# Patient Record
Sex: Female | Born: 1945
Health system: Southern US, Community
[De-identification: ages and names within clinical notes are randomized; demographics above are authoritative.]

## PROBLEM LIST (undated history)

## (undated) DIAGNOSIS — F329 Major depressive disorder, single episode, unspecified: Secondary | ICD-10-CM

## (undated) DIAGNOSIS — E782 Mixed hyperlipidemia: Secondary | ICD-10-CM

## (undated) DIAGNOSIS — H9190 Unspecified hearing loss, unspecified ear: Secondary | ICD-10-CM

## (undated) DIAGNOSIS — R2689 Other abnormalities of gait and mobility: Secondary | ICD-10-CM

## (undated) DIAGNOSIS — H8109 Meniere's disease, unspecified ear: Secondary | ICD-10-CM

## (undated) DIAGNOSIS — L309 Dermatitis, unspecified: Secondary | ICD-10-CM

## (undated) DIAGNOSIS — F419 Anxiety disorder, unspecified: Secondary | ICD-10-CM

## (undated) DIAGNOSIS — R2681 Unsteadiness on feet: Secondary | ICD-10-CM

## (undated) DIAGNOSIS — R0600 Dyspnea, unspecified: Secondary | ICD-10-CM

## (undated) DIAGNOSIS — M810 Age-related osteoporosis without current pathological fracture: Secondary | ICD-10-CM

## (undated) DIAGNOSIS — I10 Essential (primary) hypertension: Secondary | ICD-10-CM

## (undated) DIAGNOSIS — K219 Gastro-esophageal reflux disease without esophagitis: Secondary | ICD-10-CM

## (undated) DIAGNOSIS — Z9621 Cochlear implant status: Secondary | ICD-10-CM

## (undated) DIAGNOSIS — F319 Bipolar disorder, unspecified: Secondary | ICD-10-CM

## (undated) DIAGNOSIS — F32A Depression, unspecified: Secondary | ICD-10-CM

## (undated) DIAGNOSIS — E876 Hypokalemia: Secondary | ICD-10-CM

## (undated) DIAGNOSIS — Z974 Presence of external hearing-aid: Secondary | ICD-10-CM

## (undated) DIAGNOSIS — M1712 Unilateral primary osteoarthritis, left knee: Secondary | ICD-10-CM

## (undated) DIAGNOSIS — D62 Acute posthemorrhagic anemia: Secondary | ICD-10-CM

## (undated) DIAGNOSIS — M1711 Unilateral primary osteoarthritis, right knee: Secondary | ICD-10-CM

## (undated) DIAGNOSIS — M797 Fibromyalgia: Secondary | ICD-10-CM

## (undated) DIAGNOSIS — K5901 Slow transit constipation: Secondary | ICD-10-CM

## (undated) DIAGNOSIS — J189 Pneumonia, unspecified organism: Secondary | ICD-10-CM

## (undated) DIAGNOSIS — R7303 Prediabetes: Secondary | ICD-10-CM

## (undated) DIAGNOSIS — IMO0001 Reserved for inherently not codable concepts without codable children: Secondary | ICD-10-CM

## (undated) DIAGNOSIS — M1990 Unspecified osteoarthritis, unspecified site: Secondary | ICD-10-CM

## (undated) HISTORY — DX: Other abnormalities of gait and mobility: R26.89

## (undated) HISTORY — PX: COCHLEAR IMPLANT: SUR684

## (undated) HISTORY — DX: Anxiety disorder, unspecified: F41.9

## (undated) HISTORY — DX: Major depressive disorder, single episode, unspecified: F32.9

## (undated) HISTORY — DX: Presence of external hearing-aid: Z97.4

## (undated) HISTORY — DX: Depression, unspecified: F32.A

## (undated) HISTORY — DX: Reserved for inherently not codable concepts without codable children: IMO0001

## (undated) HISTORY — DX: Meniere's disease, unspecified ear: H81.09

## (undated) HISTORY — DX: Age-related osteoporosis without current pathological fracture: M81.0

## (undated) HISTORY — DX: Hypokalemia: E87.6

## (undated) HISTORY — DX: Unspecified hearing loss, unspecified ear: H91.90

## (undated) HISTORY — PX: COLONOSCOPY: SHX174

## (undated) HISTORY — DX: Unspecified osteoarthritis, unspecified site: M19.90

## (undated) HISTORY — DX: Unsteadiness on feet: R26.81

## (undated) HISTORY — DX: Acute posthemorrhagic anemia: D62

## (undated) HISTORY — DX: Slow transit constipation: K59.01

## (undated) HISTORY — DX: Gastro-esophageal reflux disease without esophagitis: K21.9

## (undated) HISTORY — DX: Mixed hyperlipidemia: E78.2

## (undated) HISTORY — DX: Essential (primary) hypertension: I10

---

## 1984-10-19 HISTORY — PX: ABDOMINAL HYSTERECTOMY: SHX81

## 1998-02-22 ENCOUNTER — Other Ambulatory Visit: Admission: RE | Admit: 1998-02-22 | Discharge: 1998-02-22 | Payer: Self-pay | Admitting: Family Medicine

## 1998-04-26 ENCOUNTER — Emergency Department (HOSPITAL_COMMUNITY): Admission: EM | Admit: 1998-04-26 | Discharge: 1998-04-26 | Payer: Self-pay | Admitting: Internal Medicine

## 1999-02-24 ENCOUNTER — Other Ambulatory Visit: Admission: RE | Admit: 1999-02-24 | Discharge: 1999-02-24 | Payer: Self-pay | Admitting: Obstetrics and Gynecology

## 1999-05-01 ENCOUNTER — Encounter: Payer: Self-pay | Admitting: Internal Medicine

## 1999-05-01 ENCOUNTER — Inpatient Hospital Stay (HOSPITAL_COMMUNITY): Admission: EM | Admit: 1999-05-01 | Discharge: 1999-05-02 | Payer: Self-pay | Admitting: Internal Medicine

## 2000-07-21 ENCOUNTER — Other Ambulatory Visit: Admission: RE | Admit: 2000-07-21 | Discharge: 2000-07-21 | Payer: Self-pay | Admitting: Obstetrics and Gynecology

## 2002-06-04 ENCOUNTER — Emergency Department (HOSPITAL_COMMUNITY): Admission: EM | Admit: 2002-06-04 | Discharge: 2002-06-04 | Payer: Self-pay | Admitting: Emergency Medicine

## 2002-06-05 ENCOUNTER — Encounter: Payer: Self-pay | Admitting: Family Medicine

## 2002-06-05 ENCOUNTER — Encounter: Payer: Self-pay | Admitting: Emergency Medicine

## 2005-07-15 ENCOUNTER — Encounter: Admission: RE | Admit: 2005-07-15 | Discharge: 2005-07-15 | Payer: Self-pay | Admitting: Rheumatology

## 2005-10-19 HISTORY — PX: OTHER SURGICAL HISTORY: SHX169

## 2006-03-24 ENCOUNTER — Ambulatory Visit: Admission: RE | Admit: 2006-03-24 | Discharge: 2006-03-24 | Payer: Self-pay | Admitting: Urology

## 2006-04-27 ENCOUNTER — Inpatient Hospital Stay (HOSPITAL_COMMUNITY): Admission: RE | Admit: 2006-04-27 | Discharge: 2006-04-29 | Payer: Self-pay | Admitting: Urology

## 2006-05-04 ENCOUNTER — Inpatient Hospital Stay (HOSPITAL_COMMUNITY): Admission: EM | Admit: 2006-05-04 | Discharge: 2006-05-07 | Payer: Self-pay | Admitting: Urology

## 2006-09-22 ENCOUNTER — Ambulatory Visit (HOSPITAL_COMMUNITY): Admission: RE | Admit: 2006-09-22 | Discharge: 2006-09-23 | Payer: Self-pay | Admitting: Otolaryngology

## 2009-01-22 ENCOUNTER — Encounter: Admission: RE | Admit: 2009-01-22 | Discharge: 2009-04-22 | Payer: Self-pay | Admitting: Otolaryngology

## 2009-03-06 ENCOUNTER — Other Ambulatory Visit: Admission: RE | Admit: 2009-03-06 | Discharge: 2009-03-06 | Payer: Self-pay | Admitting: Family Medicine

## 2009-04-02 ENCOUNTER — Encounter: Admission: RE | Admit: 2009-04-02 | Discharge: 2009-04-02 | Payer: Self-pay | Admitting: Neurology

## 2009-04-03 ENCOUNTER — Encounter: Admission: RE | Admit: 2009-04-03 | Discharge: 2009-04-03 | Payer: Self-pay | Admitting: Neurology

## 2009-04-11 ENCOUNTER — Encounter: Admission: RE | Admit: 2009-04-11 | Discharge: 2009-04-11 | Payer: Self-pay | Admitting: Orthopaedic Surgery

## 2009-05-15 ENCOUNTER — Emergency Department (HOSPITAL_COMMUNITY): Admission: EM | Admit: 2009-05-15 | Discharge: 2009-05-15 | Payer: Self-pay | Admitting: Emergency Medicine

## 2009-08-13 ENCOUNTER — Ambulatory Visit (HOSPITAL_BASED_OUTPATIENT_CLINIC_OR_DEPARTMENT_OTHER): Admission: RE | Admit: 2009-08-13 | Discharge: 2009-08-13 | Payer: Self-pay | Admitting: Orthopaedic Surgery

## 2011-01-22 LAB — BASIC METABOLIC PANEL
BUN: 10 mg/dL (ref 6–23)
Calcium: 10.3 mg/dL (ref 8.4–10.5)
Creatinine, Ser: 0.67 mg/dL (ref 0.4–1.2)
GFR calc Af Amer: >60 mL/min (ref >60)
Glucose, Bld: 122 mg/dL — ABNORMAL HIGH (ref 70–99)
Potassium: 4 mEq/L (ref 3.5–5.1)
Sodium: 133 mEq/L — ABNORMAL LOW (ref 135–145)

## 2011-01-25 LAB — DIFFERENTIAL
Eosinophils Relative: 2 % (ref 0–5)
Neutro Abs: 4.3 10*3/uL (ref 1.7–7.7)
Neutrophils Relative %: 55 % (ref 43–77)

## 2011-01-25 LAB — CBC
HCT: 39.3 % (ref 36.0–46.0)
Hemoglobin: 13.4 g/dL (ref 12.0–15.0)
MCHC: 34 g/dL (ref 30.0–36.0)
RDW: 12.1 % (ref 11.5–15.5)

## 2011-01-25 LAB — URINALYSIS, ROUTINE W REFLEX MICROSCOPIC
Bilirubin Urine: NEGATIVE
Glucose, UA: NEGATIVE mg/dL
Hgb urine dipstick: NEGATIVE
Protein, ur: NEGATIVE mg/dL
Specific Gravity, Urine: 1.007 (ref 1.005–1.030)
Urobilinogen, UA: 0.2 mg/dL (ref 0.0–1.0)

## 2011-01-25 LAB — POCT CARDIAC MARKERS: CKMB, poc: 5.9 ng/mL (ref 1.0–8.0)

## 2011-01-25 LAB — POCT I-STAT, CHEM 8
BUN: 5 mg/dL — ABNORMAL LOW (ref 6–23)
Glucose, Bld: 92 mg/dL (ref 70–99)
TCO2: 32 mmol/L (ref 0–100)

## 2011-01-25 LAB — D-DIMER, QUANTITATIVE: D-Dimer, Quant: 0.69 ug/mL-FEU — ABNORMAL HIGH (ref 0.00–0.48)

## 2011-03-06 NOTE — Op Note (Signed)
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   POSTOPERATIVE DIAGNOSIS:  Vaginal vault prolapse with cystocele and  rectocele and stress urinary incontinence.   PROCEDURE:  Transvaginal vault suspension, cystocele repair utilizing dermal  graft, rectocele repair, perineal repair, SPARC sling cystourethropexy,  cystoscopy.   Elizabeth Stone has symptomatic prolapse and mild mixed stress and urge  incontinence.  I want to make certain her sling was loose based upon her  voiding parameters and her high point pressure and mild stress incontinence.   The patient was prepped and draped in the usual fashion.  Extra care was  taken to minimize the risk of compartment syndrome, neuropathy, and DVT.  I  initially examined her and identified the vaginal cuff dimple and placed a 3-  0 Vicryl suture.  I used a T-shaped anterior vaginal wall incision  dissecting just proximal to the dimple up towards the proximal urethra,  leaving enough space for the [facility] sling to be done through a second  incision.  I sharply dissected the pubocervical fascia from the vaginal  epithelium to the white line bilaterally.  I used a total of 20 mL lidocaine  with epinephrine and hemostasis was excellent.  I sharply and then bluntly  dissected back to the ischial spine bilaterally and placed a 0 Ethibond  suture using the Capio device.  I was very happy with the robustness of that  suture placement.  Using a needle, I placed a 2-0 Vicryl in the pelvic  sidewall near to the urethrovesical angle.  I only used four sutures because  she had a fairly short anterior vaginal vault.  On physical examination and  under anesthesia, she had minimal central defect so I did not do a running  anterior repair.  I sutured in place a 10 x 6 graft cut as a trapezoid  utilizing my usual technique.  It was sized very nicely and sewn in place  using the four sutures described above.  I used 3-0 Vicryl to tack the  cephalad medial aspect of the graft to the apex in the midline.  I trimmed  approximately 4-5 mm from the vaginal mucosa bilaterally and closed with  running 2-0 Vicryl suture.  Hemostasis was excellent.   I then performed a SPARC sling cystourethropexy.  I made two 1 cm incisions  approximately 2 cm lateral to the midline and one fingerbreadth above the  pubic symphysis.  I then made a 2 cm incision overlying urethra exposing the  mid urethra.  I used lidocaine and epinephrine.  Hemostasis was excellent.  I made a deep incision  and dissected to the pubic bone bilaterally.  I could  palpate the pubic bone bilaterally.  The flap was nice and thick to prevent  extrusion in the future.  With the bladder empty, I passed the [facility] needles  through the retropubic space along the back of the pubic bone and delivered  them both through the endopelvic fascia.  The [facility] sling was brought up  through the retropubic space and I tied tensioned the sling a little bit  looser than I normally do using a Kelly placed underneath the urethra in the  midline.  I cut below the blue dots and passed irrigation through the  sheath.  I removed the sheath.  The sling was laying nicely and loosely in  the area of the mid urethra.  I closed the anterior vaginal wall with  running 2-0 Vicryl.  One 3-0 Vicryl was used as a reinforcing suture.  The  abdominal incisions were closed with interrupted 4-0 Vicryl and then covered  with Dermabond.   After the cystocele repair and  also after the sling procedure, the patient  was cystoscoped.  There was blue dye from the ureteral orifices bilaterally.  There was no foreign body or needle placed in the bladder.  There was no  bleeding in the bladder or urethra.   I then did the posterior repair.  I placed two Allis clamps on the introitus  posteriorly and removed a small triangle perineal skin.  I used  approximately 10 mL of lidocaine with epinephrine.  I made a midline  incision and all the way out to approximately 1 cm from the vaginal dimple  which was marked with the initial 3-0 Vicryl.  I then sharply dissected the  vaginal mucosa from the rectovaginal fascia bilaterally.  I was very happy  with the mobilization of the rectovaginal fascia.  There\ was little to no  bleeding.   I had double gloved and did a digital rectal examination.  There was no  question that the patient primarily had a site defect of the rectovaginal  fascia at the apex and along the left lateral aspect of the rectum.  With  the help of Dr. __________ and with my finger in the rectum, we placed four  2-0 Vicryl sutures, two deep in the sidewall on the left side and two at the  apex, each lateral to the midline at the vaginal cuff dimples.  We  appropriately brought over the rectovaginal fascia as a flap, almost like a  trap door, and closed it using the former placed sutures.  The rectum was  nicely flattened, not under tension, and she did not need any sort of graft  or imbrication.  Approximately 3 mm of vaginal mucosa was excised  bilaterally.  The vaginal epithelium was closed with running 2-0 Vicryl.  One 0 Vicryl was used for the perineal closure.  The subcuticular 2-0 Vicryl  was used for the perineal skin closure.  The vaginal length was excellent.  There was no ring or narrowing.  A vaginal pack with Estrace cream was  applied.  Estimated blood loss was less than 200 mL.  The catheter was draining urine  at the end of the case.   Leg position was excellent at the end of the case.  Hopefully this procedure will reach our treatment goal.           ______________________________  Martina Sinner, MD  Electronically Signed     SAM/MEDQ  D:  04/27/2006  T:  04/27/2006  Job:  [id number]

## 2011-03-06 NOTE — H&P (Signed)
NAME:  Elizabeth Stone, Elizabeth Stone NO.:  [id number]   MEDICAL RECORD NO.:  [id number]          PATIENT TYPE:  OIB   LOCATION:  5737                         FACILITY:  [facility]   PHYSICIAN:  Elizabeth Stone, M.D.    DATE OF BIRTH:  [birth year]   DATE OF ADMISSION:  09/22/2006  DATE OF DISCHARGE:                              HISTORY & PHYSICAL   CHIEF COMPLAINT:  Profound hearing loss.   HISTORY OF PRESENT ILLNESS:  Elizabeth Stone is a 65 year old white female  here today for a multichannel cochlear implant of her right ear to treat  profound deafness.  Elizabeth Stone developed bilateral Meniere's disease  beginning in 58.  She went on to lose all the hearing in the right ear  and has profound loss in the left ear with less than 40% discrimination.  At last audiometric testing performed on September 20, 2006, showed no  hearing in the right ear, the left ear had an SRT of 90 dd with 36%  discrimination with an MCL of 100 dB.   Elizabeth Stone had been recommended for multichannel cochlear implant in the  past but had had some intervening medical problems including bipolar  disease type 2, predominately depression, mitral valve prolapse, and a  recent urologic procedure for pelvic prolapse.   Elizabeth Stone was struggling with using a high-end behind-the ear-hearing  aide in the left ear and had reached the point where she was a good  candidate for a multichannel cochlear implant.  She had previously  undergone CT scanning of her temporal bones and had a patent cochlea AD.  She had previously received Pneumovax vaccine within the last 5 years.   A preoperative physical examination for preoperative clearance was  performed by Elizabeth Stone on September 04, 2005.  He re-saw her again  recently and cleared her for surgery.   Risks and complications of multichannel cochlear implant were explained  to Elizabeth Stone, questions were invited and answered and inform  consent was signed and  witnessed, she read and signed our 3-page  informed consent form specifically designed for cochlear implant  patients.  She was also given the [place] information packet by the  [employer] and had thoroughly read and understood all  that information.  We had a long preoperative consultation again  involving informed consent and I answered all of Elizabeth Stone's questions.   The procedure is scheduled for September 22, 2006, at 8:30 a.m. at [facility] OR Room #3 under general endotracheal anesthesia.   PAST MEDICAL HISTORY:  Patient recently underwent a pelvic prolapse  procedure.   SERIOUS ILLNESSES INCLUDE:  Bipolar disorder type 2, predominately  depression, mitral valve prolapse, PAT, fibromyalgia, and irritable  bowel syndrome, history of Raynaud's syndrome.   MEDICATIONS INCLUDE:  1. Mirapex 1 mg q.h.s.  2. Cymbalta 120 mg p.o. daily.  3. Lamictal 200 mg q.h.s.  4. Xanax 0.25 mg p.r.n. anxiety.  5. Lunesta 3 mg q.h.s.  6. Clonazepam 0.25 mg q.h.s. p.r.n.  7. Celebrex 200 mg daily.  8. Lodine  400 mg p.o. b.i.d. p.r.n.  9. Ultram ER 100 mg daily.  10.Fosamax 70 mg weekly.  11.Nexium 40 mg daily.  12.Chlorthalidone 50 mg daily.  13.Spironolactone/hydrochlorothiazide 25/25 daily.  14.Potassium 20 mEq b.i.d. to t.i.d.  15.Singulair 10 mg daily.  16.Ambien CR 12.5 mg q.h.s.  17.Aspirin 81 mg daily.  18.Sudafed p.r.n. congestion.   OPERATIONS INCLUDE:  Status post hysterectomy in 1986 and a bladder tact  procedure April 27, 2006, she had had 2 previous ear procedures.   IMMUNIZATIONS:  She had a flu shot October 2007 and a Pneumovax  immunization December 01, 2004.   SOCIAL HISTORY:  She exercises by walking or biking 1 mile a day.  She  quit smoking tobacco in 1994, drinks 2-4 glasses of wine per week.   REVIEW OF SYSTEMS:  She admitted to a single episode of chest pain that  was known to Elizabeth Stone.   PHYSICAL EXAMINATION:  VITAL SIGNS:  Stable.  Weight was  161 pounds.  Pulse is 88 and regular.  HEENT:  Head was normocephalic with bilateral symmetrical facial motion.  No nystagmus.  PERRLA.  External ears and canals were stable.  TMs are  intact, clear and mobile bilaterally.  Nose:  Negative.  Oral cavity,  lips and palate normal.  NECK:  Negative.  CHEST:  Clear.  HEART:  Normal sinus rhythm without murmurs, rubs, or gallops.  ABDOMEN:  Benign.  MUSCULOSKELETAL:  She had some hip pain otherwise, extremities were  within normal limits.  NEUROLOGIC EXAM:  Physiologic.  She has a controlled bipolar disease  with depression.   LABORATORY DATA:  Showed normal EKG.  Chest x-ray showed no active  disease.  CBC and CMET were all within normal limits.  Coag profile was  within normal limits.   IMPRESSION:  1. Profound hearing loss AD secondary to bilateral Meniere's disease      with profound hearing loss in the left ear, struggling to use a BTE      hearing aid AS.  2. Stable bipolar disorder, fibromyalgia, and back pain.  3. Status post recent bladder tact procedure.  4. Mitral valve prolapse, PAT, irritable bowel syndrome, history of      Raynaud's disease and back pain with arthralgias.  I recommended      patient to schedule for a [employer] high-resolution 90K multichannel      cochlear      implant AD on September 22, 2006, in [facility] OR room #3 under      general endotracheal anesthesia.  Again, risks and complications of      the procedure were explained carefully to her, questions were      invited and answered and informed consent was signed and witnessed.           ______________________________  Elizabeth Stone, M.D.     EMK/MEDQ  D:  09/22/2006  T:  09/22/2006  Job:  [id number]   cc:   Elizabeth Stone, M.D.  Elizabeth Stone, M.D.

## 2011-03-06 NOTE — Discharge Summary (Signed)
NAME:  Elizabeth Stone, Elizabeth Stone                  ACCOUNT NO.:  [id number]   MEDICAL RECORD NO.:  [id number]          PATIENT TYPE:  INP   LOCATION:  1508                         FACILITY:  [facility]   PHYSICIAN:  Martina Sinner, MD DATE OF BIRTH:  30-May-1946   DATE OF ADMISSION:  05/04/2006  DATE OF DISCHARGE:  05/07/2006                                 DISCHARGE SUMMARY   ADMISSION DIAGNOSIS:  Nausea and vomiting following pelvic prolapse surgery.   DISCHARGE DIAGNOSIS:  Nausea and vomiting following pelvic prolapse surgery.   Elizabeth Stone was admitted for nausea and vomiting.  She had a prolonged course  in the hospital with a number of complaints.  He nausea and vomiting in my  opinion was probably due to pain medication causing gastritis.  She was  treated with Protonix, clear fluids, and gradually tolerated her diet. She  had a decreased appetite when she was discharged home, but was eating  reasonably well.  She was seen by the nutritionist.  She had a lot of  diffuse pain while in hospital. She had a lot of suprapubic pain.  Some  times she had vaginal pain. Other times she had burning in the urethra. At  other times she had burning in the urethra. At other times she was  completely pain free. Her pain complaints did vary from day to day and at  different times  day. When she was discharged home she was having some pain  with catheterization.  I gave her some Pyridium and asked her to catheterize  twice a day and to check residuals.   She was having trouble with retention while in hospital.  She had had  residuals ranging from 500 to 700 mL.  On a couple of occasions she was  catheterized for greater than one liter. She was difficult to assess and  there was no question she does not have a lot of bladder feeling and she has  a large capacity bladder.  When she was catheterized any vague discomfort  was improved.  I did not treat her with a Foley catheter since she did not  tolerate it in the  past.   On the day of the discharge she was emptying much better. She learned how to  self catheterize herself.  Her residual urine volumes at 10:30 in the  morning on the day of discharge was 150 mL after voiding 300 mL.  About four  to five hours later she voided 650 mL, but did not want to be catheterized.  She was sore.  We bladder scanned for 132 mL.   Vitals were completely normal while in the hospital. Having said that her  blood pressure was a little bit low and has normalized. She was never  tachycardic.  Her hemoglobin, white blood cell count, electrolytes were  always normal. Her serum creatinine was normal.   Elizabeth Stone was discharged home with no heavy bending or lifting for three  months.  She can drive a car and walk as soon as possible.  I discharged her  home on Protonix 59  mg daily for three weeks, Pyridium 100 mg p.o. t.i.d. on  a p.r.n. basis, and Vicodin.   The patient will be called on Monday and I will see her again for a post  void residual on Wednesday.  I have asked her to check residual at least  twice a day or if she feels uncomfortable even if she does have some  urethral burning.           ______________________________  Martina Sinner, MD  Electronically Signed     SAM/MEDQ  D:  05/07/2006  T:  05/08/2006  Job:  [id number]

## 2011-03-06 NOTE — Discharge Summary (Signed)
NAME:  Elizabeth Stone, Elizabeth Stone                  ACCOUNT NO.:  [id number]   MEDICAL RECORD NO.:  [id number]          PATIENT TYPE:  INP   LOCATION:  1403                         FACILITY:  [facility]   PHYSICIAN:  Martina Sinner, MD DATE OF BIRTH:  07-10-1946   DATE OF ADMISSION:  04/27/2006  DATE OF DISCHARGE:  04/29/2006                                 DISCHARGE SUMMARY   PREOPERATIVE DIAGNOSIS:  Vault prolapse with cystocele and rectocele, and  stress incontinence.   DISCHARGE DIAGNOSIS:  Vault prolapse with cystocele and rectocele, and  stress incontinence.   PROCEDURES DONE DURING THIS HOSPITAL STAY:  Vault suspension, with cystocele  repair, rectocele repair, SPARC sling, cystourethropexy, and cystoscopy.   SECONDARY DIAGNOSES:  1.  Fibromyalgia.  2.  Chronic pain.  3.  Chronic narcotic intake.   HISTORY OF PRESENT ILLNESS AND HOSPITAL COURSE:  [name] is a 65 year old  white female who has had vaginal vault prolapse.  She was admitted on April 27, 2006, where she had an uneventful vaginal prolapse repair.  Postoperatively, the patient had significant perineal pain that required IV  narcotic management for two days.  On postop day 1, the patient's Foley  catheter and vaginal packs were removed.  However, the patient failed to  void, with significant bladder scan volumes averaging about 500.  This  required multiple clean intermittent catheterizations, after which a  decision was made to put in the Foley catheter.  On postoperative day 2, the  patient's pain was very well controlled.  She was awake, alert, and  oriented.  She was not hemodynamically unstable.  She did have low blood  pressures.  However, this was considered to be her normal baseline.  Hemoglobin was checked, and this was stable.  The patient was given a bolus.  All during that time, her mental status was intact.  On postop day 2, the  patient was then discharged home.  She was afebrile.  Her vital signs were  stable.   She was awake, alert, and oriented.  She has good breath sounds  bilaterally.  Abdomen was soft, nontender.  Her wounds were clean, dry, and  intact, with no evidence of drainage.  Her Foley catheter was draining clear  urine.  Neurological exam was nonfocal.  The patient was then discharged  home on her home medications.  She was given a prescription of Percocet  5/325 mg 2 tabs p.o. q.6 h. p.r.n. for pain.  She was given a prescription  of Flomax 0.4 mg p.o. at bedtime.  She was also given a prescription of  Colace 100 mg p.o. b.i.d., and ciprofloxacin 500 mg p.o. b.i.d. for 7 days.  The patient will go home  on a leg bag.  She is to follow up with Dr. Sherron Monday on Monday May 03, 2006, at which time her Foley catheter will be taken out, and she will be  given a voiding trial.  Should the patient have any fever, chills, abdominal  pain, or any discomfort or questions or concerns, she is to contact us or  come to  the emergency room.     ______________________________  Kathryne Eriksson, M.D.      Martina Sinner, MD  Electronically Signed    SK/MEDQ  D:  04/29/2006  T:  04/29/2006  Job:  [id number]

## 2011-03-06 NOTE — H&P (Signed)
NAME:  Elizabeth Stone, Elizabeth Stone                  ACCOUNT NO.:  [id number]   MEDICAL RECORD NO.:  [id number]          PATIENT TYPE:  INP   LOCATION:  1403                         FACILITY:  [facility]   PHYSICIAN:  Martina Sinner, MD DATE OF BIRTH:  18-Aug-1946   DATE OF ADMISSION:  04/27/2006  DATE OF DISCHARGE:                                HISTORY & PHYSICAL   ADMISSION DIAGNOSIS:  Vaginal vault prolapse with cystocele and rectocele;  admit for postoperative pain.   HISTORY OF PRESENT ILLNESS:  Elizabeth Stone has vaginal vault prolapse.  She had  an uneventful vaginal prolapse repair on April 27, 2006 and that surgery has  been dictated.  She does have fibromyalgia and is on chronic pain medication  including morphine 30 mg oral twice a day.  The first day following her  surgery, she was complaining of vaginal discomfort.  She and her husband had  expressed that she would like to stay an extra day if possible.  She did not  sleep well through the night due to her pain.  She also has her fibromyalgia  pain which tends to be in the right and left lower inguinal areas and lower  quadrants and radiates into her legs.   Her postoperative vitals were normal.  Her temperature was 98.5.  Her  electrolytes were normal.  Her hemoglobin was 12.2.   PHYSICAL EXAMINATION:  GENERAL:  Alert, but seems to be in discomfort.  ABDOMEN:  No distention.  Scant bowel sounds.  Suprapubic area minimally  tender.  GU:  Pack in place and dry.  Foley draining well.   PLAN:  Elizabeth Stone was admitted for observation to manage her pain.  Her  vaginal pack and Foley catheter will be removed today.  She will have a  voiding trial today.  Hopefully, her pain will settle down and she will be  discharged as early as tomorrow.           ______________________________  Martina Sinner, MD  Electronically Signed     SAM/MEDQ  D:  04/28/2006  T:  04/28/2006  Job:  [id number]

## 2011-03-06 NOTE — Op Note (Signed)
NAME:  Elizabeth Stone, Elizabeth Stone NO.:  [id number]   MEDICAL RECORD NO.:  [id number]          PATIENT TYPE:  OIB   LOCATION:  5737                         FACILITY:  [facility]   PHYSICIAN:  Carolan Shiver, M.D.    DATE OF BIRTH:  [birth year]   DATE OF PROCEDURE:  09/22/2006  DATE OF DISCHARGE:                               OPERATIVE REPORT   JUSTIFICATION FOR PROCEDURE:  Janett Kamath is a 65 year old white  female with bilateral ears Meniere's disease who I have been treating  since 1987.  Ms. Azure progressed to losing all the hearing in her right  ear and has 36% discrimination in her left ear in the best stated  condition.  She had had some other medical problems including bipolar  type 2 illness mostly manifested as depression and a recent pelvic  procedure for a bladder tack.  She has other chronic medical problems  and finally stabilized and was recommended for a Clarion multichannel  high res 90K cochlear implant, right ear.  The risks and complications  of cochlear implantation were explained to her and to her husband.  Questions were invited and answered and a three page written informed  consent form was read, signed, and witnessed.  All questions were  invited and answered.  The procedure was scheduled for September 22, 2006,  at 8:30 a.m. at [facility] main OR room #3.   JUSTIFICATION FOR INPATIENT SETTING:  Patient's age and need for general  endotracheal anesthesia.   JUSTIFICATION FOR OVERNIGHT STAY:  23 hours of observation status post  cochlear implant AD.   PREOPERATIVE DIAGNOSIS:  Profound deafness, right ear.   POSTOPERATIVE DIAGNOSIS:  Profound deafness, right ear.   OPERATION:  Clarion high res 90K multichannel cochlear implant AD (this  is a high focus 1J electrode).   SURGEON:  Carolan Shiver, M.D.   ANESTHESIA:  General endotracheal anesthesia, Dr. Sharee Holster.   COMPLICATIONS:  None.   DISCHARGE STATUS:  Stable.   SUMMARY OF  REPORT:  After the patient was taken to the operating room,  she was placed in the supine position.  An IV had been begun in the  holding area, general IV induction was performed under the guidance of  Dr. Jacklynn Bue, and the patient was orally intubated without difficulty.  Eyelids were taped shut.  She was properly positioned and monitored.  Elbows and ankles were padded with foam rubber.  A Foley catheter was  inserted.  A small amount of hair was clipped on the right parietal  skull area in cochlear implant fashion.  Several wire needle electrodes  were then inserted into the right orbicularis, oris, and oculi muscles  and ground and reference electrodes were inserted into the suprasternal  notch area and connected to a facial nerve monitor pre-amplifier.  A  cochlear implant incision was then marked with a marking pen and the  incision area was infiltrated with 9 mL of 1% Xylocaine with 1:100,000  epinephrine.  The patient's right ear and hemi-face was then prepped  with Betadine and draped in a  standard fashion for a cochlear implant,  AD.  A time-out was performed.   A right inverted J cochlear implant incision was then made with a 15  blade and carried down through skin and subcutaneous tissue.  A Palva  flap was elevated.  Using a template, the BTE and internal receiver  areas were previously marked and tattooed.  A Palva flap was then  elevated.  Self-retaining retractors were placed.  The mastoid cortex  was then removed with cutting burs using continuous suction irrigation  and the mastoid was found to be poorly pneumatized in the lateral one-  half, normally pneumatized in the medial one-half.  The antrum was  opened.  The incus was found in the normal position in the fossa  incudis.  The patient's sigmoid sinus was found to be laterally  displaced.  The facial recess was then opened with cutting and diamond  burs.  The chorda tympani nerve was preserved during this portion of  the  procedure.  The facial nerve was not exposed.  After opening the facial  recess in an oval cochlear implant fashion, the facial nerve was  stimulated in the horizontal portion and stimulated at 0.6 mA full face.   Examination of the middle ear through the facial recess revealed a  normal incus and stapes.  The jugular bulb was high riding and was  abutting against the inferior aspect of the cochlea.  The round window  nitch was rather constructed.  The cochleostomy was begun with a 2 mm  diamond bur and drilled down to eggshell the scala tympani.  The area  was then packed with a 1 x 1 cm cottonoid with saline.   Attention was then turned to the temporal parietal scalp.  The  previously tattooed area for the internal receiver was identified and a  well was drilled in the bone.  Two pairs of holes were drilled on either  side of the well so as to tie down the internal receiver.  A trough was  drilled between the well and the posterior superior portion of the  mastoid cavity.  All edges were smoothed with diamond burs.  The mastoid  cavity itself was also smoothed and polished with diamond burs and all  edges were smoothed.  The area was then copiously irrigated with  bacitracin containing saline.  All bleeding was controlled.  Gowns and  gloves were changed.   A Clarion multichannel high-res 90K cochlear implant, serial number  [id number], model C1-1400-01, with a high focus 1J electrode was then opened  under saline and inspected under the microscope. The clamp was found to  be intact.   The cochleostomy was then further opened with 1 and 2 mm diamond burs  and the scala tympani was well identified.  The cochleostomy was opened  large enough to insert a cochleostomy sizer.  The cochlear implant  internal receiver was then taken and placed in the field.  The titanium  case was placed in the bony well and the gold antenna was placed beneath the skin flap.  The electrode array was  placed in the trough.  An  inserter was then lubricated with saline and attached to the plastic  insertion tool.  Great care was taken to make sure that the collar of  the plastic was abutting against the collar of the insertion tool.  The  slot was angled at 10 o'clock.  The insertion guide and insertion tool  were then taken and the insertion  guide was inserted into the  cochleostia to the first turn of the cochlea.  The implant was then  gently inserted without difficulty in one smooth pass.  The hook portion  of the electrode array was left at cochleostomy, the electrode detached  from the insertion tool without any difficulty.  The cochleostomy was  then packed with fascial squares and rectangles and the facial recess  was packed with a muscle plug.  This was then packed with a Gelfoam disk  and the extra electrode array was coiled into the mastoid cavity.  This  was stabilized with two large rectangles of Gelfoam.  The Palva flap was  then sutured over the electrode array to maintain its position.  No  sutures were used in the edge of the mastoid cortex or the trough.   A separate stab incision was then made in the hairline inferiorly and a  #7 Jackson-Pratt suction drain was placed posterior to the implant.  This was sutured into place with a 2-0 silk suture and the suction was  connected to grenade suction.  The site was then copiously irrigated  again with bacitracin containing saline.  The flap was then closed in  two layers using interrupted inverted 3-0 Monocryls for the subcutaneous  layer and the skin was closed with the same suture.  Bacitracin ointment  was applied.  The area was then dressed with bacitracin ointment.  The  skin was then covered with Adaptic and the ear was padded with Telfa and  cotton. A standard adult Glasscock mastoid dressing was applied loosely  in the standard fashion.  Prior to waking the patient, an intraoperative  x-ray was taken and the  electrode array was found to be in good position  within the cochlea.   The patient's Foley catheter was removed.  She was awakened, extubated  and transferred to her hospital bed.  She appeared to tolerate the  general endotracheal anesthesia and the procedures well and left the  operating room in stable condition.   Total fluids 2000 mL. Estimated blood loss 30 mL.  Total urine output  400 mL.  Sponge, needle, and cotton ball counts were correct at the  termination of procedure.  No specimens were sent to pathology.  The  patient received Ancef 4 grams IV and Zofran 4 mg IV at the beginning  and end of the procedure and Decadron 10 mg IV.   Ms. Mcenroe will be admitted to the 23-hour recovery care unit for IV  hydration, pain control, and 23 hours of observation.  If stable  overnight, she will be discharged on September 23, 2006, home with her  husband and will be instructed to return to my office on September 30, 2006, at 1:50 p.m.  DISCHARGE MEDICATIONS:  Ceftin 500 mg p.o. b.i.d. x10 days with food,  Vicodin 1 p.o. q.6h. p.r.n. pain, 30 tabs with no refill, Phenergan  suppositories 25 mg, number 2, 1 p.r. q.6h. p.r.n. nausea, and Valium 5  mg, number 30, 1 p.o. t.i.d. p.r.n. vertigo.   She is to keep her elevated, avoid aspirin or aspirin products, and call  [phone] for any postoperative problems.  She will be given both verbal  and written instructions.  External sound processor hook up will be in  approximately three weeks.   SUMMARY OF PROCEDURE:  The patient underwent an uncomplicated implant,  Clarion high-res 90K cochlear implant with a high focus 1J electrode,  full length insertion was accomplished.  The J  hook area was at the  cochleostomy level and intraoperative x-ray showed proper placement of  the electrode array.  The facial nerve stimulated at 0.6 mA at the  horizontal portion.  The facial nerve was not exposed, the chorda  tympani nerve was preserved.  The  jugular bulb was adjacent to the round  window nitch and was a high riding jugular bulb.  The implant was a high-  res 90K EDC implant, model number C I-1400-01 with a high focus 1J  electrode, serial number [id number].           ______________________________  Carolan Shiver, M.D.     EMK/MEDQ  D:  09/22/2006  T:  09/22/2006  Job:  [id number]

## 2011-03-06 NOTE — H&P (Signed)
NAME:  Elizabeth Stone, Elizabeth Stone NO.:  [id number]   MEDICAL RECORD NO.:  [id number]            PATIENT TYPE:   LOCATION:                                 FACILITY:   PHYSICIAN:  Martina Sinner, MD      DATE OF BIRTH:   DATE OF ADMISSION:  DATE OF DISCHARGE:                                HISTORY & PHYSICAL   CHIEF COMPLAINT:  Nausea and vomiting one week postoperatively (possible  gastritis).   HISTORY OF PRESENT ILLNESS:  Elizabeth Stone underwent a vaginal vault prolapse  repair one week ago.  She was in the hospital for a couple of days after  surgery with a failed trial of voiding and increased vaginal pain.  She  eventually did well and went home uneventfully.  Yesterday I saw her in  clinic and she underwent a fill and pull.  Her initial residual was  approximately 100 mL.  In the middle of the night she had vaginal bleeding  which alarmed her.  She saw one of my partners who reassured her.  This  morning she had severe nausea and vomiting with old food particles.  She  does have Meniere's disease and used to throw up a lot due to this.  The  vaginal bleeding has settled down and she is currently only spotting.   From her fibromyalgia she always has right and left lower quadrant  discomfort that radiates into the groins.  Separate from this, she is having  pain in the vagina which is almost knife-like and very sharp.  She has  complained of this since her surgery.  She is having bowel movements and  passing gas.  She is not having a fever.  She was having some hiccups this  morning and has had hiccups several times in the past.  She is not short of  breath or running a fever.   PAST HISTORY:  1.  Pelvic organ prolapse.  2.  Deafness secondary to chronic Meniere's syndrome.  3.  Fibromyalgia.   MEDICATIONS:  She is on a long list of medications including Lamictal,  Zelnorm, aspirin, Sudafed, potassium, Fosamax, vitamin C, Cymbalta,  clonazepam, Ambien, Hygroton,  Aldactazide, neuroplexus, and she does take  morphine 30 mg at least once daily for her fibromyalgia.   ALLERGIES:  LATEX, TAPE.   FAMILY HISTORY:  Negative, noncontributory.   SOCIAL HISTORY:  Lives here in town with her husband.   REVIEW OF SYSTEMS:  The rest of the review of systems is negative.  She does  not feel any vaginal bulging.  She is voiding subjectively well at home  through the night.   PHYSICAL EXAMINATION:  GENERAL:  Elizabeth Stone looks a little bit dehydrated  and feeling nauseated.  VITAL SIGNS: Temperature 98.6, blood pressure 136/79, pulse 77.  CARDIOVASCULAR:  Heart sounds normal.  SKIN:  Warm and dry.  RESPIRATORY:  Chest clear.  Breathing quietly.  ABDOMEN:  Soft and flat.  Good bowel sounds.  No peritoneal signs.  No  distention.  Minimally tender near the [facility] sling incisions  which is not  out of the ordinary.  GU:  The vaginal pad is dry with no blood.  NEUROLOGIC:  Motor.  The patient able to walk on her own.  Normal sensation  to abdomen and perineal area.  SKIN:  Two quarter-size bruises near her sacrum, likely due to the lithotomy  position during her surgery.  LYMPHATICS:  No palpable inguinal nodes.   LABORATORY DATA:  Today she underwent a number of tests which  personally  reviewed.  Urinalysis:  15-50 wbc's, 16-50 rbc's.  No bacteria. Urine is  sent for culture.   Uroflometry:  She voided 38 mL with maximum flow of 9 mL per second.   Bladder scan:  Bladder scan residual was 184 mL.  She does have a tendency  not to empty efficiently.   ASSESSMENT:  Elizabeth Stone, one week following a vaginal vault suspension is  having nausea and vomiting.  Yesterday she had told me that Darvocet was  troubling her stomach.  She only took one.  My working diagnosis that she  likely has gastritis secondary to medication.  Her nausea and vomiting may  also be from her Meniere's disease.  I felt based upon her vaginal  examination yesterday and her abdominal  examination today and re-reviewing  her operative course, I did not think the surgery was directly related to  her nausea and vomiting.  I do not believe there is an injured bowel.  I am  not going to get a CT scan at this point in time.  I will get some  electrolytes including a basic metabolic panel and CBC before I send her  over to the hospital.  I am going to give her a bolus of fluid and hydrate  her and treat her conservatively with Phenergan, Dilaudid, and  ciprofloxacin.  I have reordered some of her home medication.  Hopefully,  her symptoms will dramatically improve over the next 24 to 48 hours.           ______________________________  Martina Sinner, MD  Electronically Signed     SAM/MEDQ  D:  05/04/2006  T:  05/04/2006  Job:  [id number]

## 2011-03-06 NOTE — Discharge Summary (Signed)
NAME:  Elizabeth Stone, STRIDER NO.:  [id number]   MEDICAL RECORD NO.:  [id number]          PATIENT TYPE:  OIB   LOCATION:  5737                         FACILITY:  [facility]   PHYSICIAN:  Carolan Shiver, M.D.    DATE OF BIRTH:  02-06-1946   DATE OF ADMISSION:  09/22/2006  DATE OF DISCHARGE:  09/23/2006                               DISCHARGE SUMMARY   ADMISSION DIAGNOSIS:  Profound deafness.   DISCHARGE DIAGNOSIS:  Profound deafness.   OPERATION:  [employer] Clarion high resolution 90K cochlear  implant right ear.   SURGEON:  Carolan Shiver, M.D.   ANESTHESIA:  General endotracheal, Dr. Jacklynn Bue   COMPLICATIONS:  None.   DISCHARGE STATUS:  Stable.   SUMMARY OF HOSPITALIZATION:  [name] is a very pleasant 65-year-  old white female with profound deafness, struggling to wear a left  behind the ear hearing aid.  She had lost her hearing secondary to  bilateral Meniere's disease.  I had been following her since 35.  She  originally was refractory to medical therapy.  She went on to develop  total deafness in her right ear and near complete deafness in her left  ear with less than 40% discrimination in the left ear in the best aided  condition.  Because of this she was recommended for a multichannel  cochlear implant AD.  Risks and complications of the cochlear implant  were discussed with Mrs. Kervin and her husband quite extensively over  the last several years. Informed consent was then provided and signed.  She signed our three page written informed consent form.  The consent  form is in writing because the patient's are able to read but not able  to hear.  Informed consent was signed and witnessed.  She had been  supplied with the [place] information packet produced by the [employer].  She understood that there were no guarantees that  she would be able to hear with the multichannel cochlear implant and  that there were low, middle and  high performers.  She understood that  her implant would be activated in three weeks postoperatively and that  it would take at least 12 to 24 months for brain training in order for  her to learn how to use the cochlear implant.  She understood all of  these risks and the risks of operation.  She understood that there were  no guarantees and wanted to proceed with cochlear implantation of her  right ear which is the poorer hearing ear.  Previous CT scanning of her  temporal bones documented a patent cochlea.  She had received Pneumovax  vaccine in 2006 and had previously undergone electronystagmography  testing.  Promontory testing was not performed.  She was told that she  would be able to wear her hearing aid in the left ear.   On the morning of September 22, 2006 she was taken to [facility] operating  room #3 and underwent an uncomplicated right Clarion high resolution 90K  multichannel cochlear implant.  This is an implant with  a 1J electrode.  The patient was found to have a normal sized facial reset.  The chorda  tympani nerve was preserved during the procedure.  She was found to have  a very high riding superior lead displaced right jugular bulb; however a  cochleostomy was possible as only a portion of the round window niche  was obliterated by the high riding jugular bulb.  A full length  insertion was accomplished in one smooth insertion.  The J hook portion  of the electrode was left at the cochleostomy.  The cochleostomy was  obliterated with fascia and muscle.  The ICS receiver was sutured to the  bone with a 5-0 Nurolon suture.   The patient had an uncomplicated afebrile postoperative course.  She  awoke in the recovery room without any vertigo, nausea or vomiting and  had intact facial function.  She was transferred from the PACU to 5700  room 37 where she had an uncomplicated postoperative course.  She did  require some nasal 02 and during the evening maintained  saturations  above 90.  She complained of some burning pain in the incision area.  She had less than 50 mL of output in the Hemovac.   By the first postoperative morning, September 22, 2006 she was awake,  alert, afebrile and stable.  She had no vertigo, nausea or vomiting.  Facial function was intact.  The right JP drain was removed.  There was  no evidence of any hematoma or excessive bruising.  The cochlear implant  incision was prepped with alcohol and Betadine, dermabonded and Steri-  Stripped.  Drain site was bandaged.  She was eating breakfast.  Again,  she was awake, alert and stable.   Mrs. Mesa was recommended for discharge on the morning of September 23, 2006 with her husband.  She was instructed to return to my office on  September 30, 2006 for followup.  She understands that her external  hardware will be applied in approximately three weeks if she has  uncomplicated hearing.  She is to follow a regular diet, keep her head  elevated on two pillows and avoid water exposure of her right scalp for  the next five days.   DISCHARGE MEDICATIONS:  Include the following:  1. Ceftin 500 mg p.o. t.i.d. times 10 days with food.  2. Vicodin #30 1 p.o. q.6 hours p.r.n. pain with 1 refill.  3. Phenergan 45 mg p.o. q.6 hours p.r.n. nausea.  4. Valium 5 mg p.o. t.i.d. p.r.n. vertigo 30 plus 1 refill.   She is to continue all of her other home medications which include:  1. Cymbalta 100 mg q a.m.  2. Potassium 20 mEq t.i.d.  3. Spironolactone/hydrochlorothiazide 25/25 mg b.i.d.  4. Clonazepam 0.25 mg daily p.r.n.  5. Nexium 30 mg p.r.n.  6. Hygroton 50 mg p.o. daily.  7. Lunesta 3 mg q.h.s.  8. Lamictal 300 mg p.o. q.h.s.  9. Lodine 400 mg b.i.d. p.r.n.  10.Vitamin C 500 mg daily.  11.Calcium 1200 mg daily.  12.Senokot p.r.n. daily.  13.Milk of magnesia 30 mL p.r.n.   ALLERGIES:  The patient is known to be allergic to LATEX but NO  MEDICATIONS.  She is to follow quiet indoor  activity, avoid heavy lifting or straining  greater than 20 pounds and call [phone] for any postoperative problems  related to the procedure.  She understands that there will be  approximately a week of some mild disequilibrium.   ADMISSION LABORATORY DATA:  Showed hemoglobin of 14.5, hematocrit of  43.2, white blood cell count of 8,000.  She had a sodium of 133,  potassium of 4.3, chloride of 97, CO2 28, calcium 10.6.  This was mildly  elevated.  PT was 13.6 seconds.  PTT 32 seconds.  INR 1.0.  Chest x-ray  PA and lateral on September 17, 2006 showed no active disease.  The chest  x-ray was performed at [facility].  EKG showed a normal  sinus rhythm with a rate of 78.   There were no pathologic specimens from the procedure.  During  hospitalization, the patient was in a short stay area,  [place] OR  holding area, OR [place] operating room #3, the PACU and 5737.           ______________________________  Carolan Shiver, M.D.     EMK/MEDQ  D:  09/23/2006  T:  09/24/2006  Job:  [id number]   cc:   Molly Maduro L. Foy Guadalajara, M.D.  Aundra Dubin, M.D.

## 2011-10-29 ENCOUNTER — Encounter: Payer: Self-pay | Admitting: Gastroenterology

## 2012-07-21 ENCOUNTER — Encounter: Payer: Self-pay | Admitting: Gastroenterology

## 2012-07-27 ENCOUNTER — Encounter: Payer: Self-pay | Admitting: Gastroenterology

## 2012-08-25 ENCOUNTER — Ambulatory Visit (AMBULATORY_SURGERY_CENTER): Payer: Medicare Other | Admitting: *Deleted

## 2012-08-25 VITALS — Ht 63.0 in | Wt 152.8 lb

## 2012-08-25 DIAGNOSIS — Z1211 Encounter for screening for malignant neoplasm of colon: Secondary | ICD-10-CM

## 2012-08-25 MED ORDER — NA SULFATE-K SULFATE-MG SULF 17.5-3.13-1.6 GM/177ML PO SOLN: 1.0000 | Freq: Once | ORAL | Status: DC

## 2012-08-25 NOTE — Progress Notes (Signed)
No allergies to eggs or soy products 

## 2012-09-08 ENCOUNTER — Encounter: Payer: Self-pay | Admitting: Gastroenterology

## 2012-09-08 ENCOUNTER — Ambulatory Visit (AMBULATORY_SURGERY_CENTER): Payer: Medicare Other | Admitting: Gastroenterology

## 2012-09-08 VITALS — BP 99/61 | HR 79 | Temp 98.6°F | Resp 9 | Ht 63.0 in | Wt 152.0 lb

## 2012-09-08 DIAGNOSIS — D126 Benign neoplasm of colon, unspecified: Secondary | ICD-10-CM

## 2012-09-08 DIAGNOSIS — Z1211 Encounter for screening for malignant neoplasm of colon: Secondary | ICD-10-CM

## 2012-09-08 MED ORDER — SODIUM CHLORIDE 0.9 % IV SOLN: 500.0000 mL | INTRAVENOUS | Status: DC

## 2012-09-08 NOTE — Op Note (Signed)
[facility] [address] [place], [address]   COLONOSCOPY PROCEDURE REPORT  PATIENT: Elizabeth Stone, Elizabeth Stone  MR#: [id number] BIRTHDATE: April 09, 1946 , 66  yrs. old GENDER: Female ENDOSCOPIST: Louis Meckel, MD REFERRED QM:VHQION Foy Guadalajara, M.D. PROCEDURE DATE:  09/08/2012 PROCEDURE:   Colonoscopy with snare polypectomy and Colonoscopy with cold biopsy polypectomy ASA CLASS:   Class II INDICATIONS:average risk screening. MEDICATIONS: MAC sedation, administered by CRNA and propofol (Diprivan) 250mg  IV  DESCRIPTION OF PROCEDURE:   After the risks benefits and alternatives of the procedure were thoroughly explained, informed consent was obtained.  A digital rectal exam revealed no abnormalities of the rectum.   The LB [id number]  endoscope was introduced through the anus and advanced to the cecum, which was identified by both the appendix and ileocecal valve. No adverse events experienced.   The quality of the prep was Suprep good  The instrument was then slowly withdrawn as the colon was fully examined.      COLON FINDINGS: Two sessile polyps measuring 2 mm in size were found in the rectum.  A polypectomy was performed with cold forceps. The colon mucosa was otherwise normal.   A flat polyp was found in the ascending colon.  A polypectomy was performed with a cold snare.  The resection was complete and the polyp tissue was completely retrieved.  Retroflexed views revealed no abnormalities. The time to cecum=4 minutes 55 seconds.  Withdrawal time=9 minutes 18 seconds.  The scope was withdrawn and the procedure completed. COMPLICATIONS: There were no complications.  ENDOSCOPIC IMPRESSION: 1.    colon polyps 2.   The colon mucosa was otherwise normal  RECOMMENDATIONS: If the polyp(s) removed today are proven to be adenomatous (pre-cancerous) polyps, you will need a repeat colonoscopy in 5 years.  Otherwise you should continue to follow colorectal  cancer screening guidelines for "routine risk" patients with colonoscopy in 10 years.  You will receive a letter within 1-2 weeks with the results of your biopsy as well as final recommendations.  Please call my office if you have not received a letter after 3 weeks.   eSigned:  Louis Meckel, MD 09/08/2012 9:28 AM   cc:

## 2012-09-08 NOTE — Patient Instructions (Addendum)

## 2012-09-08 NOTE — Progress Notes (Signed)
Patient did not experience any of the following events: a burn prior to discharge; a fall within the facility; wrong site/side/patient/procedure/implant event; or a hospital transfer or hospital admission upon discharge from the facility. (G8907) Patient did not have preoperative order for IV antibiotic SSI prophylaxis. (G8918)  

## 2012-09-09 ENCOUNTER — Telehealth: Payer: Self-pay | Admitting: *Deleted

## 2012-09-09 NOTE — Telephone Encounter (Signed)
Left message on number given in admitting yesterday. [email or web address] 

## 2012-09-19 ENCOUNTER — Encounter: Payer: Self-pay | Admitting: Gastroenterology

## 2012-10-19 HISTORY — PX: HAMMER TOE SURGERY: SHX385

## 2013-02-28 ENCOUNTER — Other Ambulatory Visit: Payer: Self-pay | Admitting: Neurology

## 2013-02-28 DIAGNOSIS — R42 Dizziness and giddiness: Secondary | ICD-10-CM

## 2013-02-28 DIAGNOSIS — R269 Unspecified abnormalities of gait and mobility: Secondary | ICD-10-CM

## 2013-03-08 ENCOUNTER — Other Ambulatory Visit: Payer: Medicare Other

## 2013-03-08 ENCOUNTER — Ambulatory Visit
Admission: RE | Admit: 2013-03-08 | Discharge: 2013-03-08 | Disposition: A | Discharge status: Home / Self Care | Payer: Medicare Other | Source: Ambulatory Visit | Attending: Neurology | Admitting: Neurology

## 2013-03-08 DIAGNOSIS — R269 Unspecified abnormalities of gait and mobility: Secondary | ICD-10-CM

## 2013-03-08 DIAGNOSIS — R42 Dizziness and giddiness: Secondary | ICD-10-CM

## 2013-03-08 MED ORDER — IOHEXOL 350 MG/ML SOLN
100.0000 mL | Freq: Once | INTRAVENOUS | Status: AC | PRN
  Administered 2013-03-08: 100 mL via INTRAVENOUS

## 2013-08-21 ENCOUNTER — Other Ambulatory Visit: Payer: Self-pay | Admitting: Dermatology

## 2013-09-26 ENCOUNTER — Encounter: Payer: Self-pay | Admitting: Cardiology

## 2013-09-26 DIAGNOSIS — M1990 Unspecified osteoarthritis, unspecified site: Secondary | ICD-10-CM | POA: Insufficient documentation

## 2013-09-26 DIAGNOSIS — H8109 Meniere's disease, unspecified ear: Secondary | ICD-10-CM | POA: Insufficient documentation

## 2013-09-26 DIAGNOSIS — K219 Gastro-esophageal reflux disease without esophagitis: Secondary | ICD-10-CM | POA: Insufficient documentation

## 2013-09-26 DIAGNOSIS — M810 Age-related osteoporosis without current pathological fracture: Secondary | ICD-10-CM | POA: Insufficient documentation

## 2013-09-26 DIAGNOSIS — F329 Major depressive disorder, single episode, unspecified: Secondary | ICD-10-CM | POA: Insufficient documentation

## 2013-09-26 DIAGNOSIS — F419 Anxiety disorder, unspecified: Secondary | ICD-10-CM | POA: Insufficient documentation

## 2013-09-26 DIAGNOSIS — H9190 Unspecified hearing loss, unspecified ear: Secondary | ICD-10-CM | POA: Insufficient documentation

## 2013-09-26 DIAGNOSIS — F32A Depression, unspecified: Secondary | ICD-10-CM | POA: Insufficient documentation

## 2013-09-27 ENCOUNTER — Ambulatory Visit (INDEPENDENT_AMBULATORY_CARE_PROVIDER_SITE_OTHER): Payer: Medicare Other | Admitting: Cardiology

## 2013-09-27 ENCOUNTER — Encounter: Payer: Self-pay | Admitting: Cardiology

## 2013-09-27 VITALS — BP 126/76 | HR 89 | Ht 63.0 in

## 2013-09-27 DIAGNOSIS — E782 Mixed hyperlipidemia: Secondary | ICD-10-CM | POA: Insufficient documentation

## 2013-09-27 DIAGNOSIS — I10 Essential (primary) hypertension: Secondary | ICD-10-CM

## 2013-09-27 DIAGNOSIS — Z8249 Family history of ischemic heart disease and other diseases of the circulatory system: Secondary | ICD-10-CM

## 2013-09-27 DIAGNOSIS — F4321 Adjustment disorder with depressed mood: Secondary | ICD-10-CM

## 2013-09-27 HISTORY — DX: Mixed hyperlipidemia: E78.2

## 2013-09-27 NOTE — Patient Instructions (Signed)
Your physician recommends that you continue on your current medications as directed. Please refer to the Current Medication list given to you today.  Your physician wants you to follow-up in: 1 year with Dr. Skains. You will receive a reminder letter in the mail two months in advance. If you don't receive a letter, please call our office to schedule the follow-up appointment.  

## 2013-09-27 NOTE — Progress Notes (Signed)
[address] [place], [place]  [address] Phone: [phone] Fax:  [phone]  Date:  09/27/2013   ID:  Elizabeth Stone, Elizabeth Stone 1946-10-01, MRN [id number]  PCP:  Lenora Boys, MD   History of Present Illness: Elizabeth Stone is a 67 y.o. female with hypertension, hyperlipidemia here for follow up with family history of heart disease. She had stress test, ETT in 12/12,  which was low risk, , no ECG changes. Mother had a few MI's had pacer. Father had MI 20's. Pacer as well.   Able to work on treadmill, bike, step class. With trainer. One prior stress test there was a "problem" years ago. Sounds like she may have had an SVT that autoconverted. Dr. Fraser Din has seen her in the past and stated that she did not have mitral valve prolapse.  Unfortunately, lost her husband in August of 2014 due to a massive stroke. The night before, she states that they [place] They have not done that for 10 years. They have a wonderful time.  Hyperlipidemia-I have reviewed her lab work. See below.   Wt Readings from Last 3 Encounters:  09/08/12 152 lb (68.947 kg)  08/25/12 152 lb 12.8 oz (69.31 kg)     Past Medical History  Diagnosis Date  . Arthritis   . Anxiety   . Depression   . GERD (gastroesophageal reflux disease)   . Meniere disease     takes HCTZ for her inner ear problem  . Osteoporosis   . Hearing impaired     has cochlear implant    Past Surgical History  Procedure Laterality Date  . Abdominal hysterectomy    . Bladder tack    . Cochlear implant      2007    Current Outpatient Prescriptions  Medication Sig Dispense Refill  . aspirin 81 MG tablet Take 81 mg by mouth daily.      Marland Kitchen buPROPion (WELLBUTRIN XL) 300 MG 24 hr tablet Daily.      Marland Kitchen CALCIUM PO Take 1 tablet by mouth 2 (two) times daily.      . CELEBREX 200 MG capsule Daily.      . chlorthalidone (HYGROTON) 50 MG tablet Daily.      . clonazePAM (KLONOPIN)  1 MG tablet 0.25mg  2 tablets at bedtime      . CYMBALTA 60 MG capsule Daily.      . hydrochlorothiazide (HYDRODIURIL) 25 MG tablet Daily.      . Hydrocodone-Acetaminophen (VICODIN PO) Take 1 tablet by mouth 4 (four) times daily as needed.      . Hydrocodone-Homatropine (HYDROMET PO) Take 5 mLs by mouth as needed.      Marland Kitchen KLOR-CON M20 20 MEQ tablet 3 tablets Daily.      Marland Kitchen lamoTRIgine (LAMICTAL) 200 MG tablet Twice daily.      . montelukast (SINGULAIR) 10 MG tablet Daily.      Marland Kitchen NEXIUM 40 MG capsule Once daily as needed.      . Omega-3 Fatty Acids (FISH OIL) 1200 MG CAPS Take by mouth daily.      Marland Kitchen SAPHRIS 10 MG SUBL At bedtime.      . vitamin C (ASCORBIC ACID) 500 MG tablet Take 500 mg by mouth 2 (two) times daily.      Marland Kitchen VITAMIN D, ERGOCALCIFEROL, PO Take 1,200 mg by mouth daily.       No current  facility-administered medications for this visit.    Allergies:   No Known Allergies  Social History:  The patient  reports that she quit smoking about 20 years ago. She does not have any smokeless tobacco history on file. She reports that she drinks about 12.6 ounces of alcohol per week. She reports that she does not use illicit drugs.   ROS:  Please see the history of present illness.   No bleeding, syncope, orthopnea,PND  PHYSICAL EXAM: VS:  BP 126/76  Pulse 89  Ht 5\' 3"  (1.6 m) Well nourished, well developed, in no acute distress HEENT: normal Neck: no JVD Cardiac:  normal S1, S2; RRR; no murmur Lungs:  clear to auscultation bilaterally, no wheezing, rhonchi or rales Abd: soft, nontender, no hepatomegaly Ext: no edema Skin: warm and dry Neuro: no focal abnormalities noted  EKG:  Normal rhythm, radiating 9, short PR interval, no evidence of delta wave.      ASSESSMENT AND PLAN:  1. Family history of coronary artery disease-previous treadmill test reassuring, low risk in 2012. Continue with primary prevention. 2. Hypertension-currently well controlled, no changes  made 3. Hyperlipidemia - currently diet only. LDL was 173 with triglycerides greater than 200. Encouraged statin use. Perhaps atorvastatin 20 mg once a day may be a good place to start. She wanted me to recommend this to Dr. Foy Guadalajara.  4. Continue to encourage exercise, she is continuing to work through her grief from loss of husband.  Signed, Donato Schultz, MD [facility]  09/27/2013 1:54 PM

## 2013-10-30 ENCOUNTER — Other Ambulatory Visit: Payer: Self-pay | Admitting: Dermatology

## 2013-12-19 ENCOUNTER — Ambulatory Visit: Payer: Medicare Other | Admitting: Cardiovascular Disease

## 2013-12-21 ENCOUNTER — Encounter: Payer: Self-pay | Admitting: Cardiovascular Disease

## 2013-12-21 ENCOUNTER — Ambulatory Visit (INDEPENDENT_AMBULATORY_CARE_PROVIDER_SITE_OTHER): Payer: Medicare Other | Admitting: Cardiovascular Disease

## 2013-12-21 VITALS — BP 118/72 | HR 96 | Ht 63.5 in | Wt 151.0 lb

## 2013-12-21 DIAGNOSIS — T148XXA Other injury of unspecified body region, initial encounter: Secondary | ICD-10-CM

## 2013-12-21 DIAGNOSIS — L97519 Non-pressure chronic ulcer of other part of right foot with unspecified severity: Secondary | ICD-10-CM | POA: Insufficient documentation

## 2013-12-21 DIAGNOSIS — L97509 Non-pressure chronic ulcer of other part of unspecified foot with unspecified severity: Secondary | ICD-10-CM

## 2013-12-21 NOTE — Assessment & Plan Note (Signed)
The patient was sent over by Dr. Harlow Mares for  a slowly healing right second toe ulcer.there is no history of claudication. The ulcer  appears to be related related to trauma. She has 2+ pedal pulses bilaterally. A tourniquet lower extremity arterial Doppler studies which I suspect within normal see her back when necessary.

## 2013-12-21 NOTE — Patient Instructions (Signed)
  We will see you back in follow up only if needed based on the doppler results.  Dr Gwenlyn Found has ordered lower extremity arterial dopplers.

## 2013-12-21 NOTE — Progress Notes (Signed)
12/21/2013 Elizabeth Stone   05-06-1946  [id number]  Primary Physician FRIED, Jaymes Graff, MD Primary Cardiologist: Lorretta Harp MD Renae Gloss   HPI:  Ms. Sedor is a very delightful 68 year old widowed Caucasian female patient of Dr. Candee Furbish and Dr. Georga Bora who is referred by Dr. Harlow Mares at [facility] for evaluation of a slowly healing right second toe ulcer. He also has impressive for several months. The patient specifically denies claudication. Her only cardiovascular risk factor is mixed hyperlipidemia.   Current Outpatient Prescriptions  Medication Sig Dispense Refill  . aspirin 81 MG tablet Take 81 mg by mouth daily.      Marland Kitchen atorvastatin (LIPITOR) 20 MG tablet Take 20 mg by mouth daily.      Marland Kitchen buPROPion (WELLBUTRIN XL) 300 MG 24 hr tablet Daily.      Marland Kitchen CALCIUM PO Take 1 tablet by mouth 2 (two) times daily.      . CELEBREX 200 MG capsule Daily.      . chlorthalidone (HYGROTON) 50 MG tablet Daily.      . clonazePAM (KLONOPIN) 1 MG tablet 0.25mg  2 tablets at bedtime      . CYMBALTA 60 MG capsule Daily.      . hydrochlorothiazide (HYDRODIURIL) 25 MG tablet Daily.      . Hydrocodone-Acetaminophen (VICODIN PO) Take 1 tablet by mouth 4 (four) times daily as needed.      . Hydrocodone-Homatropine (HYDROMET PO) Take 5 mLs by mouth as needed.      Marland Kitchen KLOR-CON M20 20 MEQ tablet 3 tablets Daily.      Marland Kitchen lamoTRIgine (LAMICTAL) 200 MG tablet Twice daily.      . montelukast (SINGULAIR) 10 MG tablet Take 10 mg by mouth daily as needed.       Marland Kitchen NEXIUM 40 MG capsule Once daily as needed.      . Omega-3 Fatty Acids (FISH OIL) 1200 MG CAPS Take by mouth daily.      Marland Kitchen SAPHRIS 10 MG SUBL At bedtime.      . vitamin C (ASCORBIC ACID) 500 MG tablet Take 500 mg by mouth 2 (two) times daily.      Marland Kitchen VITAMIN D, ERGOCALCIFEROL, PO Take 1,200 mg by mouth daily.       No current facility-administered medications for this visit.    No Known Allergies  History   Social  History  . Marital Status: Widowed    Spouse Name: N/A    Number of Children: N/A  . Years of Education: N/A   Occupational History  . Not on file.   Social History Main Topics  . Smoking status: Former Smoker    Quit date: 08/25/1993  . Smokeless tobacco: Not on file  . Alcohol Use: 12.6 oz/week    21 Glasses of wine per week  . Drug Use: No  . Sexual Activity:    Other Topics Concern  . Not on file   Social History Narrative  . No narrative on file     Review of Systems: General: negative for chills, fever, night sweats or weight changes.  Cardiovascular: negative for chest pain, dyspnea on exertion, edema, orthopnea, palpitations, paroxysmal nocturnal dyspnea or shortness of breath Dermatological: negative for rash Respiratory: negative for cough or wheezing Urologic: negative for hematuria Abdominal: negative for nausea, vomiting, diarrhea, bright red blood per rectum, melena, or hematemesis Neurologic: negative for visual changes, syncope, or dizziness All other systems reviewed and are otherwise negative except  as noted above.    Blood pressure 118/72, pulse 96, height 5' 3.5" (1.613 m), weight 68.493 kg (151 lb).  General appearance: alert and no distress Neck: no adenopathy, no carotid bruit, no JVD, supple, symmetrical, trachea midline and thyroid not enlarged, symmetric, no tenderness/mass/nodules Lungs: clear to auscultation bilaterally Heart: regular rate and rhythm, S1, S2 normal, no murmur, click, rub or gallop Extremities: extremities normal, atraumatic, no cyanosis or edema and 2+ pedal pulses bilaterally  EKG not performed today  ASSESSMENT AND PLAN:   Ulcer of right second toe The patient was sent over by Dr. Harlow Mares for  a slowly healing right second toe ulcer.there is no history of claudication. The ulcer  appears to be related related to trauma. She has 2+ pedal pulses bilaterally. A tourniquet lower extremity arterial Doppler studies which  I suspect within normal see her back when necessary.      Lorretta Harp MD FACP,FACC,FAHA, [facility] 12/21/2013 3:39 PM

## 2013-12-25 ENCOUNTER — Telehealth (HOSPITAL_COMMUNITY): Payer: Self-pay | Admitting: *Deleted

## 2014-01-11 ENCOUNTER — Telehealth (HOSPITAL_COMMUNITY): Payer: Self-pay | Admitting: *Deleted

## 2014-01-30 ENCOUNTER — Encounter (HOSPITAL_COMMUNITY): Payer: Medicare Other

## 2014-07-02 ENCOUNTER — Other Ambulatory Visit: Payer: Self-pay | Admitting: Orthopedic Surgery

## 2014-07-02 DIAGNOSIS — M25612 Stiffness of left shoulder, not elsewhere classified: Secondary | ICD-10-CM

## 2014-07-02 DIAGNOSIS — R52 Pain, unspecified: Secondary | ICD-10-CM

## 2014-07-19 ENCOUNTER — Ambulatory Visit: Payer: Self-pay | Admitting: Podiatry

## 2014-07-23 ENCOUNTER — Ambulatory Visit (INDEPENDENT_AMBULATORY_CARE_PROVIDER_SITE_OTHER): Payer: Medicare Other | Admitting: Podiatry

## 2014-07-23 ENCOUNTER — Ambulatory Visit (INDEPENDENT_AMBULATORY_CARE_PROVIDER_SITE_OTHER): Payer: Medicare Other

## 2014-07-23 ENCOUNTER — Encounter: Payer: Self-pay | Admitting: Podiatry

## 2014-07-23 VITALS — BP 166/78 | HR 99 | Resp 16

## 2014-07-23 DIAGNOSIS — M2042 Other hammer toe(s) (acquired), left foot: Secondary | ICD-10-CM

## 2014-07-23 DIAGNOSIS — M216X9 Other acquired deformities of unspecified foot: Secondary | ICD-10-CM

## 2014-07-23 DIAGNOSIS — Q667 Congenital pes cavus: Secondary | ICD-10-CM

## 2014-07-23 NOTE — Progress Notes (Signed)
   Subjective:    Patient ID: Elizabeth Stone, female    DOB: September 29, 1946, 68 y.o.   MRN: [id number]  HPI Comments: "I have a hammer toe"  Patient C/O aching 2nd toe left for about 3 years. The toe is overlapping 1st toe. The toe gets red at the knuckle. Trouble with shoes. Tried just to accomodate with wider shoes.  Toe Pain       Review of Systems  All other systems reviewed and are negative.      Objective:   Physical Exam        Assessment & Plan:

## 2014-07-24 NOTE — Progress Notes (Signed)
Subjective:     Patient ID: Elizabeth Stone, female   DOB: 10/14/1946, 68 y.o.   MRN: [id number]  Toe Pain    patient presents stating I have a very rigid second toe on my left foot that's becoming hard for me to wear shoe gear with and my third toe also gives me trouble and it seems like it's really gotten worse the last couple years   Review of Systems  All other systems reviewed and are negative.      Objective:   Physical Exam  Nursing note and vitals reviewed. Constitutional: She is oriented to person, place, and time.  Cardiovascular: Intact distal pulses.   Musculoskeletal: Normal range of motion.  Neurological: She is oriented to person, place, and time.  Skin: Skin is warm.   neurovascular status intact with muscle strength adequate and range of motion of the subtalar midtarsal joint within normal limits. Patient has good digital perfusion here growth and is noted to have moderate depression of the arch upon weightbearing. Her second toe is severely contracted and is lifted and medially deviated with redness on top of the proximal phalanx and the third toe is also medial deviated with inflammation around the second metatarsophalangeal joint     Assessment:     Probable flexor plate rupture leading to complete dislocation of the second metatarsophalangeal joint and toe and third toe deviation and problems    Plan:     Patient would like to have this fixed and needs to get it done in November when she returns from her vacation. 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 I did explain to her all complications as listed on the consent form and the fact there is no guarantee we'll be able to put the second toe in normal alignment and it may lift and also the third toe may lift. Patient wants surgery and  understands recovery can be 6 months to one year for these types of procedures

## 2014-08-28 ENCOUNTER — Encounter: Payer: Self-pay | Admitting: Podiatry

## 2014-08-28 DIAGNOSIS — M2022 Hallux rigidus, left foot: Secondary | ICD-10-CM

## 2014-08-28 DIAGNOSIS — M21542 Acquired clubfoot, left foot: Secondary | ICD-10-CM

## 2014-08-29 ENCOUNTER — Telehealth: Payer: Self-pay

## 2014-08-29 NOTE — Telephone Encounter (Signed)
Left message for patient to call with questions or concerns, also returning phone call regarding questions about sleeping in her boot

## 2014-09-02 NOTE — Progress Notes (Signed)
Dr Paulla Dolly performed a metatarsal osteotomy of left 2nd met with hammertoe repair of 2, 3 left met on 08/28/14

## 2014-09-03 ENCOUNTER — Encounter: Payer: Self-pay | Admitting: Podiatry

## 2014-09-03 ENCOUNTER — Ambulatory Visit (INDEPENDENT_AMBULATORY_CARE_PROVIDER_SITE_OTHER): Payer: Medicare Other | Admitting: Podiatry

## 2014-09-03 ENCOUNTER — Ambulatory Visit (INDEPENDENT_AMBULATORY_CARE_PROVIDER_SITE_OTHER): Payer: Medicare Other

## 2014-09-03 VITALS — BP 121/68 | HR 79 | Resp 16

## 2014-09-03 DIAGNOSIS — M2042 Other hammer toe(s) (acquired), left foot: Secondary | ICD-10-CM

## 2014-09-04 NOTE — Progress Notes (Signed)
Subjective:     Patient ID: Elizabeth Stone, female   DOB: Jul 24, 1946, 68 y.o.   MRN: [id number]  HPIpatient states I'm doing very well with my left foot but I do know that the toe is not in perfect position and I do know that it was in severe malalignment prior to surgery   Review of Systems     Objective:   Physical Exam Neurovascular status intact with well-healing surgical sites of the second third and fourth digit and the second metatarsal. Patient does have elevation of the second toe in a dorsal medial direction secondary to the significant trauma that had been present prior to the procedure but it is straight now versus having a significant hammertoe like deformity    Assessment:     Healing well from surgery with malalignment noted of the second digit    Plan:     I did reduce the digit and was able to dress it in a way to lower it and plantar flex it. I did explain to patient that it's possible it's can continue to lift but we will continue to try to stretch the tissue as the swelling reduces and hopefully the toe will gradually come down. This is a very difficult problem and may not give [place] the best cosmetic result but I do think it'll be much better for her from a pain perspective reviewed x-rays

## 2014-09-10 ENCOUNTER — Encounter: Payer: Self-pay | Admitting: Podiatry

## 2014-09-10 ENCOUNTER — Ambulatory Visit (INDEPENDENT_AMBULATORY_CARE_PROVIDER_SITE_OTHER): Payer: Medicare Other | Admitting: Podiatry

## 2014-09-10 VITALS — BP 133/69 | HR 92 | Resp 16

## 2014-09-10 DIAGNOSIS — M2042 Other hammer toe(s) (acquired), left foot: Secondary | ICD-10-CM

## 2014-09-12 NOTE — Progress Notes (Signed)
Subjective:     Patient ID: Elizabeth Stone, female   DOB: 09/30/1946, 68 y.o.   MRN: [id number]  HPI patient states doing good but my toe is still up in the air.   Review of Systems     Objective:   Physical Exam Neurovascular status intact with stitches in place wound edges well coapted and continued elevation of the second toe from what was a severe preoperative deformity    Assessment:     Hammertoe deformity second and third left with significant displacement of the second toe    Plan:     Remove stitches and reviewed with patient the difficulty of her condition secondary to the deformity. Dispensed a digital splint to keep the toe plantar flexed and showed her how to use this properly

## 2014-09-18 ENCOUNTER — Other Ambulatory Visit: Payer: Medicare Other

## 2014-09-25 ENCOUNTER — Encounter: Payer: Self-pay | Admitting: Podiatry

## 2014-09-25 ENCOUNTER — Ambulatory Visit (INDEPENDENT_AMBULATORY_CARE_PROVIDER_SITE_OTHER): Payer: Medicare Other

## 2014-09-25 ENCOUNTER — Ambulatory Visit (INDEPENDENT_AMBULATORY_CARE_PROVIDER_SITE_OTHER): Payer: Medicare Other | Admitting: Podiatry

## 2014-09-25 VITALS — BP 122/78 | HR 74 | Resp 16

## 2014-09-25 DIAGNOSIS — M2042 Other hammer toe(s) (acquired), left foot: Secondary | ICD-10-CM

## 2014-09-25 DIAGNOSIS — Z9889 Other specified postprocedural states: Secondary | ICD-10-CM

## 2014-09-25 DIAGNOSIS — M216X9 Other acquired deformities of unspecified foot: Secondary | ICD-10-CM

## 2014-09-25 DIAGNOSIS — Q667 Congenital pes cavus: Secondary | ICD-10-CM

## 2014-09-25 NOTE — Progress Notes (Signed)
Subjective:     Patient ID: Elizabeth Stone, female   DOB: 04-26-1946, 68 y.o.   MRN: [id number]  HPI patient states that I'm still getting some swelling in my foot and I wanted to make sure that was normal but overall everything is doing well   Review of Systems     Objective:   Physical Exam Neurovascular status intact with continued edema in the left forefoot which is normal for this. Postop with digit in good alignment and no drainage or indications of other issues. Wound edges are well coapted and the second toe is still mildly lifted in comparison to adjacent digits    Assessment:     Healing well with mild to moderate elevation of the second toe which hopefully will come down as swelling reduce    Plan:     Reviewed x-rays and explained importance of splint in order to lower the second toe and continue with elevation and compression. Reappoint to recheck again in 4 weeks earlier if necessary

## 2014-10-02 ENCOUNTER — Ambulatory Visit
Admission: RE | Admit: 2014-10-02 | Discharge: 2014-10-02 | Disposition: A | Discharge status: Home / Self Care | Payer: Medicare Other | Source: Ambulatory Visit | Attending: Orthopedic Surgery | Admitting: Orthopedic Surgery

## 2014-10-02 DIAGNOSIS — M25612 Stiffness of left shoulder, not elsewhere classified: Secondary | ICD-10-CM

## 2014-10-02 DIAGNOSIS — R52 Pain, unspecified: Secondary | ICD-10-CM

## 2014-10-04 ENCOUNTER — Ambulatory Visit: Payer: Medicare Other | Admitting: Cardiology

## 2014-10-23 ENCOUNTER — Ambulatory Visit (INDEPENDENT_AMBULATORY_CARE_PROVIDER_SITE_OTHER): Payer: Medicare Other

## 2014-10-23 ENCOUNTER — Encounter: Payer: Self-pay | Admitting: Podiatry

## 2014-10-23 ENCOUNTER — Ambulatory Visit (INDEPENDENT_AMBULATORY_CARE_PROVIDER_SITE_OTHER): Payer: Medicare Other | Admitting: Podiatry

## 2014-10-23 VITALS — BP 121/68 | HR 79 | Resp 16

## 2014-10-23 DIAGNOSIS — M2042 Other hammer toe(s) (acquired), left foot: Secondary | ICD-10-CM

## 2014-10-23 NOTE — Progress Notes (Signed)
Subjective:     Patient ID: Elizabeth Stone, female   DOB: [birth year], 69 y.o.   MRN: [id number]  HPI patient states I'm doing well with my left foot with mild swelling still but besides that I'm walking well   Review of Systems     Objective:   Physical Exam Neurovascular status intact with negative Homans sign noted and patient noted to have excellent healing of the incision sites left second and third metatarsal with moderate forefoot edema still noted    Assessment:     Doing well with positioning with moderate edema still present    Plan:     X-rays reviewed and discussed there is some secondary bone healing but this should heal uneventfully over time and that she needs to continue to reduce her activity and protect her foot but that she can walk on it and that elevation and compression is still necessary. Reappoint her recheck in 6 weeks

## 2014-11-14 ENCOUNTER — Other Ambulatory Visit: Payer: Self-pay | Admitting: Dermatology

## 2014-11-15 ENCOUNTER — Encounter: Payer: Self-pay | Admitting: Cardiology

## 2014-11-15 ENCOUNTER — Ambulatory Visit (INDEPENDENT_AMBULATORY_CARE_PROVIDER_SITE_OTHER): Payer: Medicare Other | Admitting: Cardiology

## 2014-11-15 VITALS — BP 124/68 | HR 81 | Ht 63.5 in | Wt 154.0 lb

## 2014-11-15 DIAGNOSIS — Z8249 Family history of ischemic heart disease and other diseases of the circulatory system: Secondary | ICD-10-CM

## 2014-11-15 DIAGNOSIS — I10 Essential (primary) hypertension: Secondary | ICD-10-CM

## 2014-11-15 DIAGNOSIS — E782 Mixed hyperlipidemia: Secondary | ICD-10-CM

## 2014-11-15 NOTE — Progress Notes (Signed)
[place] [place], [place]  [address] Phone: [phone] Fax:  [phone]  Date:  11/15/2014   ID:  Elizabeth Stone, Elizabeth Stone [birth year], MRN [id number]  PCP:  Abigail Miyamoto, MD   History of Present Illness: Elizabeth Stone is a 69 y.o. female with hypertension, hyperlipidemia here for follow up with family history of heart disease. She had stress test, ETT in 12/12,  which was low risk, 32min, no ECG changes. Mother had a few MI's had pacer. Father had MI 77's. Pacer as well.   Able to work on treadmill, bike, step class. With trainer. One prior stress test there was a "problem" years ago. Sounds like she may have had an SVT that autoconverted. Dr. Jaci Standard has seen her in the past and stated that she did not have mitral valve prolapse.  Unfortunately, lost her husband in August of 2014 due to a massive stroke. The night before, she states that they had dinner out [place]. They have not done that for 10 years. They have a wonderful time.  Hyperlipidemia-I have reviewed her lab work. See below.  She had a slowly healing right second toe wound. She saw Dr. Gwenlyn Found, no further workup necessary. Good overall pulses.  11/15/14- Over the last 8 months she has noted increased diaphoresis with simple exertion. She is also noted some mild increase dyspnea on exertion. This is not significant dyspnea. She does not have any chest pain.   Wt Readings from Last 3 Encounters:  11/15/14 154 lb (69.854 kg)  12/21/13 151 lb (68.493 kg)  09/08/12 152 lb (68.947 kg)     Past Medical History  Diagnosis Date  . Arthritis   . Anxiety   . Depression   . GERD (gastroesophageal reflux disease)   . Meniere disease     takes HCTZ for her inner ear problem  . Osteoporosis   . Hearing impaired     has cochlear implant  . Mixed hyperlipidemia 09/27/2013    Past Surgical History  Procedure Laterality Date  . Abdominal hysterectomy    . Bladder tack    .  Cochlear implant      2007    Current Outpatient Prescriptions  Medication Sig Dispense Refill  . aspirin 81 MG tablet Take 81 mg by mouth daily.    Marland Kitchen atorvastatin (LIPITOR) 10 MG tablet Take 10 mg by mouth daily.    Marland Kitchen buPROPion (WELLBUTRIN XL) 300 MG 24 hr tablet Daily.    Marland Kitchen CALCIUM PO Take 1 tablet by mouth 2 (two) times daily.    . CELEBREX 200 MG capsule Daily.    . chlorthalidone (HYGROTON) 50 MG tablet Daily.    . clonazePAM (KLONOPIN) 1 MG tablet 0.25mg  2 tablets at bedtime    . CYMBALTA 60 MG capsule Daily.    . hydrochlorothiazide (HYDRODIURIL) 25 MG tablet Daily.    . Hydrocodone-Homatropine (HYDROMET PO) Take 5 mLs by mouth as needed.    Marland Kitchen KLOR-CON M20 20 MEQ tablet 3 tablets Daily.    Marland Kitchen lamoTRIgine (LAMICTAL) 200 MG tablet Twice daily.    . montelukast (SINGULAIR) 10 MG tablet Take 10 mg by mouth daily as needed.     Marland Kitchen NEXIUM 40 MG capsule Once daily as needed.    . Omega-3 Fatty Acids (FISH OIL) 1200 MG CAPS Take by mouth daily.    . promethazine (PHENERGAN) 25 MG tablet Take 25 mg by mouth  every 6 (six) hours as needed for nausea or vomiting.    . Promethazine HCl (PHENERGAN PO) Take by mouth.    Marland Kitchen SAPHRIS 10 MG SUBL At bedtime.    . vitamin C (ASCORBIC ACID) 500 MG tablet Take 500 mg by mouth 2 (two) times daily.    Marland Kitchen VITAMIN D, ERGOCALCIFEROL, PO Take 1,200 mg by mouth daily.     No current facility-administered medications for this visit.    Allergies:   No Known Allergies  Social History:  The patient  reports that she quit smoking about 21 years ago. She does not have any smokeless tobacco history on file. She reports that she drinks about 12.6 oz of alcohol per week. She reports that she does not use illicit drugs.   ROS:  Please see the history of present illness.  Excessive sweating. No bleeding, syncope, orthopnea,PND  PHYSICAL EXAM: VS:  BP 124/68 mmHg  Pulse 81  Ht 5' 3.5" (1.613 m)  Wt 154 lb (69.854 kg)  BMI 26.85 kg/m2 Well nourished, well  developed, in no acute distress HEENT: normal Neck: no JVD Cardiac:  normal S1, S2; RRR; no murmur Lungs:  clear to auscultation bilaterally, no wheezing, rhonchi or rales Abd: soft, nontender, no hepatomegaly Ext: no edema Skin: warm and dry Neuro: no focal abnormalities noted  EKG: 11/15/14- Normal rhythm, 81, short PR interval, no evidence of delta wave. Nonspecific ST changes.      ASSESSMENT AND PLAN:  1. Dyspnea-diaphoresis-we will go ahead and check an exercise treadmill test to ensure that this is not a marker of ischemia. It is been 4 years since her last test 2. Family history of coronary artery disease-previous treadmill test reassuring, low risk in 2012. Continue with primary prevention. 3. Hypertension-currently well controlled, no changes made 4. Hyperlipidemia - currently diet only. LDL was 173 with triglycerides greater than 200 without education. Encouraged statin use. Taking atorvastatin 10 mg once a day may be a good place to start.  Continue to encourage exercise We will see back in one year follow-up. Signed, Candee Furbish, MD [facility]  11/15/2014 3:51 PM

## 2014-11-15 NOTE — Patient Instructions (Signed)
The current medical regimen is effective;  continue present plan and medications.  Your physician has requested that you have an exercise tolerance test. For further information please visit [email or web address]. Please also follow instruction sheet, as given.  Follow up in 1 year with Dr. Marlou Porch.  You will receive a letter in the mail 2 months before you are due.  Please call us when you receive this letter to schedule your follow up appointment.

## 2014-11-21 ENCOUNTER — Other Ambulatory Visit: Payer: Self-pay | Admitting: Orthopedic Surgery

## 2014-11-21 DIAGNOSIS — M25511 Pain in right shoulder: Secondary | ICD-10-CM

## 2014-11-26 ENCOUNTER — Ambulatory Visit
Admission: RE | Admit: 2014-11-26 | Discharge: 2014-11-26 | Disposition: A | Discharge status: Home / Self Care | Payer: Medicare Other | Source: Ambulatory Visit | Attending: Orthopedic Surgery | Admitting: Orthopedic Surgery

## 2014-11-26 ENCOUNTER — Other Ambulatory Visit: Payer: Medicare Other

## 2014-11-26 DIAGNOSIS — M25511 Pain in right shoulder: Secondary | ICD-10-CM

## 2014-11-29 ENCOUNTER — Inpatient Hospital Stay (HOSPITAL_COMMUNITY): Admission: RE | Admit: 2014-11-29 | Payer: Medicare Other | Source: Ambulatory Visit

## 2014-11-30 ENCOUNTER — Telehealth (HOSPITAL_COMMUNITY): Payer: Self-pay

## 2014-11-30 NOTE — Telephone Encounter (Signed)
Encounter complete. 

## 2014-12-03 NOTE — Pre-Procedure Instructions (Signed)
KAHLEA COBERT  12/03/2014   Your procedure is scheduled on:  Thursday, Feb. 25th   Report to [facility] Admitting at  5:30  AM.   Call this number if you have problems the morning of surgery: [phone]   Remember:   Do not eat food or drink liquids after midnight Wednesday.   Take these medicines the morning of surgery with A SIP OF WATER: Wellbutrin,Cymbalta, Hydrocodone, Lamictal, Nexium.                         Do NOT take any aspirin, anti-inflammatories, herbal medications 4-5 days prior to surgery.   Do not wear jewelry, make-up or nail polish.  Do not wear lotions, powders, or perfumes. You may NOT wear deodorant the day of surgery.  Do not shave underarms & legs 48 hours prior to surgery.    Do not bring valuables to the hospital.  [facility] is not responsible for any belongings or valuables.               Contacts, dentures or bridgework may not be worn into surgery.  Leave suitcase in the car. After surgery it may be brought to your room.  For patients admitted to the hospital, discharge time is determined by your treatment team.    Name and phone number of your driver:    Special Instructions: "Preparing for Surgery" instruction sheet.   Please read over the following fact sheets that you were given: Pain Booklet, Coughing and Deep Breathing, Blood Transfusion Information, MRSA Information and Surgical Site Infection Prevention

## 2014-12-04 ENCOUNTER — Encounter (HOSPITAL_COMMUNITY): Payer: Self-pay

## 2014-12-04 ENCOUNTER — Encounter (HOSPITAL_COMMUNITY)
Admission: RE | Admit: 2014-12-04 | Discharge: 2014-12-04 | Disposition: A | Discharge status: Home / Self Care | Payer: Medicare Other | Source: Ambulatory Visit | Attending: Orthopedic Surgery | Admitting: Orthopedic Surgery

## 2014-12-04 ENCOUNTER — Ambulatory Visit: Payer: Medicare Other | Admitting: Podiatry

## 2014-12-04 HISTORY — DX: Fibromyalgia: M79.7

## 2014-12-04 LAB — CBC WITH DIFFERENTIAL/PLATELET
Basophils Absolute: 0 10*3/uL (ref 0.0–0.1)
Basophils Relative: 0 % (ref 0–1)
EOS ABS: 0.1 10*3/uL (ref 0.0–0.7)
EOS PCT: 1 % (ref 0–5)
HCT: 45.8 % (ref 36.0–46.0)
HEMOGLOBIN: 16.1 g/dL — AB (ref 12.0–15.0)
Lymphocytes Relative: 31 % (ref 12–46)
Lymphs Abs: 1.8 10*3/uL (ref 0.7–4.0)
MCH: 33.5 pg (ref 26.0–34.0)
MCHC: 35.2 g/dL (ref 30.0–36.0)
MCV: 95.2 fL (ref 78.0–100.0)
MONOS PCT: 10 % (ref 3–12)
Monocytes Absolute: 0.6 10*3/uL (ref 0.1–1.0)
Neutro Abs: 3.2 10*3/uL (ref 1.7–7.7)
Neutrophils Relative %: 58 % (ref 43–77)
Platelets: 195 10*3/uL (ref 150–400)
RBC: 4.81 MIL/uL (ref 3.87–5.11)
RDW: 12 % (ref 11.5–15.5)
WBC: 5.6 10*3/uL (ref 4.0–10.5)

## 2014-12-04 LAB — PROTIME-INR
INR: 1.1 (ref 0.00–1.49)
Prothrombin Time: 14.3 seconds (ref 11.6–15.2)

## 2014-12-04 LAB — COMPREHENSIVE METABOLIC PANEL
ALT: 93 U/L — ABNORMAL HIGH (ref 0–35)
AST: 79 U/L — AB (ref 0–37)
Albumin: 4.3 g/dL (ref 3.5–5.2)
Alkaline Phosphatase: 136 U/L — ABNORMAL HIGH (ref 39–117)
Anion gap: 12 (ref 5–15)
BILIRUBIN TOTAL: 0.8 mg/dL (ref 0.3–1.2)
BUN: 11 mg/dL (ref 6–23)
CHLORIDE: 96 mmol/L (ref 96–112)
CO2: 27 mmol/L (ref 19–32)
Calcium: 10.6 mg/dL — ABNORMAL HIGH (ref 8.4–10.5)
Creatinine, Ser: 0.66 mg/dL (ref 0.50–1.10)
GFR calc Af Amer: >90 mL/min (ref >90)
GFR calc non Af Amer: >90 mL/min (ref >90)
Glucose, Bld: 98 mg/dL (ref 70–99)
Potassium: 3 mmol/L — ABNORMAL LOW (ref 3.5–5.1)
Sodium: 135 mmol/L (ref 135–145)
Total Protein: 7.3 g/dL (ref 6.0–8.3)

## 2014-12-04 LAB — URINALYSIS, ROUTINE W REFLEX MICROSCOPIC
Bilirubin Urine: NEGATIVE
Glucose, UA: NEGATIVE mg/dL
HGB URINE DIPSTICK: NEGATIVE
Ketones, ur: NEGATIVE mg/dL
Leukocytes, UA: NEGATIVE
NITRITE: NEGATIVE
PROTEIN: NEGATIVE mg/dL
SPECIFIC GRAVITY, URINE: 1.013 (ref 1.005–1.030)
UROBILINOGEN UA: 1 mg/dL (ref 0.0–1.0)
pH: 8 (ref 5.0–8.0)

## 2014-12-04 LAB — APTT: aPTT: 34 seconds (ref 24–37)

## 2014-12-04 LAB — TYPE AND SCREEN
ABO/RH(D): B POS
Antibody Screen: NEGATIVE

## 2014-12-04 LAB — ABO/RH: ABO/RH(D): B POS

## 2014-12-04 LAB — SURGICAL PCR SCREEN
MRSA, PCR: NEGATIVE
Staphylococcus aureus: NEGATIVE

## 2014-12-04 NOTE — Progress Notes (Addendum)
Because of family hx of heart problems, patient was proactive getting into see cardio, Elizabeth Furbish, Elizabeth Stone.  They did do stress test in 09/2011.   Currently doing well.  (She has follow up appt tomorrorw with Dr. Marlou Stone) Sleep study was done, "somewhere off of [facility]"...trying to locate that facility, but have contacted Kim @ medical records with [facility] to help.  DA

## 2014-12-05 ENCOUNTER — Ambulatory Visit (HOSPITAL_COMMUNITY)
Admission: RE | Admit: 2014-12-05 | Discharge: 2014-12-05 | Disposition: A | Discharge status: Home / Self Care | Payer: Medicare Other | Source: Ambulatory Visit | Attending: Cardiology | Admitting: Cardiology

## 2014-12-05 DIAGNOSIS — I10 Essential (primary) hypertension: Secondary | ICD-10-CM | POA: Insufficient documentation

## 2014-12-05 DIAGNOSIS — Z8249 Family history of ischemic heart disease and other diseases of the circulatory system: Secondary | ICD-10-CM | POA: Insufficient documentation

## 2014-12-05 NOTE — Procedures (Signed)
Exercise Treadmill Test  Pre-Exercise Testing Evaluation  NSR, normal tracing  Test  Exercise Tolerance Test Ordering MD: Candee Furbish, MD    Unique Test No: 1  Treadmill:  1  Indication for ETT: exertional dyspnea  Contraindication to ETT: No   Stress Modality: exercise - treadmill  Cardiac Imaging Performed: non   Protocol: standard Bruce - maximal  Max BP:  204/100  Max MPHR (bpm):  154 85% MPR (bpm):  130  MPHR obtained (bpm):  139 % MPHR obtained:  90  Reached 85% MPHR (min:sec):  5:30 Total Exercise Time (min-sec):  6:35  Workload in METS:  7.9 Borg Scale: 15  Reason ETT Terminated:  SOB and Leg Discomfort    ST Segment Analysis At Rest: normal ST segments - no evidence of significant ST depression With Exercise: no evidence of significant ST depression  Other Information Arrhythmia:  No Angina during ETT:  absent (0) Quality of ETT:  diagnostic  ETT Interpretation:  normal - no evidence of ischemia by ST analysis  Comments: Hypertensive response to exercise. Fair exercise tolerance  Sanda Klein, MD, [facility] [phone] office [phone] pager

## 2014-12-06 NOTE — Progress Notes (Addendum)
Anesthesia Chart Review:  Patient is a 69 year old female scheduled for right reverse total shoulder replacement on 12/13/14 by Dr. Tamera Punt.  History includes former smoker, HLD, fibromyalgia, GERD, anxiety, depression. Arthritis, hearing impaired with history of Cochlear implant, Meniere disease, post-operative N/V. PCP is listed as Dr. Briscoe Deutscher.   Meds include ASA 62m, Lipitor, Wellbutrin XL, Celebrex, chlorthalidone, Klonopin, Cymbalta, HCTZ, KCL, Norco, Lamictal, Singulair, Nexium, fish oil, Saphris.  Cardiologist is Dr. MCandee Furbish last seen on 11/15/14.  Patient has a family history of CAD. An exercise stress test was ordered and done on 12/05/14 and showed: ETT Interpretation: normal - no evidence of ischemia by ST analysis. Comments: Hypertensive response to exercise. Fair exercise tolerance  11/15/14 EKG: NSR.  Preoperative labs noted.  K 3.0, Alk Phos 136, AST/ALT 79/93, previously 62/76 on 06/14/14 at Dr. FDelman Kittenoffice--his notations indicates, "Two liver tests are slightly elevated but stable. Stay on same meds."  She is on several medications that can effect LFTs.  H/H 16.1/45.8. PLT count, PT/PTT WNL.   She had recent normal ETT.  She has known elevated LFTs, slightly higher then 05/2014 but with normal PLT and PT/PTT.  Will not plan to repeat labs preoperatively.  Anesthesiologist Dr. JLinna Capriceagrees with this plan.  Of note, Dr. FDelman Kittenoffice has patient with her name, same address, phone number, date of birth, etc with the DOB of 102/28/1947 however, [place] has her DOB listed as [id number] I called and spoke with patient, and she confirmed her DOB was really 1May 10, 1947  I did tell her that Dr. FDelman Kittenoffice has her DOB as 112/21/1947 so she could clarify with them as well.    AGeorge HughMMedstar [facility]oniumShort Stay Center/Anesthesiology Phone ([phone]/18/2016 12:02 PM

## 2014-12-10 NOTE — Progress Notes (Signed)
Kim from [facility] stated she will fax over sleep study.

## 2014-12-12 MED ORDER — POVIDONE-IODINE 7.5 % EX SOLN
Freq: Once | CUTANEOUS | Status: DC
  Filled 2014-12-12: qty 118

## 2014-12-12 MED ORDER — CEFAZOLIN SODIUM-DEXTROSE 2-3 GM-% IV SOLR
2.0000 g | INTRAVENOUS | Status: AC
  Administered 2014-12-13: 2 g via INTRAVENOUS

## 2014-12-13 ENCOUNTER — Encounter (HOSPITAL_COMMUNITY): Payer: Self-pay | Admitting: *Deleted

## 2014-12-13 ENCOUNTER — Inpatient Hospital Stay (HOSPITAL_COMMUNITY): Payer: Medicare Other | Admitting: Anesthesiology

## 2014-12-13 ENCOUNTER — Inpatient Hospital Stay (HOSPITAL_COMMUNITY)
Admission: RE | Admit: 2014-12-13 | Discharge: 2014-12-15 | DRG: 483 | Disposition: A | Discharge status: Home / Self Care | Payer: Medicare Other | Source: Ambulatory Visit | Attending: Orthopedic Surgery | Admitting: Orthopedic Surgery

## 2014-12-13 ENCOUNTER — Inpatient Hospital Stay (HOSPITAL_COMMUNITY): Payer: Medicare Other

## 2014-12-13 ENCOUNTER — Encounter (HOSPITAL_COMMUNITY)
Admission: RE | Disposition: A | Discharge status: Home / Self Care | Payer: Self-pay | Source: Ambulatory Visit | Attending: Orthopedic Surgery

## 2014-12-13 ENCOUNTER — Inpatient Hospital Stay (HOSPITAL_COMMUNITY): Payer: Medicare Other | Admitting: Vascular Surgery

## 2014-12-13 DIAGNOSIS — H8109 Meniere's disease, unspecified ear: Secondary | ICD-10-CM | POA: Diagnosis present

## 2014-12-13 DIAGNOSIS — Z7982 Long term (current) use of aspirin: Secondary | ICD-10-CM | POA: Diagnosis not present

## 2014-12-13 DIAGNOSIS — M25511 Pain in right shoulder: Secondary | ICD-10-CM | POA: Diagnosis present

## 2014-12-13 DIAGNOSIS — H9190 Unspecified hearing loss, unspecified ear: Secondary | ICD-10-CM | POA: Diagnosis present

## 2014-12-13 DIAGNOSIS — M13811 Other specified arthritis, right shoulder: Principal | ICD-10-CM | POA: Diagnosis present

## 2014-12-13 DIAGNOSIS — Z87891 Personal history of nicotine dependence: Secondary | ICD-10-CM

## 2014-12-13 DIAGNOSIS — M810 Age-related osteoporosis without current pathological fracture: Secondary | ICD-10-CM | POA: Diagnosis present

## 2014-12-13 DIAGNOSIS — E782 Mixed hyperlipidemia: Secondary | ICD-10-CM | POA: Diagnosis present

## 2014-12-13 DIAGNOSIS — K219 Gastro-esophageal reflux disease without esophagitis: Secondary | ICD-10-CM | POA: Diagnosis not present

## 2014-12-13 DIAGNOSIS — Z9621 Cochlear implant status: Secondary | ICD-10-CM | POA: Diagnosis present

## 2014-12-13 DIAGNOSIS — M75101 Unspecified rotator cuff tear or rupture of right shoulder, not specified as traumatic: Secondary | ICD-10-CM | POA: Diagnosis not present

## 2014-12-13 DIAGNOSIS — Z8249 Family history of ischemic heart disease and other diseases of the circulatory system: Secondary | ICD-10-CM | POA: Diagnosis not present

## 2014-12-13 DIAGNOSIS — Z833 Family history of diabetes mellitus: Secondary | ICD-10-CM

## 2014-12-13 DIAGNOSIS — M797 Fibromyalgia: Secondary | ICD-10-CM | POA: Diagnosis present

## 2014-12-13 DIAGNOSIS — Z79899 Other long term (current) drug therapy: Secondary | ICD-10-CM | POA: Diagnosis not present

## 2014-12-13 DIAGNOSIS — M75100 Unspecified rotator cuff tear or rupture of unspecified shoulder, not specified as traumatic: Secondary | ICD-10-CM | POA: Diagnosis present

## 2014-12-13 DIAGNOSIS — Z96611 Presence of right artificial shoulder joint: Secondary | ICD-10-CM

## 2014-12-13 DIAGNOSIS — M12819 Other specific arthropathies, not elsewhere classified, unspecified shoulder: Secondary | ICD-10-CM | POA: Diagnosis present

## 2014-12-13 HISTORY — PX: REVERSE SHOULDER ARTHROPLASTY: SHX5054

## 2014-12-13 SURGERY — ARTHROPLASTY, SHOULDER, TOTAL, REVERSE
Anesthesia: General | Site: Shoulder | Laterality: Right

## 2014-12-13 MED ORDER — ROCURONIUM BROMIDE 100 MG/10ML IV SOLN
INTRAVENOUS | Status: DC | PRN
  Administered 2014-12-13: 30 mg via INTRAVENOUS

## 2014-12-13 MED ORDER — ONDANSETRON HCL 4 MG/2ML IJ SOLN
INTRAMUSCULAR | Status: DC | PRN
Start: 2014-12-13 — End: 2014-12-13
  Administered 2014-12-13: 4 mg via INTRAVENOUS

## 2014-12-13 MED ORDER — DIPHENHYDRAMINE HCL 12.5 MG/5ML PO ELIX: 12.5000 mg | ORAL_SOLUTION | ORAL | Status: DC | PRN

## 2014-12-13 MED ORDER — LIDOCAINE HCL 4 % MT SOLN
OROMUCOSAL | Status: DC | PRN
  Administered 2014-12-13: 4 mL via TOPICAL

## 2014-12-13 MED ORDER — MIDAZOLAM HCL 2 MG/2ML IJ SOLN
INTRAMUSCULAR | Status: AC
  Filled 2014-12-13: qty 2

## 2014-12-13 MED ORDER — ACETAMINOPHEN 650 MG RE SUPP: 650.0000 mg | Freq: Four times a day (QID) | RECTAL | Status: DC | PRN

## 2014-12-13 MED ORDER — LAMOTRIGINE 200 MG PO TABS
200.0000 mg | ORAL_TABLET | Freq: Every day | ORAL | Status: DC
  Administered 2014-12-14: 200 mg via ORAL
  Filled 2014-12-13 (×3): qty 1

## 2014-12-13 MED ORDER — ROCURONIUM BROMIDE 50 MG/5ML IV SOLN
INTRAVENOUS | Status: AC
  Filled 2014-12-13: qty 1

## 2014-12-13 MED ORDER — ACETAMINOPHEN 325 MG PO TABS
650.0000 mg | ORAL_TABLET | Freq: Four times a day (QID) | ORAL | Status: DC | PRN
  Administered 2014-12-14 – 2014-12-15 (×2): 650 mg via ORAL
  Filled 2014-12-13 (×2): qty 2

## 2014-12-13 MED ORDER — CLONAZEPAM 0.5 MG PO TABS
0.2500 mg | ORAL_TABLET | Freq: Every day | ORAL | Status: DC
  Administered 2014-12-13 – 2014-12-14 (×2): 0.25 mg via ORAL
  Filled 2014-12-13 (×2): qty 1

## 2014-12-13 MED ORDER — HYDROMORPHONE HCL 2 MG PO TABS
2.0000 mg | ORAL_TABLET | ORAL | Status: DC | PRN
  Administered 2014-12-13 – 2014-12-14 (×2): 2 mg via ORAL
  Filled 2014-12-13 (×2): qty 1

## 2014-12-13 MED ORDER — ONDANSETRON HCL 4 MG PO TABS: 4.0000 mg | ORAL_TABLET | Freq: Four times a day (QID) | ORAL | Status: DC | PRN

## 2014-12-13 MED ORDER — MIDAZOLAM HCL 5 MG/5ML IJ SOLN
INTRAMUSCULAR | Status: DC | PRN
  Administered 2014-12-13: 2 mg via INTRAVENOUS

## 2014-12-13 MED ORDER — SODIUM CHLORIDE 0.9 % IV SOLN
INTRAVENOUS | Status: DC
  Administered 2014-12-13 – 2014-12-14 (×2): via INTRAVENOUS

## 2014-12-13 MED ORDER — LACTATED RINGERS IV SOLN
INTRAVENOUS | Status: DC | PRN
  Administered 2014-12-13: 07:00:00 via INTRAVENOUS

## 2014-12-13 MED ORDER — GLYCOPYRROLATE 0.2 MG/ML IJ SOLN
INTRAMUSCULAR | Status: DC | PRN
  Administered 2014-12-13: 0.4 mg via INTRAVENOUS

## 2014-12-13 MED ORDER — BISACODYL 10 MG RE SUPP: 10.0000 mg | Freq: Every day | RECTAL | Status: DC | PRN

## 2014-12-13 MED ORDER — FENTANYL CITRATE 0.05 MG/ML IJ SOLN
INTRAMUSCULAR | Status: DC | PRN
  Administered 2014-12-13: 100 ug via INTRAVENOUS

## 2014-12-13 MED ORDER — BUPROPION HCL ER (XL) 150 MG PO TB24
300.0000 mg | ORAL_TABLET | Freq: Every day | ORAL | Status: DC
  Administered 2014-12-14 – 2014-12-15 (×2): 300 mg via ORAL
  Filled 2014-12-13 (×2): qty 2

## 2014-12-13 MED ORDER — ASENAPINE MALEATE 5 MG SL SUBL
10.0000 mg | SUBLINGUAL_TABLET | Freq: Every day | SUBLINGUAL | Status: DC
  Administered 2014-12-13 – 2014-12-14 (×2): 10 mg via SUBLINGUAL
  Filled 2014-12-13 (×6): qty 2

## 2014-12-13 MED ORDER — CHLORTHALIDONE 50 MG PO TABS
50.0000 mg | ORAL_TABLET | Freq: Every day | ORAL | Status: DC
  Administered 2014-12-14 – 2014-12-15 (×2): 50 mg via ORAL
  Filled 2014-12-13 (×2): qty 1

## 2014-12-13 MED ORDER — DEXAMETHASONE SODIUM PHOSPHATE 4 MG/ML IJ SOLN
INTRAMUSCULAR | Status: DC | PRN
  Administered 2014-12-13: 4 mg via INTRAVENOUS

## 2014-12-13 MED ORDER — FLEET ENEMA 7-19 GM/118ML RE ENEM: 1.0000 | ENEMA | Freq: Once | RECTAL | Status: AC | PRN

## 2014-12-13 MED ORDER — ONDANSETRON HCL 4 MG/2ML IJ SOLN
INTRAMUSCULAR | Status: AC
  Filled 2014-12-13: qty 2

## 2014-12-13 MED ORDER — HYDROMORPHONE HCL 1 MG/ML IJ SOLN
INTRAMUSCULAR | Status: AC
  Filled 2014-12-13: qty 1

## 2014-12-13 MED ORDER — SODIUM CHLORIDE 0.9 % IR SOLN
Status: DC | PRN
  Administered 2014-12-13: 3000 mL
  Administered 2014-12-13: 1000 mL

## 2014-12-13 MED ORDER — DULOXETINE HCL 60 MG PO CPEP
60.0000 mg | ORAL_CAPSULE | Freq: Every day | ORAL | Status: DC
  Administered 2014-12-14 – 2014-12-15 (×2): 60 mg via ORAL
  Filled 2014-12-13: qty 1
  Filled 2014-12-13: qty 2

## 2014-12-13 MED ORDER — ASENAPINE MALEATE 10 MG SL SUBL
10.0000 mg | SUBLINGUAL_TABLET | Freq: Every day | SUBLINGUAL | Status: DC
Start: 2014-12-13 — End: 2014-12-13

## 2014-12-13 MED ORDER — SUCCINYLCHOLINE CHLORIDE 20 MG/ML IJ SOLN
INTRAMUSCULAR | Status: AC
  Filled 2014-12-13: qty 1

## 2014-12-13 MED ORDER — PROPOFOL 10 MG/ML IV BOLUS
INTRAVENOUS | Status: DC | PRN
  Administered 2014-12-13: 110 mg via INTRAVENOUS

## 2014-12-13 MED ORDER — GLYCOPYRROLATE 0.2 MG/ML IJ SOLN
INTRAMUSCULAR | Status: AC
  Filled 2014-12-13: qty 2

## 2014-12-13 MED ORDER — BUPIVACAINE-EPINEPHRINE (PF) 0.5% -1:200000 IJ SOLN
INTRAMUSCULAR | Status: DC | PRN
  Administered 2014-12-13: 20 mL via PERINEURAL

## 2014-12-13 MED ORDER — METOCLOPRAMIDE HCL 5 MG/ML IJ SOLN: 5.0000 mg | Freq: Three times a day (TID) | INTRAMUSCULAR | Status: DC | PRN

## 2014-12-13 MED ORDER — MENTHOL 3 MG MT LOZG: 1.0000 | LOZENGE | OROMUCOSAL | Status: DC | PRN

## 2014-12-13 MED ORDER — CEFAZOLIN SODIUM-DEXTROSE 2-3 GM-% IV SOLR
INTRAVENOUS | Status: AC
  Filled 2014-12-13: qty 50

## 2014-12-13 MED ORDER — ONDANSETRON HCL 4 MG/2ML IJ SOLN: 4.0000 mg | Freq: Four times a day (QID) | INTRAMUSCULAR | Status: DC | PRN

## 2014-12-13 MED ORDER — EPHEDRINE SULFATE 50 MG/ML IJ SOLN
INTRAMUSCULAR | Status: AC
Start: 2014-12-13 — End: 2014-12-13
  Filled 2014-12-13: qty 1

## 2014-12-13 MED ORDER — POLYETHYLENE GLYCOL 3350 17 G PO PACK: 17.0000 g | PACK | Freq: Every day | ORAL | Status: DC | PRN

## 2014-12-13 MED ORDER — LIDOCAINE HCL (CARDIAC) 20 MG/ML IV SOLN
INTRAVENOUS | Status: AC
  Filled 2014-12-13: qty 5

## 2014-12-13 MED ORDER — CEFAZOLIN SODIUM 1-5 GM-% IV SOLN
1.0000 g | Freq: Four times a day (QID) | INTRAVENOUS | Status: AC
  Administered 2014-12-13 – 2014-12-14 (×3): 1 g via INTRAVENOUS
  Filled 2014-12-13 (×4): qty 50

## 2014-12-13 MED ORDER — HYDROCHLOROTHIAZIDE 25 MG PO TABS
25.0000 mg | ORAL_TABLET | Freq: Every day | ORAL | Status: DC
  Administered 2014-12-14 – 2014-12-15 (×2): 25 mg via ORAL
  Filled 2014-12-13 (×2): qty 1

## 2014-12-13 MED ORDER — SODIUM CHLORIDE 0.9 % IJ SOLN
INTRAMUSCULAR | Status: AC
  Filled 2014-12-13: qty 10

## 2014-12-13 MED ORDER — SODIUM CHLORIDE 0.9 % IV SOLN
10.0000 mg | INTRAVENOUS | Status: DC | PRN
  Administered 2014-12-13: 25 ug/min via INTRAVENOUS

## 2014-12-13 MED ORDER — ALUMINUM HYDROXIDE GEL 320 MG/5ML PO SUSP
15.0000 mL | ORAL | Status: DC | PRN
  Filled 2014-12-13: qty 30

## 2014-12-13 MED ORDER — NEOSTIGMINE METHYLSULFATE 10 MG/10ML IV SOLN
INTRAVENOUS | Status: DC | PRN
  Administered 2014-12-13: 2.5 mg via INTRAVENOUS

## 2014-12-13 MED ORDER — MONTELUKAST SODIUM 10 MG PO TABS
10.0000 mg | ORAL_TABLET | Freq: Every day | ORAL | Status: DC
  Administered 2014-12-13 – 2014-12-14 (×2): 10 mg via ORAL
  Filled 2014-12-13 (×2): qty 1

## 2014-12-13 MED ORDER — PHENOL 1.4 % MT LIQD: 1.0000 | OROMUCOSAL | Status: DC | PRN

## 2014-12-13 MED ORDER — HYDROMORPHONE HCL 1 MG/ML IJ SOLN
1.0000 mg | INTRAMUSCULAR | Status: DC | PRN
  Administered 2014-12-13 – 2014-12-14 (×3): 1 mg via INTRAVENOUS
  Filled 2014-12-13 (×2): qty 1

## 2014-12-13 MED ORDER — PROPOFOL 10 MG/ML IV BOLUS
INTRAVENOUS | Status: AC
  Filled 2014-12-13: qty 20

## 2014-12-13 MED ORDER — METOCLOPRAMIDE HCL 10 MG PO TABS: 5.0000 mg | ORAL_TABLET | Freq: Three times a day (TID) | ORAL | Status: DC | PRN

## 2014-12-13 MED ORDER — FENTANYL CITRATE 0.05 MG/ML IJ SOLN
INTRAMUSCULAR | Status: AC
  Filled 2014-12-13: qty 5

## 2014-12-13 MED ORDER — ASPIRIN EC 325 MG PO TBEC
325.0000 mg | DELAYED_RELEASE_TABLET | Freq: Two times a day (BID) | ORAL | Status: DC
  Administered 2014-12-13 – 2014-12-15 (×4): 325 mg via ORAL
  Filled 2014-12-13 (×4): qty 1

## 2014-12-13 MED ORDER — ATORVASTATIN CALCIUM 10 MG PO TABS
10.0000 mg | ORAL_TABLET | Freq: Every day | ORAL | Status: DC
  Administered 2014-12-13 – 2014-12-14 (×2): 10 mg via ORAL
  Filled 2014-12-13 (×2): qty 1

## 2014-12-13 MED ORDER — DOCUSATE SODIUM 100 MG PO CAPS
100.0000 mg | ORAL_CAPSULE | Freq: Two times a day (BID) | ORAL | Status: DC
  Administered 2014-12-13 – 2014-12-15 (×5): 100 mg via ORAL
  Filled 2014-12-13 (×4): qty 1

## 2014-12-13 SURGICAL SUPPLY — 73 items
ALIGNMENT PIN-SMOOTH ×2 IMPLANT
BIT DRILL 3.0 (BIT) ×1 IMPLANT
BIT DRILL HEX (BIT) ×1 IMPLANT
BLADE SAW SAG 73X25 THK (BLADE) ×1
BLADE SAW SGTL 73X25 THK (BLADE) ×1 IMPLANT
BLADE SURG 15 STRL LF DISP TIS (BLADE) ×1 IMPLANT
BLADE SURG 15 STRL SS (BLADE) ×1
BOWL SMART MIX CTS (DISPOSABLE) ×2 IMPLANT
CEMENT BONE SIMPLEX SPEEDSET (Cement) ×2 IMPLANT
CHLORAPREP W/TINT 26ML (MISCELLANEOUS) ×2 IMPLANT
COVER MAYO STAND STRL (DRAPES) IMPLANT
COVER SURGICAL LIGHT HANDLE (MISCELLANEOUS) ×2 IMPLANT
DRAPE INCISE IOBAN 66X45 STRL (DRAPES) ×4 IMPLANT
DRAPE ORTHO SPLIT 77X108 STRL (DRAPES) ×2
DRAPE SURG 17X23 STRL (DRAPES) ×2 IMPLANT
DRAPE SURG ORHT 6 SPLT 77X108 (DRAPES) ×2 IMPLANT
DRAPE U-SHAPE 47X51 STRL (DRAPES) ×2 IMPLANT
DRILL BIT 3.0 (BIT) ×1
DRILL BIT 5/64 (BIT) ×2 IMPLANT
DRILL BIT HEX (BIT) ×2
DRSG AQUACEL AG ADV 3.5X10 (GAUZE/BANDAGES/DRESSINGS) ×2 IMPLANT
ELECT BLADE 4.0 EZ CLEAN MEGAD (MISCELLANEOUS) ×2
ELECT REM PT RETURN 9FT ADLT (ELECTROSURGICAL) ×2
ELECTRODE BLDE 4.0 EZ CLN MEGD (MISCELLANEOUS) ×1 IMPLANT
ELECTRODE REM PT RTRN 9FT ADLT (ELECTROSURGICAL) ×1 IMPLANT
EVACUATOR 1/8 PVC DRAIN (DRAIN) IMPLANT
GLOVE BIO SURGEON STRL SZ7 (GLOVE) ×2 IMPLANT
GLOVE BIO SURGEON STRL SZ7.5 (GLOVE) ×2 IMPLANT
GLOVE BIOGEL PI IND STRL 7.0 (GLOVE) ×1 IMPLANT
GLOVE BIOGEL PI IND STRL 8 (GLOVE) ×1 IMPLANT
GLOVE BIOGEL PI INDICATOR 7.0 (GLOVE) ×1
GLOVE BIOGEL PI INDICATOR 8 (GLOVE) ×1
GOWN STRL REUS W/ TWL LRG LVL3 (GOWN DISPOSABLE) ×3 IMPLANT
GOWN STRL REUS W/ TWL XL LVL3 (GOWN DISPOSABLE) ×1 IMPLANT
GOWN STRL REUS W/TWL LRG LVL3 (GOWN DISPOSABLE) ×3
GOWN STRL REUS W/TWL XL LVL3 (GOWN DISPOSABLE) ×1
HANDPIECE INTERPULSE COAX TIP (DISPOSABLE) ×1
HOOD PEEL AWAY FACE SHEILD DIS (HOOD) ×6 IMPLANT
KIT BASIN OR (CUSTOM PROCEDURE TRAY) ×2 IMPLANT
KIT ROOM TURNOVER OR (KITS) ×2 IMPLANT
MANIFOLD NEPTUNE II (INSTRUMENTS) ×2 IMPLANT
NEEDLE HYPO 25GX1X1/2 BEV (NEEDLE) IMPLANT
NEEDLE MAYO TROCAR (NEEDLE) IMPLANT
NOZZLE PRISM 8.5MM (MISCELLANEOUS) IMPLANT
NS IRRIG 1000ML POUR BTL (IV SOLUTION) ×2 IMPLANT
PACK SHOULDER (CUSTOM PROCEDURE TRAY) ×2 IMPLANT
PAD ARMBOARD 7.5X6 YLW CONV (MISCELLANEOUS) ×4 IMPLANT
RETRIEVER SUT HEWSON (MISCELLANEOUS) IMPLANT
REVERSE TSA LEVEL 1 SHOULDER (Capitated) ×2 IMPLANT
SET HNDPC FAN SPRY TIP SCT (DISPOSABLE) ×1 IMPLANT
SLING ARM IMMOBILIZER LRG (SOFTGOODS) ×2 IMPLANT
SLING ARM IMMOBILIZER MED (SOFTGOODS) IMPLANT
SPONGE LAP 18X18 X RAY DECT (DISPOSABLE) ×2 IMPLANT
SPONGE LAP 4X18 X RAY DECT (DISPOSABLE) IMPLANT
STRIP CLOSURE SKIN 1/2X4 (GAUZE/BANDAGES/DRESSINGS) ×2 IMPLANT
SUCTION FRAZIER TIP 10 FR DISP (SUCTIONS) ×2 IMPLANT
SUPPORT WRAP ARM LG (MISCELLANEOUS) ×2 IMPLANT
SUT ETHIBOND NAB CT1 #1 30IN (SUTURE) ×2 IMPLANT
SUT FIBERWIRE #2 38 T-5 BLUE (SUTURE)
SUT MNCRL AB 4-0 PS2 18 (SUTURE) ×2 IMPLANT
SUT SILK 2 0 TIES 17X18 (SUTURE)
SUT SILK 2-0 18XBRD TIE BLK (SUTURE) IMPLANT
SUT VIC AB 0 CTB1 27 (SUTURE) ×2 IMPLANT
SUT VIC AB 2-0 CT1 27 (SUTURE) ×1
SUT VIC AB 2-0 CT1 TAPERPNT 27 (SUTURE) ×1 IMPLANT
SUTURE FIBERWR #2 38 T-5 BLUE (SUTURE) IMPLANT
SYR CONTROL 10ML LL (SYRINGE) IMPLANT
SYRINGE TOOMEY DISP (SYRINGE) IMPLANT
TAPE FIBER 2MM 7IN #2 BLUE (SUTURE) IMPLANT
TOWEL OR 17X24 6PK STRL BLUE (TOWEL DISPOSABLE) ×2 IMPLANT
TOWEL OR 17X26 10 PK STRL BLUE (TOWEL DISPOSABLE) ×2 IMPLANT
WATER STERILE IRR 1000ML POUR (IV SOLUTION) IMPLANT
YANKAUER SUCT BULB TIP NO VENT (SUCTIONS) ×2 IMPLANT

## 2014-12-13 NOTE — Op Note (Signed)
Procedure(s): REVERSE SHOULDER ARTHROPLASTY Procedure Note  Elizabeth Stone female 69 y.o. 12/13/2014  Procedure(s) and Anesthesia Type:    * RIGHT REVERSE SHOULDER ARTHROPLASTY - Choice   Indications:  69 y.o. female  With endstage right shoulder arthritis with irrepairable rotator cuff tear. Pain and dysfunction interfered with quality of life and nonoperative treatment with activity modification, NSAIDS and injections failed.     Surgeon: Nita Sells   Assistants: Jeanmarie Hubert PA-C [facility] was present and scrubbed throughout the procedure and was essential in positioning, retraction, exposure, and closure)  Anesthesia: General endotracheal anesthesia with preoperative interscalene block given by the attending anesthesiologist    Procedure Detail  REVERSE SHOULDER ARTHROPLASTY   Estimated Blood Loss:  200 mL         Drains: none  Blood Given: none          Specimens: none        Complications:  * No complications entered in OR log *         Disposition: PACU - hemodynamically stable.         Condition: stable      OPERATIVE FINDINGS:  A Tornier Aqualis cemented reverse total shoulder arthroplasty was placed with a  size 9 stem, a 36 glenosphere, and a 6-mm poly insert. The base plate  fixation was excellent with a threaded post.  PROCEDURE: The patient was identified in the preoperative holding area  where I personally marked the operative site after verifying site, side,  and procedure with the patient. An interscalene block given by  the attending anesthesiologist in the holding area and the patient was taken back to the operating room where all extremities were  carefully padded in position after general anesthesia was induced. She  was placed in a beach-chair position and the operative upper extremity was  prepped and draped in a standard sterile fashion. An approximately 10-  cm incision was made from the tip of the coracoid process to the  center  point of the humerus at the level of the axilla. Dissection was carried  down through subcutaneous tissues to the level of the cephalic vein  which was taken laterally with the deltoid. The pectoralis major was  retracted medially. The subdeltoid space was developed and the lateral  edge of the conjoined tendon was identified. The undersurface of  conjoined tendon was palpated and the musculocutaneous nerve was not in  the field. Retractor was placed underneath the conjoined and second  retractor was placed lateral into the deltoid. The circumflex humeral  artery and vessels were identified and clamped and coagulated. The  biceps tendon was absent.  The subscapularis was chronically torn.  The  joint was then gently externally rotated while the capsule was released  from the humeral neck around to just beyond the 6 o'clock position. At  this point, the joint was dislocated and the humeral head was presented  into the wound. The excessive osteophyte formation was removed with a  large rongeur.  The cutting guide was used to make the appropriate  head cut and the head was saved for potentially bone grafting.  The glenoid was exposed with the arm in an  abducted extended position. The anterior and posterior labrum were  completely excised and the capsule was released circumferentially to  allow for exposure of the glenoid for preparation. The guidepin was  placed using the guide in neutral angulation and the reamer was  placed over the guidepin to ream down to concentric  surface. Superior  hand reamer was used as well. The central drill hole was then made after measuring for the central threaded peg. The Metaglene was then screwed in with excellent purchase. The superior and posterior screws were then drilled, measured, and filled with appropriate-sized locking screws. Fixation was good.  The Glenosphere was then impacted and screwed in place.  The humerus was then again exposed and the  metaphyseal reamer was used, followed by diaphyseal reamers. The trial was then placed and the poly trials were tested and the above implant was felt to be the most appropriate soft tissue tensioning with excellent stability and  excellent range of motion. It was a bit tight going in. Therefore, final humeral stem was placed cemented. The joint was reduced taken through full range of motion and felt to be stable. Soft tissue tension was appropriate, a bit on the tight side, but not overly tight.  The joint was then copiously irrigated with pulse  lavage and the wound was then closed. The subscapularis was not repaired.  Skin was closed with 2-0 Vicryl in a deep dermal layer and 4-0  Monocryl for skin closure. Steri-Strips were applied. Sterile  dressings were then applied as well as a sling. The patient was allowed  to awaken from general anesthesia, transferred to stretcher, and taken  to recovery room in stable condition.   POSTOPERATIVE PLAN: The patient will be kept in the hospital postoperatively  for pain control and therapy.

## 2014-12-13 NOTE — Transfer of Care (Signed)
Immediate Anesthesia Transfer of Care Note  Patient: Elizabeth Stone  Procedure(s) Performed: Procedure(s) with comments: REVERSE SHOULDER ARTHROPLASTY (Right) - Right reverse total shoulder replacement  Patient Location: PACU  Anesthesia Type:General  Level of Consciousness: awake, alert  and oriented  Airway & Oxygen Therapy: Patient Spontanous Breathing and Patient connected to nasal cannula oxygen  Post-op Assessment: Report given to RN, Post -op Vital signs reviewed and stable and Patient moving all extremities  Post vital signs: Reviewed and stable  Last Vitals:  Filed Vitals:   12/13/14 0615  BP: 140/68  Pulse: 84  Temp: 36.7 C  Resp: 20    Complications: No apparent anesthesia complications

## 2014-12-13 NOTE — Anesthesia Preprocedure Evaluation (Signed)
Anesthesia Evaluation  Patient identified by MRN, date of birth, ID band Patient awake    Reviewed: Allergy & Precautions, NPO status , Patient's Chart, lab work & pertinent test results  History of Anesthesia Complications (+) PONV  Airway        Dental  (+) Dental Advisory Given   Pulmonary former smoker,          Cardiovascular     Neuro/Psych Anxiety Depression    GI/Hepatic GERD-  Medicated,  Endo/Other    Renal/GU      Musculoskeletal  (+) Arthritis -, Fibromyalgia -  Abdominal   Peds  Hematology   Anesthesia Other Findings   Reproductive/Obstetrics                             Anesthesia Physical Anesthesia Plan  ASA: II  Anesthesia Plan: General   Post-op Pain Management:    Induction: Intravenous  Airway Management Planned: Oral ETT  Additional Equipment:   Intra-op Plan:   Post-operative Plan: Extubation in OR  Informed Consent: I have reviewed the patients History and Physical, chart, labs and discussed the procedure including the risks, benefits and alternatives for the proposed anesthesia with the patient or authorized representative who has indicated his/her understanding and acceptance.   Dental advisory given  Plan Discussed with: Anesthesiologist and Surgeon  Anesthesia Plan Comments:         Anesthesia Quick Evaluation

## 2014-12-13 NOTE — Anesthesia Postprocedure Evaluation (Signed)
  Anesthesia Post-op Note  Patient: Elizabeth Stone  Procedure(s) Performed: Procedure(s) with comments: REVERSE SHOULDER ARTHROPLASTY (Right) - Right reverse total shoulder replacement  Patient Location: PACU  Anesthesia Type:General and Regional  Level of Consciousness: awake  Airway and Oxygen Therapy: Patient Spontanous Breathing and Patient connected to nasal cannula oxygen  Post-op Pain: mild  Post-op Assessment: Post-op Vital signs reviewed, Patient's Cardiovascular Status Stable, Respiratory Function Stable, Patent Airway, No signs of Nausea or vomiting and Pain level controlled  Post-op Vital Signs: Reviewed and stable  Last Vitals:  Filed Vitals:   12/13/14 1244  BP: 107/54  Pulse: 84  Temp: 36.9 C  Resp: 16    Complications: No apparent anesthesia complications

## 2014-12-13 NOTE — Discharge Instructions (Signed)

## 2014-12-13 NOTE — Progress Notes (Signed)
Hearing aids in place 

## 2014-12-13 NOTE — Anesthesia Procedure Notes (Addendum)
Procedure Name: Intubation Date/Time: 12/13/2014 7:48 AM Performed by: Kyung Rudd Pre-anesthesia Checklist: Patient identified, Emergency Drugs available, Suction available, Patient being monitored and Timeout performed Patient Re-evaluated:Patient Re-evaluated prior to inductionOxygen Delivery Method: Circle system utilized Preoxygenation: Pre-oxygenation with 100% oxygen Intubation Type: IV induction Ventilation: Mask ventilation without difficulty Laryngoscope Size: Mac and 3 Grade View: Grade II Tube type: Oral Tube size: 7.5 mm Number of attempts: 1 Airway Equipment and Method: Stylet Placement Confirmation: ETT inserted through vocal cords under direct vision,  positive ETCO2 and breath sounds checked- equal and bilateral Secured at: 21 cm Tube secured with: Tape   Anesthesia Regional Block:  Interscalene brachial plexus block  Pre-Anesthetic Checklist: ,, timeout performed, Correct Patient, Correct Site, Correct Laterality, Correct Procedure, Correct Position, site marked, Risks and benefits discussed,  Surgical consent,  Pre-op evaluation,  At surgeon's request and post-op pain management  Laterality: Upper and Right  Prep: chloraprep       Needles:  Injection technique: Single-shot  Needle Type: Echogenic Stimulator Needle          Additional Needles:  Procedures: ultrasound guided (picture in chart) and nerve stimulator Interscalene brachial plexus block  Nerve Stimulator or Paresthesia:  Response: deltoid, 0.5 mA,   Additional Responses:   Narrative:  Injection made incrementally with aspirations every 5 mL.  Performed by: Personally  Anesthesiologist: Ronia Hazelett, CHRIS  Additional Notes: H+P and labs reviewed, risks and benefits discussed with patient, procedure tolerated well without complications

## 2014-12-13 NOTE — H&P (Signed)
Elizabeth Stone is an 69 y.o. female.   Chief Complaint: R shoulder pain and dysfunction HPI: R shoulder endstage rotator cuff tear arthropathy, failed conservative management of injections therapy, activity modification, nsaids.  Past Medical History  Diagnosis Date  . Arthritis   . Anxiety   . Depression   . GERD (gastroesophageal reflux disease)   . Meniere disease     takes HCTZ for her inner ear problem  . Osteoporosis   . Hearing impaired     has cochlear implant  . Mixed hyperlipidemia 09/27/2013  . PONV (postoperative nausea and vomiting)   . Fibromyalgia     Past Surgical History  Procedure Laterality Date  . Abdominal hysterectomy    . Bladder tack    . Cochlear implant      2007  . Hammer toe surgery      left foot    Family History  Problem Relation Age of Onset  . Colon cancer Neg Hx   . Esophageal cancer Neg Hx   . Rectal cancer Neg Hx   . Stomach cancer Neg Hx   . Heart disease Mother   . Breast cancer Mother   . Cervical cancer Mother   . Diabetes Mother   . Heart disease Father   . Diabetes Maternal Aunt    Social History:  reports that she quit smoking about 21 years ago. She does not have any smokeless tobacco history on file. She reports that she drinks about 3.6 oz of alcohol per week. She reports that she does not use illicit drugs.  Allergies: No Known Allergies  Medications Prior to Admission  Medication Sig Dispense Refill  . aspirin 81 MG tablet Take 81 mg by mouth daily.    Marland Kitchen atorvastatin (LIPITOR) 10 MG tablet Take 10 mg by mouth daily.    Marland Kitchen buPROPion (WELLBUTRIN XL) 300 MG 24 hr tablet Take 300 mg by mouth Daily.     Marland Kitchen CALCIUM PO Take 1 tablet by mouth 2 (two) times daily.    . CELEBREX 200 MG capsule Take 200 mg by mouth Daily.     . chlorthalidone (HYGROTON) 50 MG tablet Take 50 mg by mouth Daily.     . clonazePAM (KLONOPIN) 0.5 MG tablet Take 0.25 mg by mouth at bedtime. Can take up to 2 tabs at bedtime    . CYMBALTA 60 MG capsule  Take 60 mg by mouth Daily.     . hydrochlorothiazide (HYDRODIURIL) 25 MG tablet Take 25 mg by mouth Daily.     Marland Kitchen HYDROcodone-acetaminophen (NORCO/VICODIN) 5-325 MG per tablet Take 0.5 tablets by mouth daily as needed for moderate pain.    Marland Kitchen KLOR-CON M20 20 MEQ tablet Take 20 mEq by mouth 2 (two) times daily.     Marland Kitchen lamoTRIgine (LAMICTAL) 200 MG tablet Take 200 mg by mouth daily.     . montelukast (SINGULAIR) 10 MG tablet Take 10 mg by mouth at bedtime.     Marland Kitchen NEXIUM 40 MG capsule Take 40 mg by mouth every other day.     . Omega-3 Fatty Acids (FISH OIL) 1200 MG CAPS Take 1,200 mg by mouth daily.     Marland Kitchen SAPHRIS 10 MG SUBL Take 10 mg by mouth At bedtime.     . vitamin C (ASCORBIC ACID) 500 MG tablet Take 500 mg by mouth 2 (two) times daily.      No results found for this or any previous visit (from the past 48 hour(s)). No results found.  Review of Systems  All other systems reviewed and are negative.   Blood pressure 140/68, pulse 84, temperature 98.1 F (36.7 C), temperature source Oral, resp. rate 20, height 5' 3.25" (1.607 m), weight 67.586 kg (149 lb), SpO2 95 %. Physical Exam  Constitutional: She is oriented to person, place, and time. She appears well-developed and well-nourished.  HENT:  Head: Atraumatic.  Eyes: EOM are normal.  Cardiovascular: Intact distal pulses.   Respiratory: Effort normal.  Musculoskeletal:  R shoulder pain with limited ROM, NVID  Neurological: She is alert and oriented to person, place, and time.  Skin: Skin is warm and dry.  Psychiatric: She has a normal mood and affect.     Assessment/Plan R shoulder rotator cuff tear arthopathy Plan R reverse total shoulder arthroplasty Risks / benefits of surgery discussed Consent on chart  NPO for OR Preop antibiotics   Ndrew Creason WILLIAM 12/13/2014, 7:20 AM

## 2014-12-14 LAB — CBC
HCT: 34.6 % — ABNORMAL LOW (ref 36.0–46.0)
HEMOGLOBIN: 11.8 g/dL — AB (ref 12.0–15.0)
MCH: 33.3 pg (ref 26.0–34.0)
MCHC: 34.1 g/dL (ref 30.0–36.0)
MCV: 97.7 fL (ref 78.0–100.0)
Platelets: 162 10*3/uL (ref 150–400)
RBC: 3.54 MIL/uL — AB (ref 3.87–5.11)
RDW: 12.3 % (ref 11.5–15.5)
WBC: 7.4 10*3/uL (ref 4.0–10.5)

## 2014-12-14 MED ORDER — HYDROMORPHONE HCL 2 MG PO TABS
2.0000 mg | ORAL_TABLET | ORAL | Status: DC | PRN
  Administered 2014-12-14 (×3): 2 mg via ORAL
  Administered 2014-12-14: 4 mg via ORAL
  Filled 2014-12-14 (×2): qty 1
  Filled 2014-12-14: qty 2
  Filled 2014-12-14: qty 1

## 2014-12-14 MED ORDER — DOCUSATE SODIUM 100 MG PO CAPS: 100.0000 mg | ORAL_CAPSULE | Freq: Three times a day (TID) | ORAL | Status: DC | PRN

## 2014-12-14 MED ORDER — HYDROMORPHONE HCL 2 MG PO TABS: 2.0000 mg | ORAL_TABLET | ORAL | Status: DC | PRN

## 2014-12-14 NOTE — Evaluation (Signed)
Occupational Therapy Evaluation Patient Details Name: Elizabeth Stone MRN: [id number] DOB: 20-Jan-1946 Today's Date: 12/14/2014    History of Present Illness This 69 y.o. female admitted for Rt reverse TSA.  PMH includes:  Arthritis; depression; Menier's disease; osteoporosis; fibromyalgia; cochlear implant due to hearing impairment    Clinical Impression   Pt admitted with above. She demonstrates the below listed deficits and will benefit from continued OT to maximize safety and independence with BADLs.  Currently pt requires mod A with BADLs.  She demonstrates impaired balance and h/o falls - recommend PT eval.  Spoke with PA, who reports she will order PT.  Pt will have 24 hour assist at discharge.  Will need to be seen one more time with daughter in law present for eduction.        Follow Up Recommendations  No OT follow up;Supervision/Assistance - 24 hour    Equipment Recommendations  None recommended by OT    Recommendations for Other Services       Precautions / Restrictions Precautions Precautions: Fall Precaution Comments: Pt reports ~ 7 falls in the past year.  She has a h/o Meniere's disease and reports she has bees seen by OPPT in the past  Restrictions Weight Bearing Restrictions: Yes RUE Weight Bearing: Non weight bearing      Mobility Bed Mobility Overal bed mobility: Needs Assistance Bed Mobility: Supine to Sit     Supine to sit: HOB elevated;Min assist     General bed mobility comments: Pt reliant on HOB elevated.  She reports she has an adjustable bed at home.  She required assist scooting to EOB   Transfers Overall transfer level: Needs assistance   Transfers: Sit to/from Stand;Stand Pivot Transfers Sit to Stand: Min assist Stand pivot transfers: Min assist       General transfer comment: Pt unsteady on her feet     Balance Overall balance assessment: Needs assistance Sitting-balance support: Feet supported Sitting balance-Leahy Scale: Good     Standing balance support: Single extremity supported Standing balance-Leahy Scale: Fair                              ADL Overall ADL's : Needs assistance/impaired Eating/Feeding: Set up;Sitting   Grooming: Wash/dry hands;Minimal assistance;Standing Grooming Details (indicate cue type and reason): min A for balance  Upper Body Bathing: Moderate assistance;Sitting Upper Body Bathing Details (indicate cue type and reason): Pt instructed in safety with UB bathing  Lower Body Bathing: Minimal assistance;Sit to/from stand   Upper Body Dressing : Maximal assistance;Sitting   Lower Body Dressing: Minimal assistance;Sit to/from stand   Toilet Transfer: Minimal assistance;Ambulation Toilet Transfer Details (indicate cue type and reason): min A for balance  Toileting- Clothing Manipulation and Hygiene: Minimal assistance;Sit to/from stand       Functional mobility during ADLs: Minimal assistance General ADL Comments: Pt instructed in shoulder precautions.  Pt unable to access Rt axilla with Lt. UE due to limited AROM Lt. shoulder.  She reports daughter in law will be able to provide assist as needed.  Pt is very anxious about tub transfer     Vision     Perception     Praxis      Pertinent Vitals/Pain Pain Assessment: 0-10 Pain Score: 8  Pain Location: rt shoulder Pain Descriptors / Indicators: Aching;Constant Pain Intervention(s): Monitored during session;Limited activity within patient's tolerance;Repositioned;Ice applied;Patient requesting pain meds-RN notified     Hand Dominance Right  Extremity/Trunk Assessment Upper Extremity Assessment Upper Extremity Assessment: RUE deficits/detail RUE Deficits / Details: Elbow flexion llimited to ~110*.    RUE: Unable to fully assess due to immobilization LUE Deficits / Details: Shoulder AROM limited to ~90* shoulder elevation - pt reports this is longstanding with plans for shoulder replacement down the road             Communication Communication Communication: No difficulties   Cognition Arousal/Alertness: Awake/alert Behavior During Therapy: WFL for tasks assessed/performed Overall Cognitive Status: Within Functional Limits for tasks assessed                     General Comments       Exercises Exercises: Shoulder     Shoulder Instructions Shoulder Instructions Donning/doffing shirt without moving shoulder: Maximal assistance Method for sponge bathing under operated UE: Moderate assistance Donning/doffing sling/immobilizer: Maximal assistance Correct positioning of sling/immobilizer: Minimal assistance ROM for elbow, wrist and digits of operated UE: Supervision/safety Sling wearing schedule (on at all times/off for ADL's): Supervision/safety Proper positioning of operated UE when showering: Supervision/safety Positioning of UE while sleeping: Supervision/safety    Home Living Family/patient expects to be discharged to:: Private residence Living Arrangements: Alone Available Help at Discharge: Family;Available 24 hours/day Type of Home: House Home Access: Stairs to enter [employer] of Steps: 9 Entrance Stairs-Rails: Left;Right;Can reach both Home Layout: One level     Bathroom Shower/Tub: Tub/shower unit;Curtain Shower/tub characteristics: Architectural technologist: Standard     Home Equipment: Shower seat          Prior Functioning/Environment Level of Independence: Independent        Comments: Pt reports h/o falls     OT Diagnosis: Generalized weakness;Acute pain   OT Problem List: Decreased strength;Decreased activity tolerance;Impaired balance (sitting and/or standing);Decreased knowledge of use of DME or AE;Decreased knowledge of precautions;Impaired UE functional use;Pain   OT Treatment/Interventions: Self-care/ADL training;Therapeutic exercise;DME and/or AE instruction;Therapeutic activities;Patient/family education;Balance training    OT  Goals(Current goals can be found in the care plan section) Acute Rehab OT Goals Patient Stated Goal: to regain UE function  OT Goal Formulation: With patient Time For Goal Achievement: 12/21/14 Potential to Achieve Goals: Good ADL Goals Pt Will Transfer to Toilet: with supervision;ambulating Pt Will Perform Toileting - Clothing Manipulation and hygiene: with supervision;sit to/from stand Pt Will Perform Tub/Shower Transfer: Tub transfer;with min guard assist;ambulating;shower seat Additional ADL Goal #1: Pt/caregiver will be independent with precautions, BADLs, and sling management   OT Frequency: Min 2X/week   Barriers to D/C:            Co-evaluation              End of Session Nurse Communication: Mobility status;Patient requests pain meds  Activity Tolerance: Patient tolerated treatment well Patient left: in chair;with call bell/phone within reach   Time: 9323-5573 OT Time Calculation (min): 43 min Charges:  OT General Charges $OT Visit: 1 Procedure OT Evaluation $Initial OT Evaluation Tier I: 1 Procedure OT Treatments $Self Care/Home Management : 23-37 mins G-Codes:    Shloimy Michalski M 18-Dec-2014, 1:31 PM

## 2014-12-14 NOTE — Plan of Care (Signed)
Problem: Consults Goal: Diagnosis - Shoulder Surgery Outcome: Completed/Met Date Met:  12/14/14 Reverse Total Shoulder Arthroplasty Right

## 2014-12-14 NOTE — Evaluation (Signed)
Physical Therapy Evaluation Patient Details Name: Elizabeth Stone MRN: [id number] DOB: 1945/10/25 Today's Date: 12/14/2014   History of Present Illness  This 69 y.o. female admitted for Rt reverse TSA.  PMH includes:  Arthritis; depression; Menier's disease; osteoporosis; fibromyalgia; cochlear implant due to hearing impairment   Clinical Impression  Pt demonstrated poor balance during standing and ambulatory activity and will benefit from assessment of stair navigation using a single point cane.  Instructed pt to acquire single point cane to use at all times.  Pt will benefit from HHPT to ensure she can safely navigate around her home Ind w/ single point cane.    Follow Up Recommendations Home health PT    Equipment Recommendations  Cane    Recommendations for Other Services       Precautions / Restrictions Precautions Precautions: Fall Precaution Comments: Pt reports ~ 7 falls in the past year.  She has a h/o Meniere's disease and reports she has bees seen by OPPT in the past  Restrictions Weight Bearing Restrictions: Yes RUE Weight Bearing: Non weight bearing      Mobility  Bed Mobility Overal bed mobility: Needs Assistance Bed Mobility: Supine to Sit;Sit to Supine     Supine to sit: HOB elevated;Min assist Sit to supine: Min assist (easier to get into bed on R side)   General bed mobility comments: Pt reliant on HOB elevated.  She reports she has an adjustable bed at home.   Transfers Overall transfer level: Needs assistance Equipment used: Straight cane Transfers: Sit to/from Stand Sit to Stand: Min assist         General transfer comment: h/o falls and slightly impulsive w/ sit>stand  Ambulation/Gait Ambulation/Gait assistance: Min guard Ambulation Distance (Feet): 60 Feet Assistive device: Straight cane Gait Pattern/deviations: Step-through pattern   Gait velocity interpretation: Below normal speed for age/gender General Gait Details: Pt ambulates slowly  likely 2/2 pt's unsteadiness  Stairs            Wheelchair Mobility    Modified Rankin (Stroke Patients Only)       Balance Overall balance assessment: Needs assistance Sitting-balance support: Feet supported Sitting balance-Leahy Scale: Good     Standing balance support: Single extremity supported Standing balance-Leahy Scale: Poor                               Pertinent Vitals/Pain Pain Assessment: 0-10 Pain Score: 9  Pain Location: rt shoulder Pain Descriptors / Indicators: Aching;Constant Pain Intervention(s): Limited activity within patient's tolerance;Monitored during session;Repositioned;Ice applied    Home Living Family/patient expects to be discharged to:: Private residence Living Arrangements: Alone Available Help at Discharge: Family;Available 24 hours/day Type of Home: House Home Access: Stairs to enter Entrance Stairs-Rails: Left;Right;Can reach both Technical brewer of Steps: 9 Home Layout: One level Home Equipment: Shower seat      Prior Function Level of Independence: Independent         Comments: Pt reports h/o falls  (Pt receive OP PT for balance w/o improvement per pt.)     Hand Dominance   Dominant Hand: Right    Extremity/Trunk Assessment   Upper Extremity Assessment: Defer to OT evaluation           Lower Extremity Assessment: Overall WFL for tasks assessed      Cervical / Trunk Assessment: Normal  Communication   Communication: HOH  Cognition Arousal/Alertness: Awake/alert Behavior During Therapy: WFL for tasks assessed/performed Overall Cognitive  Status: Within Functional Limits for tasks assessed                      General Comments      Exercises        Assessment/Plan    PT Assessment Patient needs continued PT services  PT Diagnosis Difficulty walking;Acute pain   PT Problem List Decreased balance;Decreased mobility;Decreased activity tolerance;Decreased range of  motion;Decreased coordination;Decreased knowledge of use of DME;Decreased safety awareness;Pain  PT Treatment Interventions DME instruction;Gait training;Stair training;Functional mobility training;Therapeutic activities;Therapeutic exercise;Balance training;Patient/family education;Modalities   PT Goals (Current goals can be found in the Care Plan section) Acute Rehab PT Goals Patient Stated Goal: To be able to navigate around her home w/o losing her balance PT Goal Formulation: With patient Time For Goal Achievement: 12/21/14 Potential to Achieve Goals: Good    Frequency Min 3X/week   Barriers to discharge        Co-evaluation               End of Session Equipment Utilized During Treatment: Gait belt Activity Tolerance: Patient tolerated treatment well;Patient limited by pain;Patient limited by fatigue Patient left: in bed;with call bell/phone within reach (eating dinner)           Time: 1624-4695 PT Time Calculation (min) (ACUTE ONLY): 24 min   Charges:   PT Evaluation $Initial PT Evaluation Tier I: 1 Procedure PT Treatments $Gait Training: 8-22 mins   PT G CodesJoslyn Hy PT, DPT [phone] #[id number] 12/14/2014, 5:09 PM

## 2014-12-14 NOTE — Progress Notes (Signed)
   PATIENT ID: Elizabeth Stone   1 Day Post-Op Procedure(s) (LRB): REVERSE SHOULDER ARTHROPLASTY (Right)  Subjective: Reports pain in right shoulder this am. Waiting for next Dilaudid dose. On baseline Norco chronically at home. Reports block wore off last night with increased pain following. No other problems or concerns.  Objective:  Filed Vitals:   12/14/14 0556  BP: 94/42  Pulse: 76  Temp: 98.1 F (36.7 C)  Resp: 13     R UE dressing c/d/i Mild swelling right hand Wiggles fingers, distally NVI Fires deltoid  Labs:   Recent Labs  12/14/14 0544  HGB 11.8*   Recent Labs  12/14/14 0544  WBC 7.4  RBC 3.54*  HCT 34.6*  PLT 162  No results for input(s): NA, K, CL, CO2, BUN, CREATININE, GLUCOSE, CALCIUM in the last 72 hours.  Assessment and Plan: 1 day s/p right reverse TSA Sling OT for hand wrist elbow ROM only Start oral dilaudid 2-4 mg q 3 hrs, will prescribe this as home pain rx, okay for IV for breakthough but minimize as much as possible May need another night to get pain until control, we'll check in this afternoon FU with Dr. Tamera Punt in 2 weeks  VTE proph: ASA 325mg  BID, SCDs

## 2014-12-14 NOTE — Progress Notes (Signed)
Utilization review completed. Bonny Vanleeuwen, RN, BSN. 

## 2014-12-14 NOTE — Progress Notes (Signed)
Occupational Therapy Progress Note  Daughter in law present and instructed how to assist pt with sling management, UB bathing and dressing, precautions and exercises.  She was able to safely assist pt with all.    No DME recommended and follow up therapy at MD discretion.    12/14/14 1700  OT Visit Information  Last OT Received On 12/14/14  Assistance Needed +1  History of Present Illness This 69 y.o. female admitted for Rt reverse TSA.  PMH includes:  Arthritis; depression; Menier's disease; osteoporosis; fibromyalgia; cochlear implant due to hearing impairment   OT Time Calculation  OT Start Time (ACUTE ONLY) 1559  OT Stop Time (ACUTE ONLY) 1626  OT Time Calculation (min) 27 min  Precautions  Precautions Fall  Precaution Comments Pt reports ~ 7 falls in the past year.  She has a h/o Meniere's disease and reports she has bees seen by OPPT in the past   Pain Assessment  Pain Assessment 0-10  Pain Score 8  Pain Location rt shoulder   Pain Descriptors / Indicators Aching;Constant  Pain Intervention(s) Monitored during session;Repositioned  Cognition  Arousal/Alertness Awake/alert  Behavior During Therapy WFL for tasks assessed/performed  Overall Cognitive Status Within Functional Limits for tasks assessed  ADL  General ADL Comments daughter in law present and instructed how to assist pt with donning/doffing sling, UB bathing and dressing as well as precautions   Bed Mobility  Overal bed mobility Needs Assistance  Bed Mobility Supine to Sit  Supine to sit Supervision;HOB elevated  Balance  Overall balance assessment Needs assistance  Sitting-balance support Feet supported  Sitting balance-Leahy Scale Good  Standing balance support During functional activity  Standing balance-Leahy Scale Fair  Restrictions  Weight Bearing Restrictions Yes  RUE Weight Bearing NWB  Transfers  Overall transfer level Needs assistance  Transfers Sit to/from Stand;Stand Pivot Transfers  Sit to  Stand Min guard  Stand pivot transfers Min guard  Shoulder Instructions  Donning/doffing shirt without moving shoulder Supervision/safety  Method for sponge bathing under operated UE Supervision/safety  Donning/doffing sling/immobilizer Supervision/safety  Correct positioning of sling/immobilizer Supervision/safety  ROM for elbow, wrist and digits of operated UE Supervision/safety  Sling wearing schedule (on at all times/off for ADL's) Supervision/safety  Proper positioning of operated UE when showering Supervision/safety  Positioning of UE while sleeping Supervision/safety  Shoulder Exercises  Elbow Flexion AROM;10 reps;Right;Standing  Elbow Extension AROM;Right;10 reps;Standing  Wrist Flexion AROM;Right;10 reps;Standing  Wrist Extension AROM;Right;10 reps;Seated  Digit Composite Flexion AROM;Right;10 reps;Standing  Composite Extension AROM;Right;10 reps;Standing  OT - End of Session  Equipment Utilized During Treatment Other (comment) (sling )  Activity Tolerance Patient tolerated treatment well  Patient left Other (comment) (with PT)  Nurse Communication Mobility status  OT Assessment/Plan  OT Plan Discharge plan remains appropriate  OT Frequency (ACUTE ONLY) Min 2X/week  Follow Up Recommendations No OT follow up;Supervision/Assistance - 24 hour  OT Equipment None recommended by OT  OT Goal Progression  Progress towards OT goals Progressing toward goals  ADL Goals  Pt Will Transfer to Toilet with supervision;ambulating  Pt Will Perform Toileting - Clothing Manipulation and hygiene with supervision;sit to/from stand  Pt Will Perform Tub/Shower Transfer Tub transfer;with min guard assist;ambulating;shower seat  Additional ADL Goal #1 Pt/caregiver will be independent with precautions, BADLs, and sling management   OT General Charges  $OT Visit 1 Procedure  OT Treatments  $Self Care/Home Management  23-37 mins   [employer], OTR/L [phone]

## 2014-12-15 NOTE — Progress Notes (Signed)
   PATIENT ID: Elizabeth Stone   2 Days Post-Op Procedure(s) (LRB): REVERSE SHOULDER ARTHROPLASTY (Right)  Subjective: Reports pain in right shoulder this am. Waiting for next Dilaudid dose. On baseline Norco chronically at home. Reports block wore off last night with increased pain following. No other problems or concerns.  Objective:  Filed Vitals:   12/15/14 0449  BP: 109/44  Pulse: 94  Temp: 98.4 F (36.9 C)  Resp: 16     R UE dressing c/d/i Mild swelling right hand Wiggles fingers, distally NVI Fires deltoid  Labs:   Recent Labs  12/14/14 0544  HGB 11.8*    Recent Labs  12/14/14 0544  WBC 7.4  RBC 3.54*  HCT 34.6*  PLT 162  No results for input(s): NA, K, CL, CO2, BUN, CREATININE, GLUCOSE, CALCIUM in the last 72 hours.  Assessment and Plan: 2 day s/p right reverse TSA Pain control better, patient had some in-house therapy yesterday to reinforce postop regimen Sling OT for hand wrist elbow ROM only oral dilaudid 2-4 mg q 3 hrs working better--will use this for home as well as needed D/c today FU with Dr. Tamera Punt in 2 weeks  VTE proph: ASA 325mg  BID, SCDs

## 2014-12-15 NOTE — Progress Notes (Addendum)
Occupational Therapy Treatment Patient Details Name: Elizabeth Stone MRN: [id number] DOB: 09-20-1946 Today's Date: 12/15/2014    History of present illness This 69 y.o. female admitted for Rt reverse TSA.  PMH includes:  Arthritis; depression; Menier's disease; osteoporosis; fibromyalgia; cochlear implant due to hearing impairment    OT comments  Pt progressing. Practiced simulated tub transfer.    Follow Up Recommendations  No OT follow up;Supervision/Assistance - 24 hour    Equipment Recommendations  None recommended by OT    Recommendations for Other Services      Precautions / Restrictions Precautions Precautions: Fall Precaution Comments: Pt reports ~ 7 falls in the past year.  She has a h/o Meniere's disease and reports she has bees seen by OPPT in the past  Restrictions Weight Bearing Restrictions: Yes RUE Weight Bearing: Non weight bearing       Mobility Bed Mobility Overal bed mobility: Needs Assistance Bed Mobility: Supine to Sit;Sit to Sidelying     Supine to sit: Modified independent (Device/Increase time)   Sit to sidelying: Supervision General bed mobility comments: Pt getting in sidelying position showing therapist positioning of Rt UE.   Transfers Overall transfer level: Needs assistance Equipment used: None Transfers: Sit to/from Stand Sit to Stand: Supervision              Balance  No LOB in session. Min guard for ambulation and tub transfer.                                 ADL Overall ADL's : Needs assistance/impaired                     Lower Body Dressing: Sitting/lateral leans;Supervision/safety (adjusted sock-cues not to put weight on Rt UE)   Toilet Transfer: Min guard;Ambulation (bed)       Tub/ Shower Transfer: Min guard;Ambulation;Tub transfer   Functional mobility during ADLs: Supervision/safety General ADL Comments: Pt put in new battery in hearing aide during session. Reviewed shoulder precautions and  ADL information. Practiced tub transfer and recommended someone be with her for this. Educated on safety such as having family pick up rugs/items on floor.  Discussed positioning of her shower chair at home to make it work in her tub. Educated on shoulder precautions. Educated on importance of Rt UE exercises. Discussed recommending someone be with her at home.      Vision                     Perception     Praxis      Cognition  Awake/Alert Behavior During Therapy: WFL for tasks assessed/performed Overall Cognitive Status: Within Functional Limits for tasks assessed                       Extremity/Trunk Assessment               Exercises Other Exercises Other Exercises: Performed approximately 10 reps each of Rt AROM elbow flexion/extension, wrist flexion/extension and digit composite flexion/extension as well as supination/pronation; OT supported Rt UE as pt stood to do elbow flexion/extension to try to prevent shoulder motion. Donning/doffing shirt without moving shoulder:  (educated on technique) Method for sponge bathing under operated UE: Patient able to independently direct caregiver (reviewed technique) Donning/doffing sling/immobilizer: Moderate assistance Correct positioning of sling/immobilizer: Moderate assistance ROM for elbow, wrist and digits of operated UE: Min-guard Sling wearing schedule (  on at all times/off for ADL's): Supervision/safety (educated)   Shoulder Instructions Shoulder Instructions Donning/doffing shirt without moving shoulder:  (educated on technique) Method for sponge bathing under operated UE: Patient able to independently direct caregiver (reviewed technique) Donning/doffing sling/immobilizer: Moderate assistance Correct positioning of sling/immobilizer: Maximal assistance ROM for elbow, wrist and digits of operated UE: Min-guard Sling wearing schedule (on at all times/off for ADL's): Supervision/safety (educated)      General Comments      Pertinent Vitals/ Pain       Pain Assessment: 0-10 Pain Score: 5  Pain Location: Rt UE Pain Descriptors / Indicators: Throbbing Pain Intervention(s): Monitored during session  Home Living                                          Prior Functioning/Environment              Frequency Min 2X/week     Progress Toward Goals  OT Goals(current goals can now be found in the care plan section)  Progress towards OT goals: Progressing toward goals  Acute Rehab OT Goals Patient Stated Goal: not stated OT Goal Formulation: With patient Time For Goal Achievement: 12/21/14 Potential to Achieve Goals: Good ADL Goals Pt Will Transfer to Toilet: with supervision;ambulating Pt Will Perform Toileting - Clothing Manipulation and hygiene: with supervision;sit to/from stand Pt Will Perform Tub/Shower Transfer: Tub transfer;with min guard assist;ambulating;shower seat Additional ADL Goal #1: Pt/caregiver will be independent with precautions, BADLs, and sling management   Plan Discharge plan remains appropriate    Co-evaluation                 End of Session Equipment Utilized During Treatment: Gait belt;Other (comment) (sling)   Activity Tolerance Patient tolerated treatment well   Patient Left in chair;with call bell/phone within reach   Nurse Communication          Time: 0951-1010 OT Time Calculation (min): 19 min  Charges: OT General Charges $OT Visit: 1 Procedure OT Treatments $Self Care/Home Management : 8-22 mins  Benito Mccreedy OTR/L [phone] 12/15/2014, 10:22 AM

## 2014-12-15 NOTE — Progress Notes (Signed)
Physical Therapy Treatment Patient Details Name: EMYLEE DECELLE MRN: [id number] DOB: 01-10-1946 Today's Date: December 25, 2014    History of Present Illness This 69 y.o. female admitted for Rt reverse TSA.  PMH includes:  Arthritis; depression; Menier's disease; osteoporosis; fibromyalgia; cochlear implant due to hearing impairment     PT Comments    Pt progressing with mobility, but still somewhat unsteady.  Pt feels safe for DC and reports she will have someone staying with her at DC.  Follow Up Recommendations  Home health PT     Equipment Recommendations  Cane    Recommendations for Other Services       Precautions / Restrictions Precautions Precautions: Fall Precaution Comments: Pt reports ~ 7 falls in the past year.  She has a h/o Meniere's disease and reports she has bees seen by OPPT in the past  Restrictions Weight Bearing Restrictions: Yes RUE Weight Bearing: Non weight bearing    Mobility  Bed Mobility Overal bed mobility:  (NT, pt up in chair)           Transfers Overall transfer level: Needs assistance Equipment used: None Transfers: Sit to/from Stand Sit to Stand: Supervision            Ambulation/Gait Ambulation/Gait assistance: Min guard Ambulation Distance (Feet): 100 Feet Assistive device: Straight cane Gait Pattern/deviations: Step-to pattern;Decreased stride length   Gait velocity interpretation: Below normal speed for age/gender General Gait Details: No balance losses but somewhat unsteady   Stairs Stairs: Yes Stairs assistance: Min guard Stair Management: One rail Left Number of Stairs: 3 (performed 4 times) General stair comments: no difficultiesor balance loss  Wheelchair Mobility    Modified Rankin (Stroke Patients Only)       Balance                                    Cognition Arousal/Alertness: Awake/alert Behavior During Therapy: WFL for tasks assessed/performed Overall Cognitive Status: Within  Functional Limits for tasks assessed                         General Comments General comments (skin integrity, edema, etc.): No family present. Pt would like cane for home      Pertinent Vitals/Pain Pain Assessment: 0-10 Pain Score: 5  Pain Location: RUE Pain Descriptors / Indicators: Throbbing Pain Intervention(s): Limited activity within patient's tolerance;Monitored during session;Premedicated before session;Repositioned    Home Living                      Prior Function            PT Goals (current goals can now be found in the care plan section) Acute Rehab PT Goals Patient Stated Goal: not stated    Frequency       PT Plan Current plan remains appropriate    Co-evaluation             End of Session Equipment Utilized During Treatment: Gait belt (RUE sling) Activity Tolerance: Patient tolerated treatment well Patient left: in chair;with call bell/phone within reach     Time: 1856-3149 PT Time Calculation (min) (ACUTE ONLY): 24 min  Charges:  $Gait Training: 23-37 mins                    G Codes:      Melvern Banker December 25, 2014, 1:33 PM Lavonia Dana, PT  [phone] 12/15/2014   

## 2014-12-15 NOTE — Progress Notes (Addendum)
Occupational Therapy Treatment Patient Details Name: Elizabeth Stone MRN: [id number] DOB: August 02, 1946 Today's Date: 12/15/2014    History of present illness This 69 y.o. female admitted for Rt reverse TSA.  PMH includes:  Arthritis; depression; Menier's disease; osteoporosis; fibromyalgia; cochlear implant due to hearing impairment    OT comments  Pt requested OT to come back and review with family. Education provided in session. Pt planning to d/c today.  Follow Up Recommendations  No OT follow up;Supervision/Assistance - 24 hour    Equipment Recommendations  None recommended by OT    Recommendations for Other Services      Precautions / Restrictions Precautions Precautions: Fall Precaution Comments: Pt reports ~ 7 falls in the past year.  She has a h/o Meniere's disease and reports she has bees seen by OPPT in the past  Restrictions Weight Bearing Restrictions: Yes RUE Weight Bearing: Non weight bearing       Mobility Bed Mobility Overal bed mobility: Modified Independent Bed Mobility: Supine to Sit     Supine to sit: Modified independent (Device/Increase time)    Transfers Overall transfer level: Needs assistance Equipment used: None Transfers: Sit to/from Stand Sit to Stand: Supervision              Balance    No LOB in session.                               ADL Overall ADL's : Needs assistance/impaired                     Lower Body Dressing: Sit to/from stand (caregiver independent in assisting pt)   Toilet Transfer: Supervision/safety (sit <> stand to/from bed)       Tub/ Shower Transfer: Min guard;Ambulation;Tub transfer   Functional mobility during ADLs: Supervision/safety General ADL Comments: Reviewed ADL information and shoulder precautions with pt/family. Daughter-in-law practiced managing sling and helped pt with LB clothing.  Educated on safety such as items on floor/rugs, sitting for LB ADLs and having someone with her  for tub transfer. Discussed positioning of shower chair as pt unsure if it will fit. Recommended pt not getting up alone when family goes to store when they get home.      Vision                     Perception     Praxis      Cognition  Awake/Alert Behavior During Therapy: WFL for tasks assessed/performed Overall Cognitive Status: Within Functional Limits for tasks assessed                       Extremity/Trunk Assessment               Exercises Other Exercises Other Exercises: Performed approximately 10 reps of Rt AROM elbow flexion/extension standing Donning/doffing shirt without moving shoulder: Caregiver independent with task Method for sponge bathing under operated UE:  (educated) Donning/doffing sling/immobilizer: Supervision/safety (caregiver practiced) Correct positioning of sling/immobilizer: Supervision/safety (caregiver practiced) ROM for elbow, wrist and digits of operated UE: Supervision/safety (performed elbow ROM standing) Sling wearing schedule (on at all times/off for ADL's): Supervision/safety (educated)   Shoulder Instructions Shoulder Instructions Donning/doffing shirt without moving shoulder: Caregiver independent with task Method for sponge bathing under operated UE:  (educated) Donning/doffing sling/immobilizer: Supervision/safety (caregiver practiced) Correct positioning of sling/immobilizer: Supervision/safety (caregiver practiced) ROM for elbow, wrist and digits of operated UE:  Supervision/safety (performed elbow ROM standing) Sling wearing schedule (on at all times/off for ADL's): Supervision/safety (educated)     General Comments      Pertinent Vitals/ Pain       Pain Assessment: 0-10 Pain Score: 4  Pain Location: RUE Pain Intervention(s): Monitored during session  Home Living                                          Prior Functioning/Environment              Frequency Min 2X/week      Progress Toward Goals  OT Goals(current goals can now be found in the care plan section)  Progress towards OT goals: Progressing toward goals  Acute Rehab OT Goals Patient Stated Goal: not stated OT Goal Formulation: With patient Time For Goal Achievement: 12/21/14 Potential to Achieve Goals: Good ADL Goals Pt Will Transfer to Toilet: with supervision;ambulating Pt Will Perform Toileting - Clothing Manipulation and hygiene: with supervision;sit to/from stand Pt Will Perform Tub/Shower Transfer: Tub transfer;with min guard assist;ambulating;shower seat Additional ADL Goal #1: Pt/caregiver will be independent with precautions, BADLs, and sling management   Plan Discharge plan remains appropriate    Co-evaluation                 End of Session Equipment Utilized During Treatment: Other (comment) (sling)   Activity Tolerance Patient tolerated treatment well   Patient Left in bed;with family/visitor present   Nurse Communication          Time: 2574-9355 OT Time Calculation (min): 16 min  Charges: OT General Charges $OT Visit: 1 Procedure OT Treatments $Self Care/Home Management : 8-22 mins  Benito Mccreedy OTR/L [phone] 12/15/2014, 1:57 PM

## 2014-12-15 NOTE — Progress Notes (Signed)
CARE MANAGEMENT NOTE 12/15/2014  Patient:  Elizabeth Stone, Elizabeth Stone   Account Number:  [id number]  Date Initiated:  12/15/2014  Documentation initiated by:  Shaeleigh Graw  Subjective/Objective Assessment:   Dx. Rotator cuff tear arthropathy  Procedure: REVERSE SHOULDER ARTHROPLASTY     Action/Plan:   D/c planning   Anticipated DC Date:  12/15/2014   Anticipated DC Plan:  [place]  In-house referral  NA      DC Planning Services  CM consult      [facility] Choice  [place]   Choice offered to / List presented to:  C-1 Patient   DME arranged  [facility]      DME agency  [place] arranged  [place].   Status of service:  Completed, signed off Medicare Important Message given?   (If response is "NO", the following Medicare IM given date fields will be blank) Date Medicare IM given:   Medicare IM given by:   Date Additional Medicare IM given:   Additional Medicare IM given by:    Discharge Disposition:    Per UR Regulation:    If discussed at Long Length of Stay Meetings, dates discussed:    Comments:  12/15/14  10:44am  Verbal CM referral received via unit nurse.  Spoke with pt. for ? d/c today.  States he felt like she did better  with PT this am and will have assistance post discharge as needed.   Confirmed MD has been notified to complete face to face, [facility] agency choice, DME order, HH order, and demographic information accurate. [place] referral called to [place] [facility] [phone]).   DME referral called to Jeneen Rinks [phone]) @ [facility] and confirmed DME should be covered  and will be billed to pt's insurance.   No other CM needs expressed at this time.  AVS updated as needed.  Advised charge nurse Kenney Houseman) that CM referral completed.   Nurse states she is waiting for MD to confirmation  HHPT is still  needed.   Briggett Tuccillo H. Burt Knack Therapist, sports, BSN, [place]  [phone])

## 2014-12-17 ENCOUNTER — Encounter (HOSPITAL_COMMUNITY): Payer: Self-pay | Admitting: Orthopedic Surgery

## 2014-12-24 NOTE — Discharge Summary (Signed)
Patient ID: Elizabeth Stone MRN: [id number] DOB/AGE: 1945/12/28 69 y.o.  Admit date: 12/13/2014 Discharge date: 12/15/2014  Admission Diagnoses:  Active Problems:   Rotator cuff tear arthropathy   Discharge Diagnoses:  Same  Past Medical History  Diagnosis Date  . Arthritis   . Anxiety   . Depression   . GERD (gastroesophageal reflux disease)   . Meniere disease     takes HCTZ for her inner ear problem  . Osteoporosis   . Hearing impaired     has cochlear implant  . Mixed hyperlipidemia 09/27/2013  . PONV (postoperative nausea and vomiting)   . Fibromyalgia     Surgeries: Procedure(s): REVERSE SHOULDER ARTHROPLASTY on 12/13/2014   Consultants:    Discharged Condition: Improved  Hospital Course: Elizabeth Stone is an 69 y.o. female who was admitted 12/13/2014 for operative treatment of right shoulder rotator cuff tear arthropathy.  Patient has severe unremitting pain that affects sleep, daily activities, and work/hobbies. After pre-op clearance the patient was taken to the operating room on 12/13/2014 and underwent  Procedure(s): [place].    Patient was given perioperative antibiotics:  Anti-infectives    Start     Dose/Rate Route Frequency Ordered Stop   12/13/14 1400  ceFAZolin (ANCEF) IVPB 1 g/50 mL premix     1 g 100 mL/hr over 30 Minutes Intravenous Every 6 hours 12/13/14 1239 12/14/14 0311   12/13/14 0600  ceFAZolin (ANCEF) IVPB 2 g/50 mL premix     2 g 100 mL/hr over 30 Minutes Intravenous On call to O.R. 12/12/14 1404 12/13/14 0751       Patient was given sequential compression devices, early ambulation, and ASA 325mg  BID to prevent DVT.  Patient benefited maximally from hospital stay and there were no complications.    Recent vital signs: No data found.    Recent laboratory studies: No results for input(s): WBC, HGB, HCT, PLT, NA, K, CL, CO2, BUN, CREATININE, GLUCOSE, INR, CALCIUM in the last 72 hours.  Invalid input(s): PT,  2   Discharge Medications:     Medication List    STOP taking these medications        HYDROcodone-acetaminophen 5-325 MG per tablet  Commonly known as:  NORCO/VICODIN      TAKE these medications        aspirin 81 MG tablet  Take 81 mg by mouth daily.     atorvastatin 10 MG tablet  Commonly known as:  LIPITOR  Take 10 mg by mouth daily.     buPROPion 300 MG 24 hr tablet  Commonly known as:  WELLBUTRIN XL  Take 300 mg by mouth Daily.     CALCIUM PO  Take 1 tablet by mouth 2 (two) times daily.     CELEBREX 200 MG capsule  Generic drug:  celecoxib  Take 200 mg by mouth Daily.     chlorthalidone 50 MG tablet  Commonly known as:  HYGROTON  Take 50 mg by mouth Daily.     clonazePAM 0.5 MG tablet  Commonly known as:  KLONOPIN  Take 0.25 mg by mouth at bedtime. Can take up to 2 tabs at bedtime     CYMBALTA 60 MG capsule  Generic drug:  DULoxetine  Take 60 mg by mouth Daily.     docusate sodium 100 MG capsule  Commonly known as:  COLACE  Take 1 capsule (100 mg total) by mouth 3 (three) times daily as needed.     Fish Oil 1200 MG Caps  Take 1,200 mg by mouth daily.     hydrochlorothiazide 25 MG tablet  Commonly known as:  HYDRODIURIL  Take 25 mg by mouth Daily.     HYDROmorphone 2 MG tablet  Commonly known as:  DILAUDID  Take 1-2 tablets (2-4 mg total) by mouth every 4 (four) hours as needed for severe pain.     KLOR-CON M20 20 MEQ tablet  Generic drug:  potassium chloride SA  Take 20 mEq by mouth 2 (two) times daily.     lamoTRIgine 200 MG tablet  Commonly known as:  LAMICTAL  Take 200 mg by mouth daily.     montelukast 10 MG tablet  Commonly known as:  SINGULAIR  Take 10 mg by mouth at bedtime.     NEXIUM 40 MG capsule  Generic drug:  esomeprazole  Take 40 mg by mouth every other day.     SAPHRIS 10 MG Subl  Generic drug:  Asenapine Maleate  Take 10 mg by mouth At bedtime.     vitamin C 500 MG tablet  Commonly known as:  ASCORBIC ACID  Take  500 mg by mouth 2 (two) times daily.        Diagnostic Studies: Ct Shoulder Right Wo Contrast  11/26/2014   CLINICAL DATA:  Preop right shoulder replacement.  EXAM: CT OF THE RIGHT SHOULDER WITHOUT CONTRAST  TECHNIQUE: Multidetector CT imaging was performed according to the standard protocol. Multiplanar CT image reconstructions were also generated.  COMPARISON:  04/11/2009  FINDINGS: No acute fracture or dislocation. Severe osteoarthritis of the glenohumeral joint with joint space narrowing and marginal osteophytosis. There is complete loss of the normal acromiohumeral distance consistent with a chronic rotator cuff tear. There is no lytic or sclerotic osseous lesion. There is no significant joint effusion. There is no significant subacromial/subdeltoid bursal fluid. There is no fluid collection or hematoma. The visualized right lung is clear. There is no axillary lymphadenopathy. The muscles appear normal.  IMPRESSION: 1. Severe osteoarthritis of the right glenohumeral joint.   Electronically Signed   By: Kathreen Devoid   On: 11/26/2014 15:26   Dg Shoulder Right Port  12/13/2014   CLINICAL DATA:  Postoperative evaluation right shoulder replacement  EXAM: PORTABLE RIGHT SHOULDER - 2+ VIEW  COMPARISON:  11/26/2014  FINDINGS: Components of right shoulder prosthesis appear in anticipated position. There is anticipated soft tissue postoperative change over the glenohumeral joint. Mild right mid to lower lung zone atelectasis.  IMPRESSION: Postoperative appearance of the right shoulder   Electronically Signed   By: Skipper Cliche M.D.   On: 12/13/2014 11:43    Disposition: 01-Home or Self Care        Follow-up Information    Follow up with Nita Sells, MD. Schedule an appointment as soon as possible for a visit in 2 weeks.   Specialty:  Orthopedic Surgery   Contact information:   [address]  [place] [address] [phone]       Follow up with [facility].   Why:  Kasandra Knudsen provided prior to discharge.    Contact information:   [address] [place] [address] [phone]       Follow up with [facility].   Why:  Home Health Physical Therapy has been arranged.   Contact information:   [address] [place] [address] [phone]        Signed: Grier Mitts 12/24/2014, 12:57 PM

## 2015-02-18 ENCOUNTER — Other Ambulatory Visit: Payer: Self-pay | Admitting: Orthopedic Surgery

## 2015-02-18 DIAGNOSIS — M25511 Pain in right shoulder: Secondary | ICD-10-CM

## 2015-02-19 ENCOUNTER — Ambulatory Visit
Admission: RE | Admit: 2015-02-19 | Discharge: 2015-02-19 | Disposition: A | Discharge status: Home / Self Care | Payer: Medicare Other | Source: Ambulatory Visit | Attending: Orthopedic Surgery | Admitting: Orthopedic Surgery

## 2015-02-19 DIAGNOSIS — M25511 Pain in right shoulder: Secondary | ICD-10-CM

## 2015-09-03 ENCOUNTER — Encounter: Payer: Self-pay | Admitting: Gastroenterology

## 2015-11-18 NOTE — Progress Notes (Signed)
Cardiology Office Note:    Date:  11/19/2015   ID:  Elizabeth Stone, DOB May 22, 1946, MRN [id number]  PCP:  Abigail Miyamoto, MD  Cardiologist:  Dr. Candee Furbish   Electrophysiologist:  n/a  Chief Complaint  Patient presents with  . Surgical Clearance    History of Present Illness:    Elizabeth Stone is a 70 y.o. hearing impaired female with a hx of HTN, HL, family history of heart disease. ETT in 12/12 is low risk. Last seen by Dr. Marlou Porch 1/16.  She complained of dyspnea. Exercise treadmill test was negative for ischemic ST changes.    She needs a R total knee replacement next month with Dr. Noemi Chapel.  She needs surgical clearance.  She has been doing well. The patient denies chest pain, shortness of breath, syncope, orthopnea, PND or significant pedal edema.  She can climb steps, vacuum a room or carry an armload of groceries without difficulty.   Past Medical History  Diagnosis Date  . Arthritis   . Anxiety   . Depression   . GERD (gastroesophageal reflux disease)   . Meniere disease     takes HCTZ for her inner ear problem  . Osteoporosis   . Hearing impaired     has cochlear implant  . Mixed hyperlipidemia 09/27/2013  . Fibromyalgia   . HTN (hypertension)     Past Surgical History  Procedure Laterality Date  . Abdominal hysterectomy    . Bladder tack    . Cochlear implant      2007  . Hammer toe surgery      left foot  . Reverse shoulder arthroplasty Right 12/13/2014    dr Tamera Punt  . Reverse shoulder arthroplasty Right 12/13/2014    Procedure: REVERSE SHOULDER ARTHROPLASTY;  Surgeon: Nita Sells, MD;  Location: [facility];  Service: Orthopedics;  Laterality: Right;  Right reverse total shoulder replacement    Current Medications: Outpatient Prescriptions Prior to Visit  Medication Sig Dispense Refill  . aspirin 81 MG tablet Take 81 mg by mouth daily.    Marland Kitchen atorvastatin (LIPITOR) 10 MG tablet Take 10 mg by mouth daily.    Marland Kitchen buPROPion (WELLBUTRIN XL) 300 MG  24 hr tablet Take 300 mg by mouth Daily.     Marland Kitchen CALCIUM PO Take 1 tablet by mouth 2 (two) times daily.    . CELEBREX 200 MG capsule Take 200 mg by mouth Daily.     . clonazePAM (KLONOPIN) 0.5 MG tablet Take 0.25 mg by mouth at bedtime. Can take up to 2 tabs at bedtime    . CYMBALTA 60 MG capsule Take 60 mg by mouth Daily.     . hydrochlorothiazide (HYDRODIURIL) 25 MG tablet Take 25 mg by mouth Daily.     Marland Kitchen KLOR-CON M20 20 MEQ tablet Take 20 mEq by mouth 2 (two) times daily.     Marland Kitchen lamoTRIgine (LAMICTAL) 200 MG tablet Take 200 mg by mouth daily.     . montelukast (SINGULAIR) 10 MG tablet Take 10 mg by mouth at bedtime.     Marland Kitchen NEXIUM 40 MG capsule Take 40 mg by mouth every other day.     . Omega-3 Fatty Acids (FISH OIL) 1200 MG CAPS Take 1,200 mg by mouth daily.     Marland Kitchen SAPHRIS 10 MG SUBL Take 10 mg by mouth At bedtime.     . vitamin C (ASCORBIC ACID) 500 MG tablet Take 500 mg by mouth 2 (two) times daily.    Marland Kitchen  chlorthalidone (HYGROTON) 50 MG tablet Take 50 mg by mouth Daily. Reported on 11/19/2015    . docusate sodium (COLACE) 100 MG capsule Take 1 capsule (100 mg total) by mouth 3 (three) times daily as needed. (Patient not taking: Reported on 11/19/2015) 20 capsule 0  . HYDROmorphone (DILAUDID) 2 MG tablet Take 1-2 tablets (2-4 mg total) by mouth every 4 (four) hours as needed for severe pain. (Patient not taking: Reported on 11/19/2015) 50 tablet 0   No facility-administered medications prior to visit.     Allergies:   Review of patient's allergies indicates no known allergies.   Social History   Social History  . Marital Status: Widowed    Spouse Name: N/A  . Number of Children: N/A  . Years of Education: N/A   Social History Main Topics  . Smoking status: Former Smoker    Quit date: 08/25/1993  . Smokeless tobacco: Never Used  . Alcohol Use: 3.6 oz/week    6 Glasses of wine per week  . Drug Use: No  . Sexual Activity: Not Asked   Other Topics Concern  . None   Social History  Narrative     Family History:  The patient's family history includes Breast cancer in her mother; Cervical cancer in her mother; Diabetes in her maternal aunt and mother; Heart disease in her father and mother. There is no history of Colon cancer, Esophageal cancer, Rectal cancer, or Stomach cancer.   ROS:   Please see the history of present illness.    ROS All other systems reviewed and are negative.   Physical Exam:    VS:  BP 122/60 mmHg  Pulse 84  Ht 5' 3.5" (1.613 m)  Wt 144 lb 6.4 oz (65.499 kg)  BMI 25.17 kg/m2   GEN: Well nourished, well developed, in no acute distress HEENT: normal Neck: no JVD, no masses Cardiac: Normal S1/S2, RRR; no murmurs, rubs, or gallops, no edema;  no carotid bruits,   Respiratory:  clear to auscultation bilaterally; no wheezing, rhonchi or rales GI: soft, nontender, nondistended, + BS MS: no deformity or atrophy Skin: warm and dry, no rash Neuro:   no focal deficits  Psych: Alert and oriented x 3, normal affect  Wt Readings from Last 3 Encounters:  11/19/15 144 lb 6.4 oz (65.499 kg)  12/13/14 149 lb (67.586 kg)  11/15/14 154 lb (69.854 kg)      Studies/Labs Reviewed:    EKG:  EKG is  ordered today.  The ekg ordered today demonstrates NSR, HR 85, unusual p axis, QTc 440 ms, no change from prior tracing dated 11/15/14  Recent Labs: 12/04/2014: ALT 93*; BUN 11; Creatinine, Ser 0.66; Potassium 3.0*; Sodium 135 12/14/2014: Hemoglobin 11.8*; Platelets 162   Recent Lipid Panel No results found for: CHOL, TRIG, HDL, CHOLHDL, VLDL, LDLCALC, LDLDIRECT  Additional studies/ records that were reviewed today include:   ETT 2/16 ETT Interpretation: normal - no evidence of ischemia by ST analysis   ASSESSMENT:    1. Pre-operative cardiovascular examination   2. Essential hypertension, benign   3. Mixed hyperlipidemia     PLAN:    In order of problems listed above:  1. Surgical Clearance - The patient does not have any unstable cardiac  conditions. She had a low risk exercise treadmill test in 11/2014.  She has not had any change in her symptoms since that time. Her ECG today is unchanged without significant ST changes.  Upon evaluation today, she can achieve 4 METs or  greater without anginal symptoms.  According to [facility] and AHA guidelines, she requires no further cardiac workup prior to her noncardiac surgery and should be at acceptable risk.  Our service is available as necessary in the perioperative period. If needed, she can hold ASA 5-7 days prior to surgery and resume after surgery when felt to be safe.  2. HTN - Controlled.    3. HL - Continue statin. Managed by PCP.     Medication Adjustments/Labs and Tests Ordered: Current medicines are reviewed at length with the patient today.  Concerns regarding medicines are outlined above.  Medication changes, Labs and Tests ordered today are outlined in the Patient Instructions noted below. Patient Instructions  Medication Instructions:  Your physician recommends that you continue on your current medications as directed. Please refer to the Current Medication list given to you today.  If you need a refill on your cardiac medications before your next appointment, please call your pharmacy.  Labwork:  NONE ORDER TODAY  Testing/Procedures: NONE ORDER TODAY  Follow-Up:  Your physician wants you to follow-up in: [place] will receive a reminder letter in the mail two months in advance. If you don't receive a letter, please call our office to schedule the follow-up appointment.  Any Other Special Instructions Will Be Listed Below (If Applicable).   Signed, Richardson Dopp, PA-C  11/19/2015 12:26 PM    [facility] [address], [place], [place]  [address] Phone: [phone]; Fax: [phone]

## 2015-11-19 ENCOUNTER — Ambulatory Visit (INDEPENDENT_AMBULATORY_CARE_PROVIDER_SITE_OTHER): Payer: Medicare Other | Admitting: Physician Assistant

## 2015-11-19 ENCOUNTER — Encounter: Payer: Self-pay | Admitting: Physician Assistant

## 2015-11-19 VITALS — BP 122/60 | HR 84 | Ht 63.5 in | Wt 144.4 lb

## 2015-11-19 DIAGNOSIS — Z01810 Encounter for preprocedural cardiovascular examination: Secondary | ICD-10-CM

## 2015-11-19 DIAGNOSIS — E782 Mixed hyperlipidemia: Secondary | ICD-10-CM | POA: Diagnosis not present

## 2015-11-19 DIAGNOSIS — I10 Essential (primary) hypertension: Secondary | ICD-10-CM | POA: Diagnosis not present

## 2015-11-19 NOTE — Patient Instructions (Addendum)
Medication Instructions:  Your physician recommends that you continue on your current medications as directed. Please refer to the Current Medication list given to you today.  If you need a refill on your cardiac medications before your next appointment, please call your pharmacy.  Labwork:  NONE ORDER TODAY  Testing/Procedures: NONE ORDER TODAY  Follow-Up:  Your physician wants you to follow-up in: [place] will receive a reminder letter in the mail two months in advance. If you don't receive a letter, please call our office to schedule the follow-up appointment.  Any Other Special Instructions Will Be Listed Below (If Applicable).

## 2015-11-20 ENCOUNTER — Ambulatory Visit: Payer: Medicare Other | Admitting: Physician Assistant

## 2015-11-21 ENCOUNTER — Telehealth: Payer: Self-pay | Admitting: Physician Assistant

## 2015-11-21 NOTE — Telephone Encounter (Signed)
New Message:  Pt called in inquiring about if there is a test she can take that will let her know if she has any blockages in her arties. Please f/u with her  Thanks

## 2015-11-22 NOTE — Telephone Encounter (Signed)
Spoke with pt who is concerned because her husband died after having his 9rd "stroke."  She is asking if there is any kind of testing insurance will pay for that she should have to make sure she isn't going to have one.  Advised her that after her evaluation with Richardson Dopp, PA no further testing to required or advised.  There is no mention of carotid bruits or any abnormal heart rhythm.  She had a normal GXT 1 yr ago.  She states understanding and all questions were answered before the phone call was ended.  She thanked me for calling her back and reviewing the information with her.

## 2015-11-22 NOTE — Telephone Encounter (Signed)
Follow Up ° °Pt called back  °

## 2015-11-22 NOTE — Telephone Encounter (Signed)
Pt called yesterday inquiring if there were any test that she could have to see if she has a blockage. I lmom for ptcb. See last ov 11/19/15 from [place]. PA pt requires no further cardiac workup prior to her noncardiac surgery.

## 2015-11-25 DIAGNOSIS — G894 Chronic pain syndrome: Secondary | ICD-10-CM | POA: Diagnosis not present

## 2015-11-25 DIAGNOSIS — Z01818 Encounter for other preprocedural examination: Secondary | ICD-10-CM | POA: Diagnosis not present

## 2015-11-25 DIAGNOSIS — M545 Low back pain: Secondary | ICD-10-CM | POA: Diagnosis not present

## 2015-11-26 DIAGNOSIS — H40013 Open angle with borderline findings, low risk, bilateral: Secondary | ICD-10-CM | POA: Diagnosis not present

## 2015-11-26 DIAGNOSIS — H524 Presbyopia: Secondary | ICD-10-CM | POA: Diagnosis not present

## 2015-12-04 ENCOUNTER — Encounter (HOSPITAL_COMMUNITY): Payer: Self-pay | Admitting: Physician Assistant

## 2015-12-04 DIAGNOSIS — M1711 Unilateral primary osteoarthritis, right knee: Secondary | ICD-10-CM | POA: Diagnosis present

## 2015-12-04 NOTE — H&P (Signed)
TOTAL KNEE ADMISSION H&P  Patient is being admitted for right total knee arthroplasty.  Subjective:  Chief Complaint:right knee pain.  HPI: Elizabeth Stone, 70 y.o. female, has a history of pain and functional disability in the right knee due to arthritis and has failed non-surgical conservative treatments for greater than 12 weeks to includeNSAID's and/or analgesics, corticosteriod injections, viscosupplementation injections, flexibility and strengthening excercises, supervised PT with diminished ADL's post treatment, use of assistive devices, weight reduction as appropriate and activity modification.  Onset of symptoms was gradual, starting 10 years ago with gradually worsening course since that time. The patient noted no past surgery on the right knee(s).  Patient currently rates pain in the right knee(s) at 10 out of 10 with activity. Patient has night pain, worsening of pain with activity and weight bearing, pain that interferes with activities of daily living, crepitus and joint swelling.  Patient has evidence of subchondral sclerosis, periarticular osteophytes and joint space narrowing by imaging studies. There is no active infection.  Patient Active Problem List   Diagnosis Date Noted  . Primary localized osteoarthritis of right knee   . Rotator cuff tear arthropathy 12/13/2014  . Ulcer of right second toe ([place]) 12/21/2013  . Family history of ischemic heart disease 09/27/2013  . Essential hypertension, benign 09/27/2013  . Mixed hyperlipidemia 09/27/2013  . Arthritis   . Anxiety   . Depression   . GERD (gastroesophageal reflux disease)   . Meniere disease   . Osteoporosis   . Hearing impaired    Past Medical History  Diagnosis Date  . Arthritis   . Anxiety   . Depression   . GERD (gastroesophageal reflux disease)   . Meniere disease     takes HCTZ for her inner ear problem  . Osteoporosis   . Hearing impaired     has cochlear implant  . Mixed hyperlipidemia 09/27/2013  .  Fibromyalgia   . HTN (hypertension)   . Primary localized osteoarthritis of right knee     Past Surgical History  Procedure Laterality Date  . Abdominal hysterectomy    . Bladder tack    . Cochlear implant      2007  . Hammer toe surgery      left foot  . Reverse shoulder arthroplasty Right 12/13/2014    dr Tamera Punt  . Reverse shoulder arthroplasty Right 12/13/2014    Procedure: REVERSE SHOULDER ARTHROPLASTY;  Surgeon: Nita Sells, MD;  Location: [facility];  Service: Orthopedics;  Laterality: Right;  Right reverse total shoulder replacement    No prescriptions prior to admission   No Known Allergies  Social History  Substance Use Topics  . Smoking status: Former Smoker    Quit date: 08/25/1993  . Smokeless tobacco: Never Used  . Alcohol Use: 3.6 oz/week    6 Glasses of wine per week    Family History  Problem Relation Age of Onset  . Colon cancer Neg Hx   . Esophageal cancer Neg Hx   . Rectal cancer Neg Hx   . Stomach cancer Neg Hx   . Heart disease Mother   . Breast cancer Mother   . Cervical cancer Mother   . Diabetes Mother   . Heart disease Father   . Diabetes Maternal Aunt      Review of Systems  Constitutional: Negative.   HENT: Negative.   Eyes: Negative.   Respiratory: Negative.   Cardiovascular: Negative.   Gastrointestinal: Negative.   Genitourinary: Negative.   Musculoskeletal: Positive for  back pain and joint pain.       Right knee pain  Skin: Negative.   Neurological: Negative.   Endo/Heme/Allergies: Negative.   Psychiatric/Behavioral: Negative.     Objective:  Physical Exam  Constitutional: She is oriented to person, place, and time. She appears well-developed and well-nourished.  HENT:  Head: Normocephalic and atraumatic.  Mouth/Throat: Oropharynx is clear and moist.  Eyes: Conjunctivae are normal. Pupils are equal, round, and reactive to light.  Neck: Neck supple.  Cardiovascular: Normal rate, regular rhythm and intact distal  pulses.   Respiratory: Effort normal and breath sounds normal.  GI: Soft. Bowel sounds are normal.  Genitourinary:  Not pertinent to current symptomatology therefore not examined.  Musculoskeletal:  Examination of her right knee reveals pain medially and laterally.  Moderate valgus deformity.  1+ synovitis.  Range of motion -5 to 125 degrees.  Knee is stable with normal patella tracking.  Examination of the left knee reveals full range of motion without pain, swelling, weakness or instability.  Vascular exam: Pulses are 2+ and symmetric.   Neurological: She is alert and oriented to person, place, and time.  Skin: Skin is warm and dry.  Psychiatric: She has a normal mood and affect. Her behavior is normal.    Vital signs in last 24 hours: Temp:  [98.1 F (36.7 C)] 98.1 F (36.7 C) (02/15 1500) Pulse Rate:  [85] 85 (02/15 1500) BP: (127)/(74) 127/74 mmHg (02/15 1500) SpO2:  [96 %] 96 % (02/15 1500) Weight:  [65.318 kg (144 lb)] 65.318 kg (144 lb) (02/15 1500)  Labs:   Estimated body mass index is 25.51 kg/(m^2) as calculated from the following:   Height as of this encounter: 5\' 3"  (1.6 m).   Weight as of this encounter: 65.318 kg (144 lb).   Imaging Review  XRAYS DONE 07/30/1915 Plain radiographs demonstrate severe degenerative joint disease of the right knee(s). The overall alignment issignificant valgus. The bone quality appears to be good for age and reported activity level.  Assessment/Plan:  End stage arthritis, right knee  Principal Problem:   Primary localized osteoarthritis of right knee Active Problems:   Arthritis   Anxiety   Depression   GERD (gastroesophageal reflux disease)   Meniere disease   Hearing impaired   Family history of ischemic heart disease   Essential hypertension, benign   Mixed hyperlipidemia   The patient history, physical examination, clinical judgment of the provider and imaging studies are consistent with end stage degenerative joint  disease of the right knee(s) and total knee arthroplasty is deemed medically necessary. The treatment options including medical management, injection therapy arthroscopy and arthroplasty were discussed at length. The risks and benefits of total knee arthroplasty were presented and reviewed. The risks due to aseptic loosening, infection, stiffness, patella tracking problems, thromboembolic complications and other imponderables were discussed. The patient acknowledged the explanation, agreed to proceed with the plan and consent was signed. Patient is being admitted for inpatient treatment for surgery, pain control, PT, OT, prophylactic antibiotics, VTE prophylaxis, progressive ambulation and ADL's and discharge planning. The patient is planning to be discharged home with home health services but may go to [facility] if daughter in law get the job that she interviewed for.

## 2015-12-05 ENCOUNTER — Ambulatory Visit: Payer: Medicare Other | Admitting: Cardiology

## 2015-12-05 ENCOUNTER — Encounter (HOSPITAL_COMMUNITY): Payer: Self-pay

## 2015-12-05 ENCOUNTER — Encounter (HOSPITAL_COMMUNITY)
Admission: RE | Admit: 2015-12-05 | Discharge: 2015-12-05 | Disposition: A | Discharge status: Home / Self Care | Payer: Medicare Other | Source: Ambulatory Visit | Attending: Orthopedic Surgery | Admitting: Orthopedic Surgery

## 2015-12-05 DIAGNOSIS — M1711 Unilateral primary osteoarthritis, right knee: Secondary | ICD-10-CM | POA: Diagnosis not present

## 2015-12-05 DIAGNOSIS — E785 Hyperlipidemia, unspecified: Secondary | ICD-10-CM | POA: Insufficient documentation

## 2015-12-05 DIAGNOSIS — Z96611 Presence of right artificial shoulder joint: Secondary | ICD-10-CM | POA: Insufficient documentation

## 2015-12-05 DIAGNOSIS — Z87891 Personal history of nicotine dependence: Secondary | ICD-10-CM | POA: Diagnosis not present

## 2015-12-05 DIAGNOSIS — Z79899 Other long term (current) drug therapy: Secondary | ICD-10-CM | POA: Insufficient documentation

## 2015-12-05 DIAGNOSIS — Z0183 Encounter for blood typing: Secondary | ICD-10-CM | POA: Insufficient documentation

## 2015-12-05 DIAGNOSIS — M797 Fibromyalgia: Secondary | ICD-10-CM | POA: Insufficient documentation

## 2015-12-05 DIAGNOSIS — K219 Gastro-esophageal reflux disease without esophagitis: Secondary | ICD-10-CM | POA: Diagnosis not present

## 2015-12-05 DIAGNOSIS — Z8249 Family history of ischemic heart disease and other diseases of the circulatory system: Secondary | ICD-10-CM | POA: Insufficient documentation

## 2015-12-05 DIAGNOSIS — Z7982 Long term (current) use of aspirin: Secondary | ICD-10-CM | POA: Diagnosis not present

## 2015-12-05 DIAGNOSIS — F319 Bipolar disorder, unspecified: Secondary | ICD-10-CM | POA: Insufficient documentation

## 2015-12-05 DIAGNOSIS — Z01812 Encounter for preprocedural laboratory examination: Secondary | ICD-10-CM | POA: Diagnosis not present

## 2015-12-05 DIAGNOSIS — F419 Anxiety disorder, unspecified: Secondary | ICD-10-CM | POA: Diagnosis not present

## 2015-12-05 DIAGNOSIS — Z01818 Encounter for other preprocedural examination: Secondary | ICD-10-CM | POA: Insufficient documentation

## 2015-12-05 HISTORY — DX: Bipolar disorder, unspecified: F31.9

## 2015-12-05 LAB — CBC WITH DIFFERENTIAL/PLATELET
BASOS PCT: 1 %
Basophils Absolute: 0 10*3/uL (ref 0.0–0.1)
EOS ABS: 0.1 10*3/uL (ref 0.0–0.7)
Eosinophils Relative: 1 %
HCT: 46.7 % — ABNORMAL HIGH (ref 36.0–46.0)
Hemoglobin: 15.8 g/dL — ABNORMAL HIGH (ref 12.0–15.0)
LYMPHS ABS: 1.6 10*3/uL (ref 0.7–4.0)
Lymphocytes Relative: 27 %
MCH: 32.2 pg (ref 26.0–34.0)
MCHC: 33.8 g/dL (ref 30.0–36.0)
MCV: 95.3 fL (ref 78.0–100.0)
MONO ABS: 0.8 10*3/uL (ref 0.1–1.0)
MONOS PCT: 12 %
Neutro Abs: 3.6 10*3/uL (ref 1.7–7.7)
Neutrophils Relative %: 59 %
Platelets: 256 10*3/uL (ref 150–400)
RBC: 4.9 MIL/uL (ref 3.87–5.11)
RDW: 12.1 % (ref 11.5–15.5)
WBC: 6.1 10*3/uL (ref 4.0–10.5)

## 2015-12-05 LAB — SURGICAL PCR SCREEN
MRSA, PCR: NEGATIVE
STAPHYLOCOCCUS AUREUS: POSITIVE — AB

## 2015-12-05 LAB — TYPE AND SCREEN
ABO/RH(D): B POS
Antibody Screen: NEGATIVE

## 2015-12-05 LAB — COMPREHENSIVE METABOLIC PANEL
ALBUMIN: 4.1 g/dL (ref 3.5–5.0)
ALK PHOS: 100 U/L (ref 38–126)
ALT: 29 U/L (ref 14–54)
ANION GAP: 11 (ref 5–15)
AST: 35 U/L (ref 15–41)
BILIRUBIN TOTAL: 0.4 mg/dL (ref 0.3–1.2)
BUN: 10 mg/dL (ref 6–20)
CO2: 24 mmol/L (ref 22–32)
CREATININE: 0.71 mg/dL (ref 0.44–1.00)
Calcium: 10.9 mg/dL — ABNORMAL HIGH (ref 8.9–10.3)
Chloride: 103 mmol/L (ref 101–111)
GFR calc non Af Amer: >60 mL/min (ref >60)
Glucose, Bld: 74 mg/dL (ref 65–99)
POTASSIUM: 4.3 mmol/L (ref 3.5–5.1)
SODIUM: 138 mmol/L (ref 135–145)
Total Protein: 7.1 g/dL (ref 6.5–8.1)

## 2015-12-05 LAB — APTT: aPTT: 32 seconds (ref 24–37)

## 2015-12-05 LAB — PROTIME-INR
INR: 1.09 (ref 0.00–1.49)
PROTHROMBIN TIME: 14.3 s (ref 11.6–15.2)

## 2015-12-05 NOTE — Pre-Procedure Instructions (Signed)
Elizabeth Stone  12/05/2015      [facility] - [place], [place] [address] Phone: [phone] Fax: [phone]    Your procedure is scheduled on Monday February 27.  Report to [facility] Admitting at 8 A.M.  Call this number if you have problems the morning of surgery:  [phone]   Remember:  Do not eat food or drink liquids after midnight Sunday February 26th.  Take these medicines the morning of surgery with A SIP OF WATER bupropion (wellbutrin), cymbalta, lamotrigine (lamictal)  STOP: ALL Vitamins, Supplements, Effient and Herbal Medications, Fish Oils, Aspirins, NSAIDs- celebrex (Nonsteroidal Anti-inflammatories such as Ibuprofen, Aleve, or Advil), and Goody's/BC Powders on Monday Feb 20th, until after surgery as directed by your physician.   Stop your baby aspirin 5-7 days before surgery as directed by your cardiologist.     Do not wear jewelry, make-up or nail polish.  Do not wear lotions, powders, or perfumes.  You may wear deodorant.  Do not shave 48 hours prior to surgery.    Do not bring valuables to the hospital.  [facility] is not responsible for any belongings or valuables.  Contacts, dentures or bridgework may not be worn into surgery.  Leave your suitcase in the car.  After surgery it may be brought to your room.  For patients admitted to the hospital, discharge time will be determined by your treatment team.  Patients discharged the day of surgery will not be allowed to drive home.        Preparing for Surgery at [facility]  Before surgery, you can play an important role.  Because skin is not sterile, your skin needs to be as free of germs as possible.  You can reduce the number of germs on your skin by washing with CHG (chlorahexidine gluconate) Soap before surgery.  CHG is an antiseptic cleaner with kills germs and bonds with the skin to continue killing germs even after  washing.   Please do not use if you have an allergy to CHG or antibacterial soaps.  If your skin becomes reddened/irritated stop using the CHG.  Do not shave (including legs and underarms) for at least 48 hours prior to first CHG shower.  It is okay to shave your face.  Please follow these instructions carefully:  1. Shower with CHG Soap the night before surgery and the morning of Surgery. 2. If you choose to wash your hair, wash your hair first as usual with your normal shampoo. 3. After you shampoo, rinse your hair and body thoroughly to remove the Shampoo. 4. Use CHG as you would any other liquid soap. You can apply chg directly to the skin and wash gently with scrungie or a clean washcloth. 5. Apply the CHG Soap to your body ONLY FROM THE NECK DOWN. Do not use on open wounds or open sores. Avoid contact with your eyes, ears, mouth and genitals (private parts). Wash genitals (private parts) with your normal soap. 6. Wash thoroughly, paying special attention to the area where your surgery will be performed. 7. Thoroughly rinse your body with warm water from the neck down. 8. DO NOT shower/wash with your normal soap after using and rinsing off the CHG Soap. 9. Pat yourself dry with a clean towel.  10. Wear clean pajamas.  11. Place clean sheets on your bed the night of your first shower and do not sleep with pets.  Day of Surgery  Do not apply any lotions/deodorants the morning of surgery. Please wear clean clothes to the hospital/surgery center.  Please read over the following fact sheets that you were given. Pain Booklet, Coughing and Deep Breathing, Blood Transfusion Information, MRSA Information and Surgical Site Infection Prevention

## 2015-12-05 NOTE — Progress Notes (Signed)
Patient has clearance note from Richardson Dopp PA for cardiology in [facility] from 11/18/2015.  EKG: 11/19/15 Stress: 12/05/14  pcp is Blanchie Serve, MD

## 2015-12-05 NOTE — Progress Notes (Signed)
Mupirocin Ointment Rx called into [facility] for positive PCR of Staph. Left message on pt's voicemail informing her of results and instructing her to pick up Rx.

## 2015-12-06 LAB — URINE CULTURE: Culture: 2000

## 2015-12-06 NOTE — Progress Notes (Signed)
Anesthesia Chart Review:  Pt is a 70 year old female scheduled for R total knee arthroplasty on 12/16/2015 with Dr. Noemi Chapel.   History includes former smoker, HLD, fibromyalgia, GERD, anxiety, bipolar disorder, hearing impaired with history of Cochlear implant, Meniere disease. S/p R shoulder arthroplasty 12/13/14. BMI 25.5  PCP is listed as Dr. Blanchie Serve.   Meds include ASA 81mg , Lipitor, Wellbutrin XL, Celebrex, Klonopin, HCTZ, KCL, Lamictal, Singulair, Saphris.  Cardiologist is Dr. Candee Furbish, last seen on 11/18/15 by Richardson Dopp, PA. Patient has a family history of CAD.   ETT 12/05/14:  - Interpretation: normal - no evidence of ischemia by ST analysis.  - Comments: Hypertensive response to exercise. Fair exercise tolerance  11/18/15 EKG: NSR.  Preoperative labs noted.   Pt has cardiac clearance for surgery from Richardson Dopp, [place] in 11/18/15 note.  If no changes, I anticipate pt can proceed with surgery as scheduled.   Willeen Cass, FNP-BC [facility] Short Stay Surgical Center/Anesthesiology Phone: [phone] 12/06/2015 11:50 AM

## 2015-12-13 NOTE — Progress Notes (Signed)
I spoke with patient and instructed her of new arrival time of 27.

## 2015-12-15 MED ORDER — CEFAZOLIN SODIUM-DEXTROSE 2-3 GM-% IV SOLR
2.0000 g | INTRAVENOUS | Status: AC
  Administered 2015-12-16: 2 g via INTRAVENOUS
  Filled 2015-12-15: qty 50

## 2015-12-16 ENCOUNTER — Encounter (HOSPITAL_COMMUNITY)
Admission: RE | Disposition: A | Discharge status: Skilled Nursing Facility | Payer: Self-pay | Source: Ambulatory Visit | Attending: Orthopedic Surgery

## 2015-12-16 ENCOUNTER — Inpatient Hospital Stay (HOSPITAL_COMMUNITY): Payer: Medicare Other | Admitting: Certified Registered Nurse Anesthetist

## 2015-12-16 ENCOUNTER — Inpatient Hospital Stay (HOSPITAL_COMMUNITY)
Admission: RE | Admit: 2015-12-16 | Discharge: 2015-12-19 | DRG: 470 | Disposition: A | Discharge status: Skilled Nursing Facility | Payer: Medicare Other | Source: Ambulatory Visit | Attending: Orthopedic Surgery | Admitting: Orthopedic Surgery

## 2015-12-16 ENCOUNTER — Inpatient Hospital Stay (HOSPITAL_COMMUNITY): Payer: Medicare Other | Admitting: Emergency Medicine

## 2015-12-16 ENCOUNTER — Encounter (HOSPITAL_COMMUNITY): Payer: Self-pay | Admitting: Surgery

## 2015-12-16 DIAGNOSIS — K219 Gastro-esophageal reflux disease without esophagitis: Secondary | ICD-10-CM | POA: Diagnosis present

## 2015-12-16 DIAGNOSIS — H8109 Meniere's disease, unspecified ear: Secondary | ICD-10-CM | POA: Diagnosis not present

## 2015-12-16 DIAGNOSIS — Z96619 Presence of unspecified artificial shoulder joint: Secondary | ICD-10-CM | POA: Diagnosis present

## 2015-12-16 DIAGNOSIS — M179 Osteoarthritis of knee, unspecified: Secondary | ICD-10-CM | POA: Diagnosis not present

## 2015-12-16 DIAGNOSIS — M797 Fibromyalgia: Secondary | ICD-10-CM | POA: Diagnosis present

## 2015-12-16 DIAGNOSIS — Z96651 Presence of right artificial knee joint: Secondary | ICD-10-CM | POA: Diagnosis not present

## 2015-12-16 DIAGNOSIS — Z8249 Family history of ischemic heart disease and other diseases of the circulatory system: Secondary | ICD-10-CM | POA: Diagnosis not present

## 2015-12-16 DIAGNOSIS — I10 Essential (primary) hypertension: Secondary | ICD-10-CM | POA: Diagnosis not present

## 2015-12-16 DIAGNOSIS — IMO0001 Reserved for inherently not codable concepts without codable children: Secondary | ICD-10-CM

## 2015-12-16 DIAGNOSIS — H9190 Unspecified hearing loss, unspecified ear: Secondary | ICD-10-CM

## 2015-12-16 DIAGNOSIS — M25561 Pain in right knee: Secondary | ICD-10-CM | POA: Diagnosis not present

## 2015-12-16 DIAGNOSIS — Z87891 Personal history of nicotine dependence: Secondary | ICD-10-CM | POA: Diagnosis not present

## 2015-12-16 DIAGNOSIS — M810 Age-related osteoporosis without current pathological fracture: Secondary | ICD-10-CM | POA: Diagnosis present

## 2015-12-16 DIAGNOSIS — R2681 Unsteadiness on feet: Secondary | ICD-10-CM | POA: Diagnosis not present

## 2015-12-16 DIAGNOSIS — G8918 Other acute postprocedural pain: Secondary | ICD-10-CM | POA: Diagnosis not present

## 2015-12-16 DIAGNOSIS — F32A Depression, unspecified: Secondary | ICD-10-CM | POA: Diagnosis present

## 2015-12-16 DIAGNOSIS — M1711 Unilateral primary osteoarthritis, right knee: Secondary | ICD-10-CM | POA: Diagnosis present

## 2015-12-16 DIAGNOSIS — E782 Mixed hyperlipidemia: Secondary | ICD-10-CM | POA: Diagnosis not present

## 2015-12-16 DIAGNOSIS — F419 Anxiety disorder, unspecified: Secondary | ICD-10-CM | POA: Diagnosis not present

## 2015-12-16 DIAGNOSIS — Z471 Aftercare following joint replacement surgery: Secondary | ICD-10-CM | POA: Diagnosis not present

## 2015-12-16 DIAGNOSIS — M1990 Unspecified osteoarthritis, unspecified site: Secondary | ICD-10-CM | POA: Diagnosis present

## 2015-12-16 DIAGNOSIS — F329 Major depressive disorder, single episode, unspecified: Secondary | ICD-10-CM | POA: Diagnosis present

## 2015-12-16 DIAGNOSIS — M6281 Muscle weakness (generalized): Secondary | ICD-10-CM | POA: Diagnosis not present

## 2015-12-16 HISTORY — DX: Unilateral primary osteoarthritis, right knee: M17.11

## 2015-12-16 HISTORY — PX: TOTAL KNEE ARTHROPLASTY: SHX125

## 2015-12-16 LAB — GLUCOSE, CAPILLARY: Glucose-Capillary: 74 mg/dL (ref 65–99)

## 2015-12-16 SURGERY — ARTHROPLASTY, KNEE, TOTAL
Anesthesia: Regional | Laterality: Right

## 2015-12-16 MED ORDER — DOCUSATE SODIUM 100 MG PO CAPS
100.0000 mg | ORAL_CAPSULE | Freq: Two times a day (BID) | ORAL | Status: DC
  Administered 2015-12-16 – 2015-12-19 (×6): 100 mg via ORAL
  Filled 2015-12-16 (×6): qty 1

## 2015-12-16 MED ORDER — ALUM & MAG HYDROXIDE-SIMETH 200-200-20 MG/5ML PO SUSP: 30.0000 mL | ORAL | Status: DC | PRN

## 2015-12-16 MED ORDER — POVIDONE-IODINE 7.5 % EX SOLN
Freq: Once | CUTANEOUS | Status: DC
  Filled 2015-12-16: qty 118

## 2015-12-16 MED ORDER — DEXAMETHASONE SODIUM PHOSPHATE 10 MG/ML IJ SOLN
10.0000 mg | Freq: Three times a day (TID) | INTRAMUSCULAR | Status: AC
  Administered 2015-12-16 – 2015-12-17 (×4): 10 mg via INTRAVENOUS
  Filled 2015-12-16 (×8): qty 1

## 2015-12-16 MED ORDER — CLONAZEPAM 0.5 MG PO TABS
0.2500 mg | ORAL_TABLET | Freq: Every day | ORAL | Status: DC
  Administered 2015-12-16: 0.25 mg via ORAL
  Filled 2015-12-16: qty 1

## 2015-12-16 MED ORDER — HYDROMORPHONE HCL 1 MG/ML IJ SOLN
1.0000 mg | INTRAMUSCULAR | Status: DC | PRN
  Administered 2015-12-16 – 2015-12-17 (×6): 1 mg via INTRAVENOUS
  Filled 2015-12-16 (×6): qty 1

## 2015-12-16 MED ORDER — ONDANSETRON HCL 4 MG PO TABS: 4.0000 mg | ORAL_TABLET | Freq: Four times a day (QID) | ORAL | Status: DC | PRN

## 2015-12-16 MED ORDER — ATORVASTATIN CALCIUM 10 MG PO TABS
10.0000 mg | ORAL_TABLET | Freq: Every day | ORAL | Status: DC
  Administered 2015-12-16 – 2015-12-18 (×3): 10 mg via ORAL
  Filled 2015-12-16 (×3): qty 1

## 2015-12-16 MED ORDER — POTASSIUM CHLORIDE IN NACL 20-0.9 MEQ/L-% IV SOLN
INTRAVENOUS | Status: DC
  Administered 2015-12-17: 02:00:00 via INTRAVENOUS
  Filled 2015-12-16: qty 1000

## 2015-12-16 MED ORDER — CEFAZOLIN SODIUM-DEXTROSE 2-3 GM-% IV SOLR
2.0000 g | Freq: Four times a day (QID) | INTRAVENOUS | Status: AC
  Administered 2015-12-16 (×2): 2 g via INTRAVENOUS
  Filled 2015-12-16 (×2): qty 50

## 2015-12-16 MED ORDER — LAMOTRIGINE 100 MG PO TABS
200.0000 mg | ORAL_TABLET | Freq: Every day | ORAL | Status: DC
  Administered 2015-12-16 – 2015-12-19 (×4): 200 mg via ORAL
  Filled 2015-12-16 (×4): qty 2

## 2015-12-16 MED ORDER — BUPROPION HCL ER (XL) 150 MG PO TB24
300.0000 mg | ORAL_TABLET | Freq: Every day | ORAL | Status: DC
  Administered 2015-12-17 – 2015-12-19 (×3): 300 mg via ORAL
  Filled 2015-12-16 (×3): qty 2

## 2015-12-16 MED ORDER — VITAMIN C 500 MG PO TABS
500.0000 mg | ORAL_TABLET | Freq: Two times a day (BID) | ORAL | Status: DC
  Administered 2015-12-16 – 2015-12-19 (×6): 500 mg via ORAL
  Filled 2015-12-16 (×6): qty 1

## 2015-12-16 MED ORDER — SODIUM CHLORIDE 0.9 % IR SOLN
Status: DC | PRN
  Administered 2015-12-16: 1000 mL

## 2015-12-16 MED ORDER — ACETAMINOPHEN 650 MG RE SUPP: 650.0000 mg | Freq: Four times a day (QID) | RECTAL | Status: DC | PRN

## 2015-12-16 MED ORDER — METOCLOPRAMIDE HCL 5 MG PO TABS: 5.0000 mg | ORAL_TABLET | Freq: Three times a day (TID) | ORAL | Status: DC | PRN

## 2015-12-16 MED ORDER — MONTELUKAST SODIUM 10 MG PO TABS
10.0000 mg | ORAL_TABLET | Freq: Every day | ORAL | Status: DC
  Administered 2015-12-16 – 2015-12-18 (×3): 10 mg via ORAL
  Filled 2015-12-16 (×3): qty 1

## 2015-12-16 MED ORDER — MIDAZOLAM HCL 5 MG/5ML IJ SOLN
INTRAMUSCULAR | Status: DC | PRN
  Administered 2015-12-16: 1 mg via INTRAVENOUS
  Administered 2015-12-16 (×2): 0.5 mg via INTRAVENOUS

## 2015-12-16 MED ORDER — MENTHOL 3 MG MT LOZG: 1.0000 | LOZENGE | OROMUCOSAL | Status: DC | PRN

## 2015-12-16 MED ORDER — PROPOFOL 10 MG/ML IV BOLUS
INTRAVENOUS | Status: AC
  Filled 2015-12-16: qty 20

## 2015-12-16 MED ORDER — LACTATED RINGERS IV SOLN
INTRAVENOUS | Status: DC | PRN
  Administered 2015-12-16 (×2): via INTRAVENOUS

## 2015-12-16 MED ORDER — FENTANYL CITRATE (PF) 250 MCG/5ML IJ SOLN
INTRAMUSCULAR | Status: AC
  Filled 2015-12-16: qty 5

## 2015-12-16 MED ORDER — METOCLOPRAMIDE HCL 5 MG/ML IJ SOLN: 5.0000 mg | Freq: Three times a day (TID) | INTRAMUSCULAR | Status: DC | PRN

## 2015-12-16 MED ORDER — POLYETHYLENE GLYCOL 3350 17 G PO PACK
17.0000 g | PACK | Freq: Two times a day (BID) | ORAL | Status: DC
  Administered 2015-12-16 – 2015-12-19 (×6): 17 g via ORAL
  Filled 2015-12-16 (×6): qty 1

## 2015-12-16 MED ORDER — MIDAZOLAM HCL 2 MG/2ML IJ SOLN
INTRAMUSCULAR | Status: AC
  Administered 2015-12-16: 2 mg
  Filled 2015-12-16: qty 2

## 2015-12-16 MED ORDER — DULOXETINE HCL 60 MG PO CPEP
60.0000 mg | ORAL_CAPSULE | Freq: Two times a day (BID) | ORAL | Status: DC
  Administered 2015-12-16 – 2015-12-19 (×6): 60 mg via ORAL
  Filled 2015-12-16 (×6): qty 1

## 2015-12-16 MED ORDER — DEXAMETHASONE SODIUM PHOSPHATE 10 MG/ML IJ SOLN
INTRAMUSCULAR | Status: DC | PRN
Start: 2015-12-16 — End: 2015-12-16
  Administered 2015-12-16: 10 mg via INTRAVENOUS

## 2015-12-16 MED ORDER — FENTANYL CITRATE (PF) 100 MCG/2ML IJ SOLN
INTRAMUSCULAR | Status: AC
  Administered 2015-12-16: 50 ug via INTRAVENOUS
  Filled 2015-12-16: qty 2

## 2015-12-16 MED ORDER — BUPIVACAINE-EPINEPHRINE (PF) 0.25% -1:200000 IJ SOLN
INTRAMUSCULAR | Status: AC
  Filled 2015-12-16: qty 30

## 2015-12-16 MED ORDER — ONDANSETRON HCL 4 MG/2ML IJ SOLN: 4.0000 mg | Freq: Four times a day (QID) | INTRAMUSCULAR | Status: DC | PRN

## 2015-12-16 MED ORDER — LACTATED RINGERS IV SOLN
INTRAVENOUS | Status: DC
  Administered 2015-12-16: 09:00:00 via INTRAVENOUS

## 2015-12-16 MED ORDER — PROPOFOL 500 MG/50ML IV EMUL
INTRAVENOUS | Status: DC | PRN
  Administered 2015-12-16: 75 ug/kg/min via INTRAVENOUS

## 2015-12-16 MED ORDER — MIDAZOLAM HCL 2 MG/2ML IJ SOLN
INTRAMUSCULAR | Status: AC
  Filled 2015-12-16: qty 2

## 2015-12-16 MED ORDER — FENTANYL CITRATE (PF) 100 MCG/2ML IJ SOLN
25.0000 ug | INTRAMUSCULAR | Status: DC | PRN
  Administered 2015-12-16 (×3): 50 ug via INTRAVENOUS

## 2015-12-16 MED ORDER — FENTANYL CITRATE (PF) 100 MCG/2ML IJ SOLN
INTRAMUSCULAR | Status: AC
  Administered 2015-12-16: 50 ug
  Filled 2015-12-16: qty 2

## 2015-12-16 MED ORDER — BUPIVACAINE-EPINEPHRINE 0.25% -1:200000 IJ SOLN
INTRAMUSCULAR | Status: DC | PRN
  Administered 2015-12-16: 30 mL

## 2015-12-16 MED ORDER — CELECOXIB 200 MG PO CAPS
200.0000 mg | ORAL_CAPSULE | Freq: Two times a day (BID) | ORAL | Status: DC
  Administered 2015-12-16 – 2015-12-19 (×7): 200 mg via ORAL
  Filled 2015-12-16 (×7): qty 1

## 2015-12-16 MED ORDER — CHLORHEXIDINE GLUCONATE 4 % EX LIQD: 60.0000 mL | Freq: Once | CUTANEOUS | Status: DC

## 2015-12-16 MED ORDER — ROCURONIUM BROMIDE 50 MG/5ML IV SOLN
INTRAVENOUS | Status: AC
  Filled 2015-12-16: qty 2

## 2015-12-16 MED ORDER — VITAMIN D 1000 UNITS PO TABS
1000.0000 [IU] | ORAL_TABLET | Freq: Every day | ORAL | Status: DC
  Administered 2015-12-17 – 2015-12-19 (×3): 1000 [IU] via ORAL
  Filled 2015-12-16 (×3): qty 1

## 2015-12-16 MED ORDER — OXYCODONE HCL 5 MG PO TABS
ORAL_TABLET | ORAL | Status: AC
  Filled 2015-12-16: qty 2

## 2015-12-16 MED ORDER — ASENAPINE MALEATE 5 MG SL SUBL
10.0000 mg | SUBLINGUAL_TABLET | Freq: Every day | SUBLINGUAL | Status: DC
  Administered 2015-12-17 – 2015-12-18 (×2): 10 mg via SUBLINGUAL
  Filled 2015-12-16 (×3): qty 2

## 2015-12-16 MED ORDER — ACETAMINOPHEN 325 MG PO TABS: 650.0000 mg | ORAL_TABLET | Freq: Four times a day (QID) | ORAL | Status: DC | PRN

## 2015-12-16 MED ORDER — OXYCODONE HCL 5 MG PO TABS
5.0000 mg | ORAL_TABLET | ORAL | Status: DC | PRN
  Administered 2015-12-16 – 2015-12-19 (×13): 10 mg via ORAL
  Filled 2015-12-16 (×13): qty 2

## 2015-12-16 MED ORDER — DIPHENHYDRAMINE HCL 12.5 MG/5ML PO ELIX
12.5000 mg | ORAL_SOLUTION | ORAL | Status: DC | PRN
  Administered 2015-12-16: 25 mg via ORAL
  Administered 2015-12-17: 12.5 mg via ORAL
  Administered 2015-12-17: 25 mg via ORAL
  Filled 2015-12-16 (×3): qty 10

## 2015-12-16 MED ORDER — PHENOL 1.4 % MT LIQD
1.0000 | OROMUCOSAL | Status: DC | PRN
Start: 2015-12-16 — End: 2015-12-20

## 2015-12-16 MED ORDER — ASPIRIN EC 325 MG PO TBEC
325.0000 mg | DELAYED_RELEASE_TABLET | Freq: Every day | ORAL | Status: DC
  Administered 2015-12-17 – 2015-12-19 (×3): 325 mg via ORAL
  Filled 2015-12-16 (×3): qty 1

## 2015-12-16 SURGICAL SUPPLY — 59 items
BANDAGE ELASTIC 6 VELCRO ST LF (GAUZE/BANDAGES/DRESSINGS) ×2 IMPLANT
BANDAGE ESMARK 6X9 LF (GAUZE/BANDAGES/DRESSINGS) ×1 IMPLANT
BENZOIN TINCTURE PRP APPL 2/3 (GAUZE/BANDAGES/DRESSINGS) ×2 IMPLANT
BLADE SAGITTAL 25.0X1.19X90 (BLADE) ×2 IMPLANT
BLADE SAW RECIP 87.9 MT (BLADE) ×2 IMPLANT
BLADE SAW SAG 90X13X1.27 (BLADE) ×2 IMPLANT
BLADE SURG 10 STRL SS (BLADE) ×4 IMPLANT
BNDG ELASTIC 6X15 VLCR STRL LF (GAUZE/BANDAGES/DRESSINGS) ×2 IMPLANT
BNDG ESMARK 6X9 LF (GAUZE/BANDAGES/DRESSINGS) ×2
BOWL SMART MIX CTS (DISPOSABLE) ×2 IMPLANT
CAPT KNEE TOTAL 3 ATTUNE ×2 IMPLANT
CEMENT HV SMART SET (Cement) ×4 IMPLANT
COVER SURGICAL LIGHT HANDLE (MISCELLANEOUS) ×2 IMPLANT
CUFF TOURNIQUET SINGLE 34IN LL (TOURNIQUET CUFF) ×2 IMPLANT
CUFF TOURNIQUET SINGLE 44IN (TOURNIQUET CUFF) IMPLANT
DECANTER SPIKE VIAL GLASS SM (MISCELLANEOUS) ×2 IMPLANT
DRAPE EXTREMITY T 121X128X90 (DRAPE) ×2 IMPLANT
DRAPE INCISE IOBAN 66X45 STRL (DRAPES) ×2 IMPLANT
DRAPE PROXIMA HALF (DRAPES) ×2 IMPLANT
DRAPE U-SHAPE 47X51 STRL (DRAPES) ×2 IMPLANT
DRSG AQUACEL AG ADV 3.5X10 (GAUZE/BANDAGES/DRESSINGS) ×2 IMPLANT
DRSG AQUACEL AG ADV 3.5X14 (GAUZE/BANDAGES/DRESSINGS) ×2 IMPLANT
DURAPREP 26ML APPLICATOR (WOUND CARE) ×4 IMPLANT
ELECT CAUTERY BLADE 6.4 (BLADE) ×2 IMPLANT
ELECT REM PT RETURN 9FT ADLT (ELECTROSURGICAL) ×2
ELECTRODE REM PT RTRN 9FT ADLT (ELECTROSURGICAL) ×1 IMPLANT
FACESHIELD WRAPAROUND (MASK) ×2 IMPLANT
GOWN STRL REUS W/ TWL LRG LVL3 (GOWN DISPOSABLE) ×1 IMPLANT
GOWN STRL REUS W/ TWL XL LVL3 (GOWN DISPOSABLE) ×2 IMPLANT
GOWN STRL REUS W/TWL LRG LVL3 (GOWN DISPOSABLE) ×1
GOWN STRL REUS W/TWL XL LVL3 (GOWN DISPOSABLE) ×2
HANDPIECE INTERPULSE COAX TIP (DISPOSABLE) ×1
HOOD PEEL AWAY FACE SHEILD DIS (HOOD) ×4 IMPLANT
IMMOBILIZER KNEE 22 UNIV (SOFTGOODS) ×2 IMPLANT
KIT BASIN OR (CUSTOM PROCEDURE TRAY) ×2 IMPLANT
KIT ROOM TURNOVER OR (KITS) ×2 IMPLANT
MANIFOLD NEPTUNE II (INSTRUMENTS) ×2 IMPLANT
MARKER SKIN DUAL TIP RULER LAB (MISCELLANEOUS) ×2 IMPLANT
NEEDLE 18GX1X1/2 (RX/OR ONLY) (NEEDLE) ×2 IMPLANT
NS IRRIG 1000ML POUR BTL (IV SOLUTION) ×2 IMPLANT
PACK TOTAL JOINT (CUSTOM PROCEDURE TRAY) ×2 IMPLANT
PAD ARMBOARD 7.5X6 YLW CONV (MISCELLANEOUS) ×4 IMPLANT
SET HNDPC FAN SPRY TIP SCT (DISPOSABLE) ×1 IMPLANT
STRIP CLOSURE SKIN 1/2X4 (GAUZE/BANDAGES/DRESSINGS) ×2 IMPLANT
SUCTION FRAZIER HANDLE 10FR (MISCELLANEOUS) ×1
SUCTION TUBE FRAZIER 10FR DISP (MISCELLANEOUS) ×1 IMPLANT
SUT MNCRL AB 3-0 PS2 18 (SUTURE) ×2 IMPLANT
SUT VIC AB 0 CT1 27 (SUTURE) ×2
SUT VIC AB 0 CT1 27XBRD ANBCTR (SUTURE) ×2 IMPLANT
SUT VIC AB 1 CT1 27 (SUTURE) ×1
SUT VIC AB 1 CT1 27XBRD ANBCTR (SUTURE) ×1 IMPLANT
SUT VIC AB 2-0 CT1 27 (SUTURE) ×2
SUT VIC AB 2-0 CT1 TAPERPNT 27 (SUTURE) ×2 IMPLANT
SYR 30ML LL (SYRINGE) ×2 IMPLANT
TOWEL OR 17X24 6PK STRL BLUE (TOWEL DISPOSABLE) ×2 IMPLANT
TOWEL OR 17X26 10 PK STRL BLUE (TOWEL DISPOSABLE) ×2 IMPLANT
TRAY FOLEY CATH 16FR SILVER (SET/KITS/TRAYS/PACK) ×2 IMPLANT
TUBE CONNECTING 12X1/4 (SUCTIONS) ×2 IMPLANT
YANKAUER SUCT BULB TIP NO VENT (SUCTIONS) ×2 IMPLANT

## 2015-12-16 NOTE — Op Note (Signed)
MRN:     [id number] DOB/AGE:    68/22/1947 / 68 y.o.       OPERATIVE REPORT    DATE OF PROCEDURE:  12/16/2015       PREOPERATIVE DIAGNOSIS:   Primary localized OA right knee      Estimated body mass index is 24.4 kg/(m^2) as calculated from the following:   Height as of this encounter: 5' 3.75" (1.619 m).   Weight as of this encounter: 63.957 kg (141 lb).                                                        POSTOPERATIVE DIAGNOSIS:   same                                                                     PROCEDURE:  Procedure(s): TOTAL KNEE ARTHROPLASTY Using Depuy Attune RP implants #4 Femur, #5Tibia, 32mm  RP bearing, 29 Patella     SURGEON: Aasha Dina A    ASSISTANT:  Kirstin Shepperson PA-C   (Present and scrubbed throughout the case, critical for assistance with exposure, retraction, instrumentation, and closure.)         ANESTHESIA: Spinal with Adductor Nerve Block     TOURNIQUET TIME: [id number]   COMPLICATIONS:  None     SPECIMENS: None   INDICATIONS FOR PROCEDURE: The patient has  djd right knee, varus deformities, XR shows bone on bone arthritis. Patient has failed all conservative measures including anti-inflammatory medicines, narcotics, attempts at  exercise and weight loss, cortisone injections and viscosupplementation.  Risks and benefits of surgery have been discussed, questions answered.   DESCRIPTION OF PROCEDURE: The patient identified by armband, received  right femoral nerve block and IV antibiotics, in the holding area at [facility]. Patient taken to the operating room, appropriate anesthetic  monitors were attached General endotracheal anesthesia induced with  the patient in supine position, Foley catheter was inserted. Tourniquet  applied high to the operative thigh. Lateral post and foot positioner  applied to the table, the lower extremity was then prepped and draped  in usual sterile fashion from the ankle to the tourniquet. Time-out procedure  was performed. The limb was wrapped with an Esmarch bandage and the tourniquet inflated to 365 mmHg. We began the operation by making the anterior midline incision starting at handbreadth above the patella going over the patella 1 cm medial to and  4 cm distal to the tibial tubercle. Small bleeders in the skin and the  subcutaneous tissue identified and cauterized. Transverse retinaculum was incised and reflected medially and a medial parapatellar arthrotomy was accomplished. the patella was everted and theprepatellar fat pad resected. The superficial medial collateral  ligament was then elevated from anterior to posterior along the proximal  flare of the tibia and anterior half of the menisci resected. The knee was hyperflexed exposing bone on bone arthritis. Peripheral and notch osteophytes as well as the cruciate ligaments were then resected. We continued to  work our way around posteriorly along the proximal tibia, and externally  rotated the tibia subluxing it out  from underneath the femur. A McHale  retractor was placed through the notch and a lateral Hohmann retractor  placed, and we then drilled through the proximal tibia in line with the  axis of the tibia followed by an intramedullary guide rod and 2-degree  posterior slope cutting guide. The tibial cutting guide was pinned into place  allowing resection of 6 mm of bone medially and about 4 mm of bone  laterally because of her valgus deformity. Satisfied with the tibial resection, we then  entered the distal femur 2 mm anterior to the PCL origin with the  intramedullary guide rod and applied the distal femoral cutting guide  set at 43mm, with 5 degrees of valgus. This was pinned along the  epicondylar axis. At this point, the distal femoral cut was accomplished without difficulty. We then sized for a #4 femoral component and pinned the guide in 3 degrees of external rotation.The chamfer cutting guide was pinned into place. The anterior,  posterior, and chamfer cuts were accomplished without difficulty followed by  the  RP box cutting guide and the box cut. We also removed posterior osteophytes from the posterior femoral condyles. At this  time, the knee was brought into full extension. We checked our  extension and flexion gaps and found them symmetric at 37mm.  The patella thickness measured at 21 mm. We set the cutting guide at 13 and removed the posterior 8 mm  of the patella sized for 29 button and drilled the lollipop. The knee  was then once again hyperflexed exposing the proximal tibia. We sized for a #5 tibial base plate, applied the smokestack and the conical reamer followed by the the Delta fin keel punch. We then hammered into place the  RP trial femoral component, inserted a 1 trial bearing, trial patellar button, and took the knee through range of motion from 0-130 degrees. No thumb pressure was required for patellar  tracking. At this point, all trial components were removed, a double batch of DePuy HV cement  was mixed and applied to all bony metallic mating surfaces except for the posterior condyles of the femur itself. In order, we  hammered into place the tibial tray and removed excess cement, the femoral component and removed excess cement, a 27mm  RP bearing  was inserted, and the knee brought to full extension with compression.  The patellar button was clamped into place, and excess cement  removed. While the cement cured the wound was irrigated out with normal saline solution pulse lavage.. Ligament stability and patellar tracking were checked and found to be excellent.. The parapatellar arthrotomy was closed with  #1 Vicryl suture. The subcutaneous tissue with 0 and 2-0 undyed  Vicryl suture, and 4-0 Monocryl.. A dressing of Aquaseal,  4 x 4, dressing sponges, Webril, and Ace wrap applied. Needle and sponge count were correct times 2.The patient awakened, extubated, and taken to recovery room without difficulty.  Vascular status was normal, pulses 2+ and symmetric.   Elizabeth Stone A 12/16/2015, 11:38 AM

## 2015-12-16 NOTE — Anesthesia Procedure Notes (Addendum)
Anesthesia Regional Block:  Adductor canal block  Pre-Anesthetic Checklist: ,, timeout performed, Correct Patient, Correct Site, Correct Laterality, Correct Procedure, Correct Position, site marked, Risks and benefits discussed,  Surgical consent,  Pre-op evaluation,  At surgeon's request and post-op pain management  Laterality: Right  Prep: chloraprep       Needles:   Needle Type: Stimulator Needle - 80          Additional Needles:  Procedures: Doppler guided, ultrasound guided (picture in chart) and nerve stimulator Adductor canal block Narrative:  Start time: 12/16/2015 9:00 AM End time: 12/16/2015 9:15 AM Injection made incrementally with aspirations every 5 mL.  Performed by: Personally  Anesthesiologist: Finis Bud   Spinal Patient location during procedure: OR Start time: 12/16/2015 9:35 AM End time: 12/16/2015 9:45 AM Spinal Block Patient position: sitting Prep: Betadine Patient monitoring: heart rate, cardiac monitor, continuous pulse ox and blood pressure Approach: midline Location: L3-4 Injection technique: single-shot Needle Needle type: Quincke  Needle gauge: 22 G Assessment Sensory level: T10 Additional Notes Clear CSF. 11.25 spinal marcaine. Pt tolerated procedure well.

## 2015-12-16 NOTE — Anesthesia Preprocedure Evaluation (Addendum)
Anesthesia Evaluation  Patient identified by MRN, date of birth, ID band Patient awake    Reviewed: Allergy & Precautions, NPO status   Airway Mallampati: II  TM Distance: >3 FB Neck ROM: Full    Dental   Pulmonary former smoker,    breath sounds clear to auscultation       Cardiovascular hypertension,  Rhythm:Regular Rate:Normal     Neuro/Psych    GI/Hepatic Neg liver ROS, GERD  ,  Endo/Other    Renal/GU negative Renal ROS     Musculoskeletal  (+) Arthritis , Fibromyalgia -  Abdominal   Peds  Hematology   Anesthesia Other Findings   Reproductive/Obstetrics                            Anesthesia Physical Anesthesia Plan  ASA: III  Anesthesia Plan: Spinal and Regional   Post-op Pain Management: MAC Combined w/ Regional for Post-op pain   Induction: Intravenous  Airway Management Planned: Simple Face Mask  Additional Equipment:   Intra-op Plan:   Post-operative Plan:   Informed Consent: I have reviewed the patients History and Physical, chart, labs and discussed the procedure including the risks, benefits and alternatives for the proposed anesthesia with the patient or authorized representative who has indicated his/her understanding and acceptance.   Dental advisory given  Plan Discussed with: CRNA and Anesthesiologist  Anesthesia Plan Comments:        Anesthesia Quick Evaluation

## 2015-12-16 NOTE — Transfer of Care (Signed)
Immediate Anesthesia Transfer of Care Note  Patient: Elizabeth Stone  Procedure(s) Performed: Procedure(s): TOTAL KNEE ARTHROPLASTY (Right)  Patient Location: PACU  Anesthesia Type:MAC combined with regional for post-op pain  Level of Consciousness: awake, alert  and oriented  Airway & Oxygen Therapy: Patient Spontanous Breathing and Patient connected to nasal cannula oxygen  Post-op Assessment: Report given to RN and Post -op Vital signs reviewed and stable  Post vital signs: Reviewed and stable  Last Vitals:  Filed Vitals:   12/16/15 0910 12/16/15 0920  BP: 154/65 140/61  Pulse: 75 80  Temp:    Resp: 18 18    Complications: No apparent anesthesia complications

## 2015-12-16 NOTE — Progress Notes (Signed)
Orthopedic Tech Progress Note Patient Details:  Elizabeth Stone 03-Apr-1946 [id number]  CPM Right Knee CPM Right Knee: On Right Knee Flexion (Degrees): 90 Right Knee Extension (Degrees): 0 Additional Comments: trapweze bar pAtient helper Viewed order from doctor's order list  [name] 12/16/2015, 2:33 PM

## 2015-12-16 NOTE — Interval H&P Note (Signed)
History and Physical Interval Note:  12/16/2015 7:03 AM  Elizabeth Stone  has presented today for surgery, with the diagnosis of Primary localized OA right knee  The various methods of treatment have been discussed with the patient and family. After consideration of risks, benefits and other options for treatment, the patient has consented to  Procedure(s): TOTAL KNEE ARTHROPLASTY (Right) as a surgical intervention .  The patient's history has been reviewed, patient examined, no change in status, stable for surgery.  I have reviewed the patient's chart and labs.  Questions were answered to the patient's satisfaction.     [name]

## 2015-12-16 NOTE — Anesthesia Postprocedure Evaluation (Signed)
Anesthesia Post Note  Patient: Elizabeth Stone  Procedure(s) Performed: Procedure(s) (LRB): TOTAL KNEE ARTHROPLASTY (Right)  Patient location during evaluation: PACU Anesthesia Type: Spinal and Regional Level of consciousness: awake Pain management: pain level controlled Respiratory status: spontaneous breathing Cardiovascular status: stable Anesthetic complications: no    Last Vitals:  Filed Vitals:   12/16/15 1235 12/16/15 1330  BP: 123/64 108/68  Pulse: 76   Temp:    Resp: 13     Last Pain:  Filed Vitals:   12/16/15 1341  PainSc: 8                  EDWARDS,Raiquan Chandler

## 2015-12-16 NOTE — Evaluation (Addendum)
Physical Therapy Evaluation Patient Details Name: Elizabeth Stone MRN: [id number] DOB: 12-Mar-1946 Today's Date: 12/16/2015   History of Present Illness  70 y.o. female admitted to [facility] on 12/15/14 for elective R TKA.  Pt with significant PMHx of bil cochlear implants.  Meniere's Disease, fibromyalgia, HTN, bipolar d/o, R reverse TSA, and hammer toe surgery.   Clinical Impression  Pt is POD #0 and was able to get OOB to the recliner chair walking with the RW and up to mod assist.  Her hearing impairment creates a challenge as she hears best when she can see/read your lips.  So, gait cues have to be given before and after gait as well as significant tactile and manual cues.  Pt is appropriate for SNF for rehab at discharge.   PT to follow acutely for deficits listed below.      Follow Up Recommendations SNF    Equipment Recommendations  None recommended by PT    Recommendations for Other Services   NA    Precautions / Restrictions Precautions Precautions: Fall;Knee Precaution Booklet Issued: Yes (comment) Precaution Comments: knee exercise handout given Required Braces or Orthoses: Knee Immobilizer - Right Restrictions Weight Bearing Restrictions: Yes RLE Weight Bearing: Weight bearing as tolerated      Mobility  Bed Mobility Overal bed mobility: Modified Independent             General bed mobility comments: Pt able to move EOB with extra time given and [facility] elevated/railing for leverage  Transfers Overall transfer level: Needs assistance Equipment used: Rolling walker (2 wheeled) Transfers: Sit to/from Stand Sit to Stand: Min assist         General transfer comment: Min assist to support trunk during transitions to stand.  Verbal cues for safe hand placement.   Ambulation/Gait Ambulation/Gait assistance: Mod assist Ambulation Distance (Feet): 8 Feet Assistive device: Rolling walker (2 wheeled) Gait Pattern/deviations: Step-to pattern;Antalgic     General Gait  Details: Pt is very quick to move and cannot hear verbal cues during gait.  She has to be able to see your face to get/understand cues.            Balance Overall balance assessment: Needs assistance Sitting-balance support: Feet supported;No upper extremity supported Sitting balance-Leahy Scale: Good     Standing balance support: Bilateral upper extremity supported Standing balance-Leahy Scale: Poor                               Pertinent Vitals/Pain Pain Assessment: 0-10 Pain Score: 8  Pain Location: right knee Pain Descriptors / Indicators: Aching;Burning Pain Intervention(s): Limited activity within patient's tolerance;Monitored during session;Repositioned    Home Living Family/patient expects to be discharged to:: Skilled nursing facility ([facility]) Living Arrangements: Alone Available Help at Discharge: Family;Available PRN/intermittently Type of Home: House Home Access: Stairs to enter Entrance Stairs-Rails: Right (starts on the right, switches to the left) Entrance Stairs-Number of Steps: 9 Home Layout: One level Home Equipment: Walker - 2 wheels;Grab bars - tub/shower;Shower seat;Other (comment) (has an adjustable bed, no rails)      Prior Function Level of Independence: Independent with assistive device(s)         Comments: drives, would love to get back to tennis and aerobics        Extremity/Trunk Assessment   Upper Extremity Assessment: RUE deficits/detail RUE Deficits / Details: right arm with h/o reverse TSA         Lower  Extremity Assessment: RLE deficits/detail RLE Deficits / Details: right leg with normal post op pain and weakness.  Pt with at least 3/5 ankle, 2/5 knee, 3-/5 hip flexion    Cervical / Trunk Assessment: Normal  Communication   Communication: Deaf (has cochlear implants)  Cognition Arousal/Alertness: Awake/alert Behavior During Therapy: WFL for tasks assessed/performed Overall Cognitive Status: Within  Functional Limits for tasks assessed                         Exercises Total Joint Exercises Ankle Circles/Pumps: AROM;Both;10 reps      Assessment/Plan    PT Assessment Patient needs continued PT services  PT Diagnosis Difficulty walking;Abnormality of gait;Generalized weakness;Acute pain   PT Problem List Decreased strength;Decreased range of motion;Decreased activity tolerance;Decreased balance;Decreased mobility;Decreased knowledge of use of DME;Decreased knowledge of precautions;Pain  PT Treatment Interventions DME instruction;Gait training;Stair training;Therapeutic activities;Functional mobility training;Therapeutic exercise;Balance training;Neuromuscular re-education;Patient/family education;Manual techniques;Modalities   PT Goals (Current goals can be found in the Care Plan section) Acute Rehab PT Goals Patient Stated Goal: to get back to an active lifestyle/working out.  PT Goal Formulation: With patient Time For Goal Achievement: 12/23/15 Potential to Achieve Goals: Good    Frequency 7X/week           End of Session Equipment Utilized During Treatment: Right knee immobilizer Activity Tolerance: Patient limited by pain Patient left: in chair;with call bell/phone within reach;with family/visitor present Nurse Communication: Mobility status         Time: [id number] PT Time Calculation (min) (ACUTE ONLY): 32 min   Charges:   PT Evaluation $PT Eval Moderate Complexity: 1 Procedure PT Treatments $Therapeutic Activity: 8-22 mins        Jameica Couts B. [place], [place], DPT [phone]   12/16/2015, 6:08 PM

## 2015-12-17 ENCOUNTER — Encounter (HOSPITAL_COMMUNITY): Payer: Self-pay | Admitting: Orthopedic Surgery

## 2015-12-17 LAB — BASIC METABOLIC PANEL
ANION GAP: 8 (ref 5–15)
BUN: 12 mg/dL (ref 6–20)
CALCIUM: 9.3 mg/dL (ref 8.9–10.3)
CO2: 25 mmol/L (ref 22–32)
Chloride: 104 mmol/L (ref 101–111)
Creatinine, Ser: 0.66 mg/dL (ref 0.44–1.00)
Glucose, Bld: 162 mg/dL — ABNORMAL HIGH (ref 65–99)
Potassium: 4 mmol/L (ref 3.5–5.1)
Sodium: 137 mmol/L (ref 135–145)

## 2015-12-17 LAB — CBC
HEMATOCRIT: 33.6 % — AB (ref 36.0–46.0)
Hemoglobin: 11.7 g/dL — ABNORMAL LOW (ref 12.0–15.0)
MCH: 33.4 pg (ref 26.0–34.0)
MCHC: 34.8 g/dL (ref 30.0–36.0)
MCV: 96 fL (ref 78.0–100.0)
PLATELETS: 202 10*3/uL (ref 150–400)
RBC: 3.5 MIL/uL — ABNORMAL LOW (ref 3.87–5.11)
RDW: 12.5 % (ref 11.5–15.5)
WBC: 16.1 10*3/uL — AB (ref 4.0–10.5)

## 2015-12-17 MED ORDER — CLONAZEPAM 0.5 MG PO TABS
0.2500 mg | ORAL_TABLET | Freq: Three times a day (TID) | ORAL | Status: DC | PRN
  Administered 2015-12-17 – 2015-12-19 (×4): 0.5 mg via ORAL
  Filled 2015-12-17 (×4): qty 1

## 2015-12-17 NOTE — Clinical Social Work Note (Signed)
Clinical Social Work Assessment  Patient Details  Name: Elizabeth Stone MRN: [id number] Date of Birth: [birth year]  Date of referral:  12/17/15               Reason for consult:  Facility Placement                Permission sought to share information with:  Facility Sport and exercise psychologist, Family Supports Permission granted to share information::  Yes, Verbal Permission Granted  Name::     Emeline Gins [phone] or [phone], or   Agency::  SNF admissions  Relationship::     Contact Information:     Housing/Transportation Living arrangements for the past 2 months:  Single Family Home Source of Information:  Patient Patient Interpreter Needed:  None Criminal Activity/Legal Involvement Pertinent to Current Situation/Hospitalization:  No - Comment as needed Significant Relationships:  Adult Children Lives with:  Self Do you feel safe going back to the place where you live?  No (Patient feel she needs some short term rehab before she is able to return back home.) Need for family participation in patient care:  No (Coment)  Care giving concerns:  Patient feels she needs some short term rehab before she is able to return back home.   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.  Patient states her husband passed away last year, since then she has been living by herself but she has been doing well because of her strong support network.  Patient expressed that she has a strong friends, family, and neighbor support.  Patient expressed that she has been to rehab in the past for her shoulder, but has not had rehab for her knee. CSW explained to patient role of CSW and process of bed placement.  CSW explained to patient what to expect at SNF and how the SNF works on discharge planning.  Patient expressed that she is not worried or concerned about going to SNF for short term rehab.  Patient  asked questions regarding insurance and how SNF is paid for, CSW explained process to patient.  Patient expressed she did not have any other questions or concerns.  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  Patient/Family's Understanding of and Emotional Response to Diagnosis, Current Treatment, and Prognosis:  Patient expressed she is looking forward to working hard to get back home.  Patient is aware of her current treatment and prognosis.  Emotional Assessment Appearance:  Appears stated age Attitude/Demeanor/Rapport:    Affect (typically observed):  Calm, Pleasant, Appropriate Orientation:  Oriented to Self, Oriented to Place, Oriented to  Time, Oriented to Situation Alcohol / Substance use:  Not Applicable Psych involvement (Current and /or in the community):  No (Comment)  Discharge Needs  Concerns to be addressed:  No discharge needs identified Readmission within the last 30 days:  No Current discharge risk:  Lives alone Barriers to Discharge:  No Barriers Identified   Anell Barr 12/17/2015, 5:43 PM

## 2015-12-17 NOTE — Clinical Social Work Note (Signed)
CSW spoke with patient regarding SNF placements and she is preregistered at [facility] for short term rehab.  Patient is a Level 2 Passar manual screen, CSW waiting for Passar number, 30 day form on patient's chart awaiting signature by physician.  Once Passar numbter has been received, patient can discharge to [facility] when she is medically ready for discharge and orders have been received.  Quiles Broom. [place], MSW, [place] 12/17/2015 5:57 PM

## 2015-12-17 NOTE — Progress Notes (Signed)
Physical Therapy Treatment Patient Details Name: Elizabeth Stone MRN: [id number] DOB: 14-Sep-1946 Today's Date: 12/17/2015    History of Present Illness 70 y.o. female admitted to [facility] on 12/15/14 for elective R TKA.  Pt with significant PMHx of bil cochlear implants.  Meniere's Disease, fibromyalgia, HTN, bipolar d/o, R reverse TSA, and hammer toe surgery.     PT Comments    Progressing well. Taught pt new exercises which pt completed with good quad activation and minimal extensor lag. Pt tolerated ambulation trial and demonstrated increased independence with bed mobility and transfers. Pt continues to need verbal cues for safe transitions. Recommendation to SNF for continued rehab once D/C'd from hospital remains appropriate.  Follow Up Recommendations  SNF     Equipment Recommendations  None recommended by PT    Recommendations for Other Services       Precautions / Restrictions Precautions Precautions: Knee;Fall Precaution Booklet Issued: No Precaution Comments: reviewed precuations, went over exercises on page 2 Required Braces or Orthoses: Knee Immobilizer - Right Restrictions Weight Bearing Restrictions: Yes RLE Weight Bearing: Weight bearing as tolerated    Mobility  Bed Mobility Overal bed mobility: Needs Assistance Bed Mobility: Supine to Sit     Supine to sit: Supervision Sit to supine: Min assist   General bed mobility comments: Pt able to move EOB with increased time and HOB slightly elevated  Transfers Overall transfer level: Needs assistance Equipment used: Rolling walker (2 wheeled) Transfers: Sit to/from Stand Sit to Stand: Min guard;Min assist         General transfer comment: Pt rose from bed without assistance on 2nd attempt after cues to push from up from bed rail and to lean forward, pt cued to kick R knee out when sitting down for comfort and pt demonstrated increased tolerance with stand to sit  Ambulation/Gait Ambulation/Gait assistance: Min  guard;Min assist Ambulation Distance (Feet): 60 Feet Assistive device: Rolling walker (2 wheeled) Gait Pattern/deviations: Step-to pattern;Antalgic;Decreased step length - left Gait velocity: decreased Gait velocity interpretation: Below normal speed for age/gender General Gait Details: cueing for breathing as well as pushing the RW a little further out, minimal A for manual forward progression of walker   Stairs            Wheelchair Mobility    Modified Rankin (Stroke Patients Only)       Balance Overall balance assessment: Needs assistance Sitting-balance support: No upper extremity supported;Feet supported Sitting balance-Leahy Scale: Good     Standing balance support: Bilateral upper extremity supported;During functional activity Standing balance-Leahy Scale: Fair Standing balance comment: Pt stood within RW w/o hands to clear off bedside table for dinner                    Cognition Arousal/Alertness: Awake/alert Behavior During Therapy: [facility] for tasks assessed/performed Overall Cognitive Status: Within Functional Limits for tasks assessed                      Exercises Total Joint Exercises Ankle Circles/Pumps: AROM;Both;20 reps Quad Sets: AROM;Right;10 reps Short Arc Quad: AROM;Right;10 reps;Supine Hip ABduction/ADduction: AROM;Right;10 reps;Supine Straight Leg Raises: AROM;Right;10 reps;Supine (good quad contraction minimal extensor lag)    General Comments        Pertinent Vitals/Pain Pain Assessment: 0-10 Pain Score: 4  Pain Location: R knee Pain Descriptors / Indicators: Aching Pain Intervention(s): Monitored during session;Limited activity within patient's tolerance    Home Living Family/patient expects to be discharged to:: [name]  Arrangements: Alone Available Help at Discharge: Family;Available PRN/intermittently Type of Home: House Home Access: Stairs to enter Entrance Stairs-Rails: Right (starts  on R and switches to L) Home Layout: One level Home Equipment: Walker - 2 wheels;Grab bars - tub/shower;Shower seat;Other (comment) (adjustable bed)      Prior Function Level of Independence: Independent with assistive device(s)      Comments: drives, would love to get back to tennis and aerobics   PT Goals (current goals can now be found in the care plan section) Acute Rehab PT Goals Patient Stated Goal: get back on the bicycle Progress towards PT goals: Progressing toward goals    Frequency  7X/week    PT Plan Current plan remains appropriate    Co-evaluation             End of Session Equipment Utilized During Treatment: Gait belt;Right knee immobilizer Activity Tolerance: Patient limited by pain Patient left: in chair;with call bell/phone within reach     Time: [id number] PT Time Calculation (min) (ACUTE ONLY): 24 min  Charges:  $Gait Training: 8-22 mins $Therapeutic Activity: 8-22 mins                    G Codes:      Ara Kussmaul 12-20-15, 5:39 PM Ara Kussmaul, Student Physical Therapist Acute Rehab [phone]

## 2015-12-17 NOTE — Care Management Note (Signed)
Case Management Note  Patient Details  Name: Elizabeth Stone MRN: [id number] Date of Birth: June 26, 1946  Subjective/Objective:       S/p right total knee arthroplasty             Action/Plan: PT recommended SNF. Referral made to CSW, [place] working on SNF placement for short term rehab. Will continue to follow.  Expected Discharge Date:                  Expected Discharge Plan:  Skilled Nursing Facility  In-House Referral:  Clinical Social Work  Discharge planning Services  CM Consult  Post Acute Care Choice:  NA Choice offered to:     DME Arranged:  N/A DME Agency:     HH Arranged:  NA HH Agency:     Status of Service:  In process, will continue to follow  Medicare Important Message Given:    Date Medicare IM Given:    Medicare IM give by:    Date Additional Medicare IM Given:    Additional Medicare Important Message give by:     If discussed at [place] of Stay Meetings, dates discussed:    Additional Comments:  Dilara, Wyllie, RN 12/17/2015, 4:15 PM

## 2015-12-17 NOTE — Clinical Social Work Placement (Addendum)
   CLINICAL SOCIAL WORK PLACEMENT  NOTE  Date:  12/17/2015  Patient Details  Name: Elizabeth Stone MRN: [id number] Date of Birth: 15-Nov-1945  Clinical Social Work is seeking post-discharge placement for this patient at the [place] level of care (*CSW will initial, date and re-position this form in  chart as items are completed):  Yes   Patient/family provided with [place] Work Department's list of facilities offering this level of care within the geographic area requested by the patient (or if unable, by the patient's family).  Yes   Patient/family informed of their freedom to choose among providers that offer the needed level of care, that participate in Medicare, Medicaid or managed care program needed by the patient, have an available bed and are willing to accept the patient.  Yes   Patient/family informed of [employer]'s ownership interest in [facility] and [facility], as well as of the fact that they are under no obligation to receive care at these facilities.  PASRR submitted to EDS on 12/17/15     PASRR number received on       Existing PASRR number confirmed on       FL2 transmitted to all facilities in geographic area requested by pt/family on 12/17/15     FL2 transmitted to all facilities within larger geographic area on       Patient informed that his/her managed care company has contracts with or will negotiate with certain facilities, including the following:        Yes   Patient/family informed of bed offers received.  Patient chooses bed at [facility]     Physician recommends and patient chooses bed at      Patient to be transferred to [facility] on  12/19/15.  Patient to be transferred to facility by  patient's family car.     Patient family notified on  12-19-15 of transfer.  Name of family member notified:   Clarise Cruz     PHYSICIAN Please sign FL2     Additional Comment:     _______________________________________________ Ross Ludwig, LCSWA 12/17/2015, 5:52 PM

## 2015-12-17 NOTE — Progress Notes (Signed)
Physical Therapy Treatment Patient Details Name: Elizabeth Stone MRN: [id number] DOB: 1946/09/05 Today's Date: 12/17/2015    History of Present Illness 70 y.o. female admitted to [facility] on 12/15/14 for elective R TKA.  Pt with significant PMHx of bil cochlear implants.  Meniere's Disease, fibromyalgia, HTN, bipolar d/o, R reverse TSA, and hammer toe surgery.     PT Comments    Pt is POD and is progressing with her mobility.  She is quite a bit more anxious today, however, RN reports she has one of her medications that should help with that now.  Pt was able to ambulate into the hallway with chair to follow (but not needed) to help encourage increased gait distance.  Pt is still appropriate for d/c to SNF.  PT will follow acutely.   Follow Up Recommendations  SNF ([facility])     Equipment Recommendations  None recommended by PT    Recommendations for Other Services    NA     Precautions / Restrictions Precautions Precautions: Fall;Knee Precaution Booklet Issued: Yes (comment) Precaution Comments: knee exercise handout given, knee precautions reviewed Required Braces or Orthoses: Knee Immobilizer - Right Restrictions RLE Weight Bearing: Weight bearing as tolerated    Mobility  Bed Mobility               General bed mobility comments: Pt OOB in the chair  Transfers Overall transfer level: Needs assistance Equipment used: Rolling walker (2 wheeled) Transfers: Sit to/from Stand Sit to Stand: Min assist         General transfer comment: Min assis to support trunk and stabilize RW during transitions.  Verbal cues for safe hand placement and RW positioning when going to sit.  Uncontrolled descent to sit.   Ambulation/Gait Ambulation/Gait assistance: Min assist;+2 safety/equipment Ambulation Distance (Feet): 65 Feet Assistive device: Rolling walker (2 wheeled) Gait Pattern/deviations: Step-to pattern;Antalgic Gait velocity: decreased Gait velocity interpretation: Below  normal speed for age/gender General Gait Details: Pt very shaky on her feet.  She needs cues to breathe throughout gait.  Verbal and visual demonstration before she got up of correct LE sequencing.  Chair to follow for safety, but not needed.        Balance Overall balance assessment: Needs assistance Sitting-balance support: Feet supported;No upper extremity supported Sitting balance-Leahy Scale: Good     Standing balance support: Bilateral upper extremity supported Standing balance-Leahy Scale: Poor                      Cognition Arousal/Alertness: Awake/alert Behavior During Therapy: Anxious Overall Cognitive Status: Within Functional Limits for tasks assessed                      Exercises Total Joint Exercises Ankle Circles/Pumps: AROM;Both;20 reps Quad Sets: AROM;Right;10 reps Towel Squeeze: AROM;Both;10 reps Heel Slides: AAROM;Right;10 reps        Pertinent Vitals/Pain Pain Assessment: 0-10 Pain Score: 8  Pain Location: right knee Pain Descriptors / Indicators: Aching;Burning Pain Intervention(s): Monitored during session;Limited activity within patient's tolerance;Premedicated before session;Repositioned           PT Goals (current goals can now be found in the care plan section) Acute Rehab PT Goals Patient Stated Goal: to get back to an active lifestyle/working out.  Progress towards PT goals: Progressing toward goals    Frequency  7X/week    PT Plan Current plan remains appropriate       End of Session Equipment Utilized During Treatment:  Gait belt;Right knee immobilizer Activity Tolerance: Patient limited by pain Patient left: in chair;with call bell/phone within reach;with family/visitor present     Time: [id number] PT Time Calculation (min) (ACUTE ONLY): 24 min  Charges:  $Gait Training: 8-22 mins $Therapeutic Exercise: 8-22 mins            Gladine Plude B. [place], [place], DPT [phone]   12/17/2015, 11:49 AM

## 2015-12-17 NOTE — Progress Notes (Signed)
Occupational Therapy Evaluation Patient Details Name: Elizabeth Stone MRN: [id number] DOB: 1946-02-01 Today's Date: 12/17/2015    History of Present Illness 70 y.o. female admitted to [facility] on 12/15/14 for elective R TKA.  Pt with significant PMHx of bil cochlear implants.  Meniere's Disease, fibromyalgia, HTN, bipolar d/o, R reverse TSA, and hammer toe surgery.    Clinical Impression   PTA, pt was independent with ADLs and mobility. Pt continues to demonstrate some anxious behavior and currently requires min-mod assist for LB ADLs and functional transfers with mod verbal cues for safety. Pt had 1 LOB upon standing that required mod assist from therapist for pt to regain balance safely. Recommend SNF for post-acute rehab stay to maximize independence and safety with ADLs and mobility before discharging home due to pt's functional limitations and lack of assistance at home at this time. Will continue to follow acutely.    Follow Up Recommendations  SNF;Supervision/Assistance - 24 hour    Equipment Recommendations  Other (comment) (TBD in next venue)    Recommendations for Other Services       Precautions / Restrictions Precautions Precautions: Fall;Knee Precaution Booklet Issued: No Precaution Comments: Reviewed not placing pillow, ice pack, or other object underneath R knee Required Braces or Orthoses: Knee Immobilizer - Right Restrictions Weight Bearing Restrictions: Yes RLE Weight Bearing: Weight bearing as tolerated      Mobility Bed Mobility Overal bed mobility: Needs Assistance Bed Mobility: Sit to Supine       Sit to supine: Min assist   General bed mobility comments: Min assist to elevate RLE onto bed. HOB flat, use of bedrails  Transfers Overall transfer level: Needs assistance Equipment used: Rolling walker (2 wheeled) Transfers: Sit to/from Stand Sit to Stand: Min assist         General transfer comment: Min assist for boost to stand and to stabilize balance  upon standing. Cues for safe hand placement and to control descent onto surfaces. Pt still "flopping" down onto surfaces which causes her severe knee pain.     Balance Overall balance assessment: Needs assistance Sitting-balance support: No upper extremity supported;Feet supported Sitting balance-Leahy Scale: Good     Standing balance support: Bilateral upper extremity supported;During functional activity Standing balance-Leahy Scale: Poor Standing balance comment: Pt had 1 LOB upon initial sit<>stand transfer that required mod assist to regain balance. Advised pt to wait a few moments to adjust being upright before taking steps.                            ADL Overall ADL's : Needs assistance/impaired     Grooming: Wash/dry hands;Oral care;Min guard;Standing           Upper Body Dressing : Set up;Sitting   Lower Body Dressing: Moderate assistance;Cueing for compensatory techniques;Sit to/from stand Lower Body Dressing Details (indicate cue type and reason): Unable to reach bilateral feet, cues to dress RLE first and undress it last Toilet Transfer: Minimal assistance;Ambulation;BSC;RW;Cueing for safety Toilet Transfer Details (indicate cue type and reason): BSC over toilet, cues to feel BSC on back of legs before reaching back to sit Toileting- Clothing Manipulation and Hygiene: Minimal assistance;Sit to/from stand Toileting - Clothing Manipulation Details (indicate cue type and reason): Assist to maintain balance while removing clothing     Functional mobility during ADLs: Minimal assistance;Rolling walker General ADL Comments: Educated pt on knee precautions, use of CPM and bone foam, pain/edema management, and began education on compensatory strategies  for ADLs.     Vision Vision Assessment?: No apparent visual deficits   Perception     Praxis      Pertinent Vitals/Pain Pain Assessment: 0-10 Pain Score: 6  Pain Location: R knee Pain Descriptors /  Indicators: Aching Pain Intervention(s): Limited activity within patient's tolerance;Monitored during session;Repositioned;Premedicated before session     Hand Dominance Right   Extremity/Trunk Assessment Upper Extremity Assessment Upper Extremity Assessment: RUE deficits/detail RUE Deficits / Details: right arm with h/o reverse TSA   Lower Extremity Assessment Lower Extremity Assessment: RLE deficits/detail RLE Deficits / Details: decreased ROM and strength as expected post op   Cervical / Trunk Assessment Cervical / Trunk Assessment: Normal   Communication Communication Communication: Deaf (has cochlear implants)   Cognition Arousal/Alertness: Awake/alert Behavior During Therapy: Anxious Overall Cognitive Status: Within Functional Limits for tasks assessed                     General Comments       Exercises       Shoulder Instructions      Home Living Family/patient expects to be discharged to:: Skilled nursing facility Living Arrangements: Alone Available Help at Discharge: Family;Available PRN/intermittently Type of Home: House Home Access: Stairs to enter [employer] of Steps: 9 Entrance Stairs-Rails: Right (starts on R and switches to L) Home Layout: One level     Bathroom Shower/Tub: Tub/shower unit;Curtain Shower/tub characteristics: Architectural technologist: Standard Bathroom Accessibility: No   Home Equipment: Environmental consultant - 2 wheels;Grab bars - tub/shower;Shower seat;Other (comment) (adjustable bed)          Prior Functioning/Environment Level of Independence: Independent with assistive device(s)        Comments: drives, would love to get back to tennis and aerobics    OT Diagnosis: Generalized weakness;Acute pain   OT Problem List: Decreased strength;Decreased activity tolerance;Decreased range of motion;Impaired balance (sitting and/or standing);Decreased coordination;Decreased safety awareness;Decreased knowledge of use of  DME or AE;Decreased knowledge of precautions;Obesity;Pain   OT Treatment/Interventions: Self-care/ADL training;Therapeutic exercise;Energy conservation;DME and/or AE instruction;Therapeutic activities;Patient/family education;Balance training    OT Goals(Current goals can be found in the care plan section) Acute Rehab OT Goals Patient Stated Goal: to get back to an active lifestyle/working out.  OT Goal Formulation: With patient Potential to Achieve Goals: Good ADL Goals Pt Will Perform Lower Body Bathing: with min guard assist;with adaptive equipment;sitting/lateral leans;sit to/from stand Pt Will Perform Lower Body Dressing: with min guard assist;with adaptive equipment;sitting/lateral leans;sit to/from stand Pt Will Transfer to Toilet: with min guard assist;ambulating;bedside commode Pt Will Perform Toileting - Clothing Manipulation and hygiene: with min guard assist;sitting/lateral leans;sit to/from stand  OT Frequency: Min 2X/week   Barriers to D/C: Decreased caregiver support  Lives alone and son is only available intermittently       Co-evaluation              End of Session Equipment Utilized During Treatment: Gait belt;Rolling walker;Right knee immobilizer CPM Right Knee CPM Right Knee: On Right Knee Flexion (Degrees): 55 Right Knee Extension (Degrees): 0 Additional Comments: PT could not tolerate 70 degrees Nurse Communication: Mobility status  Activity Tolerance: Patient tolerated treatment well Patient left: in bed;with call bell/phone within reach;with nursing/sitter in room   Time: 1341-1423 OT Time Calculation (min): 42 min Charges:  OT General Charges $OT Visit: 1 Procedure OT Evaluation $OT Eval Moderate Complexity: 1 Procedure OT Treatments $Self Care/Home Management : 23-37 mins G-Codes:    Redmond Baseman, OTR/L Pager: [phone] 12/17/2015, 3:54  PM

## 2015-12-17 NOTE — NC FL2 (Signed)
[place] LEVEL OF CARE SCREENING TOOL     IDENTIFICATION  Patient Name: Elizabeth Stone Birthdate: 09-13-1946 Sex: female Admission Date (Current Location): 12/16/2015  [facility] [place] Number:  Elizabeth Stone  [phone]) Facility and Address:  [facility], [place] [address] [place], [place] [address]      Provider Number: [id number]  Attending Physician Name and Address:  Elsie Saas, MD  Relative Name and Phone Number:   Jaynie Collins- son, [phone])    Current Level of Care: Hospital Recommended Level of Care: [place] Prior Approval Number:    Date Approved/Denied:   PASRR Number:   [id number]  Discharge Plan: SNF    Current Diagnoses: Patient Active Problem List   Diagnosis Date Noted  . Primary localized osteoarthritis of right knee   . Rotator cuff tear arthropathy 12/13/2014  . Ulcer of right second toe ([place]) 12/21/2013  . Family history of ischemic heart disease 09/27/2013  . Essential hypertension, benign 09/27/2013  . Mixed hyperlipidemia 09/27/2013  . Arthritis   . Anxiety   . Depression   . GERD (gastroesophageal reflux disease)   . Meniere disease   . Osteoporosis   . Hearing impaired     Orientation RESPIRATION BLADDER Height & Weight     Self, Time, Situation, Place  Normal Continent Weight: 141 lb (63.957 kg) Height:  5' 3.75" (161.9 cm)  BEHAVIORAL SYMPTOMS/MOOD NEUROLOGICAL BOWEL NUTRITION STATUS      Continent Diet (REGULAR)  AMBULATORY STATUS COMMUNICATION OF NEEDS Skin   Extensive Assist   Surgical wounds (incision location: right knee/right shoulder)                       Personal Care Assistance Level of Assistance              Functional Limitations Info             SPECIAL CARE FACTORS FREQUENCY  PT (By licensed PT)     PT Frequency:  (7x/week)              Contractures      Additional Factors Info  Code Status, Allergies Code Status Info:   (PRIOR) Allergies Info:  (latex)           Current Medications (12/17/2015):  This is the current hospital active medication list Current Facility-Administered Medications  Medication Dose Route Frequency Provider Last Rate Last Dose  . 0.9 % NaCl with KCl 20 mEq/ L  infusion   Intravenous Continuous Kirstin Shepperson, PA-C 100 mL/hr at 12/17/15 0209    . acetaminophen (TYLENOL) tablet 650 mg  650 mg Oral Q6H PRN Kirstin Shepperson, PA-C       Or  . acetaminophen (TYLENOL) suppository 650 mg  650 mg Rectal Q6H PRN Kirstin Shepperson, PA-C      . alum & mag hydroxide-simeth (MAALOX/MYLANTA) 200-200-20 MG/5ML suspension 30 mL  30 mL Oral Q4H PRN Kirstin Shepperson, PA-C      . Asenapine Maleate SUBL 10 mg  10 mg Oral QHS Kirstin Shepperson, PA-C   10 mg at 12/16/15 2200  . aspirin EC tablet 325 mg  325 mg Oral Q breakfast Kirstin Shepperson, PA-C   325 mg at 12/17/15 0836  . atorvastatin (LIPITOR) tablet 10 mg  10 mg Oral q1800 Kirstin Shepperson, PA-C   10 mg at 12/16/15 1816  . buPROPion (WELLBUTRIN XL) 24 hr tablet 300 mg  300 mg Oral Daily Kirstin Shepperson, PA-C  300 mg at 12/17/15 0921  . celecoxib (CELEBREX) capsule 200 mg  200 mg Oral Q12H Kirstin Shepperson, PA-C   200 mg at 12/17/15 [id number]  . cholecalciferol (VITAMIN D) tablet 1,000 Units  1,000 Units Oral Daily Kirstin Shepperson, PA-C   1,000 Units at 12/17/15 [id number]  . clonazePAM (KLONOPIN) tablet 0.25-0.5 mg  0.25-0.5 mg Oral TID PRN Kirstin Shepperson, PA-C      . dexamethasone (DECADRON) injection 10 mg  10 mg Intravenous Q8H Kirstin Shepperson, PA-C   10 mg at 12/17/15 [id number]  . diphenhydrAMINE (BENADRYL) 12.5 MG/5ML elixir 12.5-25 mg  12.5-25 mg Oral Q4H PRN Kirstin Shepperson, PA-C   25 mg at 12/17/15 0209  . docusate sodium (COLACE) capsule 100 mg  100 mg Oral BID Kirstin Shepperson, PA-C   100 mg at 12/17/15 [id number]  . DULoxetine (CYMBALTA) DR capsule 60 mg  60 mg Oral BID Kirstin Shepperson, PA-C   60 mg at 12/17/15 0921  .  HYDROmorphone (DILAUDID) injection 1 mg  1 mg Intravenous Q2H PRN Kirstin Shepperson, PA-C   1 mg at 12/17/15 [id number]  . lamoTRIgine (LAMICTAL) tablet 200 mg  200 mg Oral Daily Kirstin Shepperson, PA-C   200 mg at 12/17/15 [id number]  . menthol-cetylpyridinium (CEPACOL) lozenge 3 mg  1 lozenge Oral PRN Kirstin Shepperson, PA-C       Or  . phenol (CHLORASEPTIC) mouth spray 1 spray  1 spray Mouth/Throat PRN Kirstin Shepperson, PA-C      . metoCLOPramide (REGLAN) tablet 5-10 mg  5-10 mg Oral Q8H PRN Kirstin Shepperson, PA-C       Or  . metoCLOPramide (REGLAN) injection 5-10 mg  5-10 mg Intravenous Q8H PRN Kirstin Shepperson, PA-C      . montelukast (SINGULAIR) tablet 10 mg  10 mg Oral QHS Kirstin Shepperson, PA-C   10 mg at 12/16/15 2220  . ondansetron (ZOFRAN) tablet 4 mg  4 mg Oral Q6H PRN Kirstin Shepperson, PA-C       Or  . ondansetron (ZOFRAN) injection 4 mg  4 mg Intravenous Q6H PRN Kirstin Shepperson, PA-C      . oxyCODONE (Oxy IR/ROXICODONE) immediate release tablet 5-10 mg  5-10 mg Oral Q3H PRN Kirstin Shepperson, PA-C   10 mg at 12/17/15 0836  . polyethylene glycol (MIRALAX / GLYCOLAX) packet 17 g  17 g Oral BID Kirstin Shepperson, PA-C   17 g at 12/17/15 [id number]  . vitamin C (ASCORBIC ACID) tablet 500 mg  500 mg Oral BID Kirstin Shepperson, PA-C   500 mg at 12/17/15 [id number]     Discharge Medications: Please see discharge summary for a list of discharge medications.  Relevant Imaging Results:  Relevant Lab Results:   Additional Information  (SSN: [id number])  Leane Call, Student-SW [phone]

## 2015-12-17 NOTE — Progress Notes (Signed)
Orthopedic Tech Progress Note Patient Details:  Elizabeth Stone 08-09-1946 [id number] Ortho visit on cpm at 1900 Patient ID: [name], female   DOB: Jan 16, 1946, 70 y.o.   MRN: [id number]   [name] 12/17/2015, 7:01 PM

## 2015-12-17 NOTE — Progress Notes (Signed)
Subjective: 1 Day Post-Op Procedure(s) (LRB): TOTAL KNEE ARTHROPLASTY (Right) Patient reports pain as 8 on 0-10 scale.    Objective: Vital signs in last 24 hours: Temp:  [97.8 F (36.6 C)-99.1 F (37.3 C)] 98.5 F (36.9 C) (02/28 0620) Pulse Rate:  [64-91] 66 (02/28 0620) Resp:  [7-22] 18 (02/28 0620) BP: (108-129)/(53-71) 113/59 mmHg (02/28 0620) SpO2:  [93 %-100 %] 97 % (02/28 0620)  Intake/Output from previous day: 02/27 0701 - 02/28 0700 In: 3040 [P.O.:240; I.V.:2800] Out: 2820 [Urine:2800; Blood:20] Intake/Output this shift:    No results for input(s): HGB in the last 72 hours. No results for input(s): WBC, RBC, HCT, PLT in the last 72 hours. No results for input(s): NA, K, CL, CO2, BUN, CREATININE, GLUCOSE, CALCIUM in the last 72 hours. No results for input(s): LABPT, INR in the last 72 hours.  ABD soft Neurovascular intact Sensation intact distally Dorsiflexion/Plantar flexion intact Incision: dressing C/D/I and no drainage  Assessment/Plan: 1 Day Post-Op Procedure(s) (LRB): TOTAL KNEE ARTHROPLASTY (Right)  Principal Problem:   Primary localized osteoarthritis of right knee Active Problems:   Arthritis   Anxiety   Depression   GERD (gastroesophageal reflux disease)   Meniere disease   Hearing impaired   Family history of ischemic heart disease   Essential hypertension, benign   Mixed hyperlipidemia  Advance diet Up with therapy D/C IV fluids  Will increase her clonazepam to TID prn as she is have significant shaking with ambulation.    Elizabeth Stone J 12/17/2015, 9:35 AM

## 2015-12-18 LAB — BASIC METABOLIC PANEL
ANION GAP: 9 (ref 5–15)
BUN: 11 mg/dL (ref 6–20)
CO2: 27 mmol/L (ref 22–32)
Calcium: 9.7 mg/dL (ref 8.9–10.3)
Chloride: 103 mmol/L (ref 101–111)
Creatinine, Ser: 0.57 mg/dL (ref 0.44–1.00)
Glucose, Bld: 161 mg/dL — ABNORMAL HIGH (ref 65–99)
POTASSIUM: 4.4 mmol/L (ref 3.5–5.1)
SODIUM: 139 mmol/L (ref 135–145)

## 2015-12-18 LAB — CBC
HCT: 34.6 % — ABNORMAL LOW (ref 36.0–46.0)
Hemoglobin: 11.5 g/dL — ABNORMAL LOW (ref 12.0–15.0)
MCH: 32.1 pg (ref 26.0–34.0)
MCHC: 33.2 g/dL (ref 30.0–36.0)
MCV: 96.6 fL (ref 78.0–100.0)
PLATELETS: 200 10*3/uL (ref 150–400)
RBC: 3.58 MIL/uL — AB (ref 3.87–5.11)
RDW: 12.5 % (ref 11.5–15.5)
WBC: 14.8 10*3/uL — AB (ref 4.0–10.5)

## 2015-12-18 NOTE — Progress Notes (Signed)
Physical Therapy Treatment Patient Details Name: Elizabeth Stone MRN: [id number] DOB: 12/22/1945 Today's Date: 12/18/2015    History of Present Illness 70 y.o. female admitted to [facility] on 12/15/14 for elective R TKA.  Pt with significant PMHx of bil cochlear implants.  Meniere's Disease, fibromyalgia, HTN, bipolar d/o, R reverse TSA, and hammer toe surgery.     PT Comments    Pt is progressing with her independence, but not yet with her gait distance.  She needs cues throughout session to breathe.  She states she did not sleep last night  Follow Up Recommendations  SNF     Equipment Recommendations  None recommended by PT    Recommendations for Other Services   NA     Precautions / Restrictions Precautions Precautions: Knee;Fall Precaution Booklet Issued: Yes (comment) Required Braces or Orthoses:  (d/c'd today) Restrictions Weight Bearing Restrictions: Yes RLE Weight Bearing: Weight bearing as tolerated    Mobility  Bed Mobility Overal bed mobility: Needs Assistance Bed Mobility: Sit to Supine       Sit to supine: Modified independent (Device/Increase time)   General bed mobility comments: Pt able to maneuver back into bed a scoot up with the help of the trapeeze bar.   Transfers Overall transfer level: Needs assistance Equipment used: Rolling walker (2 wheeled) Transfers: Sit to/from Stand Sit to Stand: Min guard         General transfer comment: Min guard assist for balance during transitions.  Verbal cues for safe hand placement.  At times RW needs to be stabilized as well.   Ambulation/Gait Ambulation/Gait assistance: Min assist Ambulation Distance (Feet): 65 Feet Assistive device: Rolling walker (2 wheeled) Gait Pattern/deviations: Step-through pattern;Antalgic Gait velocity: decreased Gait velocity interpretation: Below normal speed for age/gender General Gait Details: Cues for heel to toe gait pattern and increased knee flexion during swing. Pt reports  she is, "trying to walk more normally".  Pt needs cues to breathe throughout session, but especially during gait.        Balance Overall balance assessment: Needs assistance Sitting-balance support: Feet supported;No upper extremity supported Sitting balance-Leahy Scale: Good     Standing balance support: Bilateral upper extremity supported;Single extremity supported;No upper extremity supported Standing balance-Leahy Scale: Fair                      Cognition Arousal/Alertness: Awake/alert Behavior During Therapy: WFL for tasks assessed/performed Overall Cognitive Status: Within Functional Limits for tasks assessed                      Exercises Total Joint Exercises Ankle Circles/Pumps: AROM;Both;20 reps Quad Sets: AROM;Right;10 reps Towel Squeeze: AROM;Both;10 reps Heel Slides: AAROM;Right;10 reps        Pertinent Vitals/Pain Pain Assessment: 0-10 Pain Score: 7  Pain Location: right knee Pain Descriptors / Indicators: Burning;Aching Pain Intervention(s): Limited activity within patient's tolerance;Monitored during session;Repositioned           PT Goals (current goals can now be found in the care plan section) Acute Rehab PT Goals Patient Stated Goal: get back on the bicycle Progress towards PT goals: Progressing toward goals    Frequency  7X/week    PT Plan Current plan remains appropriate       End of Session Equipment Utilized During Treatment: Gait belt Activity Tolerance: Patient limited by pain Patient left: in bed;with call bell/phone within reach     Time: 1022-1041 PT Time Calculation (min) (ACUTE ONLY): 19 min  Charges:  $Gait Training: 8-22 mins                      Senna Lape B. Paulette Lynch, PT, DPT [phone]   12/18/2015, 11:35 AM

## 2015-12-18 NOTE — Progress Notes (Addendum)
Physical Therapy Treatment Patient Details Name: Elizabeth Stone MRN: [id number] DOB: 12/05/1945 Today's Date: 12/18/2015    History of Present Illness 70 y.o. female admitted to [facility] on 12/15/14 for elective R TKA.  Pt with significant PMHx of bil cochlear implants.  Meniere's Disease, fibromyalgia, HTN, bipolar d/o, R reverse TSA, and hammer toe surgery.     PT Comments    Pt performed increased gait distance requiring decreased assist for all mobility.  Gait performed without KI secondary to improved quad control.  Pt reports pain 4/10 and CP applied post tx to reduce pain.  Pt placed in 0 degree bone foam post tx.    Follow Up Recommendations  SNF     Equipment Recommendations  None recommended by PT    Recommendations for Other Services       Precautions / Restrictions Precautions Precautions: Knee;Fall Precaution Booklet Issued: Yes (comment) Precaution Comments: reviewed precuations, went over seated therapeutic exercises.   Required Braces or Orthoses:  (Pt has KI but did not require for tx.  ) Restrictions Weight Bearing Restrictions: Yes RLE Weight Bearing: Weight bearing as tolerated    Mobility  Bed Mobility Overal bed mobility:  (Pt received in recliner chair.  ) Bed Mobility: Sit to Supine       Sit to supine: Modified independent (Device/Increase time)   General bed mobility comments: Pt able to maneuver back into bed a scoot up with the help of the trapeeze bar.   Transfers Overall transfer level: Needs assistance Equipment used: Rolling walker (2 wheeled) Transfers: Sit to/from Stand Sit to Stand: Supervision         General transfer comment: Cues for hand placement to avoid pulling on RW.  Pt demonstrated good eccentric loading and R knee flexion from stand to sit.    Ambulation/Gait Ambulation/Gait assistance: Min guard Ambulation Distance (Feet): 120 Feet Assistive device: Rolling walker (2 wheeled) Gait Pattern/deviations: Step-through  pattern;Antalgic;Decreased step length - left;Decreased stance time - right Gait velocity: decreased Gait velocity interpretation: Below normal speed for age/gender General Gait Details: Cues for gt symmetry and R heel strike to improve gait quality. Pt remains to present with decreased R knee flexion in swing phase.     Stairs            Wheelchair Mobility    Modified Rankin (Stroke Patients Only)       Balance Overall balance assessment: Needs assistance Sitting-balance support: Feet supported;No upper extremity supported Sitting balance-Leahy Scale: Good     Standing balance support: Bilateral upper extremity supported;Single extremity supported;No upper extremity supported Standing balance-Leahy Scale: Fair                      Cognition Arousal/Alertness: Awake/alert Behavior During Therapy: WFL for tasks assessed/performed Overall Cognitive Status: Within Functional Limits for tasks assessed                      Exercises Total Joint Exercises Ankle Circles/Pumps: AROM;Both;20 reps Quad Sets: AROM;Right;10 reps Gluteal Sets: AROM;Both;10 reps Towel Squeeze: AROM;Both;10 reps Heel Slides: Right;10 reps;AROM Hip ABduction/ADduction: AROM;Right;10 reps Straight Leg Raises: AROM;Right;10 reps Knee Flexion: AROM;Right;10 reps;Seated Other Exercises Other Exercises: all therapeutic exercise performed in reclined position excluding LAQs.      General Comments        Pertinent Vitals/Pain Pain Assessment: 0-10 Pain Score: 4  Pain Location: R knee Pain Descriptors / Indicators: Burning;Aching Pain Intervention(s): Premedicated before session;Repositioned    Home Living  Prior Function            PT Goals (current goals can now be found in the care plan section) Acute Rehab PT Goals Patient Stated Goal: get back on the bicycle Potential to Achieve Goals: Good Progress towards PT goals: Progressing toward  goals    Frequency  7X/week    PT Plan Current plan remains appropriate    Co-evaluation             End of Session Equipment Utilized During Treatment: Gait belt Activity Tolerance: Patient limited by pain Patient left: in bed;with call bell/phone within reach     Time: [id number] PT Time Calculation (min) (ACUTE ONLY): 20 min  Charges:  $Gait Training: 8-22 mins $Therapeutic Exercise: 8-22 mins                    G Codes:      Cristela Blue 12/27/2015, 1:46 PM Governor Rooks, PTA pager [phone]  Second unit for gait training charted in error.  Pt should only receive charge for therapeutic exercise.   Governor Rooks, PTA pager [phone]

## 2015-12-18 NOTE — Progress Notes (Signed)
Subjective: 2 Days Post-Op Procedure(s) (LRB): TOTAL KNEE ARTHROPLASTY (Right) Patient reports pain as 3 on 0-10 scale.    Objective: Vital signs in last 24 hours: Temp:  [98.4 F (36.9 C)-99.4 F (37.4 C)] 98.4 F (36.9 C) (03/01 0536) Pulse Rate:  [77-93] 77 (03/01 0536) Resp:  [17] 17 (03/01 0536) BP: (121-126)/(43-56) 121/56 mmHg (03/01 0536) SpO2:  [93 %-96 %] 93 % (03/01 0536)  Intake/Output from previous day: 02/28 0701 - 03/01 0700 In: 740 [P.O.:740] Out: -  Intake/Output this shift:     Recent Labs  12/17/15 0819 12/18/15 0430  HGB 11.7* 11.5*    Recent Labs  12/17/15 0819 12/18/15 0430  WBC 16.1* 14.8*  RBC 3.50* 3.58*  HCT 33.6* 34.6*  PLT 202 200    Recent Labs  12/17/15 0819 12/18/15 0430  NA 137 139  K 4.0 4.4  CL 104 103  CO2 25 27  BUN 12 11  CREATININE 0.66 0.57  GLUCOSE 162* 161*  CALCIUM 9.3 9.7   No results for input(s): LABPT, INR in the last 72 hours.  ABD soft Neurovascular intact Sensation intact distally Intact pulses distally Dorsiflexion/Plantar flexion intact Incision: scant drainage  Assessment/Plan: 2 Days Post-Op Procedure(s) (LRB): TOTAL KNEE ARTHROPLASTY (Right)  Principal Problem:   Primary localized osteoarthritis of right knee Active Problems:   Arthritis   Anxiety   Depression   GERD (gastroesophageal reflux disease)   Meniere disease   Hearing impaired   Family history of ischemic heart disease   Essential hypertension, benign   Mixed hyperlipidemia  Advance diet Up with therapy Plan for discharge tomorrow Discharge to SNF  [facility] 12/18/2015, 7:58 AM

## 2015-12-19 ENCOUNTER — Encounter (HOSPITAL_COMMUNITY): Payer: Self-pay | Admitting: Orthopedic Surgery

## 2015-12-19 DIAGNOSIS — F310 Bipolar disorder, current episode hypomanic: Secondary | ICD-10-CM | POA: Diagnosis not present

## 2015-12-19 DIAGNOSIS — F419 Anxiety disorder, unspecified: Secondary | ICD-10-CM | POA: Diagnosis not present

## 2015-12-19 DIAGNOSIS — F329 Major depressive disorder, single episode, unspecified: Secondary | ICD-10-CM | POA: Diagnosis not present

## 2015-12-19 DIAGNOSIS — F410 Panic disorder [episodic paroxysmal anxiety] without agoraphobia: Secondary | ICD-10-CM | POA: Diagnosis not present

## 2015-12-19 DIAGNOSIS — Z471 Aftercare following joint replacement surgery: Secondary | ICD-10-CM | POA: Diagnosis not present

## 2015-12-19 DIAGNOSIS — D62 Acute posthemorrhagic anemia: Secondary | ICD-10-CM | POA: Diagnosis not present

## 2015-12-19 DIAGNOSIS — Z96652 Presence of left artificial knee joint: Secondary | ICD-10-CM | POA: Diagnosis not present

## 2015-12-19 DIAGNOSIS — M25561 Pain in right knee: Secondary | ICD-10-CM | POA: Diagnosis not present

## 2015-12-19 DIAGNOSIS — K219 Gastro-esophageal reflux disease without esophagitis: Secondary | ICD-10-CM | POA: Diagnosis not present

## 2015-12-19 DIAGNOSIS — R600 Localized edema: Secondary | ICD-10-CM | POA: Diagnosis not present

## 2015-12-19 DIAGNOSIS — M790 Rheumatism, unspecified: Secondary | ICD-10-CM | POA: Diagnosis not present

## 2015-12-19 DIAGNOSIS — M797 Fibromyalgia: Secondary | ICD-10-CM | POA: Diagnosis not present

## 2015-12-19 DIAGNOSIS — E0821 Diabetes mellitus due to underlying condition with diabetic nephropathy: Secondary | ICD-10-CM | POA: Diagnosis not present

## 2015-12-19 DIAGNOSIS — M6281 Muscle weakness (generalized): Secondary | ICD-10-CM | POA: Diagnosis not present

## 2015-12-19 DIAGNOSIS — Z96651 Presence of right artificial knee joint: Secondary | ICD-10-CM | POA: Diagnosis not present

## 2015-12-19 DIAGNOSIS — R262 Difficulty in walking, not elsewhere classified: Secondary | ICD-10-CM | POA: Diagnosis not present

## 2015-12-19 DIAGNOSIS — I10 Essential (primary) hypertension: Secondary | ICD-10-CM | POA: Diagnosis not present

## 2015-12-19 DIAGNOSIS — E785 Hyperlipidemia, unspecified: Secondary | ICD-10-CM | POA: Diagnosis not present

## 2015-12-19 DIAGNOSIS — K5901 Slow transit constipation: Secondary | ICD-10-CM | POA: Diagnosis not present

## 2015-12-19 DIAGNOSIS — E782 Mixed hyperlipidemia: Secondary | ICD-10-CM | POA: Diagnosis not present

## 2015-12-19 DIAGNOSIS — H8101 Meniere's disease, right ear: Secondary | ICD-10-CM | POA: Diagnosis not present

## 2015-12-19 DIAGNOSIS — R2681 Unsteadiness on feet: Secondary | ICD-10-CM | POA: Diagnosis not present

## 2015-12-19 DIAGNOSIS — M1711 Unilateral primary osteoarthritis, right knee: Secondary | ICD-10-CM | POA: Diagnosis not present

## 2015-12-19 DIAGNOSIS — M6200 Separation of muscle (nontraumatic), unspecified site: Secondary | ICD-10-CM | POA: Diagnosis not present

## 2015-12-19 DIAGNOSIS — G8929 Other chronic pain: Secondary | ICD-10-CM | POA: Diagnosis not present

## 2015-12-19 DIAGNOSIS — E876 Hypokalemia: Secondary | ICD-10-CM | POA: Diagnosis not present

## 2015-12-19 LAB — BASIC METABOLIC PANEL
ANION GAP: 8 (ref 5–15)
BUN: 13 mg/dL (ref 6–20)
CHLORIDE: 106 mmol/L (ref 101–111)
CO2: 27 mmol/L (ref 22–32)
Calcium: 9.2 mg/dL (ref 8.9–10.3)
Creatinine, Ser: 0.7 mg/dL (ref 0.44–1.00)
GFR calc Af Amer: >60 mL/min (ref >60)
GFR calc non Af Amer: >60 mL/min (ref >60)
GLUCOSE: 102 mg/dL — AB (ref 65–99)
POTASSIUM: 3.7 mmol/L (ref 3.5–5.1)
Sodium: 141 mmol/L (ref 135–145)

## 2015-12-19 LAB — CBC
HEMATOCRIT: 34.2 % — AB (ref 36.0–46.0)
Hemoglobin: 11.2 g/dL — ABNORMAL LOW (ref 12.0–15.0)
MCH: 32 pg (ref 26.0–34.0)
MCHC: 32.7 g/dL (ref 30.0–36.0)
MCV: 97.7 fL (ref 78.0–100.0)
Platelets: 207 10*3/uL (ref 150–400)
RBC: 3.5 MIL/uL — AB (ref 3.87–5.11)
RDW: 12.7 % (ref 11.5–15.5)
WBC: 10.5 10*3/uL (ref 4.0–10.5)

## 2015-12-19 LAB — CBC AND DIFFERENTIAL: WBC: 10.5 10*3/mL

## 2015-12-19 MED ORDER — ASPIRIN 325 MG PO TBEC: DELAYED_RELEASE_TABLET | ORAL | Status: DC

## 2015-12-19 MED ORDER — DOCUSATE SODIUM 100 MG PO CAPS: ORAL_CAPSULE | ORAL | Status: DC

## 2015-12-19 MED ORDER — CLONAZEPAM 0.5 MG PO TABS: ORAL_TABLET | ORAL | Status: DC

## 2015-12-19 MED ORDER — POLYETHYLENE GLYCOL 3350 17 G PO PACK: PACK | ORAL | Status: DC

## 2015-12-19 MED ORDER — OXYCODONE HCL 5 MG PO TABS: ORAL_TABLET | ORAL | Status: DC

## 2015-12-19 NOTE — OR Nursing (Signed)
Late entry, clerical error entering start time of procedure corrected.

## 2015-12-19 NOTE — Progress Notes (Signed)
Today I was notified that Mrs Elizabeth Stone refused to be discharged until her hearing aids were taken care of.  Mrs Elizabeth Stone has been her since Monday 3/1 and never complained about the incident (her cochlar B[employer] inplants battery was broken).  She stated to Kyra Elizabeth Stone that she stuffed her battery holder into a little pouch. At 8 am Mrs Elizabeth Stone stated she was not leaving until this problem was resolved with her battery, at that time [facility] Elizabeth Stone was notified about pt. At that time [facility] was notified by Elizabeth Stone regarding the situation and the she was very sorry for how her mother was acting and for what the staff were going thru.  At about 10:30 Elizabeth Stone was notified Mrs Elizabeth Stone sister in law and asked to come up to the floor and help discussed with the pt about her discharge.  At 12:30 [facility] was notified for the pt to be picked up to go to [facility] which we explained over and over it was a rehab admission. Around 3:30 Elizabeth Stone showed to take Mrs Elizabeth [facility] to [facility] and she refused and say she is not going until the situation was resolved Elizabeth Stone the Education officer, museum was notified).  At that time Elizabeth Stone was notified regarding the situation and she notified Risk Mgmt about the Mrs Elizabeth Stone refusing transport they stated they would contact her after a full investigation was completed.  Then again at 3:55 [facility] left with plans to return for pt. At 4:15 pt agreed to go [facility] with pain medication at time of departure.  7:00 pm pt waiting comfortably for [facility] to come and get her.

## 2015-12-19 NOTE — Discharge Summary (Signed)
Patient ID: Elizabeth Stone MRN: [id number] DOB/AGE: 68/16/1947 68 y.o.  Admit date: 12/16/2015 Discharge date: 12/19/2015  Admission Diagnoses:  Principal Problem:   Primary localized osteoarthritis of right knee Active Problems:   Arthritis   Anxiety   Depression   GERD (gastroesophageal reflux disease)   Meniere disease   Hearing impaired   Family history of ischemic heart disease   Essential hypertension, benign   Mixed hyperlipidemia   Discharge Diagnoses:  Same  Past Medical History  Diagnosis Date  . Arthritis   . Anxiety   . Depression   . GERD (gastroesophageal reflux disease)   . Meniere disease     takes HCTZ for her inner ear problem  . Osteoporosis   . Hearing impaired     has cochlear implant  . Mixed hyperlipidemia 09/27/2013  . Fibromyalgia   . HTN (hypertension)   . Primary localized osteoarthritis of right knee   . Bipolar disorder ([place])     Surgeries: Procedure(s): TOTAL KNEE ARTHROPLASTY on 12/16/2015   Consultants:    Discharged Condition: Improved  Hospital Course: MYLDRED STICKEL is an 69 y.o. female who was admitted 12/16/2015 for operative treatment ofPrimary localized osteoarthritis of right knee. Patient has severe unremitting pain that affects sleep, daily activities, and work/hobbies. After pre-op clearance the patient was taken to the operating room on 12/16/2015 and underwent  Procedure(s): TOTAL KNEE ARTHROPLASTY.    Patient was given perioperative antibiotics: Anti-infectives    Start     Dose/Rate Route Frequency Ordered Stop   12/16/15 1630  ceFAZolin (ANCEF) IVPB 2 g/50 mL premix     2 g 100 mL/hr over 30 Minutes Intravenous Every 6 hours 12/16/15 1532 12/16/15 2250   12/16/15 0600  ceFAZolin (ANCEF) IVPB 2 g/50 mL premix     2 g 100 mL/hr over 30 Minutes Intravenous On call to O.R. 12/15/15 1417 12/16/15 0945       Patient was given sequential compression devices, early ambulation, and chemoprophylaxis to prevent DVT.  This  patient is on multiple mood stabilizing medications.  She did not get her Saphris the first night because the hospital did not have.  The next day she had significant difficulty with her anxiety.  She has done better now that she is taking her on Saphris and Clonazepam 50 at night.  I have written and order for her to be able to get Clonazepam prn during the day at SNF if her anxiety flares.  I expect her to be at SNF for 5-7 days then returning home to independent living.  Patient benefited maximally from hospital stay and there were no complications.    Recent vital signs: Patient Vitals for the past 24 hrs:  BP Temp Temp src Pulse Resp SpO2  12/19/15 0459 (!) 122/48 mmHg 98 F (36.7 C) Oral 81 16 96 %  12/18/15 1956 114/87 mmHg 98.6 F (37 C) Oral 94 15 99 %  12/18/15 1313 122/62 mmHg - - 97 17 100 %     Recent laboratory studies:  Recent Labs  12/18/15 0430 12/19/15 0400  WBC 14.8* 10.5  HGB 11.5* 11.2*  HCT 34.6* 34.2*  PLT 200 207  NA 139 141  K 4.4 3.7  CL 103 106  CO2 27 27  BUN 11 13  CREATININE 0.57 0.70  GLUCOSE 161* 102*  CALCIUM 9.7 9.2     Discharge Medications:     Medication List    STOP taking these medications  aspirin 81 MG tablet  Replaced by:  aspirin 325 MG EC tablet      TAKE these medications        aspirin 325 MG EC tablet  1 tab po BID for 30 days to prevent blood clots     atorvastatin 10 MG tablet  Commonly known as:  LIPITOR  Take 10 mg by mouth daily.     buPROPion 300 MG 24 hr tablet  Commonly known as:  WELLBUTRIN XL  Take 300 mg by mouth Daily.     CALCIUM PO  Take 1,200 mg by mouth 2 (two) times daily.     CELEBREX 200 MG capsule  Generic drug:  celecoxib  Take 200 mg by mouth Daily.     cholecalciferol 1000 units tablet  Commonly known as:  VITAMIN D  Take 1,000 Units by mouth daily.     clonazePAM 0.5 MG tablet  Commonly known as:  KLONOPIN  1-2 tablets three times a day as needed for anxiety.  Patient  routinely takes 2 tablets before bed for sleep     CYMBALTA 60 MG capsule  Generic drug:  DULoxetine  Take 60 mg by mouth 2 (two) times daily.     docusate sodium 100 MG capsule  Commonly known as:  COLACE  1 tab 2 times a day while on narcotics.  STOOL SOFTENER     Fish Oil 1200 MG Caps  Take 1,200 mg by mouth daily.     hydrochlorothiazide 25 MG tablet  Commonly known as:  HYDRODIURIL  Take 25 mg by mouth Daily.     KLOR-CON M20 20 MEQ tablet  Generic drug:  potassium chloride SA  Take 20 mEq by mouth 2 (two) times daily.     lamoTRIgine 200 MG tablet  Commonly known as:  LAMICTAL  Take 200 mg by mouth daily.     montelukast 10 MG tablet  Commonly known as:  SINGULAIR  Take 10 mg by mouth at bedtime.     oxyCODONE 5 MG immediate release tablet  Commonly known as:  Oxy IR/ROXICODONE  1-2 tablets every 4-6 hrs as needed for pain     polyethylene glycol packet  Commonly known as:  MIRALAX / GLYCOLAX  17grams in 4 oz of water twice a day until bowel movement.  LAXITIVE.  Restart if two days since last bowel movement     SAPHRIS 10 MG Subl  Generic drug:  Asenapine Maleate  Take 10 mg by mouth At bedtime.     vitamin C 500 MG tablet  Commonly known as:  ASCORBIC ACID  Take 500 mg by mouth 2 (two) times daily.        Diagnostic Studies: No results found.  Disposition: 01-Home or Self Care      Discharge Instructions    CPM    Complete by:  As directed   Continuous passive motion machine (CPM):      Use the CPM from 0 to 90 for 6 hours per day.       You may break it up into 2 or 3 sessions per day.      Use CPM for 2 weeks or until you are told to stop.     Call MD / Call 911    Complete by:  As directed   If you experience chest pain or shortness of breath, CALL 911 and be transported to the hospital emergency room.  If you develope a fever above 101 F, pus (white  drainage) or increased drainage or redness at the wound, or calf pain, call your surgeon's  office.     Change dressing    Complete by:  As directed   Change the gauze dressing daily with sterile 4 x 4 inch gauze and apply TED hose.  DO NOT REMOVE BANDAGE OVER SURGICAL INCISION.  [place] WHOLE LEG INCLUDING OVER THE WATERPROOF BANDAGE WITH SOAP AND WATER EVERY DAY.     Constipation Prevention    Complete by:  As directed   Drink plenty of fluids.  Prune juice may be helpful.  You may use a stool softener, such as Colace (over the counter) 100 mg twice a day.  Use MiraLax (over the counter) for constipation as needed.     Diet - low sodium heart healthy    Complete by:  As directed      Discharge instructions    Complete by:  As directed   INSTRUCTIONS AFTER JOINT REPLACEMENT   Remove items at home which could result in a fall. This includes throw rugs or furniture in walking pathways ICE to the affected joint every three hours while awake for 30 minutes at a time, for at least the first 3-5 days, and then as needed for pain and swelling.  Continue to use ice for pain and swelling. You may notice swelling that will progress down to the foot and ankle.  This is normal after surgery.  Elevate your leg when you are not up walking on it.   Continue to use the breathing machine you got in the hospital (incentive spirometer) which will help keep your temperature down.  It is common for your temperature to cycle up and down following surgery, especially at night when you are not up moving around and exerting yourself.  The breathing machine keeps your lungs expanded and your temperature down.   DIET:  As you were doing prior to hospitalization, we recommend a well-balanced diet.  DRESSING / WOUND CARE / SHOWERING  Keep the surgical dressing until follow up.  The dressing is water proof, so you can shower without any extra covering.  IF THE DRESSING FALLS OFF or the wound gets wet inside, change the dressing with sterile gauze.  Please use good hand washing techniques before changing the  dressing.  Do not use any lotions or creams on the incision until instructed by your surgeon.    ACTIVITY  Increase activity slowly as tolerated, but follow the weight bearing instructions below.   No driving for 6 weeks or until further direction given by your physician.  You cannot drive while taking narcotics.  No lifting or carrying greater than 10 lbs. until further directed by your surgeon. Avoid periods of inactivity such as sitting longer than an hour when not asleep. This helps prevent blood clots.  You may return to work once you are authorized by your doctor.     WEIGHT BEARING   Weight bearing as tolerated with assist device (walker, cane, etc) as directed, use it as long as suggested by your surgeon or therapist, typically at least 2-3 weeks.   EXERCISES  Results after joint replacement surgery are often greatly improved when you follow the exercise, range of motion and muscle strengthening exercises prescribed by your doctor. Safety measures are also important to protect the joint from further injury. Any time any of these exercises cause you to have increased pain or swelling, decrease what you are doing until you are comfortable again and then slowly increase  them. If you have problems or questions, call your caregiver or physical therapist for advice.   Rehabilitation is important following a joint replacement. After just a few days of immobilization, the muscles of the leg can become weakened and shrink (atrophy).  These exercises are designed to build up the tone and strength of the thigh and leg muscles and to improve motion. Often times heat used for twenty to thirty minutes before working out will loosen up your tissues and help with improving the range of motion but do not use heat for the first two weeks following surgery (sometimes heat can increase post-operative swelling).   These exercises can be done on a training (exercise) mat, on the floor, on a table or on a  bed. Use whatever works the best and is most comfortable for you.    Use music or television while you are exercising so that the exercises are a pleasant break in your day. This will make your life better with the exercises acting as a break in your routine that you can look forward to.   Perform all exercises about fifteen times, three times per day or as directed.  You should exercise both the operative leg and the other leg as well.   Exercises include:  Quad Sets - Tighten up the muscle on the front of the thigh (Quad) and hold for 5-10 seconds.   Straight Leg Raises - With your knee straight (if you were given a brace, keep it on), lift the leg to 60 degrees, hold for 3 seconds, and slowly lower the leg.  Perform this exercise against resistance later as your leg gets stronger.  Leg Slides: Lying on your back, slowly slide your foot toward your buttocks, bending your knee up off the floor (only go as far as is comfortable). Then slowly slide your foot back down until your leg is flat on the floor again.  Angel Wings: Lying on your back spread your legs to the side as far apart as you can without causing discomfort.  Hamstring Strength:  Lying on your back, push your heel against the floor with your leg straight by tightening up the muscles of your buttocks.  Repeat, but this time bend your knee to a comfortable angle, and push your heel against the floor.  You may put a pillow under the heel to make it more comfortable if necessary.   A rehabilitation program following joint replacement surgery can speed recovery and prevent re-injury in the future due to weakened muscles. Contact your doctor or a physical therapist for more information on knee rehabilitation.    CONSTIPATION  Constipation is defined medically as fewer than three stools per week and severe constipation as less than one stool per week.  Even if you have a regular bowel pattern at home, your normal regimen is likely to be  disrupted due to multiple reasons following surgery.  Combination of anesthesia, postoperative narcotics, change in appetite and fluid intake all can affect your bowels.   YOU MUST use at least one of the following options; they are listed in order of increasing strength to get the job done.  They are all available over the counter, and you may need to use some, POSSIBLY even all of these options:    Drink plenty of fluids (prune juice may be helpful) and high fiber foods Colace 100 mg by mouth twice a day  Senokot for constipation as directed and as needed Dulcolax (bisacodyl), take with full glass  of water  Miralax (polyethylene glycol) once or twice a day as needed.  If you have tried all these things and are unable to have a bowel movement in the first 3-4 days after surgery call either your surgeon or your primary doctor.    If you experience loose stools or diarrhea, hold the medications until you stool forms back up.  If your symptoms do not get better within 1 week or if they get worse, check with your doctor.  If you experience "the worst abdominal pain ever" or develop nausea or vomiting, please contact the office immediately for further recommendations for treatment.   ITCHING:  If you experience itching with your medications, try taking only a single pain pill, or even half a pain pill at a time.  You can also use Benadryl over the counter for itching or also to help with sleep.   TED HOSE STOCKINGS:  Use stockings on both legs until for at least 2 weeks or as directed by physician office. They may be removed at night for sleeping.  MEDICATIONS:  See your medication summary on the "After Visit Summary" that nursing will review with you.  You may have some home medications which will be placed on hold until you complete the course of blood thinner medication.  It is important for you to complete the blood thinner medication as prescribed.  PRECAUTIONS:  If you experience chest pain or  shortness of breath - call 911 immediately for transfer to the hospital emergency department.   If you develop a fever greater that 101 F, purulent drainage from wound, increased redness or drainage from wound, foul odor from the wound/dressing, or calf pain - CONTACT YOUR SURGEON.                                                   FOLLOW-UP APPOINTMENTS:  If you do not already have a post-op appointment, please call the office for an appointment to be seen by your surgeon.  Guidelines for how soon to be seen are listed in your "After Visit Summary", but are typically between 1-4 weeks after surgery.  OTHER INSTRUCTIONS:   Knee Replacement:  Do not place pillow under knee, focus on keeping the knee straight while resting. CPM instructions: 0-90 degrees, 2 hours in the morning, 2 hours in the afternoon, and 2 hours in the evening. Place foam block, curve side up under heel at all times except when in CPM or when walking.  DO NOT modify, tear, cut, or change the foam block in any way.  MAKE SURE YOU:  Understand these instructions.  Get help right away if you are not doing well or get worse.    Thank you for letting us be a part of your medical care team.  It is a privilege we respect greatly.  We hope these instructions will help you stay on track for a fast and full recovery!     Do not put a pillow under the knee. Place it under the heel.    Complete by:  As directed   Place gray foam block, curve side up under heel at all times except when in CPM or when walking.  DO NOT modify, tear, cut, or change in any way the gray foam block.     Increase activity slowly as tolerated  Complete by:  As directed      Patient may shower    Complete by:  As directed   Aquacel dressing is water proof    Wash over it and the whole leg with soap and water at the end of your shower     TED hose    Complete by:  As directed   Use stockings (TED hose) for 2 weeks on both leg(s).  You may remove them at night  for sleeping.           Follow-up Information    Follow up with [name], MD On 12/30/2015.   Specialty:  Orthopedic Surgery   Why:  APPT TIME 2:15 PM   Contact information:   [place] [address] [phone]        Signed: Linda Hedges 12/19/2015, 8:25 AM

## 2015-12-19 NOTE — Progress Notes (Signed)
Orthopedic Tech Progress Note Patient Details:  Elizabeth [facility] June 24, 1946 [id number]  Patient ID: [facility], female   DOB: [birth year], 70 y.o.   MRN: [id number] cpm on 0-55  Karolee Stamps 12/19/2015, 5:33 AM

## 2015-12-19 NOTE — Progress Notes (Signed)
Occupational Therapy Treatment Patient Details Name: Elizabeth Stone MRN: [id number] DOB: February 01, 1946 Today's Date: 12/19/2015    History of present illness 70 y.o. female admitted to [facility] on 12/15/14 for elective R TKA.  Pt with significant PMHx of bil cochlear implants.  Meniere's Disease, fibromyalgia, HTN, bipolar d/o, R reverse TSA, and hammer toe surgery.    OT comments  Pt extremely agitated and yelled for therapist in hallway to come into room to answer pt's questions. Pt voicing concerns about donning/doffing clothing and LB ADLs. Had lengthy discussion about pt discharging to SNF and how they would address these ADL concerns there, but pt was unsatisfied with this response. Demonstrated and practiced use of AE for LB ADLs, but pt was too agitated to demonstrate any signs of learning. Encouraged pt to write down concerns to address at SNF with therapists in conjunction with therapist's documentation. Will continue to follow acutely.   Follow Up Recommendations  SNF;Supervision/Assistance - 24 hour    Equipment Recommendations  Other (comment) (TBD in next venue)    Recommendations for Other Services      Precautions / Restrictions Precautions Precautions: Knee;Fall Precaution Booklet Issued: No Precaution Comments: reviewed therex with pt Required Braces or Orthoses: Knee Immobilizer - Right Restrictions Weight Bearing Restrictions: Yes RLE Weight Bearing: Weight bearing as tolerated       Mobility Bed Mobility               General bed mobility comments: Pt up in chair on OT arrival  Transfers Overall transfer level: Needs assistance Equipment used: Rolling walker (2 wheeled) Transfers: Sit to/from Stand Sit to Stand: Supervision         General transfer comment: No transfers attempted this session    Balance Overall balance assessment: Needs assistance Sitting-balance support: No upper extremity supported;Feet supported Sitting balance-Leahy Scale: Good        Standing balance-Leahy Scale: Fair Standing balance comment: able to wash hands at sink                   ADL Overall ADL's : Needs assistance/impaired                     Lower Body Dressing: Moderate assistance;With adaptive equipment;Cueing for sequencing;Cueing for compensatory techniques;Sit to/from stand Lower Body Dressing Details (indicate cue type and reason): Demonstrated AE use for LB dressing and addressed donning/doffing TED hose                General ADL Comments: Pt extremely agitated and yelled for therapist out in hallway to come into the room to answer questions. Pt concerned about donning/doffing TED hose since she is supposed to wear them for 1 month - explained to pt that she would gain UE strength and LB flexibility over time at SNF and learn compensatory strategies to be able to do this. Pt was no accepting of this response and wanted specific "machine" that would put the TED hose on her - encouraged pt to sue smartphone to look up possible ideas for such a device, but reiterated that rehab therapists at SNF would address this concern with her at rehab.      Vision                     Perception     Praxis      Cognition   Behavior During Therapy: Anxious Overall Cognitive Status: Within Functional Limits for tasks assessed  Memory: Decreased short-term memory               Extremity/Trunk Assessment               Exercises Total Joint Exercises Ankle Circles/Pumps: AROM;Both;20 reps Quad Sets: AROM;Right;10 reps Towel Squeeze: Strengthening;Both;10 reps;Seated Short Arc Quad: AROM;Right;10 reps;Supine Heel Slides: AAROM;Right;10 reps;Seated Hip ABduction/ADduction: AROM;AAROM;Right;10 reps;Seated Straight Leg Raises: Strengthening;5 reps;Right;Seated Other Exercises Other Exercises: therex performed in recliner   Shoulder Instructions       General Comments      Pertinent Vitals/ Pain        Pain Assessment: Faces Faces Pain Scale: Hurts little more Pain Location: Rknee Pain Descriptors / Indicators: Grimacing;Guarding Pain Intervention(s): Limited activity within patient's tolerance;Monitored during session;Repositioned  Home Living                                          Prior Functioning/Environment              Frequency Min 2X/week     Progress Toward Goals  OT Goals(current goals can now be found in the care plan section)  Progress towards OT goals: Progressing toward goals  Acute Rehab OT Goals Patient Stated Goal: get back on the bicycle OT Goal Formulation: With patient Potential to Achieve Goals: Good ADL Goals Pt Will Perform Lower Body Bathing: with min guard assist;with adaptive equipment;sitting/lateral leans;sit to/from stand Pt Will Perform Lower Body Dressing: with min guard assist;with adaptive equipment;sitting/lateral leans;sit to/from stand Pt Will Transfer to Toilet: with min guard assist;ambulating;bedside commode Pt Will Perform Toileting - Clothing Manipulation and hygiene: with min guard assist;sitting/lateral leans;sit to/from stand  Plan Discharge plan remains appropriate    Co-evaluation                 End of Session Equipment Utilized During Treatment: Other (comment) (AE)   Activity Tolerance Treatment limited secondary to agitation   Patient Left in chair;with call bell/phone within reach;with family/visitor present   Nurse Communication Mobility status        Time: 1421-1431 OT Time Calculation (min): 10 min  Charges: OT General Charges $OT Visit: 1 Procedure OT Treatments $Self Care/Home Management : 8-22 mins  Redmond Baseman, OTR/L Pager: [phone] 12/19/2015, 4:52 PM

## 2015-12-19 NOTE — Progress Notes (Signed)
Discharge papers review along with medications,

## 2015-12-19 NOTE — Clinical Social Work Note (Addendum)
Patient to be d/c'ed today to [facility].  Patient and family agreeable to plans will transport via EMS RN to call report [phone].  Evette Cristal, MSW, [place]

## 2015-12-19 NOTE — Progress Notes (Signed)
PT Cancellation Note  Patient Details Name: ANISHA ITA MRN: [id number] DOB: 04/17/1946   Cancelled Treatment:    Reason Eval/Treat Not Completed: Patient just getting breakfast tray.  Assisted pt getting it set up so she could eat cereal. Pt very upset and stated she feels she is not ready to leave the hospital due to the fact she has 9 steps to get into her home.  Explained that was purpose of rehab and that they will be working with her on this.  Pt continued to be upset about pending d/c to rehab facility.  Pt left eating breakfast and nursing notified of pt's statements.   Delray Reza LUBECK 12/19/2015, 9:51 AM

## 2015-12-19 NOTE — Progress Notes (Signed)
Physical Therapy Treatment Patient Details Name: Elizabeth Stone MRN: [id number] DOB: 02/09/1946 Today's Date: 12/19/2015    History of Present Illness 70 y.o. female admitted to [facility] on 12/15/14 for elective R TKA.  Pt with significant PMHx of bil cochlear implants.  Meniere's Disease, fibromyalgia, HTN, bipolar d/o, R reverse TSA, and hammer toe surgery.     PT Comments    Pt awaiting d/c to [facility] this afternoon.  Pt very anxious, stating she didn't get enough therapy while in acute care.  Reviewed that she was seen QD on 1st day and BID last 2 days and PT had attempted this AM. Pt wanted PT to review therex with her stating "they never went over this with me."  Notes indicate pt has been educated multiple times.  Pt perseverating on the fact she has stairs and can't go home and reviewed again the purpose of her going to SNF rehab.  Pt extremely anxious throughout session.  Pt did ambulate to the bathroom and moving well overall.  Anxiety appears to be limiting her.  Con't to recommend SNF.  Follow Up Recommendations  SNF     Equipment Recommendations  None recommended by PT    Recommendations for Other Services       Precautions / Restrictions Precautions Precautions: Knee;Fall Precaution Booklet Issued: Yes (comment) Precaution Comments: reviewed therex with pt Required Braces or Orthoses: Knee Immobilizer - Right Restrictions RLE Weight Bearing: Weight bearing as tolerated    Mobility  Bed Mobility               General bed mobility comments: up in recliner  Transfers Overall transfer level: Needs assistance Equipment used: Rolling walker (2 wheeled) Transfers: Sit to/from Stand Sit to Stand: Supervision         General transfer comment: cues for hand placement  Ambulation/Gait Ambulation/Gait assistance: Min guard Ambulation Distance (Feet): 20 Feet Assistive device: Rolling walker (2 wheeled) Gait Pattern/deviations: Step-through  pattern;Antalgic Gait velocity: decreased   General Gait Details: Ambulated to bathroom and back.   Stairs            Wheelchair Mobility    Modified Rankin (Stroke Patients Only)       Balance             Standing balance-Leahy Scale: Fair Standing balance comment: able to wash hands at sink                    Cognition Arousal/Alertness: Awake/alert Behavior During Therapy: Anxious Overall Cognitive Status: Within Functional Limits for tasks assessed       Memory: Decreased short-term memory (Pt states no one has ever gone over therex with her. )              Exercises Total Joint Exercises Ankle Circles/Pumps: AROM;Both;20 reps Quad Sets: AROM;Right;10 reps Towel Squeeze: Strengthening;Both;10 reps;Seated Short Arc Quad: AROM;Right;10 reps;Supine Heel Slides: AAROM;Right;10 reps;Seated Hip ABduction/ADduction: AROM;AAROM;Right;10 reps;Seated Straight Leg Raises: Strengthening;5 reps;Right;Seated Other Exercises Other Exercises: therex performed in recliner    General Comments        Pertinent Vitals/Pain Pain Assessment: Faces Faces Pain Scale: Hurts little more Pain Location: R knee Pain Descriptors / Indicators: Operative site guarding Pain Intervention(s): Limited activity within patient's tolerance;Monitored during session;Premedicated before session    Home Living                      Prior Function  PT Goals (current goals can now be found in the care plan section) Acute Rehab PT Goals Patient Stated Goal: get back on the bicycle PT Goal Formulation: With patient Time For Goal Achievement: 12/23/15 Potential to Achieve Goals: Good Progress towards PT goals: Progressing toward goals    Frequency  7X/week    PT Plan Current plan remains appropriate    Co-evaluation             End of Session   Activity Tolerance: Patient limited by pain;Other (comment) (limited by high anxiety) Patient  left: in chair;with call bell/phone within reach;with family/visitor present     Time: [id number] PT Time Calculation (min) (ACUTE ONLY): 28 min  Charges:  $Therapeutic Exercise: 23-37 mins                    G Codes:      Ashmi Blas LUBECK 12/19/2015, 2:32 PM

## 2015-12-19 NOTE — Progress Notes (Signed)
Patient and patients sister came into the hallway. Patient states she is upset because she has been waiting on [facility] since before 1700. RN called [facility] and placed patient on the list for pickup. [facility] is currently at the bedside and transporting the patient to [facility].

## 2015-12-19 NOTE — Care Management Important Message (Signed)
Important Message  Patient Details  Name: Elizabeth Stone MRN: [id number] Date of Birth: [birth year]   Medicare Important Message Given:  Yes    Barb Merino Yusuf Yu 12/19/2015, 2:16 PM

## 2015-12-20 ENCOUNTER — Encounter: Payer: Self-pay | Admitting: Internal Medicine

## 2015-12-20 ENCOUNTER — Non-Acute Institutional Stay (SKILLED_NURSING_FACILITY): Payer: Medicare Other | Admitting: Internal Medicine

## 2015-12-20 DIAGNOSIS — F329 Major depressive disorder, single episode, unspecified: Secondary | ICD-10-CM | POA: Diagnosis not present

## 2015-12-20 DIAGNOSIS — R2681 Unsteadiness on feet: Secondary | ICD-10-CM

## 2015-12-20 DIAGNOSIS — K5901 Slow transit constipation: Secondary | ICD-10-CM

## 2015-12-20 DIAGNOSIS — I10 Essential (primary) hypertension: Secondary | ICD-10-CM

## 2015-12-20 DIAGNOSIS — E876 Hypokalemia: Secondary | ICD-10-CM

## 2015-12-20 DIAGNOSIS — M1711 Unilateral primary osteoarthritis, right knee: Secondary | ICD-10-CM | POA: Diagnosis not present

## 2015-12-20 DIAGNOSIS — D62 Acute posthemorrhagic anemia: Secondary | ICD-10-CM | POA: Diagnosis not present

## 2015-12-20 NOTE — Progress Notes (Signed)
LOCATION: [place]  PCP: Blanchie Serve, MD   Code Status: Full Code  Goals of care: Advanced Directive information Advanced Directives 12/17/2015  Does patient have an advance directive? Yes  Type of Paramedic of [place];Living will  Does patient want to make changes to advanced directive? No - Patient declined  Copy of advanced directive(s) in chart? No - copy requested     Extended Emergency Contact Information Primary Emergency Contact: Lynnell Jude [place] of [place] Phone: [phone] Mobile Phone: [phone] Relation: Son Secondary Emergency Contact: Enrique Sack [place] of [place] Phone: [phone] Relation: Daughter   Allergies  Allergen Reactions  . Latex Hives    Chief Complaint  Patient presents with  . New Admit To SNF    New Admission     HPI:  Patient is a 70 y.o. female seen today for short term rehabilitation post hospital admission from 12/16/15-12/19/15 with right knee OA. She underwent right knee arthroplasty. She is seen in her room today. She has a hearing aid. Her pain medication has been helpful. She has been constipated and had her last bowel movement almost a week back.   Review of Systems:  Constitutional: Negative for fever, chills, diaphoresis.  HENT: Negative for headache, congestion, nasal discharge. Eyes: Negative for blurred vision, double vision and discharge.  Respiratory: Negative for cough, shortness of breath and wheezing.   Cardiovascular: Negative for chest pain, palpitations, leg swelling.  Gastrointestinal: Negative for heartburn, nausea, vomiting, abdominal pain. Genitourinary: Negative for dysuria. Musculoskeletal: Negative for back pain, fall. Skin: Negative for itching, rash.  Neurological: Negative for dizziness. Psychiatric/Behavioral: Negative for depression   Past Medical History  Diagnosis Date  . Arthritis   . Anxiety   . Depression   . GERD  (gastroesophageal reflux disease)   . Meniere disease     takes HCTZ for her inner ear problem  . Osteoporosis   . Hearing impaired     has cochlear implant  . Mixed hyperlipidemia 09/27/2013  . Fibromyalgia   . HTN (hypertension)   . Primary localized osteoarthritis of right knee   . Bipolar disorder [facility])    Past Surgical History  Procedure Laterality Date  . Abdominal hysterectomy    . Bladder tack    . Cochlear implant      2007  . Hammer toe surgery      left foot  . Reverse shoulder arthroplasty Right 12/13/2014    dr Tamera Punt  . Reverse shoulder arthroplasty Right 12/13/2014    Procedure: REVERSE SHOULDER ARTHROPLASTY;  Surgeon: Nita Sells, MD;  Location: [facility];  Service: Orthopedics;  Laterality: Right;  Right reverse total shoulder replacement  . Colonoscopy    . Total knee arthroplasty Right 12/16/2015    Procedure: TOTAL KNEE ARTHROPLASTY;  Surgeon: Elsie Saas, MD;  Location: [place];  Service: Orthopedics;  Laterality: Right;   Social History:   reports that she quit smoking about 22 years ago. She has never used smokeless tobacco. She reports that she drinks about 6.0 oz of alcohol per week. She reports that she does not use illicit drugs.  Family History  Problem Relation Age of Onset  . Colon cancer Neg Hx   . Esophageal cancer Neg Hx   . Rectal cancer Neg Hx   . Stomach cancer Neg Hx   . Heart disease Mother   . Breast cancer Mother   . Cervical cancer Mother   . Diabetes Mother   .  Heart disease Father   . Diabetes Maternal Aunt     Medications:   Medication List       This list is accurate as of: 12/20/15 12:03 PM.  Always use your most recent med list.               aspirin 325 MG EC tablet  1 tab po BID for 30 days to prevent blood clots     atorvastatin 10 MG tablet  Commonly known as:  LIPITOR  Take 10 mg by mouth daily.     buPROPion 300 MG 24 hr tablet  Commonly known as:  WELLBUTRIN XL  Take 300 mg by mouth Daily.       cholecalciferol 1000 units tablet  Commonly known as:  VITAMIN D  Take 1,000 Units by mouth daily.     clonazePAM 0.5 MG tablet  Commonly known as:  KLONOPIN  Take 0.5 mg by mouth 3 (three) times daily as needed for anxiety.     CYMBALTA 60 MG capsule  Generic drug:  DULoxetine  Take 60 mg by mouth 2 (two) times daily.     docusate sodium 100 MG capsule  Commonly known as:  COLACE  1 tab 2 times a day while on narcotics.  STOOL SOFTENER     Fish Oil 1200 MG Caps  Take 1,200 mg by mouth daily.     hydrochlorothiazide 25 MG tablet  Commonly known as:  HYDRODIURIL  Take 25 mg by mouth Daily.     KLOR-CON M20 20 MEQ tablet  Generic drug:  potassium chloride SA  Take 20 mEq by mouth 2 (two) times daily.     lamoTRIgine 200 MG tablet  Commonly known as:  LAMICTAL  Take 200 mg by mouth daily.     montelukast 10 MG tablet  Commonly known as:  SINGULAIR  Take 10 mg by mouth at bedtime.     oxyCODONE 5 MG immediate release tablet  Commonly known as:  Oxy IR/ROXICODONE  Take 5 mg by mouth every 4 (four) hours as needed for severe pain (Take 1-2 tablets by mouth every 4 hours as needed for pain).     polyethylene glycol packet  Commonly known as:  MIRALAX / GLYCOLAX  17grams in 4 oz of water twice a day until bowel movement.  LAXITIVE.  Restart if two days since last bowel movement     SAPHRIS 10 MG Subl  Generic drug:  Asenapine Maleate  Take 10 mg by mouth At bedtime.     vitamin C 500 MG tablet  Commonly known as:  ASCORBIC ACID  Take 500 mg by mouth 2 (two) times daily.         Physical Exam: Filed Vitals:   12/20/15 1144  BP: 100/48  Pulse: 82  Temp: 96.9 F (36.1 C)  TempSrc: Oral  Resp: 18  Height: 5\' 3"  (1.6 m)  Weight: 141 lb (63.957 kg)  SpO2: 95%   Body mass index is 24.98 kg/(m^2).  General- elderly female, well built, in no acute distress Head- normocephalic, atraumatic Nose- no maxillary or frontal sinus tenderness, no nasal  discharge Throat- moist mucus membrane  Eyes- PERRLA, EOMI, no pallor, no icterus, no discharge, normal conjunctiva, normal sclera Neck- no cervical lymphadenopathy Cardiovascular- normal s1,s2, no murmur, trace edema Respiratory- bilateral clear to auscultation, no wheeze, no rhonchi, no crackles, no use of accessory muscles Abdomen- bowel sounds present, soft, non tender Musculoskeletal- able to move all 4 extremities, limited right knee  range of motion Neurological- alert and oriented to person, place and time Skin- warm and dry, right knee surgical incision with aquacel dressing Psychiatry- normal mood and affect    Labs reviewed: Basic Metabolic Panel:  Recent Labs  12/17/15 0819 12/18/15 0430 12/19/15 0400  NA 137 139 141  K 4.0 4.4 3.7  CL 104 103 106  CO2 25 27 27   GLUCOSE 162* 161* 102*  BUN 12 11 13   CREATININE 0.66 0.57 0.70  CALCIUM 9.3 9.7 9.2   Liver Function Tests:  Recent Labs  12/05/15 1343  AST 35  ALT 29  ALKPHOS 100  BILITOT 0.4  PROT 7.1  ALBUMIN 4.1   No results for input(s): LIPASE, AMYLASE in the last 8760 hours. No results for input(s): AMMONIA in the last 8760 hours. CBC:  Recent Labs  12/05/15 1343 12/17/15 0819 12/18/15 0430 12/19/15 12/19/15 0400  WBC 6.1 16.1* 14.8* 10.5 10.5  NEUTROABS 3.6  --   --   --   --   HGB 15.8* 11.7* 11.5*  --  11.2*  HCT 46.7* 33.6* 34.6*  --  34.2*  MCV 95.3 96.0 96.6  --  97.7  PLT 256 202 200  --  207   Cardiac Enzymes: No results for input(s): CKTOTAL, CKMB, CKMBINDEX, TROPONINI in the last 8760 hours. BNP: Invalid input(s): POCBNP CBG:  Recent Labs  12/16/15 1208  GLUCAP 74     Assessment/Plan  Unsteady gait Post surgical repair of right knee. Will have her work with physical therapy and occupational therapy team to help with gait training and muscle strengthening exercises.fall precautions. Skin care. Encourage to be out of bed.   Right knee OA S/p right knee arthroplasty.  Has f/u with orthopedics. discontinue celebrex for now. Continue aspirin 325 mg bid for dvt prophylaxis for 4 weeks. Continue oxyIR 5 mg 1-2 tab q4h prn pain. Will have patient work with PT/OT as tolerated to regain strength and restore function.  Fall precautions are in place.  Blood loss anemia Post op, monitor cbc  Constipation Discontinue colace. Start miralax 17 g bid for now with senna s bid and monitor. Provide dulcolax suppository 12/22/15 if no bowel movement by then.   HTN bp soft reading this am. Denies dizziness. Continue hctz 25 mg daily with holding parameters and check bp bid x 1 week.   Hypokalemia Continue kcl supplement, monitor bmp  Chronic depression Stable mood. Continue wellbutrin xl, lamictal and klonopin, no changes made   Goals of care: short term rehabilitation   Labs/tests ordered: cbc,cmp 12/23/15  Family/ staff Communication: reviewed care plan with patient and nursing supervisor    Blanchie Serve, MD Internal Medicine [place], [place] [address] Cell Phone (Monday-Friday 8 am - 5 pm): [phone] On Call: [phone] and follow prompts after 5 pm and on weekends Office Phone: [phone] Office Fax: [phone]

## 2015-12-23 DIAGNOSIS — H8101 Meniere's disease, right ear: Secondary | ICD-10-CM | POA: Diagnosis not present

## 2015-12-23 DIAGNOSIS — R262 Difficulty in walking, not elsewhere classified: Secondary | ICD-10-CM | POA: Diagnosis not present

## 2015-12-23 DIAGNOSIS — R2681 Unsteadiness on feet: Secondary | ICD-10-CM | POA: Diagnosis not present

## 2015-12-23 DIAGNOSIS — Z96652 Presence of left artificial knee joint: Secondary | ICD-10-CM | POA: Diagnosis not present

## 2015-12-23 DIAGNOSIS — Z96651 Presence of right artificial knee joint: Secondary | ICD-10-CM | POA: Diagnosis not present

## 2015-12-23 DIAGNOSIS — F410 Panic disorder [episodic paroxysmal anxiety] without agoraphobia: Secondary | ICD-10-CM | POA: Diagnosis not present

## 2015-12-23 DIAGNOSIS — F310 Bipolar disorder, current episode hypomanic: Secondary | ICD-10-CM | POA: Diagnosis not present

## 2015-12-23 DIAGNOSIS — I10 Essential (primary) hypertension: Secondary | ICD-10-CM | POA: Diagnosis not present

## 2015-12-23 DIAGNOSIS — G8929 Other chronic pain: Secondary | ICD-10-CM | POA: Diagnosis not present

## 2015-12-23 DIAGNOSIS — M797 Fibromyalgia: Secondary | ICD-10-CM | POA: Diagnosis not present

## 2015-12-23 DIAGNOSIS — M790 Rheumatism, unspecified: Secondary | ICD-10-CM | POA: Diagnosis not present

## 2015-12-23 DIAGNOSIS — M6281 Muscle weakness (generalized): Secondary | ICD-10-CM | POA: Diagnosis not present

## 2015-12-23 DIAGNOSIS — M25561 Pain in right knee: Secondary | ICD-10-CM | POA: Diagnosis not present

## 2015-12-23 DIAGNOSIS — K219 Gastro-esophageal reflux disease without esophagitis: Secondary | ICD-10-CM | POA: Diagnosis not present

## 2015-12-23 DIAGNOSIS — M6200 Separation of muscle (nontraumatic), unspecified site: Secondary | ICD-10-CM | POA: Diagnosis not present

## 2015-12-23 DIAGNOSIS — R600 Localized edema: Secondary | ICD-10-CM | POA: Diagnosis not present

## 2015-12-23 LAB — CBC AND DIFFERENTIAL
HCT: 35 % — AB (ref 36–46)
HEMOGLOBIN: 11.2 g/dL — AB (ref 12.0–16.0)
PLATELETS: 264 10*3/uL (ref 150–399)
WBC: 8.2 10*3/mL

## 2015-12-23 LAB — HEPATIC FUNCTION PANEL
ALK PHOS: 90 U/L (ref 25–125)
ALT: 16 U/L (ref 7–35)
AST: 21 U/L (ref 13–35)
Bilirubin, Total: 1.1 mg/dL

## 2015-12-23 LAB — BASIC METABOLIC PANEL
BUN: 14 mg/dL (ref 4–21)
CREATININE: 0.8 mg/dL (ref 0.5–1.1)
Glucose: 122 mg/dL
Potassium: 4 mmol/L (ref 3.4–5.3)
Sodium: 140 mmol/L (ref 137–147)

## 2015-12-24 ENCOUNTER — Other Ambulatory Visit: Payer: Self-pay

## 2015-12-24 DIAGNOSIS — M6281 Muscle weakness (generalized): Secondary | ICD-10-CM | POA: Diagnosis not present

## 2015-12-24 DIAGNOSIS — Z96651 Presence of right artificial knee joint: Secondary | ICD-10-CM | POA: Diagnosis not present

## 2015-12-24 DIAGNOSIS — M797 Fibromyalgia: Secondary | ICD-10-CM | POA: Diagnosis not present

## 2015-12-24 DIAGNOSIS — R2681 Unsteadiness on feet: Secondary | ICD-10-CM | POA: Diagnosis not present

## 2015-12-24 DIAGNOSIS — R262 Difficulty in walking, not elsewhere classified: Secondary | ICD-10-CM | POA: Diagnosis not present

## 2015-12-24 DIAGNOSIS — R600 Localized edema: Secondary | ICD-10-CM | POA: Diagnosis not present

## 2015-12-24 DIAGNOSIS — M25561 Pain in right knee: Secondary | ICD-10-CM | POA: Diagnosis not present

## 2015-12-24 DIAGNOSIS — G8929 Other chronic pain: Secondary | ICD-10-CM | POA: Diagnosis not present

## 2015-12-24 MED ORDER — OXYCODONE HCL 5 MG PO TABS: 5.0000 mg | ORAL_TABLET | ORAL | Status: DC | PRN

## 2015-12-24 NOTE — Telephone Encounter (Signed)
Prescription printed and placed in Dr. Hervey Ard folder for signing.

## 2015-12-25 DIAGNOSIS — R262 Difficulty in walking, not elsewhere classified: Secondary | ICD-10-CM | POA: Diagnosis not present

## 2015-12-25 DIAGNOSIS — M6281 Muscle weakness (generalized): Secondary | ICD-10-CM | POA: Diagnosis not present

## 2015-12-25 DIAGNOSIS — G8929 Other chronic pain: Secondary | ICD-10-CM | POA: Diagnosis not present

## 2015-12-25 DIAGNOSIS — Z96651 Presence of right artificial knee joint: Secondary | ICD-10-CM | POA: Diagnosis not present

## 2015-12-25 DIAGNOSIS — M797 Fibromyalgia: Secondary | ICD-10-CM | POA: Diagnosis not present

## 2015-12-25 DIAGNOSIS — M25561 Pain in right knee: Secondary | ICD-10-CM | POA: Diagnosis not present

## 2015-12-25 DIAGNOSIS — R600 Localized edema: Secondary | ICD-10-CM | POA: Diagnosis not present

## 2015-12-25 DIAGNOSIS — R2681 Unsteadiness on feet: Secondary | ICD-10-CM | POA: Diagnosis not present

## 2015-12-26 DIAGNOSIS — R600 Localized edema: Secondary | ICD-10-CM | POA: Diagnosis not present

## 2015-12-26 DIAGNOSIS — M797 Fibromyalgia: Secondary | ICD-10-CM | POA: Diagnosis not present

## 2015-12-26 DIAGNOSIS — R262 Difficulty in walking, not elsewhere classified: Secondary | ICD-10-CM | POA: Diagnosis not present

## 2015-12-26 DIAGNOSIS — Z96651 Presence of right artificial knee joint: Secondary | ICD-10-CM | POA: Diagnosis not present

## 2015-12-26 DIAGNOSIS — M25561 Pain in right knee: Secondary | ICD-10-CM | POA: Diagnosis not present

## 2015-12-26 DIAGNOSIS — R2681 Unsteadiness on feet: Secondary | ICD-10-CM | POA: Diagnosis not present

## 2015-12-26 DIAGNOSIS — E0821 Diabetes mellitus due to underlying condition with diabetic nephropathy: Secondary | ICD-10-CM | POA: Diagnosis not present

## 2015-12-26 DIAGNOSIS — M6281 Muscle weakness (generalized): Secondary | ICD-10-CM | POA: Diagnosis not present

## 2015-12-26 DIAGNOSIS — G8929 Other chronic pain: Secondary | ICD-10-CM | POA: Diagnosis not present

## 2015-12-26 LAB — HEMOGLOBIN A1C: HEMOGLOBIN A1C: 5

## 2015-12-27 DIAGNOSIS — M25561 Pain in right knee: Secondary | ICD-10-CM | POA: Diagnosis not present

## 2015-12-27 DIAGNOSIS — Z96651 Presence of right artificial knee joint: Secondary | ICD-10-CM | POA: Diagnosis not present

## 2015-12-27 DIAGNOSIS — R262 Difficulty in walking, not elsewhere classified: Secondary | ICD-10-CM | POA: Diagnosis not present

## 2015-12-27 DIAGNOSIS — R600 Localized edema: Secondary | ICD-10-CM | POA: Diagnosis not present

## 2015-12-27 DIAGNOSIS — M797 Fibromyalgia: Secondary | ICD-10-CM | POA: Diagnosis not present

## 2015-12-27 DIAGNOSIS — G8929 Other chronic pain: Secondary | ICD-10-CM | POA: Diagnosis not present

## 2015-12-27 DIAGNOSIS — M6281 Muscle weakness (generalized): Secondary | ICD-10-CM | POA: Diagnosis not present

## 2015-12-27 DIAGNOSIS — R2681 Unsteadiness on feet: Secondary | ICD-10-CM | POA: Diagnosis not present

## 2015-12-30 DIAGNOSIS — R600 Localized edema: Secondary | ICD-10-CM | POA: Diagnosis not present

## 2015-12-30 DIAGNOSIS — Z96651 Presence of right artificial knee joint: Secondary | ICD-10-CM | POA: Diagnosis not present

## 2015-12-30 DIAGNOSIS — R2681 Unsteadiness on feet: Secondary | ICD-10-CM | POA: Diagnosis not present

## 2015-12-30 DIAGNOSIS — G8929 Other chronic pain: Secondary | ICD-10-CM | POA: Diagnosis not present

## 2015-12-30 DIAGNOSIS — R262 Difficulty in walking, not elsewhere classified: Secondary | ICD-10-CM | POA: Diagnosis not present

## 2015-12-30 DIAGNOSIS — M797 Fibromyalgia: Secondary | ICD-10-CM | POA: Diagnosis not present

## 2015-12-30 DIAGNOSIS — M6281 Muscle weakness (generalized): Secondary | ICD-10-CM | POA: Diagnosis not present

## 2015-12-30 DIAGNOSIS — M25561 Pain in right knee: Secondary | ICD-10-CM | POA: Diagnosis not present

## 2015-12-31 DIAGNOSIS — R2681 Unsteadiness on feet: Secondary | ICD-10-CM | POA: Diagnosis not present

## 2015-12-31 DIAGNOSIS — Z96651 Presence of right artificial knee joint: Secondary | ICD-10-CM | POA: Diagnosis not present

## 2015-12-31 DIAGNOSIS — R262 Difficulty in walking, not elsewhere classified: Secondary | ICD-10-CM | POA: Diagnosis not present

## 2015-12-31 DIAGNOSIS — M25561 Pain in right knee: Secondary | ICD-10-CM | POA: Diagnosis not present

## 2015-12-31 DIAGNOSIS — M797 Fibromyalgia: Secondary | ICD-10-CM | POA: Diagnosis not present

## 2015-12-31 DIAGNOSIS — R600 Localized edema: Secondary | ICD-10-CM | POA: Diagnosis not present

## 2015-12-31 DIAGNOSIS — G8929 Other chronic pain: Secondary | ICD-10-CM | POA: Diagnosis not present

## 2015-12-31 DIAGNOSIS — M6281 Muscle weakness (generalized): Secondary | ICD-10-CM | POA: Diagnosis not present

## 2016-01-01 ENCOUNTER — Non-Acute Institutional Stay (SKILLED_NURSING_FACILITY): Payer: Medicare Other | Admitting: Adult Health

## 2016-01-01 ENCOUNTER — Encounter: Payer: Self-pay | Admitting: Adult Health

## 2016-01-01 DIAGNOSIS — D62 Acute posthemorrhagic anemia: Secondary | ICD-10-CM

## 2016-01-01 DIAGNOSIS — I10 Essential (primary) hypertension: Secondary | ICD-10-CM | POA: Diagnosis not present

## 2016-01-01 DIAGNOSIS — M6281 Muscle weakness (generalized): Secondary | ICD-10-CM | POA: Diagnosis not present

## 2016-01-01 DIAGNOSIS — M797 Fibromyalgia: Secondary | ICD-10-CM | POA: Diagnosis not present

## 2016-01-01 DIAGNOSIS — E876 Hypokalemia: Secondary | ICD-10-CM | POA: Diagnosis not present

## 2016-01-01 DIAGNOSIS — R2681 Unsteadiness on feet: Secondary | ICD-10-CM | POA: Diagnosis not present

## 2016-01-01 DIAGNOSIS — K5901 Slow transit constipation: Secondary | ICD-10-CM

## 2016-01-01 DIAGNOSIS — F419 Anxiety disorder, unspecified: Secondary | ICD-10-CM | POA: Diagnosis not present

## 2016-01-01 DIAGNOSIS — Z96651 Presence of right artificial knee joint: Secondary | ICD-10-CM | POA: Diagnosis not present

## 2016-01-01 DIAGNOSIS — R262 Difficulty in walking, not elsewhere classified: Secondary | ICD-10-CM | POA: Diagnosis not present

## 2016-01-01 DIAGNOSIS — G8929 Other chronic pain: Secondary | ICD-10-CM | POA: Diagnosis not present

## 2016-01-01 DIAGNOSIS — M1711 Unilateral primary osteoarthritis, right knee: Secondary | ICD-10-CM | POA: Diagnosis not present

## 2016-01-01 DIAGNOSIS — F329 Major depressive disorder, single episode, unspecified: Secondary | ICD-10-CM

## 2016-01-01 DIAGNOSIS — E785 Hyperlipidemia, unspecified: Secondary | ICD-10-CM

## 2016-01-01 DIAGNOSIS — R600 Localized edema: Secondary | ICD-10-CM | POA: Diagnosis not present

## 2016-01-01 DIAGNOSIS — M25561 Pain in right knee: Secondary | ICD-10-CM | POA: Diagnosis not present

## 2016-01-01 NOTE — Progress Notes (Signed)
Patient ID: Elizabeth Stone, female   DOB: July 15, 1946, 70 y.o.   MRN: [id number]    DATE:    01/01/16   MRN:  [id number]  BIRTHDAY: 1945-10-26  Facility:  Nursing Home Location:  [place] Room Number: [id number]  LEVEL OF CARE:  SNF [phone])  Contact Information    Name Relation Home Work Mobile   Ravinia Son [phone]  [phone]   Dorthy Cooler Daughter [phone]     Jacqualine Code [id number]     Ardine Bjork 0000000  [phone]       Code Status History    Date Active Date Inactive Code Status Order ID Comments User Context   12/13/2014 12:39 PM 12/15/2014  5:42 PM Full Code [id number]  Grier Mitts, PA-C Inpatient       Chief Complaint  Patient presents with  . Discharge Note    HISTORY OF PRESENT ILLNESS:   This is a 70 year old female who is for discharge home with Home health PT for endurance, OT for ADLs, CNA for showers and Nursing for medication management. She has been admitted to [facility] on 12/19/15 from [facility] with right knee osteoarthritis for which she had right knee arthroplasty.  Patient was admitted to this facility for short-term rehabilitation after the patient's recent hospitalization.  Patient has completed SNF rehabilitation and therapy has cleared the patient for discharge.  PAST MEDICAL HISTORY:  Past Medical History  Diagnosis Date  . Arthritis   . Anxiety   . Depression   . GERD (gastroesophageal reflux disease)   . Meniere disease     takes HCTZ for her inner ear problem  . Osteoporosis   . Hearing impaired     has cochlear implant  . Mixed hyperlipidemia 09/27/2013  . Fibromyalgia   . HTN (hypertension)   . Primary localized osteoarthritis of right knee   . Bipolar disorder ([place])   . Unsteady gait   . Acute blood loss anemia   . Slow transit constipation   . Hypokalemia      CURRENT MEDICATIONS: Reviewed  Patient's Medications  New Prescriptions   No  medications on file  Previous Medications   ASPIRIN EC 325 MG EC TABLET    1 tab po BID for 30 days to prevent blood clots   ATORVASTATIN (LIPITOR) 10 MG TABLET    Take 10 mg by mouth daily.   BUPROPION (WELLBUTRIN XL) 300 MG 24 HR TABLET    Take 300 mg by mouth Daily.    CALCIUM CARBONATE (OS-CAL) 600 MG TABS TABLET    Take 1,200 mg by mouth 2 (two) times daily with a meal. Give two (2) tablets QD to = 1200 mg BID for supplement   CELECOXIB (CELEBREX) 200 MG CAPSULE    Take 200 mg by mouth daily.   CHOLECALCIFEROL (VITAMIN D-3 PO)    Take 1,000 Units by mouth daily.   CLONAZEPAM (KLONOPIN) 0.5 MG TABLET    Take 0.5-1 mg by mouth 3 (three) times daily as needed for anxiety. Give 1/2 to 1 tab TID PRN   CYMBALTA 60 MG CAPSULE    Take 60 mg by mouth 2 (two) times daily.    HYDROCHLOROTHIAZIDE (HYDRODIURIL) 25 MG TABLET    Take 25 mg by mouth Daily.    KLOR-CON M20 20 MEQ TABLET    Take 20 mEq by mouth 2 (two) times daily.    LAMOTRIGINE (LAMICTAL) 200 MG TABLET    Take 200 mg by  mouth daily.    MONTELUKAST (SINGULAIR) 10 MG TABLET    Take 10 mg by mouth at bedtime.    OMEGA-3 FATTY ACIDS (FISH OIL) 1200 MG CAPS    Take 1,200 mg by mouth daily.    OXYCODONE (OXY IR/ROXICODONE) 5 MG IMMEDIATE RELEASE TABLET    Take 1 tablet (5 mg total) by mouth every 4 (four) hours as needed for severe pain (Take 1-2 tablets by mouth every 4 hours as needed for pain).   POLYETHYLENE GLYCOL (MIRALAX / GLYCOLAX) PACKET    Take 17 g by mouth 2 (two) times daily.   SAPHRIS 10 MG SUBL    Take 10 mg by mouth At bedtime.    SENNOSIDES-DOCUSATE SODIUM (SENOKOT-S) 8.6-50 MG TABLET    Take 2 tablets by mouth 2 (two) times daily.   VITAMIN C (ASCORBIC ACID) 500 MG TABLET    Take 500 mg by mouth 2 (two) times daily.  Modified Medications   No medications on file  Discontinued Medications   CHOLECALCIFEROL (VITAMIN D) 1000 UNITS TABLET    Take 1,000 Units by mouth daily.   DOCUSATE SODIUM (COLACE) 100 MG CAPSULE    1 tab 2  times a day while on narcotics.  STOOL SOFTENER   POLYETHYLENE GLYCOL (MIRALAX / GLYCOLAX) PACKET    17grams in 4 oz of water twice a day until bowel movement.  LAXITIVE.  Restart if two days since last bowel movement     Allergies  Allergen Reactions  . Latex Hives     REVIEW OF SYSTEMS:  GENERAL: no change in appetite, no fatigue, no weight changes, no fever, chills or weakness EYES: Denies change in vision, dry eyes, eye pain, itching or discharge EARS: Denies change in hearing, ringing in ears, or earache NOSE: Denies nasal congestion or epistaxis MOUTH and THROAT: Denies oral discomfort, gingival pain or bleeding, pain from teeth or hoarseness   RESPIRATORY: no cough, SOB, DOE, wheezing, hemoptysis CARDIAC: no chest pain, edema or palpitations GI: no abdominal pain, diarrhea, constipation, heart burn, nausea or vomiting GU: Denies dysuria, frequency, hematuria, incontinence, or discharge PSYCHIATRIC: Denies feeling of depression or anxiety. No report of hallucinations, insomnia, paranoia, or agitation   PHYSICAL EXAMINATION  GENERAL APPEARANCE: Well nourished. In no acute distress. Normal body habitus SKIN:  Right knee surgical incision is healed, no erythema HEAD: Normal in size and contour. No evidence of trauma EYES: Lids open and close normally. No blepharitis, entropion or ectropion. PERRL. Conjunctivae are clear and sclerae are white. Lenses are without opacity EARS: Pinnae are normal. She has a hearing implant on right side MOUTH and THROAT: Lips are without lesions. Oral mucosa is moist and without lesions. Tongue is normal in shape, size, and color and without lesions NECK: supple, trachea midline, no neck masses, no thyroid tenderness, no thyromegaly LYMPHATICS: no LAN in the neck, no supraclavicular LAN RESPIRATORY: breathing is even & unlabored, BS CTAB CARDIAC: RRR, no murmur,no extra heart sounds, no edema GI: abdomen soft, normal BS, no masses, no tenderness,  no hepatomegaly, no splenomegaly EXTREMITIES:  Able to move X 4 extremities PSYCHIATRIC: Alert and oriented X 3. Affect and behavior are appropriate  LABS/RADIOLOGY: Labs reviewed: Basic Metabolic Panel:  Recent Labs  12/17/15 0819 12/18/15 0430 12/19/15 0400 12/23/15  NA 137 139 141 140  K 4.0 4.4 3.7 4.0  CL 104 103 106  --   CO2 25 27 27   --   GLUCOSE 162* 161* 102*  --   BUN 12  11 13 14   CREATININE 0.66 0.57 0.70 0.8  CALCIUM 9.3 9.7 9.2  --    Liver Function Tests:  Recent Labs  12/05/15 1343 12/23/15  AST 35 21  ALT 29 16  ALKPHOS 100 90  BILITOT 0.4  --   PROT 7.1  --   ALBUMIN 4.1  --     CBC:  Recent Labs  12/05/15 1343 12/17/15 0819 12/18/15 0430 12/19/15 12/19/15 0400 12/23/15  WBC 6.1 16.1* 14.8* 10.5 10.5 8.2  NEUTROABS 3.6  --   --   --   --   --   HGB 15.8* 11.7* 11.5*  --  11.2* 11.2*  HCT 46.7* 33.6* 34.6*  --  34.2* 35*  MCV 95.3 96.0 96.6  --  97.7  --   PLT 256 202 200  --  207 264   CBG:  Recent Labs  12/16/15 1208  GLUCAP 74     ASSESSMENT/PLAN:  Right knee OA S/P right total knee arthroplasty -  For Home health PT, OT, CNA and Nursing; continueCyclic 05 mg 1-2 tabs by mouth every 4 hours when necessary for pain; aspirin 325 mg 1 tab by mouth twice a day 2 weeks for DVT prophylaxis; Celebrex 200 mg daily; follow-up with orthopedic surgeon, Dr. Noemi Chapel  Blood loss anemia - recheck hgb 11.2, stable  Constipation - continue MiraLAX 17 g by mouth twice a day and senna S 2 tabs by mouth twice a day   Hypertension -  Continue HCTZ 25 mg 1 tab daily  Hypokalemia - K 4.0; continue KCL supplementation  Chronic depression  - mood is stable; continue Lamictal 200 mg daily, Wellbutrin XL 300 mg daily, Saphris 1 tab SL Q HS and Cymbalta 60 mg 1 capsulre BID  Anxiety - continue Klonopin 0.5 mg take 1-2 tabs PO TID PRN  Hyperlipidemia - continue Lipitor 10 mg daily     I have filled out patient's discharge paperwork and written  prescriptions.  Patient will receive home health PT, OT, Nursing and CNA.  Total discharge time: Greater than 30 minutes  Discharge time involved coordination of the discharge process with social worker, nursing staff and therapy department. Medical justification for home health services verified.    [facility], NP [employer] [phone]

## 2016-01-02 DIAGNOSIS — M6281 Muscle weakness (generalized): Secondary | ICD-10-CM | POA: Diagnosis not present

## 2016-01-02 DIAGNOSIS — M25561 Pain in right knee: Secondary | ICD-10-CM | POA: Diagnosis not present

## 2016-01-02 DIAGNOSIS — G8929 Other chronic pain: Secondary | ICD-10-CM | POA: Diagnosis not present

## 2016-01-02 DIAGNOSIS — R2681 Unsteadiness on feet: Secondary | ICD-10-CM | POA: Diagnosis not present

## 2016-01-02 DIAGNOSIS — Z96651 Presence of right artificial knee joint: Secondary | ICD-10-CM | POA: Diagnosis not present

## 2016-01-02 DIAGNOSIS — R262 Difficulty in walking, not elsewhere classified: Secondary | ICD-10-CM | POA: Diagnosis not present

## 2016-01-02 DIAGNOSIS — R600 Localized edema: Secondary | ICD-10-CM | POA: Diagnosis not present

## 2016-01-02 DIAGNOSIS — M797 Fibromyalgia: Secondary | ICD-10-CM | POA: Diagnosis not present

## 2016-01-03 DIAGNOSIS — M6281 Muscle weakness (generalized): Secondary | ICD-10-CM | POA: Diagnosis not present

## 2016-01-03 DIAGNOSIS — Z96651 Presence of right artificial knee joint: Secondary | ICD-10-CM | POA: Diagnosis not present

## 2016-01-03 DIAGNOSIS — M797 Fibromyalgia: Secondary | ICD-10-CM | POA: Diagnosis not present

## 2016-01-03 DIAGNOSIS — R600 Localized edema: Secondary | ICD-10-CM | POA: Diagnosis not present

## 2016-01-03 DIAGNOSIS — R262 Difficulty in walking, not elsewhere classified: Secondary | ICD-10-CM | POA: Diagnosis not present

## 2016-01-03 DIAGNOSIS — R2681 Unsteadiness on feet: Secondary | ICD-10-CM | POA: Diagnosis not present

## 2016-01-03 DIAGNOSIS — G8929 Other chronic pain: Secondary | ICD-10-CM | POA: Diagnosis not present

## 2016-01-03 DIAGNOSIS — M25561 Pain in right knee: Secondary | ICD-10-CM | POA: Diagnosis not present

## 2016-01-07 DIAGNOSIS — F329 Major depressive disorder, single episode, unspecified: Secondary | ICD-10-CM | POA: Diagnosis not present

## 2016-01-07 DIAGNOSIS — M797 Fibromyalgia: Secondary | ICD-10-CM | POA: Diagnosis not present

## 2016-01-07 DIAGNOSIS — M810 Age-related osteoporosis without current pathological fracture: Secondary | ICD-10-CM | POA: Diagnosis not present

## 2016-01-07 DIAGNOSIS — I10 Essential (primary) hypertension: Secondary | ICD-10-CM | POA: Diagnosis not present

## 2016-01-07 DIAGNOSIS — M1990 Unspecified osteoarthritis, unspecified site: Secondary | ICD-10-CM | POA: Diagnosis not present

## 2016-01-07 DIAGNOSIS — Z471 Aftercare following joint replacement surgery: Secondary | ICD-10-CM | POA: Diagnosis not present

## 2016-01-21 DIAGNOSIS — H43811 Vitreous degeneration, right eye: Secondary | ICD-10-CM | POA: Diagnosis not present

## 2016-01-21 DIAGNOSIS — H2511 Age-related nuclear cataract, right eye: Secondary | ICD-10-CM | POA: Diagnosis not present

## 2016-01-21 DIAGNOSIS — H04123 Dry eye syndrome of bilateral lacrimal glands: Secondary | ICD-10-CM | POA: Diagnosis not present

## 2016-01-21 DIAGNOSIS — H40013 Open angle with borderline findings, low risk, bilateral: Secondary | ICD-10-CM | POA: Diagnosis not present

## 2016-01-28 DIAGNOSIS — Z471 Aftercare following joint replacement surgery: Secondary | ICD-10-CM | POA: Diagnosis not present

## 2016-01-28 DIAGNOSIS — M25561 Pain in right knee: Secondary | ICD-10-CM | POA: Diagnosis not present

## 2016-01-28 DIAGNOSIS — R262 Difficulty in walking, not elsewhere classified: Secondary | ICD-10-CM | POA: Diagnosis not present

## 2016-01-28 DIAGNOSIS — Z96651 Presence of right artificial knee joint: Secondary | ICD-10-CM | POA: Diagnosis not present

## 2016-02-04 DIAGNOSIS — M25561 Pain in right knee: Secondary | ICD-10-CM | POA: Diagnosis not present

## 2016-02-04 DIAGNOSIS — Z96651 Presence of right artificial knee joint: Secondary | ICD-10-CM | POA: Diagnosis not present

## 2016-02-04 DIAGNOSIS — R262 Difficulty in walking, not elsewhere classified: Secondary | ICD-10-CM | POA: Diagnosis not present

## 2016-02-04 DIAGNOSIS — Z471 Aftercare following joint replacement surgery: Secondary | ICD-10-CM | POA: Diagnosis not present

## 2016-02-05 DIAGNOSIS — H8109 Meniere's disease, unspecified ear: Secondary | ICD-10-CM | POA: Diagnosis not present

## 2016-02-05 DIAGNOSIS — Z872 Personal history of diseases of the skin and subcutaneous tissue: Secondary | ICD-10-CM | POA: Diagnosis not present

## 2016-02-05 DIAGNOSIS — Z0000 Encounter for general adult medical examination without abnormal findings: Secondary | ICD-10-CM | POA: Diagnosis not present

## 2016-02-05 DIAGNOSIS — E559 Vitamin D deficiency, unspecified: Secondary | ICD-10-CM | POA: Diagnosis not present

## 2016-02-05 DIAGNOSIS — L720 Epidermal cyst: Secondary | ICD-10-CM | POA: Diagnosis not present

## 2016-02-05 DIAGNOSIS — L821 Other seborrheic keratosis: Secondary | ICD-10-CM | POA: Diagnosis not present

## 2016-02-05 DIAGNOSIS — M1990 Unspecified osteoarthritis, unspecified site: Secondary | ICD-10-CM | POA: Diagnosis not present

## 2016-02-05 DIAGNOSIS — M2550 Pain in unspecified joint: Secondary | ICD-10-CM | POA: Diagnosis not present

## 2016-02-05 DIAGNOSIS — F319 Bipolar disorder, unspecified: Secondary | ICD-10-CM | POA: Diagnosis not present

## 2016-02-05 DIAGNOSIS — D2230 Melanocytic nevi of unspecified part of face: Secondary | ICD-10-CM | POA: Diagnosis not present

## 2016-02-05 DIAGNOSIS — R945 Abnormal results of liver function studies: Secondary | ICD-10-CM | POA: Diagnosis not present

## 2016-02-05 DIAGNOSIS — R7301 Impaired fasting glucose: Secondary | ICD-10-CM | POA: Diagnosis not present

## 2016-02-05 DIAGNOSIS — E782 Mixed hyperlipidemia: Secondary | ICD-10-CM | POA: Diagnosis not present

## 2016-02-05 DIAGNOSIS — Z131 Encounter for screening for diabetes mellitus: Secondary | ICD-10-CM | POA: Diagnosis not present

## 2016-02-05 DIAGNOSIS — M8580 Other specified disorders of bone density and structure, unspecified site: Secondary | ICD-10-CM | POA: Diagnosis not present

## 2016-02-05 DIAGNOSIS — Z23 Encounter for immunization: Secondary | ICD-10-CM | POA: Diagnosis not present

## 2016-02-06 DIAGNOSIS — Z96651 Presence of right artificial knee joint: Secondary | ICD-10-CM | POA: Diagnosis not present

## 2016-02-06 DIAGNOSIS — M25561 Pain in right knee: Secondary | ICD-10-CM | POA: Diagnosis not present

## 2016-02-06 DIAGNOSIS — Z471 Aftercare following joint replacement surgery: Secondary | ICD-10-CM | POA: Diagnosis not present

## 2016-02-06 DIAGNOSIS — R262 Difficulty in walking, not elsewhere classified: Secondary | ICD-10-CM | POA: Diagnosis not present

## 2016-02-12 DIAGNOSIS — Z96651 Presence of right artificial knee joint: Secondary | ICD-10-CM | POA: Diagnosis not present

## 2016-02-12 DIAGNOSIS — M19212 Secondary osteoarthritis, left shoulder: Secondary | ICD-10-CM | POA: Diagnosis not present

## 2016-02-12 DIAGNOSIS — R262 Difficulty in walking, not elsewhere classified: Secondary | ICD-10-CM | POA: Diagnosis not present

## 2016-02-12 DIAGNOSIS — Z96611 Presence of right artificial shoulder joint: Secondary | ICD-10-CM | POA: Diagnosis not present

## 2016-02-12 DIAGNOSIS — M25561 Pain in right knee: Secondary | ICD-10-CM | POA: Diagnosis not present

## 2016-02-12 DIAGNOSIS — Z471 Aftercare following joint replacement surgery: Secondary | ICD-10-CM | POA: Diagnosis not present

## 2016-02-20 DIAGNOSIS — Z96651 Presence of right artificial knee joint: Secondary | ICD-10-CM | POA: Diagnosis not present

## 2016-02-20 DIAGNOSIS — Z471 Aftercare following joint replacement surgery: Secondary | ICD-10-CM | POA: Diagnosis not present

## 2016-02-20 DIAGNOSIS — R262 Difficulty in walking, not elsewhere classified: Secondary | ICD-10-CM | POA: Diagnosis not present

## 2016-02-20 DIAGNOSIS — M25561 Pain in right knee: Secondary | ICD-10-CM | POA: Diagnosis not present

## 2016-02-26 DIAGNOSIS — M5416 Radiculopathy, lumbar region: Secondary | ICD-10-CM | POA: Diagnosis not present

## 2016-02-26 DIAGNOSIS — M545 Low back pain: Secondary | ICD-10-CM | POA: Diagnosis not present

## 2016-02-27 ENCOUNTER — Other Ambulatory Visit: Payer: Self-pay | Admitting: Physical Medicine and Rehabilitation

## 2016-02-27 DIAGNOSIS — M545 Low back pain, unspecified: Secondary | ICD-10-CM

## 2016-02-27 DIAGNOSIS — M79604 Pain in right leg: Secondary | ICD-10-CM

## 2016-03-03 ENCOUNTER — Other Ambulatory Visit: Payer: Medicare Other

## 2016-03-04 ENCOUNTER — Other Ambulatory Visit: Payer: Self-pay | Admitting: Physical Medicine and Rehabilitation

## 2016-03-04 DIAGNOSIS — M129 Arthropathy, unspecified: Secondary | ICD-10-CM

## 2016-03-05 ENCOUNTER — Ambulatory Visit
Admission: RE | Admit: 2016-03-05 | Discharge: 2016-03-05 | Disposition: A | Discharge status: Home / Self Care | Payer: Medicare Other | Source: Ambulatory Visit | Attending: Physical Medicine and Rehabilitation | Admitting: Physical Medicine and Rehabilitation

## 2016-03-05 ENCOUNTER — Other Ambulatory Visit: Payer: Medicare Other

## 2016-03-05 DIAGNOSIS — M545 Low back pain, unspecified: Secondary | ICD-10-CM

## 2016-03-05 DIAGNOSIS — R102 Pelvic and perineal pain: Secondary | ICD-10-CM | POA: Diagnosis not present

## 2016-03-05 DIAGNOSIS — M79604 Pain in right leg: Secondary | ICD-10-CM

## 2016-03-05 DIAGNOSIS — M129 Arthropathy, unspecified: Secondary | ICD-10-CM

## 2016-03-05 DIAGNOSIS — M4806 Spinal stenosis, lumbar region: Secondary | ICD-10-CM | POA: Diagnosis not present

## 2016-03-05 IMAGING — CT CT L SPINE W/O CM
4 of 12 series · 10 of 33 positions shown, 11 images · non-contrast
Comparison: Lumbar spine radiographs [DATE]. Abdominal pelvic
CT [DATE]

CLINICAL DATA: Low back pain with bilateral leg and pelvic pain for
several years. Remote fall. No recent injury or prior relevant
surgery. Previous hysterectomy and bladder tacking.

EXAM:
CT LUMBAR SPINE WITHOUT CONTRAST
TECHNIQUE: Multidetector CT imaging of the lumbar spine was performed without
intravenous contrast administration. Multiplanar CT image
reconstructions were also generated.

[Series 4: l spine bone · axial · 0.29mm/px · z∈[-23,+39]mm · 2 of 76 slices shown, 3 images]
[im 26/76  soft-tissue]
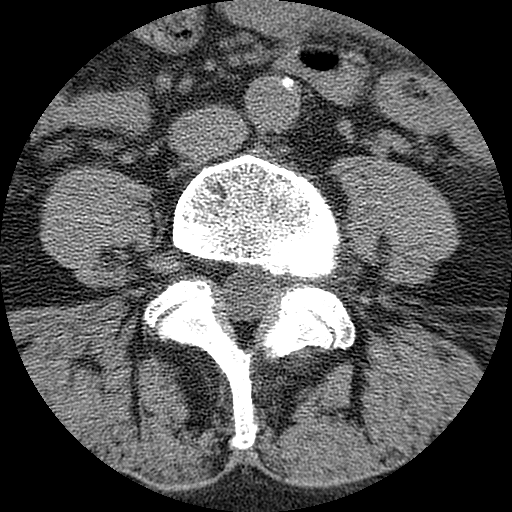
[im 26/76  bone]
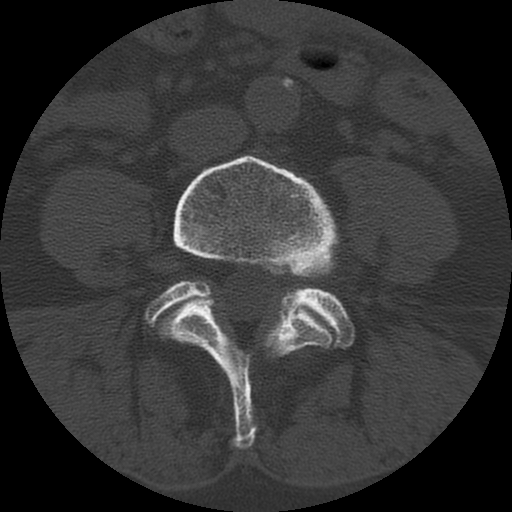
[im 51/76  bone]
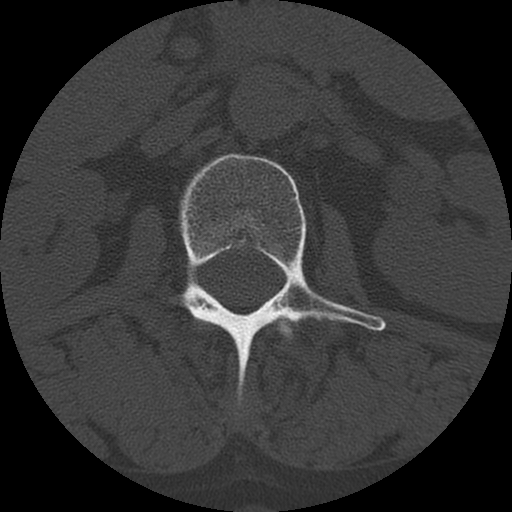

[Series 5: l spine detail · axial · 0.29mm/px · z∈[-23,+39]mm · 2 of 76 slices shown]
[im 26/76  bone]
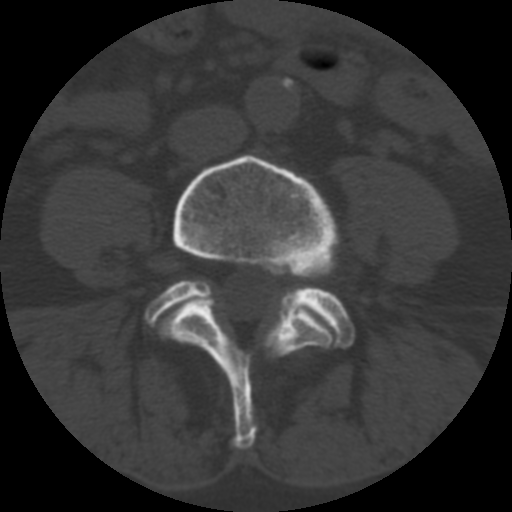
[im 51/76  bone]
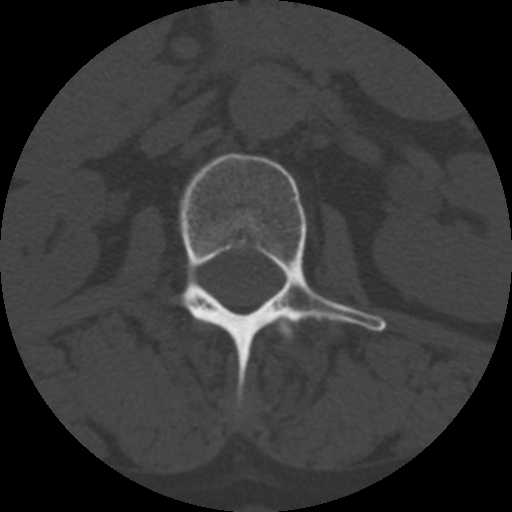

[Series 200: cor · coronal · 0.38mm/px · 1 of 56 slices shown]
[im 28/56  bone]
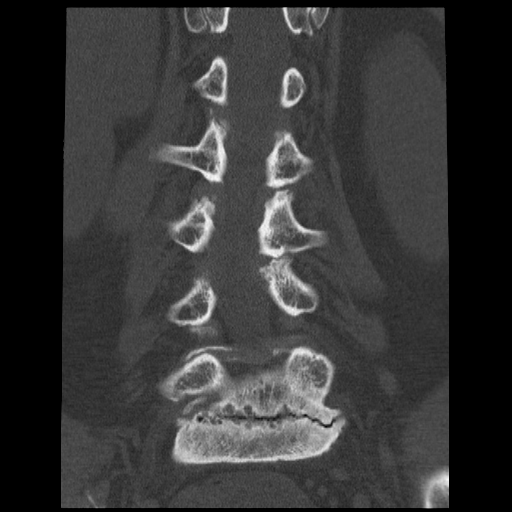

[Series 202: sag · sagittal · 0.38mm/px · 5 of 56 slices shown]
[im 10/56  bone]
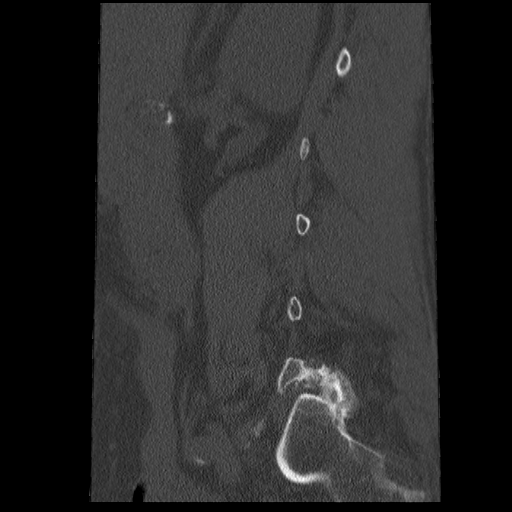
[im 19/56  bone]
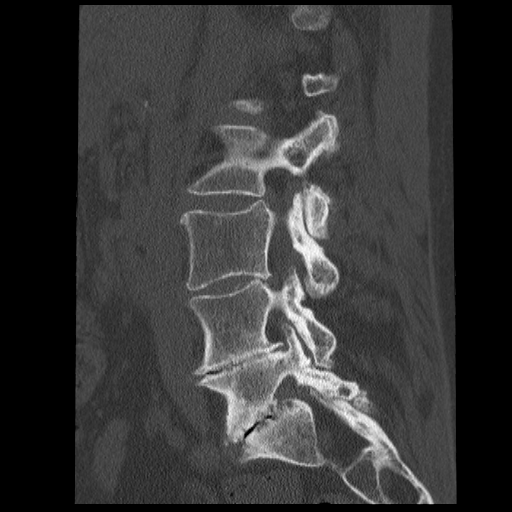
[im 28/56  bone]
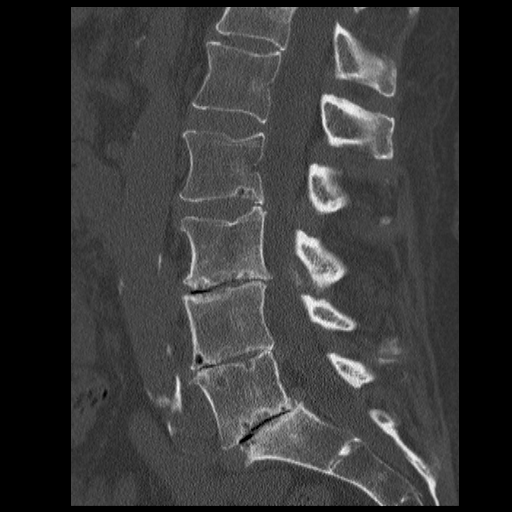
[im 37/56  bone]
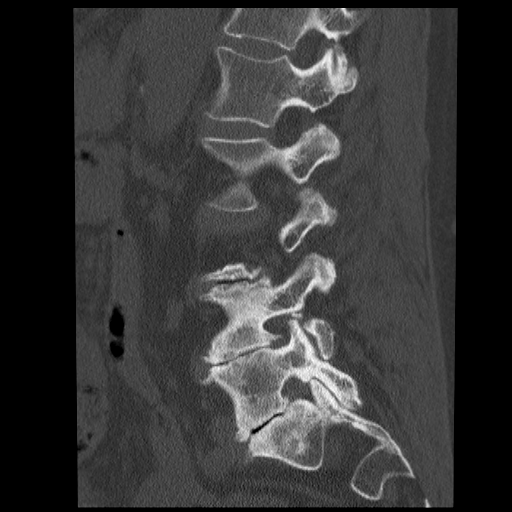
[im 46/56  bone]
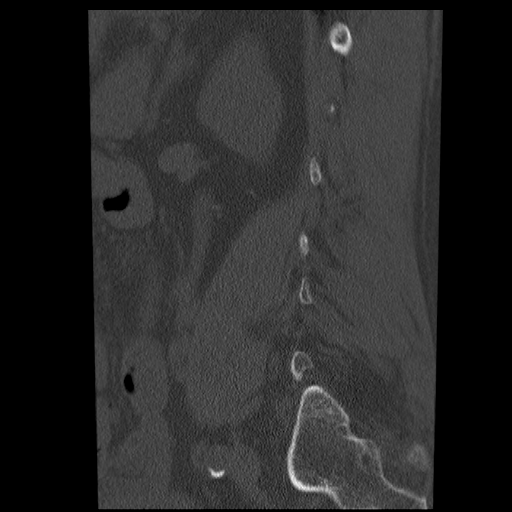

[10 of 33 positions shown; findings below may reference images not displayed]

FINDINGS: Segmentation: Normal Prior

Alignment: Mild convex right scoliosis centered at L3. There is a
minimal grade 1 anterolisthesis at L5-S1.

Vertebrae: No worrisome osseous lesion, acute fracture or pars
defect. There are chronic progressive endplate degenerative changes
at the lower 3 disc space levels. Mild sacroiliac degenerative
changes are present bilaterally.

Paraspinal and other soft tissues: No significant paraspinal
findings. Hepatic steatosis and calcified lymph nodes in the
portacaval space are noted. There is mild aortoiliac
atherosclerosis.

Disc levels:

T12-L1: Disc bulging with broad-based foraminal and extraforaminal
disc protrusion on the right. Possible right T12 nerve root
encroachment.

L1-2:  No significant findings.

L2-3: Disc bulging, facet and ligamentous hypertrophy contribute to
mild spinal stenosis. The foramina are patent.

L3-4: Loss of disc height with annular disc bulging and endplate
osteophytes asymmetric to the left. There is a small focus of vacuum
phenomenon within a small disc fragment in the right lateral recess
which is unchanged. Mild facet and ligamentous hypertrophy
contributes to mild spinal stenosis. Endplate osteophytes result in
moderate left foraminal narrowing and possible left L3 nerve root
encroachment. The right foramen is patent.

L4-5: Chronic degenerative disc disease with loss of disc height,
annular disc bulging and endplate osteophytes. Mild facet and
ligamentous hypertrophy. There is mild foraminal narrowing, left
greater than right.

L5-S1: Chronic degenerative disc disease with loss of disc height,
annular disc bulging and endplate osteophytes. There is moderate
facet hypertrophy, worse on the right. These factors contribute to
moderate to severe right foraminal narrowing and possible right L5
nerve root encroachment. The left foramen demonstrates mild to
moderate narrowing.
IMPRESSION: 1. Progressive multilevel spondylosis compared with abdominal pelvic
CT from [DATE]. Broad-based foraminal and extraforaminal disc protrusion on the
right at T12-L1 with resulting possible right T12 nerve root
encroachment.
3. Mild multifactorial spinal stenosis at L2-3 without evidence of
nerve root encroachment.
4. Mild multifactorial spinal stenosis at L2-3 with asymmetric
moderate left foraminal narrowing due to endplate osteophytes.
Possible left L3 nerve root encroachment.
5. Chronic spondylosis at L4-5 with endplate osteophytes
contributing to mild left-greater-than-right foraminal narrowing.
6. Chronic spondylosis at L5-S1 with endplate osteophytes
contributing to moderate severe foraminal narrowing, worse on the
right.

## 2016-03-11 DIAGNOSIS — M859 Disorder of bone density and structure, unspecified: Secondary | ICD-10-CM | POA: Diagnosis not present

## 2016-03-11 DIAGNOSIS — M8588 Other specified disorders of bone density and structure, other site: Secondary | ICD-10-CM | POA: Diagnosis not present

## 2016-03-17 DIAGNOSIS — H903 Sensorineural hearing loss, bilateral: Secondary | ICD-10-CM | POA: Diagnosis not present

## 2016-03-18 DIAGNOSIS — M25551 Pain in right hip: Secondary | ICD-10-CM | POA: Diagnosis not present

## 2016-03-18 DIAGNOSIS — M5416 Radiculopathy, lumbar region: Secondary | ICD-10-CM | POA: Diagnosis not present

## 2016-03-18 DIAGNOSIS — M545 Low back pain: Secondary | ICD-10-CM | POA: Diagnosis not present

## 2016-03-23 DIAGNOSIS — H903 Sensorineural hearing loss, bilateral: Secondary | ICD-10-CM | POA: Diagnosis not present

## 2016-03-23 DIAGNOSIS — H6122 Impacted cerumen, left ear: Secondary | ICD-10-CM | POA: Diagnosis not present

## 2016-03-23 DIAGNOSIS — Z9629 Presence of other otological and audiological implants: Secondary | ICD-10-CM | POA: Diagnosis not present

## 2016-03-23 DIAGNOSIS — R490 Dysphonia: Secondary | ICD-10-CM | POA: Diagnosis not present

## 2016-03-23 DIAGNOSIS — H8103 Meniere's disease, bilateral: Secondary | ICD-10-CM | POA: Diagnosis not present

## 2016-03-23 DIAGNOSIS — Z974 Presence of external hearing-aid: Secondary | ICD-10-CM | POA: Diagnosis not present

## 2016-03-24 DIAGNOSIS — R1031 Right lower quadrant pain: Secondary | ICD-10-CM | POA: Diagnosis not present

## 2016-03-24 DIAGNOSIS — G894 Chronic pain syndrome: Secondary | ICD-10-CM | POA: Diagnosis not present

## 2016-03-26 ENCOUNTER — Other Ambulatory Visit: Payer: Self-pay | Admitting: Family Medicine

## 2016-03-26 DIAGNOSIS — R1031 Right lower quadrant pain: Secondary | ICD-10-CM

## 2016-03-27 DIAGNOSIS — M545 Low back pain: Secondary | ICD-10-CM | POA: Diagnosis not present

## 2016-03-27 DIAGNOSIS — M5416 Radiculopathy, lumbar region: Secondary | ICD-10-CM | POA: Diagnosis not present

## 2016-04-02 ENCOUNTER — Other Ambulatory Visit: Payer: Medicare Other

## 2016-04-05 ENCOUNTER — Other Ambulatory Visit: Payer: Self-pay | Admitting: Adult Health

## 2016-04-08 ENCOUNTER — Ambulatory Visit
Admission: RE | Admit: 2016-04-08 | Discharge: 2016-04-08 | Disposition: A | Discharge status: Home / Self Care | Payer: Medicare Other | Source: Ambulatory Visit | Attending: Family Medicine | Admitting: Family Medicine

## 2016-04-08 DIAGNOSIS — R1031 Right lower quadrant pain: Secondary | ICD-10-CM

## 2016-04-14 DIAGNOSIS — M545 Low back pain: Secondary | ICD-10-CM | POA: Diagnosis not present

## 2016-04-14 DIAGNOSIS — M5416 Radiculopathy, lumbar region: Secondary | ICD-10-CM | POA: Diagnosis not present

## 2016-05-13 DIAGNOSIS — J309 Allergic rhinitis, unspecified: Secondary | ICD-10-CM | POA: Diagnosis not present

## 2016-05-13 DIAGNOSIS — M8580 Other specified disorders of bone density and structure, unspecified site: Secondary | ICD-10-CM | POA: Diagnosis not present

## 2016-05-13 DIAGNOSIS — H8109 Meniere's disease, unspecified ear: Secondary | ICD-10-CM | POA: Diagnosis not present

## 2016-05-13 DIAGNOSIS — E559 Vitamin D deficiency, unspecified: Secondary | ICD-10-CM | POA: Diagnosis not present

## 2016-05-13 DIAGNOSIS — K219 Gastro-esophageal reflux disease without esophagitis: Secondary | ICD-10-CM | POA: Diagnosis not present

## 2016-05-13 DIAGNOSIS — E782 Mixed hyperlipidemia: Secondary | ICD-10-CM | POA: Diagnosis not present

## 2016-05-13 DIAGNOSIS — F319 Bipolar disorder, unspecified: Secondary | ICD-10-CM | POA: Diagnosis not present

## 2016-05-13 DIAGNOSIS — M1990 Unspecified osteoarthritis, unspecified site: Secondary | ICD-10-CM | POA: Diagnosis not present

## 2016-08-17 DIAGNOSIS — R938 Abnormal findings on diagnostic imaging of other specified body structures: Secondary | ICD-10-CM | POA: Diagnosis not present

## 2016-08-17 DIAGNOSIS — M1990 Unspecified osteoarthritis, unspecified site: Secondary | ICD-10-CM | POA: Diagnosis not present

## 2016-08-17 DIAGNOSIS — J309 Allergic rhinitis, unspecified: Secondary | ICD-10-CM | POA: Diagnosis not present

## 2016-08-20 ENCOUNTER — Other Ambulatory Visit: Payer: Self-pay | Admitting: Family Medicine

## 2016-08-20 DIAGNOSIS — R932 Abnormal findings on diagnostic imaging of liver and biliary tract: Secondary | ICD-10-CM

## 2016-08-25 ENCOUNTER — Other Ambulatory Visit: Payer: Self-pay | Admitting: Family Medicine

## 2016-08-25 DIAGNOSIS — R9389 Abnormal findings on diagnostic imaging of other specified body structures: Secondary | ICD-10-CM

## 2016-09-02 ENCOUNTER — Ambulatory Visit
Admission: RE | Admit: 2016-09-02 | Discharge: 2016-09-02 | Disposition: A | Discharge status: Home / Self Care | Payer: Medicare Other | Source: Ambulatory Visit | Attending: Family Medicine | Admitting: Family Medicine

## 2016-09-02 DIAGNOSIS — R9389 Abnormal findings on diagnostic imaging of other specified body structures: Secondary | ICD-10-CM

## 2016-09-02 DIAGNOSIS — K7689 Other specified diseases of liver: Secondary | ICD-10-CM | POA: Diagnosis not present

## 2016-09-02 MED ORDER — IOPAMIDOL (ISOVUE-300) INJECTION 61%
100.0000 mL | Freq: Once | INTRAVENOUS | Status: AC | PRN
  Administered 2016-09-02: 100 mL via INTRAVENOUS

## 2016-09-07 DIAGNOSIS — M5416 Radiculopathy, lumbar region: Secondary | ICD-10-CM | POA: Diagnosis not present

## 2016-09-07 DIAGNOSIS — M545 Low back pain: Secondary | ICD-10-CM | POA: Diagnosis not present

## 2016-09-16 DIAGNOSIS — M25512 Pain in left shoulder: Secondary | ICD-10-CM | POA: Diagnosis not present

## 2016-09-16 DIAGNOSIS — M25511 Pain in right shoulder: Secondary | ICD-10-CM | POA: Diagnosis not present

## 2016-10-01 DIAGNOSIS — S83242A Other tear of medial meniscus, current injury, left knee, initial encounter: Secondary | ICD-10-CM | POA: Diagnosis not present

## 2016-10-01 DIAGNOSIS — Z96651 Presence of right artificial knee joint: Secondary | ICD-10-CM | POA: Diagnosis not present

## 2016-10-17 DIAGNOSIS — F332 Major depressive disorder, recurrent severe without psychotic features: Secondary | ICD-10-CM | POA: Diagnosis not present

## 2016-10-19 DIAGNOSIS — F332 Major depressive disorder, recurrent severe without psychotic features: Secondary | ICD-10-CM | POA: Diagnosis not present

## 2016-10-20 DIAGNOSIS — F332 Major depressive disorder, recurrent severe without psychotic features: Secondary | ICD-10-CM | POA: Diagnosis not present

## 2016-10-21 DIAGNOSIS — F332 Major depressive disorder, recurrent severe without psychotic features: Secondary | ICD-10-CM | POA: Diagnosis not present

## 2016-10-22 ENCOUNTER — Encounter (HOSPITAL_COMMUNITY): Payer: Self-pay | Admitting: Emergency Medicine

## 2016-10-22 ENCOUNTER — Emergency Department (HOSPITAL_COMMUNITY)
Admission: EM | Admit: 2016-10-22 | Discharge: 2016-10-25 | Disposition: A | Discharge status: Other Acute Inpatient Hospital | Payer: Medicare Other | Attending: Emergency Medicine | Admitting: Emergency Medicine

## 2016-10-22 ENCOUNTER — Emergency Department (HOSPITAL_COMMUNITY): Payer: Medicare Other

## 2016-10-22 DIAGNOSIS — F1990 Other psychoactive substance use, unspecified, uncomplicated: Secondary | ICD-10-CM | POA: Diagnosis present

## 2016-10-22 DIAGNOSIS — Z9889 Other specified postprocedural states: Secondary | ICD-10-CM | POA: Diagnosis not present

## 2016-10-22 DIAGNOSIS — R079 Chest pain, unspecified: Secondary | ICD-10-CM | POA: Diagnosis not present

## 2016-10-22 DIAGNOSIS — R4781 Slurred speech: Secondary | ICD-10-CM | POA: Insufficient documentation

## 2016-10-22 DIAGNOSIS — Z87891 Personal history of nicotine dependence: Secondary | ICD-10-CM | POA: Insufficient documentation

## 2016-10-22 DIAGNOSIS — Z96651 Presence of right artificial knee joint: Secondary | ICD-10-CM | POA: Insufficient documentation

## 2016-10-22 DIAGNOSIS — I10 Essential (primary) hypertension: Secondary | ICD-10-CM | POA: Insufficient documentation

## 2016-10-22 DIAGNOSIS — Z96611 Presence of right artificial shoulder joint: Secondary | ICD-10-CM | POA: Insufficient documentation

## 2016-10-22 DIAGNOSIS — R0789 Other chest pain: Secondary | ICD-10-CM | POA: Insufficient documentation

## 2016-10-22 DIAGNOSIS — Z7982 Long term (current) use of aspirin: Secondary | ICD-10-CM | POA: Insufficient documentation

## 2016-10-22 DIAGNOSIS — Z79899 Other long term (current) drug therapy: Secondary | ICD-10-CM | POA: Insufficient documentation

## 2016-10-22 DIAGNOSIS — F32A Depression, unspecified: Secondary | ICD-10-CM | POA: Diagnosis present

## 2016-10-22 DIAGNOSIS — F329 Major depressive disorder, single episode, unspecified: Secondary | ICD-10-CM | POA: Diagnosis present

## 2016-10-22 DIAGNOSIS — Z8249 Family history of ischemic heart disease and other diseases of the circulatory system: Secondary | ICD-10-CM | POA: Diagnosis not present

## 2016-10-22 DIAGNOSIS — F332 Major depressive disorder, recurrent severe without psychotic features: Secondary | ICD-10-CM | POA: Diagnosis not present

## 2016-10-22 LAB — CBC
HEMATOCRIT: 39.6 % (ref 36.0–46.0)
HEMOGLOBIN: 13.8 g/dL (ref 12.0–15.0)
MCH: 32.7 pg (ref 26.0–34.0)
MCHC: 34.8 g/dL (ref 30.0–36.0)
MCV: 93.8 fL (ref 78.0–100.0)
Platelets: 211 10*3/uL (ref 150–400)
RBC: 4.22 MIL/uL (ref 3.87–5.11)
RDW: 12.1 % (ref 11.5–15.5)
WBC: 6.5 10*3/uL (ref 4.0–10.5)

## 2016-10-22 LAB — RAPID URINE DRUG SCREEN, HOSP PERFORMED
Amphetamines: NOT DETECTED
Barbiturates: NOT DETECTED
Benzodiazepines: NOT DETECTED
COCAINE: NOT DETECTED
OPIATES: POSITIVE — AB
Tetrahydrocannabinol: POSITIVE — AB

## 2016-10-22 LAB — BASIC METABOLIC PANEL
ANION GAP: 7 (ref 5–15)
BUN: 15 mg/dL (ref 6–20)
CHLORIDE: 102 mmol/L (ref 101–111)
CO2: 25 mmol/L (ref 22–32)
Calcium: 10.2 mg/dL (ref 8.9–10.3)
Creatinine, Ser: 0.91 mg/dL (ref 0.44–1.00)
GFR calc Af Amer: >60 mL/min (ref >60)
GLUCOSE: 90 mg/dL (ref 65–99)
POTASSIUM: 5.1 mmol/L (ref 3.5–5.1)
Sodium: 134 mmol/L — ABNORMAL LOW (ref 135–145)

## 2016-10-22 LAB — I-STAT TROPONIN, ED: Troponin i, poc: 0 ng/mL (ref 0.00–0.08)

## 2016-10-22 LAB — ETHANOL

## 2016-10-22 MED ORDER — ACETAMINOPHEN 325 MG PO TABS
650.0000 mg | ORAL_TABLET | ORAL | Status: DC | PRN
  Administered 2016-10-23 – 2016-10-25 (×5): 650 mg via ORAL
  Filled 2016-10-22 (×6): qty 2

## 2016-10-22 MED ORDER — IBUPROFEN 400 MG PO TABS: 600.0000 mg | ORAL_TABLET | Freq: Three times a day (TID) | ORAL | Status: DC | PRN

## 2016-10-22 MED ORDER — ONDANSETRON HCL 4 MG PO TABS
4.0000 mg | ORAL_TABLET | Freq: Three times a day (TID) | ORAL | Status: DC | PRN
Start: 2016-10-22 — End: 2016-10-25
  Administered 2016-10-24 – 2016-10-25 (×3): 4 mg via ORAL
  Filled 2016-10-22 (×3): qty 1

## 2016-10-22 NOTE — ED Notes (Signed)
The pts son states that last night the pt started "doing all the stuff she was doing before she went to that facility. She had alcohol and marijuana".

## 2016-10-22 NOTE — BHH Counselor (Signed)
Talked w Dr. Thomasene Lot to get additional information about pt.  EDP sts that RN is trying to locate son to get pt's hearing aide operational again so assessment can move forward.   Per EDP, pt is poor historian. Pt is slurring words and denying any problems. Per EDP, pt had a 6 day IP stay at a facility in [facility] and was discharged yesterday. EDP did not know the name of the facility. EDP sts son indicated that IP stay was due to SI. Per EDP, per son, upon discharge pt took pills she had kept and hidden when son came in and took charge of all her medications due to worry she might OD. Pt was positive for opiates and THC when tested in the ED today.  Will continue to contact RN and proceed with assessment when hearing aid is operational.  Elizabeth Kurtz, MS, CRC, [place] Triage Specialist [facility]

## 2016-10-22 NOTE — BHH Counselor (Signed)
Spoke with pt's son to get collateral information. Per EDP, pt is a poor historian and slurring words.   Faylene Kurtz, MS, CRC, [place] Triage Specialist [facility]

## 2016-10-22 NOTE — BHH Counselor (Signed)
Called to initiate TTS assessment . RN Verline Lema informed that pt is deaf and does not know sign language. Pt sts (in writing to nurse) that the battery in her implant is dead currently. RN sts she will ask EDP about how to proceed.  Faylene Kurtz, MS, CRC, [place] Triage Specialist [facility]

## 2016-10-22 NOTE — ED Notes (Signed)
Pt placed on bedpan

## 2016-10-22 NOTE — ED Notes (Addendum)
Pt calling out requesting to leave. Pt informed we are awaiting TTS to call us for further recommendation at this time. This nurse offered her a sandwich and she refused stating that she "isnt eating hospital food"

## 2016-10-22 NOTE — ED Notes (Signed)
TTS at bedside. 

## 2016-10-22 NOTE — ED Triage Notes (Signed)
Patient presents today with complaints of Chest pain. States was at [facility] for a week and was released 3 days ago and started having Chest pain. Patient states the only thing helps pain is anxiety medication. Patient is alert and oriented x4 moving all extremities.

## 2016-10-22 NOTE — BHH Counselor (Signed)
Spoke with Architect. Son has gone to get batteries for implant. Given his estimated return time, agreed to check back at 8:45 P to see if assessment could be completed.   Faylene Kurtz, MS, CRC, [place] Triage Specialist [facility]

## 2016-10-22 NOTE — ED Provider Notes (Addendum)
[facility] DEPT Provider Note   CSN: [id number] Arrival date & time: 10/22/16  1541     History   Chief Complaint Chief Complaint  Patient presents with  . Chest Pain    HPI Elizabeth Stone is a 71 y.o. female.  HPI   Patient is a 71 year old female with history of fiber myalgia, bipolar, depression, SI presenting today with atypical chest pain. Patient says she had some chest pain last night. She said she was brought here by her son because of "almost passing out". Patient unable to really describe chest pain any further. Does not sound ischemic in nature. It is not pleuritic. She says she just had a little bit of heaviness in her chest that disappeared. Does not go to arms are neck, no shortness breath, no diaphoresis.   She was discharged last night from psych facility.  Patient difficult historian.   Past Medical History:  Diagnosis Date  . Acute blood loss anemia   . Anxiety   . Arthritis   . Bipolar disorder ([place])   . Depression   . Fibromyalgia   . GERD (gastroesophageal reflux disease)   . Hearing impaired    has cochlear implant  . HTN (hypertension)   . Hypokalemia   . Meniere disease    takes HCTZ for her inner ear problem  . Mixed hyperlipidemia 09/27/2013  . Osteoporosis   . Primary localized osteoarthritis of right knee   . Slow transit constipation   . Unsteady gait     Patient Active Problem List   Diagnosis Date Noted  . Primary localized osteoarthritis of right knee   . Rotator cuff tear arthropathy 12/13/2014  . Ulcer of right second toe ([place]) 12/21/2013  . Family history of ischemic heart disease 09/27/2013  . Essential hypertension, benign 09/27/2013  . Mixed hyperlipidemia 09/27/2013  . Arthritis   . Anxiety   . Depression   . GERD (gastroesophageal reflux disease)   . Meniere disease   . Osteoporosis   . Hearing impaired     Past Surgical History:  Procedure Laterality Date  . ABDOMINAL HYSTERECTOMY    . bladder tack    .  COCHLEAR IMPLANT     2007  . COLONOSCOPY    . HAMMER TOE SURGERY     left foot  . REVERSE SHOULDER ARTHROPLASTY Right 12/13/2014   dr Tamera Punt  . REVERSE SHOULDER ARTHROPLASTY Right 12/13/2014   Procedure: REVERSE SHOULDER ARTHROPLASTY;  Surgeon: Nita Sells, MD;  Location: [facility];  Service: Orthopedics;  Laterality: Right;  Right reverse total shoulder replacement  . TOTAL KNEE ARTHROPLASTY Right 12/16/2015   Procedure: TOTAL KNEE ARTHROPLASTY;  Surgeon: Elsie Saas, MD;  Location: [facility];  Service: Orthopedics;  Laterality: Right;    OB History    No data available       Home Medications    Prior to Admission medications   Medication Sig Start Date End Date Taking? Authorizing Provider  aspirin 81 MG chewable tablet Chew 81 mg by mouth daily.   Yes Historical Provider, MD  atorvastatin (LIPITOR) 10 MG tablet Take 10 mg by mouth daily.   Yes Historical Provider, MD  buPROPion (WELLBUTRIN XL) 300 MG 24 hr tablet Take 300 mg by mouth Daily.  06/11/12  Yes Historical Provider, MD  calcium carbonate (OS-CAL) 600 MG TABS tablet Take 1,200 mg by mouth daily with breakfast.    Yes Historical Provider, MD  celecoxib (CELEBREX) 200 MG capsule Take 200 mg by mouth daily.  Yes Historical Provider, MD  Cholecalciferol (VITAMIN D-3 PO) Take 1,000 Units by mouth daily.   Yes Historical Provider, MD  clonazePAM (KLONOPIN) 0.5 MG tablet Take 0.5-1 mg by mouth 3 (three) times daily as needed for anxiety. Give 1/2 to 1 tab TID PRN   Yes Historical Provider, MD  hydrochlorothiazide (HYDRODIURIL) 25 MG tablet Take 25 mg by mouth Daily.  08/05/12  Yes Historical Provider, MD  KLOR-CON M20 20 MEQ tablet Take 20 mEq by mouth daily.  07/18/12  Yes Historical Provider, MD  lamoTRIgine (LAMICTAL) 200 MG tablet Take 200 mg by mouth daily.  07/09/12  Yes Historical Provider, MD  montelukast (SINGULAIR) 10 MG tablet Take 10 mg by mouth at bedtime.  07/15/12  Yes Historical Provider, MD  Omega-3 Fatty  Acids (FISH OIL) 1200 MG CAPS Take 1,200 mg by mouth daily.    Yes Historical Provider, MD  polyethylene glycol (MIRALAX / GLYCOLAX) packet Take 17 g by mouth daily as needed for moderate constipation.    Yes Historical Provider, MD  sennosides-docusate sodium (SENOKOT-S) 8.6-50 MG tablet Take 1 tablet by mouth 3 (three) times a week.    Yes Historical Provider, MD  vitamin C (ASCORBIC ACID) 500 MG tablet Take 500 mg by mouth 2 (two) times daily.   Yes Historical Provider, MD  aspirin EC 325 MG EC tablet 1 tab po BID for 30 days to prevent blood clots 12/19/15   Kirstin Shepperson, PA-C  CYMBALTA 60 MG capsule Take 60 mg by mouth 2 (two) times daily.  05/27/12   Historical Provider, MD  oxyCODONE (OXY IR/ROXICODONE) 5 MG immediate release tablet Take 1 tablet (5 mg total) by mouth every 4 (four) hours as needed for severe pain (Take 1-2 tablets by mouth every 4 hours as needed for pain). 12/24/15   Estill Dooms, MD  SAPHRIS 10 MG SUBL Take 10 mg by mouth At bedtime.  07/24/12   Historical Provider, MD    Family History Family History  Problem Relation Age of Onset  . Heart disease Mother   . Breast cancer Mother   . Cervical cancer Mother   . Diabetes Mother   . Heart disease Father   . Diabetes Maternal Aunt   . Colon cancer Neg Hx   . Esophageal cancer Neg Hx   . Rectal cancer Neg Hx   . Stomach cancer Neg Hx     Social History Social History  Substance Use Topics  . Smoking status: Former Smoker    Packs/day: 0.25    Years: 23.00    Quit date: 08/25/1993  . Smokeless tobacco: Never Used  . Alcohol use 6.0 oz/week    10 Shots of liquor per week     Allergies   Latex   Review of Systems Review of Systems  Constitutional: Negative for activity change and fever.  Respiratory: Positive for chest tightness. Negative for shortness of breath.   Cardiovascular: Negative for chest pain.  Gastrointestinal: Negative for abdominal pain.  Psychiatric/Behavioral: Negative for  self-injury and suicidal ideas. The patient is nervous/anxious.      Physical Exam Updated Vital Signs BP 145/71   Pulse 94   Temp 98.5 F (36.9 C) (Oral)   Resp (!) 27   SpO2 96%   Physical Exam  Constitutional: She is oriented to person, place, and time. She appears well-developed and well-nourished.  HENT:  Head: Normocephalic and atraumatic.  Eyes: Right eye exhibits no discharge.  Cardiovascular: Normal rate, regular rhythm and normal heart  sounds.   No murmur heard. Pulmonary/Chest: Effort normal and breath sounds normal. She has no wheezes. She has no rales.  Abdominal: Soft. She exhibits no distension. There is no tenderness.  Neurological: She is oriented to person, place, and time.  Patient slurring her words, poor historian.  Skin: Skin is warm and dry. She is not diaphoretic.  Psychiatric:  Patient with odd affect. Denies active SI.  Nursing note and vitals reviewed.    ED Treatments / Results  Labs (all labs ordered are listed, but only abnormal results are displayed) Labs Reviewed  BASIC METABOLIC PANEL - Abnormal; Notable for the following:       Result Value   Sodium 134 (*)    All other components within normal limits  CBC  ETHANOL  RAPID URINE DRUG SCREEN, [place], ED    EKG  EKG Interpretation  Date/Time:  Thursday October 22 2016 15:53:15 EST Ventricular Rate:  86 PR Interval:  102 QRS Duration: 88 QT Interval:  372 QTC Calculation: 445 R Axis:   75 Text Interpretation:  Sinus rhythm with short PR Otherwise normal ECG Normal sinus rhythm Confirmed by Gerald Leitz ([id number]) on 10/22/2016 4:01:21 PM       Radiology Dg Chest 2 View  Result Date: 10/22/2016 CLINICAL DATA:  Onset of chest pain last night patient released from [facility] ago. Tingling in the hands and feet. EXAM: CHEST  2 VIEW COMPARISON:  Chest x-ray dated May 15, 2009 FINDINGS: The lungs are mildly hypoinflated. There is no focal infiltrate.  There is no pleural effusion. The heart and pulmonary vascularity are normal. The mediastinum is normal in width. There is mild levocurvature centered at the thoracolumbar junction. There is severe degenerative change of the left shoulder. There is a prosthetic right shoulder joint. IMPRESSION: Mild hypoinflation with some crowding of the lower lobe interstitial markings. No alveolar pneumonia, CHF, nor other acute cardiopulmonary disease. Electronically Signed   By: David  Martinique M.D.   On: 10/22/2016 16:19    Procedures Procedures (including critical care time)  Medications Ordered in ED Medications - No data to display   Initial Impression / Assessment and Plan / ED Course  I have reviewed the triage vital signs and the nursing notes.  Pertinent labs & imaging results that were available during my care of the patient were reviewed by me and considered in my medical decision making (see chart for details).  Clinical Course    Patient is a 71 year old female presenting with atypical chest pain. Patient vague historian. As it turns out patient's son pulled me aside and said he really brought her here because she is worried about her safety. He does not feel safe with her going home. She is taking lots of medications. Reportedly she just had a weeklong stay at a psych facility in [place]. He cleaned out her med cabniet before she returned home.  She was discharged last night she took medications at home according to him. Apparetnly she had squirreled away meds including opioids, clonipin and marijuana..Patient does have slurred speech here. UDS + here.  We will medically clear her and her TTS consult.   Per pharmacy she was started on Effexor 150, Olanzipine 5, Remeron 7.5 filled yesterday.   Pt is not able to leave, needs psych input in the am, not safe to return home.  Currently ammenable to wait for another eval in am.  Final Clinical Impressions(s) / ED Diagnoses   Final diagnoses:  None    New Prescriptions New Prescriptions   No medications on file     Caelen Higinbotham Julio Alm, MD 10/22/16 1914    Brielyn Bosak Julio Alm, MD 10/22/16 1953    Daley Gosse Julio Alm, MD 10/22/16 2351

## 2016-10-22 NOTE — ED Notes (Signed)
Pts family member (son) Mr. Haynes Kerns is in waiting room if needed.

## 2016-10-22 NOTE — BH Assessment (Addendum)
Tele Assessment Note   Elizabeth Stone is an 71 y.o. female BIB her son, Jaynie Collins, who sts that pt was discharged from [facility] yesterday after a 6 days stay. Per son, today pt sought out pills she had hidden from him and overdosed. It is unclear to son and pt denied an intentional OD. Per son, today pt started to c/o chest pain. Per son, pt's behavior became erratic and angry so, he brought he to the ED for further evaluation. Pt was hospitalized 6 days ago due to making threats she would kill herself during an altercation with her son. Pt currently denies SI, HI, SHI and AVH at this time. Per son, pt uses cannabis daily, drinks excessive amounts of alcohol daily and abuses her prescribed medications (Hydorcodone, Klonopin among others.) Son sts he and pt's daughter, his sister, are seeking pt's admission to a substance rehab facility currently once pt is stablizied psychiatrically. Pt has a hx of Bipolar D/O per son's report and multiple chronic health conditions that upset her and tend to isolate pt, especially at night. Per son, pt has turned to substances use and abuse to ease her loneliness. Pt sts she does struggle with loneliness, although she sts she is involved in many groups and activities throughout the community, her church and with her friends. Pt sts that her current stressors include continuing grief over her husband's death 3 yrs ago and her continuing health issues. Pt's symptoms of depression including sadness, fatigue, decreased self esteem, self isolation, irritability, negative outlook, difficulty thinking & concentrating, feeling helpless and hopeless at times, sleep and eating disturbances. Pt denies symptoms of anxiety but per son, pt has had increasing panic attack in the last year. Pt has been seeing [facility] for psychiatric medication management but sts she stopped seeing her threrapist about the time her husband died. Pt sts she once was a pt at [facility].   Pt has  lives alone since her husband of 23 years died 3 years ago. Her son lives in [place] and he and his wife are a support for pt. Pt also has a daughter who lives out-of-state who provides emotional support. 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 Pt has 2 son, 1 daughter and a step-daughter whom she cares about. Pt tested positive tonight in the ED for opiates and THC. Pt's BAL was <5. Per son, pt drinks about 1/2 a fifth of liquor per day, smokes cannabis twice a day and abuses her prescription psychiatric medications, continually overdosing herself. Per son, pt has had substance abuse issues since her husband died. Pt denies any hx of physical, emotional, verbal or sexual abuse. Pt denies any hx of legal issues past or present. Pt denies any hx of angry issues or aggression. Pt sts she has not slept in 6 days (while IP) and has only had a small amount to eat for the last 6 days. Pt was documented to have an unstable gait and hasseveral health conditions which may make her a fall risk.  Pt is hearing impaired and hears with use of an implant.   Pt was dressed in hospital gown. Pt was alert, minimally cooperative and irritable. Pt kept good eye contact, spoke in a slurred tone and at a normal pace. Pt moved in a normal manner when moving. Pt's thought process was coherent and relevant and judgement was impaired.  No indication of delusional thinking or response to internal stimuli. Pt's mood was stated to be neither  depressed nor anxious and her blunted affect was incongruent.  Pt was oriented x 4, to person, place, time and situation.   Diagnosis: Bipolar D/O by hx  Past Medical History:  Past Medical History:  Diagnosis Date  . Acute blood loss anemia   . Anxiety   . Arthritis   . Bipolar disorder ([place])   . Depression   . Fibromyalgia   . GERD (gastroesophageal reflux disease)   . Hearing impaired    has cochlear implant  . HTN (hypertension)   . Hypokalemia   . Meniere disease     takes HCTZ for her inner ear problem  . Mixed hyperlipidemia 09/27/2013  . Osteoporosis   . Primary localized osteoarthritis of right knee   . Slow transit constipation   . Unsteady gait     Past Surgical History:  Procedure Laterality Date  . ABDOMINAL HYSTERECTOMY    . bladder tack    . COCHLEAR IMPLANT     2007  . COLONOSCOPY    . HAMMER TOE SURGERY     left foot  . REVERSE SHOULDER ARTHROPLASTY Right 12/13/2014   dr Tamera Punt  . REVERSE SHOULDER ARTHROPLASTY Right 12/13/2014   Procedure: REVERSE SHOULDER ARTHROPLASTY;  Surgeon: Nita Sells, MD;  Location: [facility];  Service: Orthopedics;  Laterality: Right;  Right reverse total shoulder replacement  . TOTAL KNEE ARTHROPLASTY Right 12/16/2015   Procedure: TOTAL KNEE ARTHROPLASTY;  Surgeon: Elsie Saas, MD;  Location: [facility];  Service: Orthopedics;  Laterality: Right;    Family History:  Family History  Problem Relation Age of Onset  . Heart disease Mother   . Breast cancer Mother   . Cervical cancer Mother   . Diabetes Mother   . Heart disease Father   . Diabetes Maternal Aunt   . Colon cancer Neg Hx   . Esophageal cancer Neg Hx   . Rectal cancer Neg Hx   . Stomach cancer Neg Hx     Social History:  reports that she quit smoking about 23 years ago. She has a 5.75 pack-year smoking history. She has never used smokeless tobacco. She reports that she drinks about 6.0 oz of alcohol per week . She reports that she does not use drugs.  Additional Social History:  Alcohol / Drug Use Prescriptions: see MAR History of alcohol / drug use?: Yes Longest period of sobriety (when/how long): unknown Substance #1 Name of Substance 1: Alcohol 1 - Age of First Use: unknown 1 - Amount (size/oz): 1/2 of a fifth minimum 1 - Frequency: daily 1 - Duration: unknown 1 - Last Use / Amount: unknown Substance #2 Name of Substance 2: Cannabis 2 - Age of First Use: unknown 2 - Amount (size/oz): unknown 2 - Frequency: 2 times per  day per son 2 - Duration: daily 2 - Last Use / Amount: unknown Substance #3 Name of Substance 3: Opiates- tested positive in ED; Abuse of Hydrocodone RX 3 - Age of First Use: unknown 3 - Amount (size/oz): unknown 3 - Frequency: daily 3 - Duration: unknown 3 - Last Use / Amount: unknown  CIWA: CIWA-Ar BP: 139/96 Pulse Rate: 94 COWS:    PATIENT STRENGTHS: (choose at least two) Average or above average intelligence Capable of independent living Communication skills Supportive family/friends  Allergies:  Allergies  Allergen Reactions  . Klonopin [Clonazepam] Other (See Comments)    agitation  . Latex Hives and Rash    Home Medications:  (Not in a hospital admission)  OB/GYN Status:  No LMP recorded. Patient has had a hysterectomy.  General Assessment Data Location of Assessment: [facility] ED TTS Assessment: In system Is this a Tele or Face-to-Face Assessment?: Tele Assessment Is this an Initial Assessment or a Re-assessment for this encounter?: Initial Assessment Marital status: Widowed [place] name:  (unknown) Is patient pregnant?: No Pregnancy Status: No Living Arrangements: Alone Can pt return to current living arrangement?: Yes Admission Status: Voluntary Is patient capable of signing voluntary admission?: Yes Referral Source: Self/Family/Friend Insurance type:  Nurse, mental health Medicare)     Crisis Care Plan Living Arrangements: Alone Legal Guardian:  (self) Name of Psychiatrist:  [facility]) Name of Therapist:  (None currently)  Education Status Is patient currently in school?: No Highest grade of school patient has completed:  (unknown)  Risk to self with the past 6 months Suicidal Ideation: No (denies) Has patient been a risk to self within the past 6 months prior to admission? : Yes (just discharged today from [facility] due to [place]) Suicidal Intent: No (denies) Has patient had any suicidal intent within the past 6 months prior to admission? : Yes Is patient at risk for  suicide?:  (undetermined) Suicidal Plan?: No (denies) Has patient had any suicidal plan within the past 6 months prior to admission? : Yes Access to Means: Yes Specify Access to Suicidal Means:  (pt sts she has access to 1 of her deceased husband's rifles) What has been your use of drugs/alcohol within the last 12 months?:  (daily use of multiple substances) Previous Attempts/Gestures: No How many times?:  (0) Other Self Harm Risks:  (none) Triggers for Past Attempts: None known Intentional Self Injurious Behavior: None Family Suicide History: Yes (2 of pt's sisters commited suicide plus 1 neice) Recent stressful life event(s): Loss (Comment), Recent negative physical changes (husband died 3 yrs ago) Persecutory voices/beliefs?: No Depression: Yes Depression Symptoms: Insomnia, Fatigue, Feeling angry/irritable Substance abuse history and/or treatment for substance abuse?: Yes (per son) Suicide prevention information given to non-admitted patients: Not applicable  Risk to Others within the past 6 months Homicidal Ideation: No (denies) Does patient have any lifetime risk of violence toward others beyond the six months prior to admission? : No Thoughts of Harm to Others: No Current Homicidal Intent: No Current Homicidal Plan: No Access to Homicidal Means: No Identified Victim:  (none) History of harm to others?: No Assessment of Violence: None Noted Violent Behavior Description:  (na) Does patient have access to weapons?: Yes (Comment) (access to 1 of husband's rifles) Criminal Charges Pending?: No Does patient have a court date: No Is patient on probation?: No  Psychosis Hallucinations: None noted Delusions: None noted  Mental Status Report Appearance/Hygiene: Disheveled, Unremarkable Eye Contact: Good Motor Activity: Freedom of movement Speech: Logical/coherent, Slurred Level of Consciousness: Alert, Irritable Mood: Angry Affect: Angry, Blunted Anxiety Level:  None Thought Processes: Coherent, Relevant Judgement: Impaired Orientation: Person, Place, Time, Situation Obsessive Compulsive Thoughts/Behaviors: None (none reported)  Cognitive Functioning Concentration: Decreased Memory: Recent Intact, Remote Intact (no obvious difficulty) IQ: Average Insight: Poor Impulse Control: Poor Appetite: Poor Sleep: Decreased (sts has not slept in 6 days (during IP stay)) Vegetative Symptoms: None  ADLScreening [facility] Assessment Services) Patient's cognitive ability adequate to safely complete daily activities?: Yes Patient able to express need for assistance with ADLs?: Yes Independently performs ADLs?: Yes (appropriate for developmental age) (no barriers reported but multiple health issues)  Prior Inpatient Therapy Prior Inpatient Therapy: Yes Prior Therapy Dates:  (Discharged 10/22/2016 after 6 days IP) Prior Therapy Facilty/Provider(s):  ([facility]) Reason for Treatment:  (SI)  Prior Outpatient Therapy Prior Outpatient Therapy: No Does patient have an ACCT team?: No Does patient have Intensive In-House Services?  : No Does patient have [facility] services? : No Does patient have P4CC services?: No  ADL Screening (condition at time of admission) Patient's cognitive ability adequate to safely complete daily activities?: Yes Patient able to express need for assistance with ADLs?: Yes Independently performs ADLs?: Yes (appropriate for developmental age) (no barriers reported but multiple health issues)       Abuse/Neglect Assessment (Assessment to be complete while patient is alone) Physical Abuse: Denies Verbal Abuse: Denies Sexual Abuse: Denies Exploitation of patient/patient's resources: Denies Self-Neglect: Denies     Regulatory affairs officer (For Healthcare) Does Patient Have a Medical Advance Directive?: Yes Would patient like information on creating a medical advance directive?: No - Patient declined    Additional Information 1:1 In  Past 12 Months?: Yes CIRT Risk: No Elopement Risk: No Does patient have medical clearance?: Yes     Disposition:  Disposition Initial Assessment Completed for this Encounter: Yes Disposition of Patient: Other dispositions Other disposition(s): Other (Comment) (Pending review w [facility])  Per Lindon Romp, NP: Recommend re-evaluation by psychiatry 10/23/2016 for final disposition.   Spoke with Dr. Thomasene Lot, [place] at [place] of recommendation. She agreed.  Faylene Kurtz, MS, CRC, [place] Triage Specialist [facility] T 10/22/2016 9:53 PM

## 2016-10-23 DIAGNOSIS — Z8249 Family history of ischemic heart disease and other diseases of the circulatory system: Secondary | ICD-10-CM

## 2016-10-23 DIAGNOSIS — Z9889 Other specified postprocedural states: Secondary | ICD-10-CM | POA: Diagnosis not present

## 2016-10-23 DIAGNOSIS — Z79899 Other long term (current) drug therapy: Secondary | ICD-10-CM

## 2016-10-23 DIAGNOSIS — Z87891 Personal history of nicotine dependence: Secondary | ICD-10-CM | POA: Diagnosis not present

## 2016-10-23 DIAGNOSIS — R45851 Suicidal ideations: Secondary | ICD-10-CM

## 2016-10-23 DIAGNOSIS — Z833 Family history of diabetes mellitus: Secondary | ICD-10-CM

## 2016-10-23 DIAGNOSIS — F332 Major depressive disorder, recurrent severe without psychotic features: Secondary | ICD-10-CM | POA: Diagnosis not present

## 2016-10-23 DIAGNOSIS — Z888 Allergy status to other drugs, medicaments and biological substances status: Secondary | ICD-10-CM

## 2016-10-23 DIAGNOSIS — Z803 Family history of malignant neoplasm of breast: Secondary | ICD-10-CM | POA: Diagnosis not present

## 2016-10-23 DIAGNOSIS — Z91040 Latex allergy status: Secondary | ICD-10-CM

## 2016-10-23 DIAGNOSIS — F1990 Other psychoactive substance use, unspecified, uncomplicated: Secondary | ICD-10-CM | POA: Diagnosis not present

## 2016-10-23 MED ORDER — HYDROXYZINE HCL 50 MG PO TABS
50.0000 mg | ORAL_TABLET | Freq: Every evening | ORAL | Status: DC | PRN
  Administered 2016-10-23 – 2016-10-24 (×2): 50 mg via ORAL
  Filled 2016-10-23 (×2): qty 1

## 2016-10-23 MED ORDER — HYDROXYZINE HCL 25 MG PO TABS
25.0000 mg | ORAL_TABLET | Freq: Three times a day (TID) | ORAL | Status: DC | PRN
  Administered 2016-10-23 – 2016-10-24 (×3): 25 mg via ORAL
  Filled 2016-10-23 (×4): qty 1

## 2016-10-23 MED ORDER — HYDROXYZINE HCL 50 MG PO TABS
50.0000 mg | ORAL_TABLET | Freq: Once | ORAL | Status: AC
  Administered 2016-10-23: 50 mg via ORAL
  Filled 2016-10-23: qty 1

## 2016-10-23 MED ORDER — LOPERAMIDE HCL 2 MG PO CAPS
4.0000 mg | ORAL_CAPSULE | Freq: Once | ORAL | Status: AC
  Administered 2016-10-23: 4 mg via ORAL
  Filled 2016-10-23: qty 2

## 2016-10-23 NOTE — ED Notes (Signed)
Patient states she feels anxiety

## 2016-10-23 NOTE — ED Notes (Signed)
Pt up to desk, changed her hearing aid batteries and went back to room.

## 2016-10-23 NOTE — ED Notes (Signed)
EDP made aware   Patient complained of Chest Pain. EDP recommendation EKG

## 2016-10-23 NOTE — ED Notes (Signed)
Patient again came to nurse's desk requesting anxiety medication. RN discussed with patient that Vistaril 25 mg is ordered three times daily as needed for anxiety. Patient became angry, raised voiced stating "that doesn't do a thing for me. I haven't slept in 8 days." Reminded patient that she slept last night. Patient stated that she received 50 mg last night, and she needs that to sleep. Demanded to speak to the MD. RN will discuss with MD.

## 2016-10-23 NOTE — ED Notes (Signed)
Hearing aids given to patient. Patient placed in ear. Patient alert eating breakfast at this time.

## 2016-10-23 NOTE — ED Notes (Signed)
Patient requested RN to call her son Elizabeth Stone at [phone]. Mr. Elizabeth Stone did not answer unable to leave voicemail.

## 2016-10-23 NOTE — ED Notes (Signed)
Very irritable, wanting Klonopin and saying she has to have it. Speech is somewhat slurred.

## 2016-10-23 NOTE — Progress Notes (Signed)
Inpatient treatment recommended for pt per telepsych assessment. Referred to: [facility]  Left voiceamails with [facility] and [facility] as well, will refer if there is bed availability.  Sharren Bridge, MSW, LCSW Clinical Social Work, Disposition  10/23/2016 [phone]

## 2016-10-23 NOTE — ED Notes (Signed)
TTS monitor at bedside 

## 2016-10-23 NOTE — ED Notes (Signed)
Pt eating a sandwich and water.

## 2016-10-23 NOTE — ED Notes (Signed)
Patient ambulated to the restroom

## 2016-10-23 NOTE — ED Notes (Signed)
Patient states she would not eat fish patient requested Cheeseburger. RN placed order.,

## 2016-10-23 NOTE — ED Notes (Signed)
Patient came to nurse's desk requesting Klonopin for anxiety. RN informed patient that Vistaril is ordered for patient. Patient states "that stuff doesn't work for me. I've been taking Klonopin for 7 years. They're taking it away because my son said I was allergic to it. He said that because he wants my Klonopin." Patient walked hastily back to her room.

## 2016-10-23 NOTE — ED Notes (Signed)
Pt given ginger ale and allowed to make her second phone call of the day.

## 2016-10-23 NOTE — ED Notes (Signed)
Patient in bed. Snack at bedside.

## 2016-10-23 NOTE — Consult Note (Signed)
Telepsych Consultation   Reason for Consult:  Depressed mood and overdose Referring Physician:  EDP Patient Identification: Elizabeth Stone MRN:  [id number] Principal Diagnosis: Depression Diagnosis:   Patient Active Problem List   Diagnosis Date Noted  . Depression [F32.9]     Priority: High  . Primary localized osteoarthritis of right knee [M17.11]   . Rotator cuff tear arthropathy [M12.819] 12/13/2014  . Ulcer of right second toe ([place]) [L97.519] 12/21/2013  . Family history of ischemic heart disease [Z82.49] 09/27/2013  . Essential hypertension, benign [I10] 09/27/2013  . Mixed hyperlipidemia [E78.2] 09/27/2013  . Arthritis [M19.90]   . Anxiety [F41.9]   . GERD (gastroesophageal reflux disease) [K21.9]   . Meniere disease [H81.09]   . Osteoporosis [M81.0]   . Hearing impaired [H91.90]     Total Time spent with patient: 30 minutes  Subjective:   Elizabeth Stone is a 71 y.o. female patient admitted with an anxiety attack that she thought was a heart attack.   HPI: Per tele assessment note on chart written by Faylene Kurtz, [facility] Counselor: Elizabeth Stone is an 71 y.o. female BIB her son, Elizabeth Stone, who sts that pt was discharged from [facility] yesterday after a 6 days stay. Per son, today pt sought out pills she had hidden from him and overdosed. It is unclear to son and pt denied an intentional OD. Per son, today pt started to c/o chest pain. Per son, pt's behavior became erratic and angry so, he brought he to the ED for further evaluation. Pt was hospitalized 6 days ago due to making threats she would kill herself during an altercation with her son. Pt currently denies SI, HI, SHI and AVH at this time. Per son, pt uses cannabis daily, drinks excessive amounts of alcohol daily and abuses her prescribed medications (Hydorcodone, Klonopin among others.) Son sts he and pt's daughter, his sister, are seeking pt's admission to a substance rehab facility currently once pt is stablizied  psychiatrically. Pt has a hx of Bipolar D/O per son's report and multiple chronic health conditions that upset her and tend to isolate pt, especially at night. Per son, pt has turned to substances use and abuse to ease her loneliness. Pt sts she does struggle with loneliness, although she sts she is involved in many groups and activities throughout the community, her church and with her friends. Pt sts that her current stressors include continuing grief over her husband's death 3 yrs ago and her continuing health issues. Pt's symptoms of depression including sadness, fatigue, decreased self esteem, self isolation, irritability, negative outlook, difficulty thinking & concentrating, feeling helpless and hopeless at times, sleep and eating disturbances. Pt denies symptoms of anxiety but per son, pt has had increasing panic attack in the last year. Pt has been seeing [facility] for psychiatric medication management but sts she stopped seeing her threrapist about the time her husband died. Pt sts she once was a pt at [facility].   Pt has lives alone since her husband of 23 years died 3 years ago. Her son lives in [place] and he and his wife are a support for pt. Pt also has a daughter who lives out-of-state who provides emotional support. 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 Pt has 2 son, 1 daughter and a step-daughter whom she cares about. Pt tested positive tonight in the ED for opiates and THC. Pt's BAL was <5. Per son, pt drinks about 1/2 a fifth of liquor per  day, smokes cannabis twice a day and abuses her prescription psychiatric medications, continually overdosing herself. Per son, pt has had substance abuse issues since her husband died. Pt denies any hx of physical, emotional, verbal or sexual abuse. Pt denies any hx of legal issues past or present. Pt denies any hx of angry issues or aggression. Pt sts she has not slept in 6 days (while IP) and has only had a small amount to eat for  the last 6 days. Pt was documented to have an unstable gait and hasseveral health conditions which may make her a fall risk.  Pt is hearing impaired and hears with use of an implant.   Pt was dressed in hospital gown. Pt was alert, minimally cooperative and irritable. Pt kept good eye contact, spoke in a slurred tone and at a normal pace. Pt moved in a normal manner when moving. Pt's thought process was coherent and relevant and judgement was impaired.  No indication of delusional thinking or response to internal stimuli. Pt's mood was stated to be neither depressed nor anxious and her blunted affect was incongruent.  Pt was oriented x 4, to person, place, time and situation.   Diagnosis: Bipolar D/O by hx  Today during tele psych consult: Elizabeth Stone is a 71 year old female who was BIB her son Elizabeth Stone, to the [facility] with altered mental status due to opiates and marijuana.  Pt denies homicidal ideation, denies auditory/visual hallucinations but does appear to be responding to internal stimuli at times. Pt stated she has thought about suicide in the past but not today. Pt was presenting in a bizarre manner during tele psych and stated she has jet engines roaring in her ears, waving a washcloth around and placing it over her face, and acting very agitated. Pt stated she had to be at work at 1:00pm and that she was worried sick about her son because he caught his wife with another man and he isn't in his right mind. Pt's UDS + for opiates and marijuana. Pt is hard of hearing but reads lips. Pt has home meds listed as clonazepam and oxycodone, this writer checked [place] for last prescribed doses and found no controlled substances prescribed for Pt.  Pt gave this writer permission to contact her son for collateral. Elizabeth Stone at [phone].  Spoke with Elizabeth Stone regarding his mother and events that led up to her being in the [facility]. Elizabeth Stone states "my mother was discharged from [facility] yesterday and as soon as  she was home she took pills in attempt to harm herself.She always denies that she is trying to hurt herself though.  I thought I had gotten all the medications out of the house but she must have had some hidden. I noticed her demeanor change and her words were slurred and she looked like she was going to pass out. My sister and I are so worried that she is going to overdose and we want her to get help. My mother smokes marijuana every day too. My mother does not work. She will tell people anything they want to hear to get what she wants so she can medicate herself. There is a long history of suicide in the family."    Past Psychiatric History:   Risk to Self: Suicidal Ideation: No (denies) Suicidal Intent: No (denies) Is patient at risk for suicide?:  (undetermined) Suicidal Plan?: No (denies) Access to Means: Yes Specify Access to Suicidal Means:  (pt sts she has access to  1 of her deceased husband's rifles) What has been your use of drugs/alcohol within the last 12 months?:  (daily use of multiple substances) How many times?:  (0) Other Self Harm Risks:  (none) Triggers for Past Attempts: None known Intentional Self Injurious Behavior: None Risk to Others: Homicidal Ideation: No (denies) Thoughts of Harm to Others: No Current Homicidal Intent: No Current Homicidal Plan: No Access to Homicidal Means: No Identified Victim:  (none) History of harm to others?: No Assessment of Violence: None Noted Violent Behavior Description:  (na) Does patient have access to weapons?: Yes (Comment) (access to 1 of husband's rifles) Criminal Charges Pending?: No Does patient have a court date: No Prior Inpatient Therapy: Prior Inpatient Therapy: Yes Prior Therapy Dates:  (Discharged 10/22/2016 after 6 days IP) Prior Therapy Facilty/Provider(s):  ([facility]) Reason for Treatment:  (SI) Prior Outpatient Therapy: Prior Outpatient Therapy: No Does patient have an ACCT team?: No Does patient have  Intensive In-House Services?  : No Does patient have [facility] services? : No Does patient have P4CC services?: No  Past Medical History:  Past Medical History:  Diagnosis Date  . Acute blood loss anemia   . Anxiety   . Arthritis   . Bipolar disorder ([place])   . Depression   . Fibromyalgia   . GERD (gastroesophageal reflux disease)   . Hearing impaired    has cochlear implant  . HTN (hypertension)   . Hypokalemia   . Meniere disease    takes HCTZ for her inner ear problem  . Mixed hyperlipidemia 09/27/2013  . Osteoporosis   . Primary localized osteoarthritis of right knee   . Slow transit constipation   . Unsteady gait     Past Surgical History:  Procedure Laterality Date  . ABDOMINAL HYSTERECTOMY    . bladder tack    . COCHLEAR IMPLANT     2007  . COLONOSCOPY    . HAMMER TOE SURGERY     left foot  . REVERSE SHOULDER ARTHROPLASTY Right 12/13/2014   dr Tamera Punt  . REVERSE SHOULDER ARTHROPLASTY Right 12/13/2014   Procedure: REVERSE SHOULDER ARTHROPLASTY;  Surgeon: Nita Sells, MD;  Location: [facility];  Service: Orthopedics;  Laterality: Right;  Right reverse total shoulder replacement  . TOTAL KNEE ARTHROPLASTY Right 12/16/2015   Procedure: TOTAL KNEE ARTHROPLASTY;  Surgeon: Elsie Saas, MD;  Location: [facility];  Service: Orthopedics;  Laterality: Right;   Family History:  Family History  Problem Relation Age of Onset  . Heart disease Mother   . Breast cancer Mother   . Cervical cancer Mother   . Diabetes Mother   . Heart disease Father   . Diabetes Maternal Aunt   . Colon cancer Neg Hx   . Esophageal cancer Neg Hx   . Rectal cancer Neg Hx   . Stomach cancer Neg Hx    Family Psychiatric  History: Completed suicide attempts by multiple family members  Social History:  History  Alcohol Use  . 6.0 oz/week  . 10 Shots of liquor per week     History  Drug Use No    Social History   Social History  . Marital status: Widowed    Spouse name: N/A  .  Number of children: N/A  . Years of education: N/A   Social History Main Topics  . Smoking status: Former Smoker    Packs/day: 0.25    Years: 23.00    Quit date: 08/25/1993  . Smokeless tobacco: Never Used  . Alcohol  use 6.0 oz/week    10 Shots of liquor per week  . Drug use: No  . Sexual activity: Not Asked   Other Topics Concern  . None   Social History Narrative  . None   Additional Social History:    Allergies:   Allergies  Allergen Reactions  . Klonopin [Clonazepam] Other (See Comments)    agitation  . Latex Hives and Rash    Labs:  Results for orders placed or performed during the hospital encounter of 10/22/16 (from the past 48 hour(s))  Basic metabolic panel     Status: Abnormal   Collection Time: 10/22/16  3:51 PM  Result Value Ref Range   Sodium 134 (L) 135 - 145 mmol/L   Potassium 5.1 3.5 - 5.1 mmol/L    Comment: HEMOLYSIS AT THIS LEVEL MAY AFFECT RESULT   Chloride 102 101 - 111 mmol/L   CO2 25 22 - 32 mmol/L   Glucose, Bld 90 65 - 99 mg/dL   BUN 15 6 - 20 mg/dL   Creatinine, Ser 0.91 0.44 - 1.00 mg/dL   Calcium 10.2 8.9 - 10.3 mg/dL   GFR calc non Af Amer >60 >60 mL/min   GFR calc Af Amer >60 >60 mL/min    Comment: (NOTE) The eGFR has been calculated using the CKD EPI equation. This calculation has not been validated in all clinical situations. eGFR's persistently <60 mL/min signify possible Chronic Kidney Disease.    Anion gap 7 5 - 15  CBC     Status: None   Collection Time: 10/22/16  3:51 PM  Result Value Ref Range   WBC 6.5 4.0 - 10.5 K/uL   RBC 4.22 3.87 - 5.11 MIL/uL   Hemoglobin 13.8 12.0 - 15.0 g/dL   HCT 39.6 36.0 - 46.0 %   MCV 93.8 78.0 - 100.0 fL   MCH 32.7 26.0 - 34.0 pg   MCHC 34.8 30.0 - 36.0 g/dL   RDW 12.1 11.5 - 15.5 %   Platelets 211 150 - 400 K/uL  I-stat troponin, ED     Status: None   Collection Time: 10/22/16  4:10 PM  Result Value Ref Range   Troponin i, poc 0.00 0.00 - 0.08 ng/mL   Comment 3            Comment:  Due to the release kinetics of cTnI, a negative result within the first hours of the onset of symptoms does not rule out myocardial infarction with certainty. If myocardial infarction is still suspected, repeat the test at appropriate intervals.   Ethanol     Status: None   Collection Time: 10/22/16  5:22 PM  Result Value Ref Range   Alcohol, Ethyl (B) <5 <5 mg/dL    Comment:        LOWEST DETECTABLE LIMIT FOR SERUM ALCOHOL IS 5 mg/dL FOR MEDICAL PURPOSES ONLY   Rapid urine drug screen (hospital performed)     Status: Abnormal   Collection Time: 10/22/16  6:01 PM  Result Value Ref Range   Opiates POSITIVE (A) NONE DETECTED   Cocaine NONE DETECTED NONE DETECTED   Benzodiazepines NONE DETECTED NONE DETECTED   Amphetamines NONE DETECTED NONE DETECTED   Tetrahydrocannabinol POSITIVE (A) NONE DETECTED   Barbiturates NONE DETECTED NONE DETECTED    Comment:        DRUG SCREEN FOR MEDICAL PURPOSES ONLY.  IF CONFIRMATION IS NEEDED FOR ANY PURPOSE, NOTIFY LAB WITHIN 5 DAYS.        LOWEST DETECTABLE  LIMITS FOR URINE DRUG SCREEN Drug Class       Cutoff (ng/mL) Amphetamine      1000 Barbiturate      200 Benzodiazepine   333 Tricyclics       545 Opiates          300 Cocaine          300 THC              50     Current Facility-Administered Medications  Medication Dose Route Frequency Provider Last Rate Last Dose  . acetaminophen (TYLENOL) tablet 650 mg  650 mg Oral Q4H PRN Courteney Lyn Mackuen, MD   650 mg at 10/23/16 0222  . hydrOXYzine (ATARAX/VISTARIL) tablet 25 mg  25 mg Oral TID PRN Isla Pence, MD   25 mg at 10/23/16 6256  . ibuprofen (ADVIL,MOTRIN) tablet 600 mg  600 mg Oral Q8H PRN Courteney Lyn Mackuen, MD      . ondansetron (ZOFRAN) tablet 4 mg  4 mg Oral Q8H PRN Courteney Lyn Mackuen, MD       Current Outpatient Prescriptions  Medication Sig Dispense Refill  . aspirin 81 MG chewable tablet Chew 81 mg by mouth daily.    Marland Kitchen atorvastatin (LIPITOR) 10 MG tablet Take  10 mg by mouth daily at 6 PM.    . calcium carbonate (OS-CAL) 600 MG TABS tablet Take 1,200 mg by mouth daily with breakfast.     . celecoxib (CELEBREX) 200 MG capsule Take 200 mg by mouth at bedtime.     . Cholecalciferol (VITAMIN D-3 PO) Take 1,000 Units by mouth daily.    . DULoxetine (CYMBALTA) 60 MG capsule Take 60 mg by mouth at bedtime.     . hydrochlorothiazide (HYDRODIURIL) 25 MG tablet Take 25 mg by mouth Daily.     Marland Kitchen KLOR-CON M20 20 MEQ tablet Take 20 mEq by mouth daily.     Marland Kitchen lamoTRIgine (LAMICTAL) 100 MG tablet Take 100 mg by mouth at bedtime.    . mirtazapine (REMERON) 15 MG tablet Take 7.5 mg by mouth at bedtime.    . montelukast (SINGULAIR) 10 MG tablet Take 10 mg by mouth at bedtime as needed (allergies).     . OLANZapine (ZYPREXA) 5 MG tablet Take 5 mg by mouth at bedtime.    . Omega-3 Fatty Acids (FISH OIL) 1200 MG CAPS Take 1,200 mg by mouth daily.     . polyethylene glycol (MIRALAX / GLYCOLAX) packet Take 17 g by mouth daily as needed for moderate constipation.     . sennosides-docusate sodium (SENOKOT-S) 8.6-50 MG tablet Take 1 tablet by mouth daily as needed for constipation.     Marland Kitchen venlafaxine XR (EFFEXOR-XR) 150 MG 24 hr capsule Take 150 mg by mouth at bedtime.    . vitamin C (ASCORBIC ACID) 500 MG tablet Take 500 mg by mouth 2 (two) times daily.      Musculoskeletal: Unable to assess: camera  Psychiatric Specialty Exam: Physical Exam  Review of Systems  HENT: Positive for hearing loss.   Psychiatric/Behavioral: Positive for depression, substance abuse and suicidal ideas. Negative for hallucinations and memory loss. The patient is nervous/anxious. The patient does not have insomnia.   All other systems reviewed and are negative.   Blood pressure 129/69, pulse 86, temperature 98.3 F (36.8 C), temperature source Oral, resp. rate 18, SpO2 98 %.There is no height or weight on file to calculate BMI.  General Appearance: Disheveled  Eye Contact:  Fair  Speech:  Normal Rate and Slurred  Volume:  Increased  Mood:  Angry, Anxious, Depressed and Dysphoric  Affect:  Blunt, Congruent and Depressed  Thought Process:  Disorganized  Orientation:  Full (Time, Place, and Person)  Thought Content:  Illogical  Suicidal Thoughts:  Yes.  without intent/plan  Homicidal Thoughts:  No  Memory:  Immediate;   Fair Recent;   Fair Remote;   Fair  Judgement:  Fair  Insight:  Lacking  Psychomotor Activity:  Increased  Concentration:  Concentration: Fair and Attention Span: Fair  Recall:  [employer] of Knowledge:  Good  Language:  Good  Akathisia:  No  Handed:  Right  AIMS (if indicated):     Assets:  Agricultural consultant Housing Resilience Social Support Transportation Vocational/Educational  ADL's:  Intact  Cognition:  WNL  Sleep:        Treatment Plan Summary: Daily contact with patient to assess and evaluate symptoms and progress in treatment and Medication management  Disposition: Recommend psychiatric Inpatient admission when medically cleared.  TTS to seek placement.   Ethelene Hal, NP 10/23/2016 9:58 AM

## 2016-10-23 NOTE — ED Notes (Signed)
Pt out of room and to the restroom. As patient came out, pt stated to this RN "I need something for anxiety I'm having another anxiety attack" Pt appears to be in NAD. Pt given 25mg  of atarax by this RN

## 2016-10-23 NOTE — ED Notes (Signed)
Pt was very verbal and demanding about shutting the door. I explained that the policy is that the door stay open so that I can observe her and make sure she is ok. She was very hateful and atleast wanted it closed a little. She was talking to herself and rude.

## 2016-10-23 NOTE — ED Notes (Signed)
Patient has Hearing aid in at this time. Patient batteries and equipment at nursing station.

## 2016-10-24 NOTE — ED Notes (Signed)
Pt was given a sprite and saltines as a snack.

## 2016-10-24 NOTE — ED Notes (Signed)
Pt watching tv and eating lunch.

## 2016-10-24 NOTE — ED Notes (Signed)
Daughter is here from [place]. Advised she may visit for few more minutes.

## 2016-10-24 NOTE — ED Notes (Signed)
Vanilla milkshake given to pt for snack as requested - milk and vanilla ice cream.

## 2016-10-24 NOTE — ED Notes (Signed)
Pt's labeled bag w/cochlear implant products placed on counter w/batteries x 3 - charging. Per report, pt requested for staff to take last pm and charge them for her.

## 2016-10-24 NOTE — ED Notes (Signed)
Pt's daughter removed 1 hearing aid from bag per pt request - needs to be "flushed out".

## 2016-10-24 NOTE — ED Notes (Signed)
Pt's daughter and son-in-law visiting w/pt.

## 2016-10-24 NOTE — BH Assessment (Signed)
1026:  Patient reassessed:  Patient reports feeling well rested, however a little "groggy."  Patient denied SI, HI, or AVH.  Patient denied experiencing depression or anxiety.  Patient reports feeling medication regime is beneficial and the noise form hearing processor has lessen.  Patient reports children are coming to visit her today and that she would like to go home.

## 2016-10-24 NOTE — Progress Notes (Signed)
Patient was referred to the geriatric inpatient treatment program at [facility]. Pamala Hurry at [place] informed that she will take a look at referral as there is 1 bed available today.  CSW in disposition will continue to follow up and seek placement.  Verlon Setting, [place] Disposition staff 10/25/2016 12:24 PM

## 2016-10-24 NOTE — ED Notes (Signed)
Pt given snack at this time  

## 2016-10-24 NOTE — ED Notes (Addendum)
Eyeglasses on bedside table.  

## 2016-10-24 NOTE — ED Notes (Signed)
Pt c/o upset stomach

## 2016-10-24 NOTE — ED Notes (Signed)
Pt given dinner tray at this time.  

## 2016-10-24 NOTE — ED Notes (Signed)
Female family member visiting w/pt.

## 2016-10-24 NOTE — ED Notes (Signed)
Pt requested to shower. Pt ambulated to shower area. Pt showered independently, minimal assistance needed while dressing. Pt requested wheelchair assistance to return to bed. Pt was brought back to the room with wheelchair. Pt independent with oral care. Pt now resting in bed. Bed sheets changed this AM.

## 2016-10-24 NOTE — ED Notes (Signed)
Pt given Vistaril and Zofran d/t tearful while talking w/daughter and c/o unable to eat lunch d/t nausea.

## 2016-10-24 NOTE — ED Notes (Signed)
Daughter asking for additional Zofran for pt. Requested that she and pt give the medication time to work.

## 2016-10-24 NOTE — ED Notes (Signed)
Pt given meal tray.

## 2016-10-24 NOTE — ED Notes (Signed)
Pt has hearing aides as requested. Pt's 2 batteries remain on charter. Pt eating breakfast and is aware re-TTS to be performed. Pt noted to be calm, cooperative. States feels better this am after sleeping well last pm.

## 2016-10-25 DIAGNOSIS — Z96651 Presence of right artificial knee joint: Secondary | ICD-10-CM | POA: Diagnosis not present

## 2016-10-25 DIAGNOSIS — F332 Major depressive disorder, recurrent severe without psychotic features: Secondary | ICD-10-CM | POA: Diagnosis not present

## 2016-10-25 DIAGNOSIS — F1990 Other psychoactive substance use, unspecified, uncomplicated: Secondary | ICD-10-CM | POA: Diagnosis present

## 2016-10-25 DIAGNOSIS — Z9889 Other specified postprocedural states: Secondary | ICD-10-CM | POA: Diagnosis not present

## 2016-10-25 DIAGNOSIS — Z8249 Family history of ischemic heart disease and other diseases of the circulatory system: Secondary | ICD-10-CM | POA: Diagnosis not present

## 2016-10-25 DIAGNOSIS — M1990 Unspecified osteoarthritis, unspecified site: Secondary | ICD-10-CM | POA: Diagnosis not present

## 2016-10-25 DIAGNOSIS — R0789 Other chest pain: Secondary | ICD-10-CM | POA: Diagnosis not present

## 2016-10-25 DIAGNOSIS — H8109 Meniere's disease, unspecified ear: Secondary | ICD-10-CM | POA: Diagnosis not present

## 2016-10-25 DIAGNOSIS — F314 Bipolar disorder, current episode depressed, severe, without psychotic features: Secondary | ICD-10-CM | POA: Diagnosis not present

## 2016-10-25 MED ORDER — VITAMIN C 500 MG PO TABS
500.0000 mg | ORAL_TABLET | Freq: Two times a day (BID) | ORAL | Status: DC
  Administered 2016-10-25: 500 mg via ORAL
  Filled 2016-10-25: qty 1

## 2016-10-25 MED ORDER — ZOLPIDEM TARTRATE 5 MG PO TABS
5.0000 mg | ORAL_TABLET | Freq: Once | ORAL | Status: AC
Start: 2016-10-25 — End: 2016-10-25
  Administered 2016-10-25: 5 mg via ORAL
  Filled 2016-10-25: qty 1

## 2016-10-25 MED ORDER — POLYETHYLENE GLYCOL 3350 17 G PO PACK: 17.0000 g | PACK | Freq: Every day | ORAL | Status: DC | PRN

## 2016-10-25 MED ORDER — DULOXETINE HCL 60 MG PO CPEP: 60.0000 mg | ORAL_CAPSULE | Freq: Every day | ORAL | Status: DC

## 2016-10-25 MED ORDER — VENLAFAXINE HCL ER 150 MG PO CP24: 150.0000 mg | ORAL_CAPSULE | Freq: Every day | ORAL | Status: DC

## 2016-10-25 MED ORDER — VITAMIN D-3 125 MCG (5000 UT) PO TABS: 1000.0000 [IU] | ORAL_TABLET | Freq: Every day | ORAL | Status: DC

## 2016-10-25 MED ORDER — CALCIUM CARBONATE 1250 (500 CA) MG PO TABS: 1250.0000 mg | ORAL_TABLET | Freq: Every day | ORAL | Status: DC

## 2016-10-25 MED ORDER — POTASSIUM CHLORIDE CRYS ER 20 MEQ PO TBCR
20.0000 meq | EXTENDED_RELEASE_TABLET | Freq: Every day | ORAL | Status: DC
  Administered 2016-10-25: 20 meq via ORAL
  Filled 2016-10-25: qty 1

## 2016-10-25 MED ORDER — SENNA-DOCUSATE SODIUM 8.6-50 MG PO TABS: 1.0000 | ORAL_TABLET | Freq: Every day | ORAL | Status: DC | PRN

## 2016-10-25 MED ORDER — MIRTAZAPINE 15 MG PO TABS: 7.5000 mg | ORAL_TABLET | Freq: Every day | ORAL | Status: DC

## 2016-10-25 MED ORDER — MONTELUKAST SODIUM 10 MG PO TABS: 10.0000 mg | ORAL_TABLET | Freq: Every evening | ORAL | Status: DC | PRN

## 2016-10-25 MED ORDER — ATORVASTATIN CALCIUM 10 MG PO TABS: 10.0000 mg | ORAL_TABLET | Freq: Every day | ORAL | Status: DC

## 2016-10-25 MED ORDER — OLANZAPINE 5 MG PO TABS: 5.0000 mg | ORAL_TABLET | Freq: Every day | ORAL | Status: DC

## 2016-10-25 MED ORDER — SENNOSIDES-DOCUSATE SODIUM 8.6-50 MG PO TABS: 1.0000 | ORAL_TABLET | Freq: Every day | ORAL | Status: DC | PRN

## 2016-10-25 MED ORDER — CALCIUM CARBONATE 600 MG PO TABS: 1200.0000 mg | ORAL_TABLET | Freq: Every day | ORAL | Status: DC

## 2016-10-25 MED ORDER — CELECOXIB 200 MG PO CAPS
200.0000 mg | ORAL_CAPSULE | Freq: Every day | ORAL | Status: DC
  Filled 2016-10-25: qty 1

## 2016-10-25 MED ORDER — HYDROCHLOROTHIAZIDE 25 MG PO TABS
25.0000 mg | ORAL_TABLET | Freq: Every day | ORAL | Status: DC
  Administered 2016-10-25: 25 mg via ORAL
  Filled 2016-10-25: qty 1

## 2016-10-25 MED ORDER — VITAMIN D 1000 UNITS PO TABS
1000.0000 [IU] | ORAL_TABLET | Freq: Every day | ORAL | Status: DC
  Administered 2016-10-25: 1000 [IU] via ORAL
  Filled 2016-10-25 (×2): qty 1

## 2016-10-25 MED ORDER — LAMOTRIGINE 100 MG PO TABS: 100.0000 mg | ORAL_TABLET | Freq: Every day | ORAL | Status: DC

## 2016-10-25 MED ORDER — ASPIRIN 81 MG PO CHEW
81.0000 mg | CHEWABLE_TABLET | Freq: Every day | ORAL | Status: DC
  Administered 2016-10-25: 81 mg via ORAL
  Filled 2016-10-25: qty 1

## 2016-10-25 NOTE — ED Notes (Signed)
Pt given new ice pack °

## 2016-10-25 NOTE — ED Notes (Addendum)
Daughter, Mickel Baas, aware pt has been accepted to [facility]. Advised she will pick up pt's belongings tonight or tomorrow - gown - in labeled belongings bag. Placed at nurses' desk. Pt requested for daughter to pick up also. Pt has eyeglasses and case - [place] given pt's cochlear items, charger and batteries.

## 2016-10-25 NOTE — ED Notes (Signed)
Pt attempting to call her children - no answer.

## 2016-10-25 NOTE — Progress Notes (Signed)
Patient has been accepted at [facility] in [place], per [place]. Accepting Dr. Arnette Norris, to room 52-1, call report to [phone]. Please have the patient arrive before 23:00 or after 6:00. [facility] RN Wells Guiles aware.  Verlon Setting, [place] Disposition staff 10/25/2016 4:02 PM

## 2016-10-25 NOTE — ED Notes (Signed)
Breakfast tray delivered

## 2016-10-25 NOTE — ED Notes (Signed)
Recliner placed in pt's room as requested. Pt appears to be upset d/t aware she is to stay in ED and wait for inpt placement. Pt given Zofran as requested.

## 2016-10-25 NOTE — ED Notes (Signed)
Dr Winfred Leeds pt asking for home meds to be ordered.

## 2016-10-25 NOTE — Progress Notes (Signed)
Patient has been referred to [facility] for geriatric inpatient treatment.  Pamala Hurry from [place] called Probation officer and informed that referral has been received and is now being reviewed.  Verlon Setting, [place] Disposition staff 10/25/2016 1:43 PM

## 2016-10-25 NOTE — ED Provider Notes (Addendum)
Sleeping comfortably. Stable.   Elizabeth Dakin, MD 10/25/16 0900  4:05 PM patient is alert ambulate without difficulty. She is cooperative. Stable for transfer to psychiatric facility Dr Arnette Norris is accepting physician    Elizabeth Dakin, MD 10/25/16 [phone]

## 2016-10-25 NOTE — ED Notes (Signed)
Pt up to restroom.  Requesting for something to help her sleep because she got woken up by the noise in the hall.  Will request from MD.

## 2016-10-25 NOTE — ED Notes (Addendum)
Left message for pt's son [name]  [phone]. Attempted to call daughter - Mickel Baas - no answer. Pt aware.

## 2016-10-25 NOTE — Consult Note (Signed)
Telepsych Consultation   Reason for Consult:  Altered Mental Status Referring Physician:  EDP Patient Identification: Elizabeth Stone MRN:  [id number] Principal Diagnosis: Depression Diagnosis:   Patient Active Problem List   Diagnosis Date Noted  . Depression [F32.9]     Priority: High  . Primary localized osteoarthritis of right knee [M17.11]   . Rotator cuff tear arthropathy [M12.819] 12/13/2014  . Ulcer of right second toe ([place]) [L97.519] 12/21/2013  . Family history of ischemic heart disease [Z82.49] 09/27/2013  . Essential hypertension, benign [I10] 09/27/2013  . Mixed hyperlipidemia [E78.2] 09/27/2013  . Arthritis [M19.90]   . Anxiety [F41.9]   . GERD (gastroesophageal reflux disease) [K21.9]   . Meniere disease [H81.09]   . Osteoporosis [M81.0]   . Hearing impaired [H91.90]     Total Time spent with patient: 30 minutes  Subjective:   Elizabeth BODOH is a 71 y.o. female patient admitted with altered mental status.  HPI:  Per tele psych consult note on chart on 10-23-16:  Elizabeth Stone is a 71 year old female who was BIB her son Elizabeth Stone, to the [facility] with altered mental status due to opiates and marijuana.  Pt denies homicidal ideation, denies auditory/visual hallucinations but does appear to be responding to internal stimuli at times. Pt stated she has thought about suicide in the past but not today. Pt was presenting in a bizarre manner during tele psych and stated she has jet engines roaring in her ears, waving a washcloth around and placing it over her face, and acting very agitated. Pt stated she had to be at work at 1:00pm and that she was worried sick about her son because he caught his wife with another man and he isn't in his right mind. Pt's UDS + for opiates and marijuana. Pt is hard of hearing but reads lips. Pt has home meds listed as clonazepam and oxycodone, this writer checked [place] for last prescribed doses and found no controlled substances prescribed for Pt.  Pt gave this  writer permission to contact her son for collateral. Jaynie Collins at [phone].  Spoke with Elizabeth Stone regarding his mother and events that led up to her being in the [facility]. John states "my mother was discharged from [facility] yesterday and as soon as she was home she took pills in attempt to harm herself.She always denies that she is trying to hurt herself though.  I thought I had gotten all the medications out of the house but she must have had some hidden. I noticed her demeanor change and her words were slurred and she looked like she was going to pass out. My sister and I are so worried that she is going to overdose and we want her to get help. My mother smokes marijuana every day too. My mother does not work. She will tell people anything they want to hear to get what she wants so she can medicate herself. There is a long history of suicide in the family."    Today during re-assessment:  Elizabeth Stone is a 71 year old female. Pt was brought to the [facility] by her son Elizabeth Stone after she became altered at home. Pt was released from [facility] after a 6 day stay and upon arriving home, according to her son, she apparently had some medication hidden which she took and then began slurring her words and looked as if she was going to pass out. This Probation officer spoke with Pt's son on 10-23-16 and he stated that  she is very good at telling people what they want to hear so she can medicate herself.  Pt's son also stated that there have been three people in the immediate family who have successfully completed suicide and his mother has expressed these thoughts to him at times but then she always denies that she said this. Today, Pt denies suicidal/homicidal ideation, denies auditory/visual hallucinations and does not appear to be responding to internal stimuli. Pt was calm and cooperative, alert & oriented x 3, dressed in paper scrubs, sitting on the bed and eating her breakfast. Pt was appropriate in her mannerisms. Pt is  hard of hearing and wears a cochlear implant.  Pt denies taking any pills and denies having medication hidden. [place] Controlled Substance Registry shows no controlled substances prescribed for this Pt. Pt stated that her son gives her her medications. Pt's UDS was positive for Opiates and THC upon admission. Pt has a high degree of secondary gain by requesting discharge in order to have access to medications that the family is unable to locate and destroy. Pt has a high degree of risk factors given the family history of multiple completed suicides, access to controlled substances not prescribed to her, and transportation and money to purchase drugs.   Past Psychiatric History: Depression, Anxiety  Risk to Self: Suicidal Ideation: No (denies) Suicidal Intent: No (denies) Is patient at risk for suicide?:  (undetermined) Suicidal Plan?: No (denies) Access to Means: Yes Specify Access to Suicidal Means:  (pt sts she has access to 1 of her deceased husband's rifles) What has been your use of drugs/alcohol within the last 12 months?:  (daily use of multiple substances) How many times?:  (0) Other Self Harm Risks:  (none) Triggers for Past Attempts: None known Intentional Self Injurious Behavior: None Risk to Others: Homicidal Ideation: No (denies) Thoughts of Harm to Others: No Current Homicidal Intent: No Current Homicidal Plan: No Access to Homicidal Means: No Identified Victim:  (none) History of harm to others?: No Assessment of Violence: None Noted Violent Behavior Description:  (na) Does patient have access to weapons?: Yes (Comment) (access to 1 of husband's rifles) Criminal Charges Pending?: No Does patient have a court date: No Prior Inpatient Therapy: Prior Inpatient Therapy: Yes Prior Therapy Dates:  (Discharged 10/22/2016 after 6 days IP) Prior Therapy Facilty/Provider(s):  ([facility]) Reason for Treatment:  (SI) Prior Outpatient Therapy: Prior Outpatient Therapy:  No Does patient have an ACCT team?: No Does patient have Intensive In-House Services?  : No Does patient have [facility] services? : No Does patient have P4CC services?: No  Past Medical History:  Past Medical History:  Diagnosis Date  . Acute blood loss anemia   . Anxiety   . Arthritis   . Bipolar disorder ([place])   . Depression   . Fibromyalgia   . GERD (gastroesophageal reflux disease)   . Hearing impaired    has cochlear implant  . HTN (hypertension)   . Hypokalemia   . Meniere disease    takes HCTZ for her inner ear problem  . Mixed hyperlipidemia 09/27/2013  . Osteoporosis   . Primary localized osteoarthritis of right knee   . Slow transit constipation   . Unsteady gait     Past Surgical History:  Procedure Laterality Date  . ABDOMINAL HYSTERECTOMY    . bladder tack    . COCHLEAR IMPLANT     2007  . COLONOSCOPY    . HAMMER TOE SURGERY     left foot  .  REVERSE SHOULDER ARTHROPLASTY Right 12/13/2014   dr Tamera Punt  . REVERSE SHOULDER ARTHROPLASTY Right 12/13/2014   Procedure: REVERSE SHOULDER ARTHROPLASTY;  Surgeon: Nita Sells, MD;  Location: [place];  Service: Orthopedics;  Laterality: Right;  Right reverse total shoulder replacement  . TOTAL KNEE ARTHROPLASTY Right 12/16/2015   Procedure: TOTAL KNEE ARTHROPLASTY;  Surgeon: Elsie Saas, MD;  Location: [place];  Service: Orthopedics;  Laterality: Right;   Family History:  Family History  Problem Relation Age of Onset  . Heart disease Mother   . Breast cancer Mother   . Cervical cancer Mother   . Diabetes Mother   . Heart disease Father   . Diabetes Maternal Aunt   . Colon cancer Neg Hx   . Esophageal cancer Neg Hx   . Rectal cancer Neg Hx   . Stomach cancer Neg Hx    Family Psychiatric  History: Completed suicide by family 3 family members.  Social History:  History  Alcohol Use  . 6.0 oz/week  . 10 Shots of liquor per week     History  Drug Use No    Social History   Social History  .  Marital status: Widowed    Spouse name: N/A  . Number of children: N/A  . Years of education: N/A   Social History Main Topics  . Smoking status: Former Smoker    Packs/day: 0.25    Years: 23.00    Quit date: 08/25/1993  . Smokeless tobacco: Never Used  . Alcohol use 6.0 oz/week    10 Shots of liquor per week  . Drug use: No  . Sexual activity: Not Asked   Other Topics Concern  . None   Social History Narrative  . None   Additional Social History:    Allergies:   Allergies  Allergen Reactions  . Klonopin [Clonazepam] Other (See Comments)    agitation  . Latex Hives and Rash    Labs: No results found for this or any previous visit (from the past 48 hour(s)).  Current Facility-Administered Medications  Medication Dose Route Frequency Provider Last Rate Last Dose  . acetaminophen (TYLENOL) tablet 650 mg  650 mg Oral Q4H PRN Courteney Lyn Mackuen, MD   650 mg at 10/25/16 0255  . hydrOXYzine (ATARAX/VISTARIL) tablet 25 mg  25 mg Oral TID PRN Isla Pence, MD   25 mg at 10/24/16 1303  . hydrOXYzine (ATARAX/VISTARIL) tablet 50 mg  50 mg Oral QHS PRN Blanchie Dessert, MD   50 mg at 10/24/16 2135  . ibuprofen (ADVIL,MOTRIN) tablet 600 mg  600 mg Oral Q8H PRN Courteney Lyn Mackuen, MD      . ondansetron (ZOFRAN) tablet 4 mg  4 mg Oral Q8H PRN Courteney Lyn Mackuen, MD   4 mg at 10/24/16 2052   Current Outpatient Prescriptions  Medication Sig Dispense Refill  . aspirin 81 MG chewable tablet Chew 81 mg by mouth daily.    Marland Kitchen atorvastatin (LIPITOR) 10 MG tablet Take 10 mg by mouth daily at 6 PM.    . calcium carbonate (OS-CAL) 600 MG TABS tablet Take 1,200 mg by mouth daily with breakfast.     . celecoxib (CELEBREX) 200 MG capsule Take 200 mg by mouth at bedtime.     . Cholecalciferol (VITAMIN D-3 PO) Take 1,000 Units by mouth daily.    . DULoxetine (CYMBALTA) 60 MG capsule Take 60 mg by mouth at bedtime.     . hydrochlorothiazide (HYDRODIURIL) 25 MG tablet Take 25 mg by  mouth  Daily.     Marland Kitchen KLOR-CON M20 20 MEQ tablet Take 20 mEq by mouth daily.     Marland Kitchen lamoTRIgine (LAMICTAL) 100 MG tablet Take 100 mg by mouth at bedtime.    . mirtazapine (REMERON) 15 MG tablet Take 7.5 mg by mouth at bedtime.    . montelukast (SINGULAIR) 10 MG tablet Take 10 mg by mouth at bedtime as needed (allergies).     . OLANZapine (ZYPREXA) 5 MG tablet Take 5 mg by mouth at bedtime.    . Omega-3 Fatty Acids (FISH OIL) 1200 MG CAPS Take 1,200 mg by mouth daily.     . polyethylene glycol (MIRALAX / GLYCOLAX) packet Take 17 g by mouth daily as needed for moderate constipation.     . sennosides-docusate sodium (SENOKOT-S) 8.6-50 MG tablet Take 1 tablet by mouth daily as needed for constipation.     Marland Kitchen venlafaxine XR (EFFEXOR-XR) 150 MG 24 hr capsule Take 150 mg by mouth at bedtime.    . vitamin C (ASCORBIC ACID) 500 MG tablet Take 500 mg by mouth 2 (two) times daily.      Musculoskeletal: Unable to assess: camera  Psychiatric Specialty Exam: Physical Exam  Review of Systems  Psychiatric/Behavioral: Positive for depression and substance abuse (opiates and marijuana). Negative for hallucinations, memory loss and suicidal ideas. The patient is nervous/anxious. The patient does not have insomnia.   All other systems reviewed and are negative.   Blood pressure 119/55, pulse 79, temperature 98 F (36.7 C), resp. rate 16, SpO2 99 %.There is no height or weight on file to calculate BMI.  General Appearance: Casual  Eye Contact:  Good  Speech:  Clear and Coherent and Slow  Volume:  Normal  Mood:  Anxious and Depressed  Affect:  Congruent  Thought Process:  Coherent  Orientation:  Full (Time, Place, and Person)  Thought Content:  Illogical, untruthful about medications taken prior to admission.  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   Disposition: Recommend psychiatric Inpatient admission when medically cleared. Supportive therapy provided about ongoing stressors.  TTS to continue to seek placement.   Ethelene Hal, NP 10/25/2016 9:57 AM

## 2016-10-25 NOTE — ED Notes (Signed)
Spoke w/pt extensively re: acceptance to [facility]. States she will voluntarily go to [facility] and follow through as requested. Aware if not, she will be IVC'd. Voiced understanding.

## 2016-10-26 DIAGNOSIS — F314 Bipolar disorder, current episode depressed, severe, without psychotic features: Secondary | ICD-10-CM | POA: Insufficient documentation

## 2016-10-26 DIAGNOSIS — R0789 Other chest pain: Secondary | ICD-10-CM | POA: Diagnosis not present

## 2016-10-26 DIAGNOSIS — R531 Weakness: Secondary | ICD-10-CM | POA: Diagnosis not present

## 2016-10-27 DIAGNOSIS — F314 Bipolar disorder, current episode depressed, severe, without psychotic features: Secondary | ICD-10-CM | POA: Diagnosis not present

## 2016-10-28 DIAGNOSIS — F314 Bipolar disorder, current episode depressed, severe, without psychotic features: Secondary | ICD-10-CM | POA: Diagnosis not present

## 2016-11-04 ENCOUNTER — Ambulatory Visit: Payer: Medicare Other | Admitting: Cardiology

## 2016-11-10 DIAGNOSIS — Z803 Family history of malignant neoplasm of breast: Secondary | ICD-10-CM | POA: Diagnosis not present

## 2016-11-10 DIAGNOSIS — Z1231 Encounter for screening mammogram for malignant neoplasm of breast: Secondary | ICD-10-CM | POA: Diagnosis not present

## 2016-11-12 ENCOUNTER — Encounter: Payer: Self-pay | Admitting: *Deleted

## 2016-11-17 ENCOUNTER — Encounter: Payer: Self-pay | Admitting: Cardiology

## 2016-11-17 ENCOUNTER — Ambulatory Visit (INDEPENDENT_AMBULATORY_CARE_PROVIDER_SITE_OTHER): Payer: Medicare Other | Admitting: Cardiology

## 2016-11-17 VITALS — BP 116/64 | HR 90 | Ht 60.0 in | Wt 156.4 lb

## 2016-11-17 DIAGNOSIS — E782 Mixed hyperlipidemia: Secondary | ICD-10-CM

## 2016-11-17 DIAGNOSIS — Z8249 Family history of ischemic heart disease and other diseases of the circulatory system: Secondary | ICD-10-CM | POA: Diagnosis not present

## 2016-11-17 DIAGNOSIS — Z01810 Encounter for preprocedural cardiovascular examination: Secondary | ICD-10-CM

## 2016-11-17 DIAGNOSIS — I10 Essential (primary) hypertension: Secondary | ICD-10-CM | POA: Diagnosis not present

## 2016-11-17 NOTE — Progress Notes (Signed)
[facility]. [address], [address], [place]  [address] Phone: [phone] Fax:  [phone]  Date:  11/17/2016   ID:  Elizabeth Stone, Elizabeth Stone 29-Jan-1946, MRN [id number]  PCP:  Blanchie Serve, MD   History of Present Illness: Elizabeth Stone is a 71 y.o. female with hypertension, hyperlipidemia here for follow up with family history of heart disease. She had stress test, ETT in 12/12,  which was low risk, 69min, no ECG changes. Mother had a few MI's had pacer. Father had MI 65's. Pacer as well.   Able to work on treadmill, bike, step class. With trainer. One prior stress test there was a "problem" years ago. Sounds like she may have had an SVT that autoconverted. Dr. Jaci Standard has seen her in the past and stated that she did not have mitral valve prolapse.  Unfortunately, lost her husband in August of 2014 due to a massive stroke. The night before, she states that they [place] dinner out [place] They have not done that for 10 years. They had a wonderful time.  Hyperlipidemia-I have reviewed her lab work. See below.  She had a slowly healing right second toe wound. She saw Dr. Gwenlyn Found, no further workup necessary. Good overall pulses.  11/15/14- Over the last 8 months she has noted increased diaphoresis with simple exertion. She is also noted some mild increase dyspnea on exertion. This is not significant dyspnea. She does not have any chest pain.  11/17/16- Had an inpatient psychiatric admission on 10/22/16 with atypical chest pain in the setting of bipolar/depression. Felt a little bit of chest heaviness that disappeared. No diaphoresis. Marijuana, opiates, Klonopin. Shoulder replacement - Tania Ade MD. Dr. Noemi Chapel did knee replacement last year. She's having no chest pain, no further shortness of breath, no syncope, no orthopnea, no bleeding. Exercise treadmill test previously done was low risk with no electrocardiographic evidence of ischemia  Wt Readings from  Last 3 Encounters:  11/17/16 156 lb 6.4 oz (70.9 kg)  01/01/16 143 lb (64.9 kg)  12/20/15 141 lb (64 kg)     Past Medical History:  Diagnosis Date  . Acute blood loss anemia   . Anxiety   . Arthritis   . Bipolar disorder ([place])   . Depression   . Fibromyalgia   . GERD (gastroesophageal reflux disease)   . Hearing impaired    has cochlear implant  . HTN (hypertension)   . Hypokalemia   . Meniere disease    takes HCTZ for her inner ear problem  . Mixed hyperlipidemia 09/27/2013  . Osteoporosis   . Primary localized osteoarthritis of right knee   . Slow transit constipation   . Unsteady gait     Past Surgical History:  Procedure Laterality Date  . ABDOMINAL HYSTERECTOMY    . bladder tack    . COCHLEAR IMPLANT     2007  . COLONOSCOPY    . HAMMER TOE SURGERY     left foot  . REVERSE SHOULDER ARTHROPLASTY Right 12/13/2014   dr Tamera Punt  . REVERSE SHOULDER ARTHROPLASTY Right 12/13/2014   Procedure: REVERSE SHOULDER ARTHROPLASTY;  Surgeon: Nita Sells, MD;  Location: [place];  Service: Orthopedics;  Laterality: Right;  Right reverse total shoulder replacement  . TOTAL KNEE ARTHROPLASTY Right 12/16/2015   Procedure: TOTAL KNEE ARTHROPLASTY;  Surgeon: Elsie Saas, MD;  Location: [place];  Service: Orthopedics;  Laterality: Right;    Current Outpatient Prescriptions  Medication Sig Dispense Refill  . aspirin 81 MG chewable tablet Chew 81 mg by mouth daily.    Marland Kitchen atorvastatin (LIPITOR) 10 MG tablet Take 10 mg by mouth daily at 6 PM.    . calcium carbonate (OS-CAL) 600 MG TABS tablet Take 1,200 mg by mouth daily with breakfast.     . celecoxib (CELEBREX) 200 MG capsule Take 200 mg by mouth at bedtime.     . Cholecalciferol (VITAMIN D-3 PO) Take 1,000 Units by mouth daily.    . ClonazePAM (KLONOPIN PO) Take 1 tablet by mouth daily.    . DULoxetine (CYMBALTA) 60 MG capsule Take 60 mg by mouth at bedtime.     . hydrochlorothiazide (HYDRODIURIL) 25 MG tablet Take 25 mg by  mouth Daily.     Marland Kitchen KLOR-CON M20 20 MEQ tablet Take 20 mEq by mouth daily.     Marland Kitchen lamoTRIgine (LAMICTAL) 100 MG tablet Take 100 mg by mouth at bedtime.    . mirtazapine (REMERON) 15 MG tablet Take 7.5 mg by mouth at bedtime.    . montelukast (SINGULAIR) 10 MG tablet Take 10 mg by mouth at bedtime as needed (allergies).     . OLANZapine (ZYPREXA) 5 MG tablet Take 5 mg by mouth at bedtime.    . Omega-3 Fatty Acids (FISH OIL) 1200 MG CAPS Take 1,200 mg by mouth daily.     . polyethylene glycol (MIRALAX / GLYCOLAX) packet Take 17 g by mouth daily as needed for moderate constipation.     . sennosides-docusate sodium (SENOKOT-S) 8.6-50 MG tablet Take 1 tablet by mouth daily as needed for constipation.     . vitamin C (ASCORBIC ACID) 500 MG tablet Take 500 mg by mouth 2 (two) times daily.     No current facility-administered medications for this visit.     Allergies:    Allergies  Allergen Reactions  . Latex Hives and Rash    Social History:  The patient  reports that she quit smoking about 23 years ago. She has a 5.75 pack-year smoking history. She has never used smokeless tobacco. She reports that she drinks about 6.0 oz of alcohol per week . She reports that she does not use drugs.   ROS:  Please see the history of present illness.  Excessive sweating. No bleeding, syncope, orthopnea,PND  PHYSICAL EXAM: VS:  BP 116/64   Pulse 90   Ht 5' (1.524 m)   Wt 156 lb 6.4 oz (70.9 kg)   LMP  (LMP Unknown)   SpO2 98%   BMI 30.54 kg/m  Well nourished, well developed, in no acute distress  HEENT: normal  Neck: no JVD  Cardiac:  normal S1, S2; RRR; no murmur  Lungs:  clear to auscultation bilaterally, no wheezing, rhonchi or rales  Abd: soft, nontender, no hepatomegaly  Ext: no edema  Skin: warm and dry  Neuro: no focal abnormalities noted  EKG: 10/23/16-sinus rhythm, no other abnormalities-personally viewed 11/15/14- Normal rhythm, 81, short PR interval, no evidence of delta wave. Nonspecific ST  changes.     Exercise treadmill test 12/05/14-no electrocardiographic evidence of ischemia she exercised for 6 minutes   ASSESSMENT AND PLAN:  Preoperative risk assessment, shoulder replacement surgery-Dr. Chandler-exercise treadmill test reassuring in 2016. EKG reassuring recently, troponin normal. She may proceed with low overall cardiac risk for shoulder replacement surgery. She previously had her knee replaced last year without difficulty.  Atypical chest pain-normal troponin, normal EKG. No further workup.  Family history of coronary artery disease-previous  treadmill test reassuring, low risk in 2016. Continue with primary prevention.  Hypertension-currently well controlled, no changes made  Hyperlipidemia - Prior LDL was 173 with triglycerides greater than 200 without medication.   Taking atorvastatin 10 mg once a day. Continue to encourage exercise  Bipolar disorder/anxiety - per psychiatry. She is also using cannabis, alcohol.  We will see back as needed  Signed, Candee Furbish, MD [facility]  11/17/2016 1:51 PM

## 2016-11-17 NOTE — Patient Instructions (Signed)
Medication Instructions:  The current medical regimen is effective;  continue present plan and medications.  Follow-Up: Follow up as needed with Dr Skains.  Thank you for choosing [facility]!!     

## 2016-11-19 ENCOUNTER — Other Ambulatory Visit: Payer: Self-pay | Admitting: Orthopedic Surgery

## 2016-11-19 DIAGNOSIS — J189 Pneumonia, unspecified organism: Secondary | ICD-10-CM

## 2016-11-19 HISTORY — DX: Pneumonia, unspecified organism: J18.9

## 2016-11-20 DIAGNOSIS — J101 Influenza due to other identified influenza virus with other respiratory manifestations: Secondary | ICD-10-CM | POA: Diagnosis not present

## 2016-11-20 DIAGNOSIS — R6889 Other general symptoms and signs: Secondary | ICD-10-CM | POA: Diagnosis not present

## 2016-11-23 DIAGNOSIS — G894 Chronic pain syndrome: Secondary | ICD-10-CM | POA: Diagnosis not present

## 2016-11-23 DIAGNOSIS — K219 Gastro-esophageal reflux disease without esophagitis: Secondary | ICD-10-CM | POA: Diagnosis not present

## 2016-11-23 DIAGNOSIS — Z0000 Encounter for general adult medical examination without abnormal findings: Secondary | ICD-10-CM | POA: Diagnosis not present

## 2016-11-23 DIAGNOSIS — E782 Mixed hyperlipidemia: Secondary | ICD-10-CM | POA: Diagnosis not present

## 2016-11-24 ENCOUNTER — Other Ambulatory Visit: Payer: Self-pay | Admitting: Orthopedic Surgery

## 2016-11-24 DIAGNOSIS — M19012 Primary osteoarthritis, left shoulder: Secondary | ICD-10-CM

## 2016-11-25 ENCOUNTER — Inpatient Hospital Stay (HOSPITAL_COMMUNITY)
Admission: RE | Admit: 2016-11-25 | Discharge: 2016-11-25 | Disposition: A | Discharge status: Home / Self Care | Payer: Self-pay | Source: Ambulatory Visit

## 2016-11-25 NOTE — Pre-Procedure Instructions (Signed)
Elizabeth Stone  11/25/2016      [place], [address] [address] Phone: [phone] Fax: [phone]    Your procedure is scheduled on Thursday, December 03, 2016  Report to [facility] Admitting at 8:00 A.M.  Call this number if you have problems the morning of surgery:  [phone]   Remember:  Do not eat food or drink liquids after midnight Wednesday, December 02, 2016  Take these medicines the morning of surgery with A SIP OF WATER :DULoxetine (CYMBALTA), if needed: clonazePAM (KLONOPIN) for anxiety , Tylenol for pain, Afrin nasal spray for congestion  Stop taking Aspirin, vitamins, Omega-3 Fatty Acids( fish oil)  and herbal medications. Do not take any NSAIDs ie: Ibuprofen, Advil, Naproxen, BC and Goody Powder or any medication containing Aspirin such as celecoxib (CELEBREX); stop Thursday, November 26, 2016   Do not wear jewelry, make-up or nail polish.  Do not wear lotions, powders, or perfumes, or deoderant.  Do not shave 48 hours prior to surgery.   Do not bring valuables to the hospital.  [facility] is not responsible for any belongings or valuables.  Contacts, dentures or bridgework may not be worn into surgery.  Leave your suitcase in the car.  After surgery it may be brought to your room.  For patients admitted to the hospital, discharge time will be determined by your treatment team.  Patients discharged the day of surgery will not be allowed to drive home.   Special instructions:  [place] - Preparing for Surgery  Before surgery, you can play an important role.  Because skin is not sterile, your skin needs to be as free of germs as possible.  You can reduce the number of germs on you skin by washing with CHG (chlorahexidine gluconate) soap before surgery.  CHG is an antiseptic cleaner which kills germs and bonds with the skin to continue killing germs even after  washing.  Please DO NOT use if you have an allergy to CHG or antibacterial soaps.  If your skin becomes reddened/irritated stop using the CHG and inform your nurse when you arrive at Short Stay.  Do not shave (including legs and underarms) for at least 48 hours prior to the first CHG shower.  You may shave your face.  Please follow these instructions carefully:   1.  Shower with CHG Soap the night before surgery and the morning of Surgery.  2.  If you choose to wash your hair, wash your hair first as usual with your normal shampoo.  3.  After you shampoo, rinse your hair and body thoroughly to remove the Shampoo.  4.  Use CHG as you would any other liquid soap.  You can apply chg directly  to the skin and wash gently with scrungie or a clean washcloth.  5.  Apply the CHG Soap to your body ONLY FROM THE NECK DOWN.  Do not use on open wounds or open sores.  Avoid contact with your eyes, ears, mouth and genitals (private parts).  Wash genitals (private parts) with your normal soap.  6.  Wash thoroughly, paying special attention to the area where your surgery will be performed.  7.  Thoroughly rinse your body with warm water from the neck down.  8.  DO NOT shower/wash with your normal soap after using and rinsing off the CHG Soap.  9.  Pat yourself dry with a clean  towel.            10.  Wear clean pajamas.            11.  Place clean sheets on your bed the night of your first shower and do not sleep with pets.  Day of Surgery  Do not apply any lotions/deoderants the morning of surgery.  Please wear clean clothes to the hospital/surgery center.  Please read over the following fact sheets that you were given. Pain Booklet, Coughing and Deep Breathing, MRSA Information and Surgical Site Infection Prevention

## 2016-11-25 NOTE — Progress Notes (Signed)
Pt did not show for scheduled PAT. Left a voice message on pt phone with nurses name and phone number.

## 2016-11-27 ENCOUNTER — Other Ambulatory Visit: Payer: Self-pay

## 2016-12-02 DIAGNOSIS — R05 Cough: Secondary | ICD-10-CM | POA: Diagnosis not present

## 2016-12-03 ENCOUNTER — Inpatient Hospital Stay: Admit: 2016-12-03 | Payer: Medicare Other | Admitting: Orthopedic Surgery

## 2016-12-03 SURGERY — ARTHROPLASTY, SHOULDER, TOTAL, REVERSE
Anesthesia: Choice | Laterality: Left

## 2016-12-07 DIAGNOSIS — H903 Sensorineural hearing loss, bilateral: Secondary | ICD-10-CM | POA: Diagnosis not present

## 2016-12-10 DIAGNOSIS — M1712 Unilateral primary osteoarthritis, left knee: Secondary | ICD-10-CM | POA: Diagnosis not present

## 2016-12-10 DIAGNOSIS — Z96651 Presence of right artificial knee joint: Secondary | ICD-10-CM | POA: Diagnosis not present

## 2016-12-17 ENCOUNTER — Other Ambulatory Visit: Payer: Self-pay | Admitting: Orthopedic Surgery

## 2016-12-18 DIAGNOSIS — H40013 Open angle with borderline findings, low risk, bilateral: Secondary | ICD-10-CM | POA: Diagnosis not present

## 2016-12-18 DIAGNOSIS — H5319 Other subjective visual disturbances: Secondary | ICD-10-CM | POA: Diagnosis not present

## 2016-12-18 DIAGNOSIS — H43812 Vitreous degeneration, left eye: Secondary | ICD-10-CM | POA: Diagnosis not present

## 2016-12-18 DIAGNOSIS — H43392 Other vitreous opacities, left eye: Secondary | ICD-10-CM | POA: Diagnosis not present

## 2016-12-23 DIAGNOSIS — R05 Cough: Secondary | ICD-10-CM | POA: Diagnosis not present

## 2016-12-23 DIAGNOSIS — J069 Acute upper respiratory infection, unspecified: Secondary | ICD-10-CM | POA: Diagnosis not present

## 2016-12-30 ENCOUNTER — Encounter (HOSPITAL_COMMUNITY): Payer: Self-pay

## 2016-12-30 ENCOUNTER — Ambulatory Visit (HOSPITAL_COMMUNITY)
Admission: RE | Admit: 2016-12-30 | Discharge: 2016-12-30 | Disposition: A | Discharge status: Home / Self Care | Payer: Medicare Other | Source: Ambulatory Visit | Attending: Orthopedic Surgery | Admitting: Orthopedic Surgery

## 2016-12-30 ENCOUNTER — Encounter (HOSPITAL_COMMUNITY)
Admission: RE | Admit: 2016-12-30 | Discharge: 2016-12-30 | Disposition: A | Discharge status: Home / Self Care | Payer: Medicare Other | Source: Ambulatory Visit | Attending: Orthopedic Surgery | Admitting: Orthopedic Surgery

## 2016-12-30 DIAGNOSIS — Z01818 Encounter for other preprocedural examination: Secondary | ICD-10-CM | POA: Insufficient documentation

## 2016-12-30 DIAGNOSIS — Z01812 Encounter for preprocedural laboratory examination: Secondary | ICD-10-CM | POA: Insufficient documentation

## 2016-12-30 DIAGNOSIS — M1712 Unilateral primary osteoarthritis, left knee: Secondary | ICD-10-CM | POA: Diagnosis not present

## 2016-12-30 HISTORY — DX: Dyspnea, unspecified: R06.00

## 2016-12-30 HISTORY — DX: Pneumonia, unspecified organism: J18.9

## 2016-12-30 LAB — CBC WITH DIFFERENTIAL/PLATELET
BASOS PCT: 0 %
Basophils Absolute: 0 10*3/uL (ref 0.0–0.1)
EOS ABS: 0.1 10*3/uL (ref 0.0–0.7)
Eosinophils Relative: 1 %
HCT: 46.8 % — ABNORMAL HIGH (ref 36.0–46.0)
HEMOGLOBIN: 15.5 g/dL — AB (ref 12.0–15.0)
Lymphocytes Relative: 18 %
Lymphs Abs: 1.8 10*3/uL (ref 0.7–4.0)
MCH: 30.8 pg (ref 26.0–34.0)
MCHC: 33.1 g/dL (ref 30.0–36.0)
MCV: 92.9 fL (ref 78.0–100.0)
MONO ABS: 0.5 10*3/uL (ref 0.1–1.0)
MONOS PCT: 5 %
NEUTROS ABS: 7.4 10*3/uL (ref 1.7–7.7)
Neutrophils Relative %: 76 %
Platelets: 222 10*3/uL (ref 150–400)
RBC: 5.04 MIL/uL (ref 3.87–5.11)
RDW: 13.6 % (ref 11.5–15.5)
WBC: 9.7 10*3/uL (ref 4.0–10.5)

## 2016-12-30 LAB — URINALYSIS, ROUTINE W REFLEX MICROSCOPIC
Bilirubin Urine: NEGATIVE
Glucose, UA: >=500 mg/dL — AB
Hgb urine dipstick: NEGATIVE
Ketones, ur: NEGATIVE mg/dL
Leukocytes, UA: NEGATIVE
NITRITE: NEGATIVE
Protein, ur: NEGATIVE mg/dL
SPECIFIC GRAVITY, URINE: 1.015 (ref 1.005–1.030)
pH: 6 (ref 5.0–8.0)

## 2016-12-30 LAB — URINALYSIS, MICROSCOPIC (REFLEX)

## 2016-12-30 LAB — COMPREHENSIVE METABOLIC PANEL
ALBUMIN: 4.3 g/dL (ref 3.5–5.0)
ALT: 35 U/L (ref 14–54)
ANION GAP: 11 (ref 5–15)
AST: 41 U/L (ref 15–41)
Alkaline Phosphatase: 110 U/L (ref 38–126)
BUN: 11 mg/dL (ref 6–20)
CHLORIDE: 104 mmol/L (ref 101–111)
CO2: 21 mmol/L — AB (ref 22–32)
Calcium: 10.4 mg/dL — ABNORMAL HIGH (ref 8.9–10.3)
Creatinine, Ser: 0.71 mg/dL (ref 0.44–1.00)
GFR calc Af Amer: >60 mL/min (ref >60)
GFR calc non Af Amer: >60 mL/min (ref >60)
GLUCOSE: 304 mg/dL — AB (ref 65–99)
Potassium: 4 mmol/L (ref 3.5–5.1)
SODIUM: 136 mmol/L (ref 135–145)
Total Bilirubin: 0.4 mg/dL (ref 0.3–1.2)
Total Protein: 7.5 g/dL (ref 6.5–8.1)

## 2016-12-30 LAB — PROTIME-INR
INR: 1.06
Prothrombin Time: 13.8 seconds (ref 11.4–15.2)

## 2016-12-30 LAB — APTT: aPTT: 33 seconds (ref 24–36)

## 2016-12-30 LAB — SURGICAL PCR SCREEN
MRSA, PCR: NEGATIVE
STAPHYLOCOCCUS AUREUS: NEGATIVE

## 2016-12-30 NOTE — Pre-Procedure Instructions (Addendum)
Elizabeth Stone  12/30/2016      [address] [place] [address] Phone: [phone] Fax: [phone]    Your procedure is scheduled on 01/07/17  Report to [facility] Admitting at 830 A.M.  Call this number if you have problems the morning of surgery:  [phone]   Remember:  Do not eat food or drink liquids after midnight.  Take these medicines the morning of surgery with A SIP OF WATER      Duloxetine(cymbalta),clonazepam(klonipin) If needed, pain med if needed  STOP all herbel meds, nsaids (aleve,naproxen,advil,ibuprofen)  Starting 12/31/16 including all vitamins/supplements,aspirin, celebrex   Do not wear jewelry, make-up or nail polish.  Do not wear lotions, powders, or perfumes, or deoderant.  Do not shave 48 hours prior to surgery.  Men may shave face and neck.  Do not bring valuables to the hospital.  [facility] is not responsible for any belongings or valuables.  Contacts, dentures or bridgework may not be worn into surgery.  Leave your suitcase in the car.  After surgery it may be brought to your room.  For patients admitted to the hospital, discharge time will be determined by your treatment team.  Patients discharged the day of surgery will not be allowed to drive home.   Special instructions:   Special Instructions: [place] - Preparing for Surgery  Before surgery, you can play an important role.  Because skin is not sterile, your skin needs to be as free of germs as possible.  You can reduce the number of germs on you skin by washing with CHG (chlorahexidine gluconate) soap before surgery.  CHG is an antiseptic cleaner which kills germs and bonds with the skin to continue killing germs even after washing.  Please DO NOT use if you have an allergy to CHG or antibacterial soaps.  If your skin becomes reddened/irritated stop using the CHG and inform your nurse when you  arrive at Short Stay.  Do not shave (including legs and underarms) for at least 48 hours prior to the first CHG shower.  You may shave your face.  Please follow these instructions carefully:   1.  Shower with CHG Soap the night before surgery and the morning of Surgery.  2.  If you choose to wash your hair, wash your hair first as usual with your normal shampoo.  3.  After you shampoo, rinse your hair and body thoroughly to remove the Shampoo.  4.  Use CHG as you would any other liquid soap.  You can apply chg directly  to the skin and wash gently with scrungie or a clean washcloth.  5.  Apply the CHG Soap to your body ONLY FROM THE NECK DOWN.  Do not use on open wounds or open sores.  Avoid contact with your eyes ears, mouth and genitals (private parts).  Wash genitals (private parts)       with your normal soap.  6.  Wash thoroughly, paying special attention to the area where your surgery will be performed.  7.  Thoroughly rinse your body with warm water from the neck down.  8.  DO NOT shower/wash with your normal soap after using and rinsing off the CHG Soap.  9.  Pat yourself dry with a clean towel.            10.  Wear clean pajamas.  11.  Place clean sheets on your bed the night of your first shower and do not sleep with pets.  Day of Surgery  Do not apply any lotions/deodorants the morning of surgery.  Please wear clean clothes to the hospital/surgery center.  Please read over the  fact sheets that you were given.

## 2017-01-01 NOTE — Progress Notes (Addendum)
Anesthesia Chart Review: Patient is a 71 year old female scheduled for left reverse total shoulder arthroplasty on 01/07/17 by Dr. Tamera Punt.  History includes former smoker, HLD, HTN, fibromyalgia, GERD, anxiety, depression, Bipolar disorder, unsteady gait, dyspnea, arthritis, hearing impaired with history of Cochlear implant, Meniere disease, post-operative N/V, right TKA 12/16/15, right reverse total shoulder replacement 12/13/14. ED evaluation 10/22/16 for atypical chest pain with altered mental status in the setting of opiates and marijuana. She was medically cleared for in-patient pyschiatric admission 10/25/16.   - PCP is listed as Dr. Orpah Melter with [facility].  - Cardiologist is Dr. Candee Furbish, last seen on 11/17/16 for preoperative risk assessment and family history of CAD. She has a history of atypical chest pain (seen in the ED 10/22/16). He cleared her with "low overall cardiac risk for shoulder replacement surgery." PRN cardiology follow-up recommended.  Meds include ASA 81mg , Lipitor, Celebrex, Klonopin, Benadryl, doxepin, Cymbalta, Flonase, HCTZ, Norco, KCL, Lamictal, Singulair, fish oil, melatonin, Saphris.   EKG 10/23/16: NSR. Artifact worse in inferior leads. There is also baseline wanderer in aVR, aVF, V4-6.   ETT 12/05/14:  - Interpretation: normal - no evidence of ischemia by ST analysis.  - Comments: Hypertensive response to exercise. Fair exercise tolerance  CXR 12/30/16: IMPRESSION: No edema or consolidation. Stable cardiac silhouette. Advanced arthropathy left shoulder. Status post right shoulder replacement.  Preoperative labs noted. Glucose was 304--however, there is no reported history of diabetes. I left a voice message with Angela Nevin at Dr. Bettina Gavia office regarding newly elevated glucose. (No A1c was done at PAT because she did not report a history of DM. A1c was 5.0 on 12/26/15, glucose > 300 is concerning for newly diagnosed DM)  I also left a voice message for  patient to call me so I can review results. She will need this re-evaluated prior to surgery, which could mean a need to postpone surgery. I called Dr. Olen Pel' office to report glucose results and will fax over labs.   Chart will be left for follow-up regarding input from surgeon and PCP.   George Hugh [facility] Short Stay Center/Anesthesiology Phone [phone] 01/01/2017 3:05 PM  Addendum: Patient called me this morning. I reviewed her elevated glucose results. She was out of town, so she did not receive voice messages from me or Dr. Bettina Gavia office until last night. She denied known DM history, but reported she had a glucose of 165 at her last PCP visit. She has been craving sweets a lot lately. She will contact Dr. Bettina Gavia and Dr. Olen Pel' offices today regarding additional lab testing and recommendations. Will plan to update note once additional information received.  George Hugh [facility] Short Stay Center/Anesthesiology Phone [phone] 01/04/2017 10:43 AM  Addendum: Patient had A1c checked on 01/04/17, and it came back at 5.9%, consistent with average glucose of 123. They did not do a random or fasting glucose. I spoke with Angela Nevin. If Dr. Tamera Punt plans to proceed then patient will get a fasting CBG on arrival and if > 200 then procedure would still likely get cancelled until she was re-evaluated by Dr. Olen Pel. I also spoke with Dr. Olen Pel' nurse Angie. I will fax Dr. Olen Pel patient's A1c result. I discussed with Angie that surgery would cancelled if patient fasting glucose was > 200 tomorrow, but regardless patient will need close follow-up for pre-diabetes management. If Dr. Olen Pel had other or additional recommendations, then I asked that she or he call me back today before 5 PM. Patient to  get a fasting CBG on arrival.   George Hugh [facility] Short Stay Center/Anesthesiology Phone [phone] 01/06/2017 2:27 PM

## 2017-01-04 DIAGNOSIS — M25512 Pain in left shoulder: Secondary | ICD-10-CM | POA: Diagnosis not present

## 2017-01-04 DIAGNOSIS — Z01812 Encounter for preprocedural laboratory examination: Secondary | ICD-10-CM | POA: Diagnosis not present

## 2017-01-04 DIAGNOSIS — M19012 Primary osteoarthritis, left shoulder: Secondary | ICD-10-CM | POA: Diagnosis not present

## 2017-01-07 ENCOUNTER — Inpatient Hospital Stay (HOSPITAL_COMMUNITY)
Admission: RE | Admit: 2017-01-07 | Discharge: 2017-01-11 | DRG: 483 | Disposition: A | Discharge status: Skilled Nursing Facility | Payer: Medicare Other | Source: Ambulatory Visit | Attending: Orthopedic Surgery | Admitting: Orthopedic Surgery

## 2017-01-07 ENCOUNTER — Encounter (HOSPITAL_COMMUNITY)
Admission: RE | Disposition: A | Discharge status: Skilled Nursing Facility | Payer: Self-pay | Source: Ambulatory Visit | Attending: Orthopedic Surgery

## 2017-01-07 ENCOUNTER — Inpatient Hospital Stay (HOSPITAL_COMMUNITY): Payer: Medicare Other | Admitting: Vascular Surgery

## 2017-01-07 ENCOUNTER — Inpatient Hospital Stay (HOSPITAL_COMMUNITY): Payer: Medicare Other

## 2017-01-07 ENCOUNTER — Encounter (HOSPITAL_COMMUNITY): Payer: Self-pay | Admitting: Critical Care Medicine

## 2017-01-07 DIAGNOSIS — E782 Mixed hyperlipidemia: Secondary | ICD-10-CM | POA: Diagnosis present

## 2017-01-07 DIAGNOSIS — H9190 Unspecified hearing loss, unspecified ear: Secondary | ICD-10-CM | POA: Diagnosis not present

## 2017-01-07 DIAGNOSIS — G8918 Other acute postprocedural pain: Secondary | ICD-10-CM | POA: Diagnosis not present

## 2017-01-07 DIAGNOSIS — M810 Age-related osteoporosis without current pathological fracture: Secondary | ICD-10-CM | POA: Diagnosis present

## 2017-01-07 DIAGNOSIS — K219 Gastro-esophageal reflux disease without esophagitis: Secondary | ICD-10-CM | POA: Diagnosis present

## 2017-01-07 DIAGNOSIS — Z87891 Personal history of nicotine dependence: Secondary | ICD-10-CM

## 2017-01-07 DIAGNOSIS — M419 Scoliosis, unspecified: Secondary | ICD-10-CM | POA: Diagnosis present

## 2017-01-07 DIAGNOSIS — Z96612 Presence of left artificial shoulder joint: Secondary | ICD-10-CM | POA: Diagnosis not present

## 2017-01-07 DIAGNOSIS — I10 Essential (primary) hypertension: Secondary | ICD-10-CM | POA: Diagnosis present

## 2017-01-07 DIAGNOSIS — M75122 Complete rotator cuff tear or rupture of left shoulder, not specified as traumatic: Secondary | ICD-10-CM | POA: Diagnosis not present

## 2017-01-07 DIAGNOSIS — F419 Anxiety disorder, unspecified: Secondary | ICD-10-CM | POA: Diagnosis not present

## 2017-01-07 DIAGNOSIS — R739 Hyperglycemia, unspecified: Secondary | ICD-10-CM | POA: Diagnosis present

## 2017-01-07 DIAGNOSIS — Z96611 Presence of right artificial shoulder joint: Secondary | ICD-10-CM | POA: Diagnosis not present

## 2017-01-07 DIAGNOSIS — M797 Fibromyalgia: Secondary | ICD-10-CM | POA: Diagnosis not present

## 2017-01-07 DIAGNOSIS — F319 Bipolar disorder, unspecified: Secondary | ICD-10-CM | POA: Diagnosis not present

## 2017-01-07 DIAGNOSIS — Z7951 Long term (current) use of inhaled steroids: Secondary | ICD-10-CM | POA: Diagnosis not present

## 2017-01-07 DIAGNOSIS — M19012 Primary osteoarthritis, left shoulder: Secondary | ICD-10-CM | POA: Diagnosis not present

## 2017-01-07 DIAGNOSIS — M75102 Unspecified rotator cuff tear or rupture of left shoulder, not specified as traumatic: Secondary | ICD-10-CM | POA: Diagnosis present

## 2017-01-07 DIAGNOSIS — M25512 Pain in left shoulder: Secondary | ICD-10-CM | POA: Diagnosis present

## 2017-01-07 DIAGNOSIS — Z7982 Long term (current) use of aspirin: Secondary | ICD-10-CM | POA: Diagnosis not present

## 2017-01-07 DIAGNOSIS — Z96651 Presence of right artificial knee joint: Secondary | ICD-10-CM | POA: Diagnosis present

## 2017-01-07 DIAGNOSIS — Z471 Aftercare following joint replacement surgery: Secondary | ICD-10-CM | POA: Diagnosis not present

## 2017-01-07 HISTORY — PX: REVERSE SHOULDER ARTHROPLASTY: SHX5054

## 2017-01-07 LAB — GLUCOSE, CAPILLARY
GLUCOSE-CAPILLARY: 141 mg/dL — AB (ref 65–99)
GLUCOSE-CAPILLARY: 123 mg/dL — AB (ref 65–99)

## 2017-01-07 SURGERY — ARTHROPLASTY, SHOULDER, TOTAL, REVERSE
Anesthesia: General | Site: Shoulder | Laterality: Left

## 2017-01-07 MED ORDER — MONTELUKAST SODIUM 10 MG PO TABS
10.0000 mg | ORAL_TABLET | Freq: Every day | ORAL | Status: DC
  Administered 2017-01-07 – 2017-01-10 (×4): 10 mg via ORAL
  Filled 2017-01-07 (×4): qty 1

## 2017-01-07 MED ORDER — ALUM & MAG HYDROXIDE-SIMETH 200-200-20 MG/5ML PO SUSP: 30.0000 mL | ORAL | Status: DC | PRN

## 2017-01-07 MED ORDER — PHENOL 1.4 % MT LIQD: 1.0000 | OROMUCOSAL | Status: DC | PRN

## 2017-01-07 MED ORDER — ROCURONIUM BROMIDE 50 MG/5ML IV SOSY
PREFILLED_SYRINGE | INTRAVENOUS | Status: AC
  Filled 2017-01-07: qty 5

## 2017-01-07 MED ORDER — EPHEDRINE 5 MG/ML INJ
INTRAVENOUS | Status: AC
  Filled 2017-01-07: qty 10

## 2017-01-07 MED ORDER — ONDANSETRON HCL 4 MG/2ML IJ SOLN
INTRAMUSCULAR | Status: DC | PRN
  Administered 2017-01-07: 4 mg via INTRAVENOUS

## 2017-01-07 MED ORDER — POVIDONE-IODINE 7.5 % EX SOLN
Freq: Once | CUTANEOUS | Status: DC
  Filled 2017-01-07: qty 118

## 2017-01-07 MED ORDER — SUGAMMADEX SODIUM 200 MG/2ML IV SOLN
INTRAVENOUS | Status: DC | PRN
  Administered 2017-01-07: 140 mg via INTRAVENOUS

## 2017-01-07 MED ORDER — LIDOCAINE 2% (20 MG/ML) 5 ML SYRINGE
INTRAMUSCULAR | Status: AC
  Filled 2017-01-07: qty 10

## 2017-01-07 MED ORDER — METOCLOPRAMIDE HCL 5 MG/ML IJ SOLN: 10.0000 mg | Freq: Once | INTRAMUSCULAR | Status: DC | PRN

## 2017-01-07 MED ORDER — BUPIVACAINE LIPOSOME 1.3 % IJ SUSP
20.0000 mL | INTRAMUSCULAR | Status: DC
  Filled 2017-01-07: qty 20

## 2017-01-07 MED ORDER — DOCUSATE SODIUM 100 MG PO CAPS
100.0000 mg | ORAL_CAPSULE | Freq: Two times a day (BID) | ORAL | Status: DC
  Administered 2017-01-07 – 2017-01-11 (×8): 100 mg via ORAL
  Filled 2017-01-07 (×8): qty 1

## 2017-01-07 MED ORDER — METOCLOPRAMIDE HCL 5 MG PO TABS: 5.0000 mg | ORAL_TABLET | Freq: Three times a day (TID) | ORAL | Status: DC | PRN

## 2017-01-07 MED ORDER — CEFAZOLIN IN D5W 1 GM/50ML IV SOLN
1.0000 g | Freq: Four times a day (QID) | INTRAVENOUS | Status: AC
  Administered 2017-01-07 – 2017-01-08 (×3): 1 g via INTRAVENOUS
  Filled 2017-01-07 (×3): qty 50

## 2017-01-07 MED ORDER — SODIUM CHLORIDE 0.9 % IR SOLN
Status: DC | PRN
  Administered 2017-01-07: 3000 mL

## 2017-01-07 MED ORDER — PHENYLEPHRINE HCL 10 MG/ML IJ SOLN
INTRAMUSCULAR | Status: DC | PRN
  Administered 2017-01-07: 25 ug/min via INTRAVENOUS

## 2017-01-07 MED ORDER — HYDROCHLOROTHIAZIDE 25 MG PO TABS
25.0000 mg | ORAL_TABLET | Freq: Every day | ORAL | Status: DC
  Administered 2017-01-07 – 2017-01-11 (×5): 25 mg via ORAL
  Filled 2017-01-07 (×5): qty 1

## 2017-01-07 MED ORDER — FENTANYL CITRATE (PF) 100 MCG/2ML IJ SOLN
INTRAMUSCULAR | Status: AC
  Administered 2017-01-07: 50 ug via INTRAVENOUS
  Filled 2017-01-07: qty 2

## 2017-01-07 MED ORDER — 0.9 % SODIUM CHLORIDE (POUR BTL) OPTIME
TOPICAL | Status: DC | PRN
  Administered 2017-01-07: 1000 mL

## 2017-01-07 MED ORDER — FLEET ENEMA 7-19 GM/118ML RE ENEM: 1.0000 | ENEMA | Freq: Once | RECTAL | Status: DC | PRN

## 2017-01-07 MED ORDER — POTASSIUM CHLORIDE CRYS ER 20 MEQ PO TBCR
20.0000 meq | EXTENDED_RELEASE_TABLET | Freq: Every day | ORAL | Status: DC
  Administered 2017-01-07 – 2017-01-11 (×5): 20 meq via ORAL
  Filled 2017-01-07 (×5): qty 1

## 2017-01-07 MED ORDER — MENTHOL 3 MG MT LOZG: 1.0000 | LOZENGE | OROMUCOSAL | Status: DC | PRN

## 2017-01-07 MED ORDER — DEXAMETHASONE SODIUM PHOSPHATE 10 MG/ML IJ SOLN
INTRAMUSCULAR | Status: AC
  Filled 2017-01-07: qty 1

## 2017-01-07 MED ORDER — ASPIRIN EC 325 MG PO TBEC
325.0000 mg | DELAYED_RELEASE_TABLET | Freq: Every day | ORAL | Status: DC
  Administered 2017-01-07 – 2017-01-11 (×5): 325 mg via ORAL
  Filled 2017-01-07 (×6): qty 1

## 2017-01-07 MED ORDER — ACETAMINOPHEN 325 MG PO TABS: 650.0000 mg | ORAL_TABLET | Freq: Four times a day (QID) | ORAL | Status: DC | PRN

## 2017-01-07 MED ORDER — ONDANSETRON HCL 4 MG/2ML IJ SOLN: 4.0000 mg | Freq: Four times a day (QID) | INTRAMUSCULAR | Status: DC | PRN

## 2017-01-07 MED ORDER — MIDAZOLAM HCL 2 MG/2ML IJ SOLN
1.0000 mg | Freq: Once | INTRAMUSCULAR | Status: AC
  Administered 2017-01-07: 1 mg via INTRAVENOUS

## 2017-01-07 MED ORDER — FENTANYL CITRATE (PF) 100 MCG/2ML IJ SOLN
INTRAMUSCULAR | Status: AC
  Filled 2017-01-07: qty 2

## 2017-01-07 MED ORDER — ACETAMINOPHEN 650 MG RE SUPP: 650.0000 mg | Freq: Four times a day (QID) | RECTAL | Status: DC | PRN

## 2017-01-07 MED ORDER — OXYCODONE HCL 5 MG PO TABS
5.0000 mg | ORAL_TABLET | ORAL | Status: DC | PRN
  Administered 2017-01-07 – 2017-01-11 (×18): 10 mg via ORAL
  Filled 2017-01-07 (×18): qty 2

## 2017-01-07 MED ORDER — MIDAZOLAM HCL 2 MG/2ML IJ SOLN
INTRAMUSCULAR | Status: AC
  Filled 2017-01-07: qty 2

## 2017-01-07 MED ORDER — PROPOFOL 10 MG/ML IV BOLUS
INTRAVENOUS | Status: AC
  Filled 2017-01-07: qty 20

## 2017-01-07 MED ORDER — BISACODYL 5 MG PO TBEC: 5.0000 mg | DELAYED_RELEASE_TABLET | Freq: Every day | ORAL | Status: DC | PRN

## 2017-01-07 MED ORDER — MIDAZOLAM HCL 2 MG/2ML IJ SOLN
INTRAMUSCULAR | Status: AC
  Administered 2017-01-07: 1 mg via INTRAVENOUS
  Filled 2017-01-07: qty 2

## 2017-01-07 MED ORDER — FENTANYL CITRATE (PF) 100 MCG/2ML IJ SOLN
INTRAMUSCULAR | Status: DC | PRN
  Administered 2017-01-07 (×4): 50 ug via INTRAVENOUS

## 2017-01-07 MED ORDER — DEXAMETHASONE SODIUM PHOSPHATE 10 MG/ML IJ SOLN
INTRAMUSCULAR | Status: DC | PRN
  Administered 2017-01-07: 4 mg via INTRAVENOUS

## 2017-01-07 MED ORDER — LIDOCAINE 2% (20 MG/ML) 5 ML SYRINGE
INTRAMUSCULAR | Status: DC | PRN
  Administered 2017-01-07: 100 mg via INTRAVENOUS

## 2017-01-07 MED ORDER — PHENYLEPHRINE 40 MCG/ML (10ML) SYRINGE FOR IV PUSH (FOR BLOOD PRESSURE SUPPORT)
PREFILLED_SYRINGE | INTRAVENOUS | Status: AC
  Filled 2017-01-07: qty 10

## 2017-01-07 MED ORDER — POLYETHYLENE GLYCOL 3350 17 G PO PACK: 17.0000 g | PACK | Freq: Every day | ORAL | Status: DC | PRN

## 2017-01-07 MED ORDER — SODIUM CHLORIDE 0.9 % IJ SOLN
INTRAMUSCULAR | Status: DC | PRN
  Administered 2017-01-07: 40 mL

## 2017-01-07 MED ORDER — CLONAZEPAM 0.5 MG PO TABS
0.2500 mg | ORAL_TABLET | Freq: Two times a day (BID) | ORAL | Status: DC | PRN
  Administered 2017-01-08 (×2): 0.25 mg via ORAL
  Filled 2017-01-07 (×2): qty 1

## 2017-01-07 MED ORDER — ONDANSETRON HCL 4 MG PO TABS
4.0000 mg | ORAL_TABLET | Freq: Four times a day (QID) | ORAL | Status: DC | PRN
Start: 2017-01-07 — End: 2017-01-11

## 2017-01-07 MED ORDER — BUPIVACAINE-EPINEPHRINE (PF) 0.5% -1:200000 IJ SOLN
INTRAMUSCULAR | Status: DC | PRN
  Administered 2017-01-07: 30 mL via PERINEURAL

## 2017-01-07 MED ORDER — DIPHENHYDRAMINE HCL 12.5 MG/5ML PO ELIX: 12.5000 mg | ORAL_SOLUTION | ORAL | Status: DC | PRN

## 2017-01-07 MED ORDER — MORPHINE SULFATE (PF) 2 MG/ML IV SOLN
1.0000 mg | INTRAVENOUS | Status: DC | PRN
  Administered 2017-01-07 – 2017-01-08 (×3): 2 mg via INTRAVENOUS
  Filled 2017-01-07 (×3): qty 1

## 2017-01-07 MED ORDER — ONDANSETRON HCL 4 MG/2ML IJ SOLN
INTRAMUSCULAR | Status: AC
  Filled 2017-01-07: qty 2

## 2017-01-07 MED ORDER — DOXEPIN HCL 50 MG PO CAPS
50.0000 mg | ORAL_CAPSULE | Freq: Every evening | ORAL | Status: DC | PRN
  Filled 2017-01-07: qty 1

## 2017-01-07 MED ORDER — SODIUM CHLORIDE 0.9 % IV SOLN
INTRAVENOUS | Status: DC
  Administered 2017-01-07: 22:00:00 via INTRAVENOUS

## 2017-01-07 MED ORDER — BUPIVACAINE LIPOSOME 1.3 % IJ SUSP
INTRAMUSCULAR | Status: DC | PRN
  Administered 2017-01-07: 20 mL

## 2017-01-07 MED ORDER — ROCURONIUM BROMIDE 50 MG/5ML IV SOSY
PREFILLED_SYRINGE | INTRAVENOUS | Status: DC | PRN
  Administered 2017-01-07 (×2): 10 mg via INTRAVENOUS
  Administered 2017-01-07: 50 mg via INTRAVENOUS

## 2017-01-07 MED ORDER — TRANEXAMIC ACID 1000 MG/10ML IV SOLN
1000.0000 mg | INTRAVENOUS | Status: AC
  Administered 2017-01-07: 1000 mg via INTRAVENOUS
  Filled 2017-01-07: qty 10

## 2017-01-07 MED ORDER — PROPOFOL 10 MG/ML IV BOLUS
INTRAVENOUS | Status: DC | PRN
  Administered 2017-01-07: 150 mg via INTRAVENOUS

## 2017-01-07 MED ORDER — ACETAMINOPHEN 500 MG PO TABS
1000.0000 mg | ORAL_TABLET | Freq: Four times a day (QID) | ORAL | Status: AC
  Administered 2017-01-07 – 2017-01-08 (×4): 1000 mg via ORAL
  Filled 2017-01-07 (×4): qty 2

## 2017-01-07 MED ORDER — CYCLOBENZAPRINE HCL 10 MG PO TABS
5.0000 mg | ORAL_TABLET | Freq: Every day | ORAL | Status: DC
  Administered 2017-01-08 – 2017-01-10 (×4): 5 mg via ORAL
  Filled 2017-01-07 (×4): qty 1

## 2017-01-07 MED ORDER — LACTATED RINGERS IV SOLN
INTRAVENOUS | Status: DC
  Administered 2017-01-07 (×2): via INTRAVENOUS

## 2017-01-07 MED ORDER — FENTANYL CITRATE (PF) 100 MCG/2ML IJ SOLN
25.0000 ug | INTRAMUSCULAR | Status: DC | PRN
Start: 2017-01-07 — End: 2017-01-07

## 2017-01-07 MED ORDER — MEPERIDINE HCL 25 MG/ML IJ SOLN
6.2500 mg | INTRAMUSCULAR | Status: DC | PRN
Start: 2017-01-07 — End: 2017-01-07

## 2017-01-07 MED ORDER — SUCCINYLCHOLINE CHLORIDE 200 MG/10ML IV SOSY
PREFILLED_SYRINGE | INTRAVENOUS | Status: AC
  Filled 2017-01-07: qty 20

## 2017-01-07 MED ORDER — ZOLPIDEM TARTRATE 5 MG PO TABS
5.0000 mg | ORAL_TABLET | Freq: Every evening | ORAL | Status: DC | PRN
  Administered 2017-01-08: 5 mg via ORAL
  Filled 2017-01-07: qty 1

## 2017-01-07 MED ORDER — FENTANYL CITRATE (PF) 100 MCG/2ML IJ SOLN
50.0000 ug | Freq: Once | INTRAMUSCULAR | Status: AC
  Administered 2017-01-07: 50 ug via INTRAVENOUS

## 2017-01-07 MED ORDER — METOCLOPRAMIDE HCL 5 MG/ML IJ SOLN: 5.0000 mg | Freq: Three times a day (TID) | INTRAMUSCULAR | Status: DC | PRN

## 2017-01-07 MED ORDER — DULOXETINE HCL 60 MG PO CPEP
60.0000 mg | ORAL_CAPSULE | Freq: Two times a day (BID) | ORAL | Status: DC
  Administered 2017-01-07 – 2017-01-11 (×8): 60 mg via ORAL
  Filled 2017-01-07 (×8): qty 1

## 2017-01-07 MED ORDER — ATORVASTATIN CALCIUM 10 MG PO TABS
10.0000 mg | ORAL_TABLET | Freq: Every day | ORAL | Status: DC
  Administered 2017-01-07 – 2017-01-10 (×4): 10 mg via ORAL
  Filled 2017-01-07 (×4): qty 1

## 2017-01-07 MED ORDER — CEFAZOLIN SODIUM-DEXTROSE 2-4 GM/100ML-% IV SOLN
2.0000 g | INTRAVENOUS | Status: AC
  Administered 2017-01-07: 2 g via INTRAVENOUS
  Filled 2017-01-07: qty 100

## 2017-01-07 SURGICAL SUPPLY — 70 items
BASEPLATE P2 COATD GLND 6.5X30 (Shoulder) ×1 IMPLANT
BLADE SAW SAG 73X25 THK (BLADE) ×1
BLADE SAW SGTL 73X25 THK (BLADE) ×1 IMPLANT
CEMENT BONE DEPUY (Cement) ×4 IMPLANT
CHLORAPREP W/TINT 26ML (MISCELLANEOUS) ×2 IMPLANT
COVER MAYO STAND STRL (DRAPES) ×2 IMPLANT
COVER SURGICAL LIGHT HANDLE (MISCELLANEOUS) ×2 IMPLANT
DRAPE INCISE IOBAN 66X45 STRL (DRAPES) ×2 IMPLANT
DRAPE ORTHO SPLIT 77X108 STRL (DRAPES) ×2
DRAPE SURG ORHT 6 SPLT 77X108 (DRAPES) ×2 IMPLANT
DRSG AQUACEL AG ADV 3.5X10 (GAUZE/BANDAGES/DRESSINGS) ×2 IMPLANT
ELECT BLADE 4.0 EZ CLEAN MEGAD (MISCELLANEOUS) ×2
ELECT REM PT RETURN 9FT ADLT (ELECTROSURGICAL) ×2
ELECTRODE BLDE 4.0 EZ CLN MEGD (MISCELLANEOUS) ×1 IMPLANT
ELECTRODE REM PT RTRN 9FT ADLT (ELECTROSURGICAL) ×1 IMPLANT
GLOVE BIOGEL PI IND STRL 7.0 (GLOVE) ×1 IMPLANT
GLOVE BIOGEL PI IND STRL 8 (GLOVE) ×1 IMPLANT
GLOVE BIOGEL PI INDICATOR 7.0 (GLOVE) ×1
GLOVE BIOGEL PI INDICATOR 8 (GLOVE) ×1
GLOVE SURG SS PI 6.5 STRL IVOR (GLOVE) ×4 IMPLANT
GLOVE SURG SS PI 7.0 STRL IVOR (GLOVE) ×4 IMPLANT
GLOVE SURG SS PI 7.5 STRL IVOR (GLOVE) ×2 IMPLANT
GOWN STRL REUS W/ TWL LRG LVL3 (GOWN DISPOSABLE) ×1 IMPLANT
GOWN STRL REUS W/ TWL XL LVL3 (GOWN DISPOSABLE) ×3 IMPLANT
GOWN STRL REUS W/TWL LRG LVL3 (GOWN DISPOSABLE) ×1
GOWN STRL REUS W/TWL XL LVL3 (GOWN DISPOSABLE) ×3
HANDPIECE INTERPULSE COAX TIP (DISPOSABLE) ×1
HOOD PEEL AWAY FLYTE STAYCOOL (MISCELLANEOUS) ×4 IMPLANT
INSERT EPOLY STND HUM SKT 32 (Shoulder) ×2 IMPLANT
INSERT EPOLYSTD HUM SKT 32 (Shoulder) ×1 IMPLANT
KIT BASIN OR (CUSTOM PROCEDURE TRAY) ×2 IMPLANT
KIT ROOM TURNOVER OR (KITS) ×2 IMPLANT
MANIFOLD NEPTUNE II (INSTRUMENTS) ×2 IMPLANT
NEEDLE SPNL 18GX3.5 QUINCKE PK (NEEDLE) ×2 IMPLANT
NS IRRIG 1000ML POUR BTL (IV SOLUTION) ×2 IMPLANT
P2 COATDE GLNOID BSEPLT 6.5X30 (Shoulder) ×2 IMPLANT
PACK SHOULDER (CUSTOM PROCEDURE TRAY) ×2 IMPLANT
PAD ARMBOARD 7.5X6 YLW CONV (MISCELLANEOUS) ×4 IMPLANT
RESTRAINT HEAD UNIVERSAL NS (MISCELLANEOUS) ×2 IMPLANT
RETRIEVER SUT HEWSON (MISCELLANEOUS) ×2 IMPLANT
SCREW BONE LOCKING RSP 5.0X14 (Screw) ×2 IMPLANT
SCREW BONE RSP LOCK 5X14 (Screw) ×1 IMPLANT
SCREW BONE RSP LOCK 5X18 (Screw) ×1 IMPLANT
SCREW BONE RSP LOCK 5X22 (Screw) ×1 IMPLANT
SCREW BONE RSP LOCK 5X26 (Screw) ×1 IMPLANT
SCREW BONE RSP LOCKING 18MM LG (Screw) ×2 IMPLANT
SCREW BONE RSP LOCKING 5.0X26 (Screw) ×2 IMPLANT
SCREW BONE RSP LOCKING 5.0X32 (Screw) ×2 IMPLANT
SCREW RETAIN W/HEAD 4MM OFFSET (Shoulder) ×2 IMPLANT
SET HNDPC FAN SPRY TIP SCT (DISPOSABLE) ×1 IMPLANT
SLING ARM FOAM STRAP MED (SOFTGOODS) ×2 IMPLANT
SPONGE LAP 18X18 X RAY DECT (DISPOSABLE) ×2 IMPLANT
STEM REV PRIMARY 8X108 (Shoulder) ×2 IMPLANT
STRIP CLOSURE SKIN 1/2X4 (GAUZE/BANDAGES/DRESSINGS) ×2 IMPLANT
SUCTION FRAZIER HANDLE 10FR (MISCELLANEOUS) ×1
SUCTION TUBE FRAZIER 10FR DISP (MISCELLANEOUS) ×1 IMPLANT
SUPPORT WRAP ARM LG (MISCELLANEOUS) ×2 IMPLANT
SUT ETHIBOND NAB CT1 #1 30IN (SUTURE) ×2 IMPLANT
SUT FIBERWIRE #2 38 T-5 BLUE (SUTURE) ×2
SUT MNCRL AB 4-0 PS2 18 (SUTURE) ×2 IMPLANT
SUT SILK 2 0 TIES 17X18 (SUTURE)
SUT SILK 2-0 18XBRD TIE BLK (SUTURE) IMPLANT
SUT VIC AB 2-0 CT1 27 (SUTURE) ×1
SUT VIC AB 2-0 CT1 TAPERPNT 27 (SUTURE) ×1 IMPLANT
SUTURE FIBERWR #2 38 T-5 BLUE (SUTURE) ×1 IMPLANT
SYR 50ML SLIP (SYRINGE) ×2 IMPLANT
TAPE FIBER 2MM 7IN #2 BLUE (SUTURE) IMPLANT
TOWEL OR 17X24 6PK STRL BLUE (TOWEL DISPOSABLE) ×2 IMPLANT
TOWEL OR 17X26 10 PK STRL BLUE (TOWEL DISPOSABLE) ×2 IMPLANT
WATER STERILE IRR 1000ML POUR (IV SOLUTION) ×2 IMPLANT

## 2017-01-07 NOTE — Anesthesia Preprocedure Evaluation (Signed)
Anesthesia Evaluation  Patient identified by MRN, date of birth, ID band Patient awake    Reviewed: Allergy & Precautions, NPO status , Patient's Chart, lab work & pertinent test results  Airway Mallampati: II  TM Distance: >3 FB Neck ROM: Full    Dental no notable dental hx.    Pulmonary former smoker,    Pulmonary exam normal breath sounds clear to auscultation       Cardiovascular hypertension, Pt. on medications Normal cardiovascular exam Rhythm:Regular Rate:Normal     Neuro/Psych negative neurological ROS  negative psych ROS   GI/Hepatic negative GI ROS, Neg liver ROS,   Endo/Other  negative endocrine ROS  Renal/GU negative Renal ROS  negative genitourinary   Musculoskeletal  (+) Fibromyalgia -  Abdominal   Peds negative pediatric ROS (+)  Hematology negative hematology ROS (+)   Anesthesia Other Findings   Reproductive/Obstetrics negative OB ROS                            Anesthesia Physical Anesthesia Plan  ASA: II  Anesthesia Plan: General   Post-op Pain Management:  Regional for Post-op pain   Induction: Intravenous  Airway Management Planned: Oral ETT  Additional Equipment:   Intra-op Plan:   Post-operative Plan: Extubation in OR  Informed Consent: I have reviewed the patients History and Physical, chart, labs and discussed the procedure including the risks, benefits and alternatives for the proposed anesthesia with the patient or authorized representative who has indicated his/her understanding and acceptance.   Dental advisory given  Plan Discussed with: CRNA  Anesthesia Plan Comments: (SCB)        Anesthesia Quick Evaluation

## 2017-01-07 NOTE — Transfer of Care (Signed)
Immediate Anesthesia Transfer of Care Note  Patient: Elizabeth Stone  Procedure(s) Performed: Procedure(s) with comments: REVERSE LEFT TOTAL SHOULDER ARTHROPLASTY (Left) - Left reveres total shoulder arthroplasty  Patient Location: PACU  Anesthesia Type:General  Level of Consciousness: awake, alert  and oriented  Airway & Oxygen Therapy: Patient Spontanous Breathing and Patient connected to nasal cannula oxygen  Post-op Assessment: Report given to RN, Post -op Vital signs reviewed and stable and Patient moving all extremities  Post vital signs: Reviewed and stable  Last Vitals:  Vitals:   01/07/17 0906 01/07/17 1304  BP: (!) 153/64   Pulse: 86   Resp: 18   Temp: 36.8 C (P) 36.1 C    Last Pain:  Vitals:   01/07/17 0923  PainSc: 10-Worst pain ever      Patients Stated Pain Goal: 5 (61/22/44 9753)  Complications: No apparent anesthesia complications

## 2017-01-07 NOTE — Discharge Instructions (Signed)

## 2017-01-07 NOTE — Anesthesia Procedure Notes (Signed)
Anesthesia Regional Block: Supraclavicular block   Pre-Anesthetic Checklist: ,, timeout performed, Correct Patient, Correct Site, Correct Laterality, Correct Procedure, Correct Position, site marked, Risks and benefits discussed,  Surgical consent,  Pre-op evaluation,  At surgeon's request and post-op pain management  Laterality: Left and Upper  Prep: Maximum Sterile Barrier Precautions used, chloraprep       Needles:  Injection technique: Single-shot  Needle Type: Echogenic Stimulator Needle     Needle Length: 10cm      Additional Needles:   Procedures: ultrasound guided,,,,,,,,  Narrative:  Start time: 01/07/2017 9:48 AM End time: 01/07/2017 9:58 AM Injection made incrementally with aspirations every 5 mL.  Performed by: Personally  Anesthesiologist: Montez Hageman  Additional Notes: Risks, benefits and alternative to block explained extensively.  Patient tolerated procedure well, without complications.

## 2017-01-07 NOTE — H&P (Signed)
Elizabeth Stone is an 71 y.o. female.   Chief Complaint: L shoulder pain and dysfunction HPI: Endstage L shoulder arthritis with rotator cuff tear arthropathy, significant pain and dysfunction, failed conservative measures.  Pain interferes with sleep and quality of life.  Past Medical History:  Diagnosis Date  . Acute blood loss anemia    hx  . Anxiety   . Arthritis   . Bipolar disorder ([place])   . Depression   . Dyspnea   . Fibromyalgia   . GERD (gastroesophageal reflux disease)   . Hearing impaired    has cochlear implant  . HTN (hypertension)   . Hypokalemia   . Meniere disease    takes HCTZ for her inner ear problem  . Mixed hyperlipidemia 09/27/2013  . Osteoporosis   . Pneumonia 11/2016  . Primary localized osteoarthritis of right knee   . Slow transit constipation   . Unsteady gait     Past Surgical History:  Procedure Laterality Date  . ABDOMINAL HYSTERECTOMY  1986  . bladder tack  2007  . COCHLEAR IMPLANT     2007  . COLONOSCOPY    . HAMMER TOE SURGERY  2014   left foot  . REVERSE SHOULDER ARTHROPLASTY Right 12/13/2014   dr Tamera Punt  . REVERSE SHOULDER ARTHROPLASTY Right 12/13/2014   Procedure: REVERSE SHOULDER ARTHROPLASTY rt;  Surgeon: Nita Sells, MD;  Location: [place];  Service: Orthopedics;  Laterality: Right;  Right reverse total shoulder replacement  . TOTAL KNEE ARTHROPLASTY Right 12/16/2015   Procedure: TOTAL KNEE ARTHROPLASTY;  Surgeon: Elsie Saas, MD;  Location: [place];  Service: Orthopedics;  Laterality: Right;    Family History  Problem Relation Age of Onset  . Heart disease Mother   . Breast cancer Mother   . Cervical cancer Mother   . Diabetes Mother   . Heart disease Father   . Diabetes Maternal Aunt   . Colon cancer Neg Hx   . Esophageal cancer Neg Hx   . Rectal cancer Neg Hx   . Stomach cancer Neg Hx    Social History:  reports that she quit smoking about 23 years ago. She has a 5.75 pack-year smoking history. She has never  used smokeless tobacco. She reports that she drinks about 6.0 oz of alcohol per week . She reports that she does not use drugs.  Allergies:  Allergies  Allergen Reactions  . Latex Hives and Rash    Tape   "No problems with gloves"    Medications Prior to Admission  Medication Sig Dispense Refill  . acetaminophen (TYLENOL) 500 MG tablet Take 500-1,000 mg by mouth every 6 (six) hours as needed (for pain.).    Marland Kitchen aspirin EC 81 MG tablet Take 81 mg by mouth daily.    Marland Kitchen atorvastatin (LIPITOR) 10 MG tablet Take 10 mg by mouth at bedtime.     . calcium carbonate (OS-CAL) 600 MG TABS tablet Take 600 mg by mouth 2 (two) times daily.     . celecoxib (CELEBREX) 200 MG capsule Take 200 mg by mouth daily with breakfast.     . cholecalciferol (VITAMIN D) 1000 units tablet Take 1,000 Units by mouth at bedtime.     . clonazePAM (KLONOPIN) 0.25 MG disintegrating tablet Take 0.25 mg by mouth See admin instructions. Pt takes two 0.25 mg at bedtime, and may take 0.25 mg twice daily as needed    . diphenhydrAMINE (BENADRYL) 12.5 MG chewable tablet Chew 10 mg by mouth at bedtime as  needed for allergies. sublingual    . docusate sodium (COLACE) 50 MG capsule Take 50 mg by mouth daily as needed for mild constipation.    . DULoxetine (CYMBALTA) 60 MG capsule Take 60 mg by mouth 2 (two) times daily.     . hydrochlorothiazide (HYDRODIURIL) 25 MG tablet Take 25 mg by mouth Daily.     Marland Kitchen HYDROcodone-acetaminophen (NORCO) 10-325 MG tablet Take 1 tablet by mouth every 6 (six) hours as needed.    Marland Kitchen HYDROcodone-homatropine (HYCODAN) 5-1.5 MG/5ML syrup Take 5 mLs by mouth every 6 (six) hours as needed for cough.    Marland Kitchen KLOR-CON M20 20 MEQ tablet Take 20 mEq by mouth daily.     Marland Kitchen lamoTRIgine (LAMICTAL) 200 MG tablet Take 200 mg by mouth at bedtime.    . montelukast (SINGULAIR) 10 MG tablet Take 10 mg by mouth at bedtime.     . Omega-3 Fatty Acids (FISH OIL) 1200 MG CAPS Take 1,200 mg by mouth daily.     Marland Kitchen OVER THE COUNTER  MEDICATION Take 10 mg by mouth at bedtime. saphris    rx not over the counter    . OVER THE COUNTER MEDICATION melatoin 3mg  --3tabs pm    . polyethylene glycol (MIRALAX / GLYCOLAX) packet Take 17 g by mouth daily as needed for moderate constipation.     . sennosides-docusate sodium (SENOKOT-S) 8.6-50 MG tablet Take 1 tablet by mouth daily as needed for constipation.     . vitamin C (ASCORBIC ACID) 500 MG tablet Take 500 mg by mouth 2 (two) times daily.    Marland Kitchen doxepin (SINEQUAN) 50 MG capsule Take 50 mg by mouth at bedtime as needed (sleep/anxiety.).    Marland Kitchen fluticasone (FLONASE) 50 MCG/ACT nasal spray Place 1 spray into both nostrils daily as needed for allergies or rhinitis.    Marland Kitchen oxymetazoline (AFRIN) 0.05 % nasal spray Place 1-2 sprays into both nostrils 2 (two) times daily as needed for congestion.      Results for orders placed or performed during the hospital encounter of 01/07/17 (from the past 48 hour(s))  Glucose, capillary     Status: Abnormal   Collection Time: 01/07/17  9:09 AM  Result Value Ref Range   Glucose-Capillary 141 (H) 65 - 99 mg/dL   Comment 1 Notify RN    Comment 2 Document in Chart    No results found.  Review of Systems  All other systems reviewed and are negative.   Blood pressure (!) 153/64, pulse 86, temperature 98.2 F (36.8 C), resp. rate 18, weight 70.8 kg (156 lb), SpO2 95 %. Physical Exam  Constitutional: She is oriented to person, place, and time. She appears well-developed and well-nourished.  HENT:  Head: Atraumatic.  Eyes: EOM are normal.  Cardiovascular: Intact distal pulses.   Respiratory: Effort normal.  Neurological: She is alert and oriented to person, place, and time.  Skin: Skin is warm and dry.  Psychiatric: She has a normal mood and affect.     Assessment/Plan L shoulder rotator cuff tear arthropathy Risks / benefits of surgery discussed Consent on chart  NPO for OR Preop antibiotics   Nita Sells, MD 01/07/2017, 9:46  AM

## 2017-01-07 NOTE — Anesthesia Procedure Notes (Signed)
Procedure Name: Intubation Date/Time: 01/07/2017 10:08 AM Performed by: Merrilyn Puma B Pre-anesthesia Checklist: Patient identified, Suction available, Emergency Drugs available, Patient being monitored and Timeout performed Patient Re-evaluated:Patient Re-evaluated prior to inductionOxygen Delivery Method: Circle system utilized Preoxygenation: Pre-oxygenation with 100% oxygen Intubation Type: IV induction and Cricoid Pressure applied Ventilation: Mask ventilation without difficulty Laryngoscope Size: Mac and 3 Grade View: Grade III Tube type: Oral Tube size: 7.0 mm Number of attempts: 1 Airway Equipment and Method: Stylet Placement Confirmation: ETT inserted through vocal cords under direct vision,  positive ETCO2,  CO2 detector and breath sounds checked- equal and bilateral Secured at: 21 cm Tube secured with: Tape Dental Injury: Teeth and Oropharynx as per pre-operative assessment

## 2017-01-07 NOTE — Anesthesia Postprocedure Evaluation (Signed)
Anesthesia Post Note  Patient: Elizabeth Stone  Procedure(s) Performed: Procedure(s) (LRB): REVERSE LEFT TOTAL SHOULDER ARTHROPLASTY (Left)  Patient location during evaluation: PACU Anesthesia Type: General and Regional Level of consciousness: awake and alert Pain management: pain level controlled Vital Signs Assessment: post-procedure vital signs reviewed and stable Respiratory status: spontaneous breathing, nonlabored ventilation, respiratory function stable and patient connected to nasal cannula oxygen Cardiovascular status: blood pressure returned to baseline and stable Postop Assessment: no signs of nausea or vomiting Anesthetic complications: no       Last Vitals:  Vitals:   01/07/17 1330 01/07/17 1333  BP:  122/67  Pulse:  (!) 107  Resp: 18 17  Temp:      Last Pain:  Vitals:   01/07/17 1304  PainSc: Asleep                 Montez Hageman

## 2017-01-07 NOTE — Op Note (Signed)
Procedure(s): REVERSE LEFT TOTAL SHOULDER ARTHROPLASTY Procedure Note  Elizabeth Stone female 71 y.o. 01/07/2017  Procedure(s) and Anesthesia Type:    * REVERSE LEFT TOTAL SHOULDER ARTHROPLASTY - General   Indications:  71 y.o. female  With endstage left shoulder arthritis with irrepairable rotator cuff tear. Pain and dysfunction interfered with quality of life and nonoperative treatment with activity modification, NSAIDS and injections failed.     Surgeon: Nita Sells   Assistants: Jeanmarie Hubert PA-C [facility] was present and scrubbed throughout the procedure and was essential in positioning, retraction, exposure, and closure)  Anesthesia: General endotracheal anesthesia with preoperative interscalene block given by the attending anesthesiologist   Procedure Detail  REVERSE LEFT TOTAL SHOULDER ARTHROPLASTY   Estimated Blood Loss:  200 mL         Drains: none  Blood Given: none          Specimens: none        Complications:  * No complications entered in OR log *         Disposition: PACU - hemodynamically stable.         Condition: stable      OPERATIVE FINDINGS:  A DJO Altivate pressfit reverse total shoulder arthroplasty was placed with a  size 8 stem with a hybrid cement technique, a 32-4 glenosphere, and a standard poly insert. The base plate  fixation was excellent.  Shoulder is noted to be extremely tight and reduction was difficult, necessitating a lower head cut and ultimately removal of most of the metaphyseal bone for proper seating of the implant.  PROCEDURE: The patient was identified in the preoperative holding area  where I personally marked the operative site after verifying site, side,  and procedure with the patient. An interscalene block given by  the attending anesthesiologist in the holding area and the patient was taken back to the operating room where all extremities were  carefully padded in position after general anesthesia was  induced. She  was placed in a beach-chair position and the operative upper extremity was  prepped and draped in a standard sterile fashion. The patient received 1 g IV tranexamic acid at the start of the case around time of the incision.  An approximately 10-  cm incision was made from the tip of the coracoid process to the center  point of the humerus at the level of the axilla. Dissection was carried  down through subcutaneous tissues to the level of the cephalic vein  which was taken laterally with the deltoid. The pectoralis major was  retracted medially. The subdeltoid space was developed and the lateral  edge of the conjoined tendon was identified. The undersurface of  conjoined tendon was palpated and the musculocutaneous nerve was not in  the field. Retractor was placed underneath the conjoined and second  retractor was placed lateral into the deltoid. The circumflex humeral  artery and vessels were identified and clamped and coagulated. The  biceps tendon was absent.  The subscapularis was chronically torn.  The  joint was then gently externally rotated while the capsule was released  from the humeral neck around to just beyond the 6 o'clock position. At  this point, the joint was dislocated and the humeral head was presented  into the wound. The excessive osteophyte formation was removed with a  large rongeur.  The cutting guide was used to make the appropriate  head cut and the head was saved for potentially bone grafting.  The glenoid was exposed with the  arm in an  abducted extended position. The anterior and posterior labrum were  completely excised and the capsule was released circumferentially to  allow for exposure of the glenoid for preparation. The 2.5 mm drill was  placed using the guide in 5-10 inferior angulation and the tap was then advanced in the same hole. Small and large reamers were then used. The tap was then removed and the Metaglene was then screwed in with  excellent purchase.  The peripheral guide was then used to drilled measured and filled peripheral locking screws. The size  32-4  glenosphere was then impacted on the [place] taper and the central screw was placed. The humerus was then again exposed with some difficulty and the diaphyseal reamers were used followed by the metaphyseal reamers. Upon trial reduction it was noted again to be extremely tight and was unable to adequately reduce it. Therefore the trial was removed and a lower head cut was made. The trial implant was sitting somewhat medial on the reaming. During a second reduction attempt the very thin metaphyseal shell was cracked. At this point I made the decision to just remove this bone as it was not even against the component and was really not providing any stability to the implant or rotator cuff attachment. The size 8 trial was then placed and reduced more easily. At this point the movement was smooth and full. With abduction there is no instability, and less finger was used to unhinge the implant superiorly and then it would come out superiorly. Therefore the slightly deeper retention poly-was trialed and had better stability. The final implant was then placed with a hybrid cement and proximal pressfit technique. The joint was then reduced and taken through full range of motion with excellent motion excellent stability. The joint was then copiously irrigated with pulse  lavage and the wound was then closed. The subscapularis was not repaired.Throughout the case a mixture of 20 mL liposomal bupivacaine and 60 mL normal saline was used to infiltrate the deep capsular tissue, bony surfaces and subcutaneous tissue.   Skin was closed with 2-0 Vicryl in a deep dermal layer and 4-0  Monocryl for skin closure. Steri-Strips were applied. Sterile  dressings were then applied as well as a sling. The patient was allowed  to awaken from general anesthesia, transferred to stretcher, and taken  to  recovery room in stable condition.   POSTOPERATIVE PLAN: The patient will be kept in the hospital postoperatively  for pain control and therapy.

## 2017-01-08 ENCOUNTER — Encounter (HOSPITAL_COMMUNITY): Payer: Self-pay | Admitting: Orthopedic Surgery

## 2017-01-08 LAB — BASIC METABOLIC PANEL
Anion gap: 10 (ref 5–15)
BUN: 13 mg/dL (ref 6–20)
CALCIUM: 8.8 mg/dL — AB (ref 8.9–10.3)
CO2: 28 mmol/L (ref 22–32)
CREATININE: 0.72 mg/dL (ref 0.44–1.00)
Chloride: 96 mmol/L — ABNORMAL LOW (ref 101–111)
Glucose, Bld: 157 mg/dL — ABNORMAL HIGH (ref 65–99)
Potassium: 3.6 mmol/L (ref 3.5–5.1)
SODIUM: 134 mmol/L — AB (ref 135–145)

## 2017-01-08 LAB — CBC
HEMATOCRIT: 35.8 % — AB (ref 36.0–46.0)
Hemoglobin: 11.9 g/dL — ABNORMAL LOW (ref 12.0–15.0)
MCH: 31 pg (ref 26.0–34.0)
MCHC: 33.2 g/dL (ref 30.0–36.0)
MCV: 93.2 fL (ref 78.0–100.0)
PLATELETS: 258 10*3/uL (ref 150–400)
RBC: 3.84 MIL/uL — ABNORMAL LOW (ref 3.87–5.11)
RDW: 13.5 % (ref 11.5–15.5)
WBC: 14.2 10*3/uL — ABNORMAL HIGH (ref 4.0–10.5)

## 2017-01-08 MED ORDER — OXYCODONE HCL 5 MG PO TABS: 5.0000 mg | ORAL_TABLET | ORAL | 0 refills | Status: DC | PRN

## 2017-01-08 MED ORDER — DOCUSATE SODIUM 100 MG PO CAPS: 100.0000 mg | ORAL_CAPSULE | Freq: Three times a day (TID) | ORAL | 0 refills | Status: DC | PRN

## 2017-01-08 MED ORDER — DIAZEPAM 5 MG PO TABS
5.0000 mg | ORAL_TABLET | Freq: Once | ORAL | Status: AC
  Administered 2017-01-08: 5 mg via ORAL
  Filled 2017-01-08: qty 1

## 2017-01-08 MED ORDER — HYDROMORPHONE HCL 2 MG/ML IJ SOLN
0.5000 mg | INTRAMUSCULAR | Status: DC | PRN
  Administered 2017-01-08 – 2017-01-11 (×8): 1 mg via INTRAVENOUS
  Filled 2017-01-08 (×8): qty 1

## 2017-01-08 NOTE — Progress Notes (Signed)
   PATIENT ID: Elizabeth Stone   1 Day Post-Op Procedure(s) (LRB): REVERSE LEFT TOTAL SHOULDER ARTHROPLASTY (Left)  Subjective: Doing well, block is wearing off. Minimal pain. Slept well overnight.  Objective:  Vitals:   01/08/17 0001 01/08/17 0501  BP: 130/63 (!) 121/47  Pulse: 95 91  Resp: 16 16  Temp: 98.6 F (37 C) 98.2 F (36.8 C)     L UE dressing c/d/I Wiggles fingers, distally NVI  Labs:   Recent Labs  01/08/17 0252  HGB 11.9*   Recent Labs  01/08/17 0252  WBC 14.2*  RBC 3.84*  HCT 35.8*  PLT 258   Recent Labs  01/08/17 0252  NA 134*  K 3.6  CL 96*  CO2 28  BUN 13  CREATININE 0.72  GLUCOSE 157*  CALCIUM 8.8*    Assessment and Plan: 1 day s/p left reverse TSA with cement Pain well controlled, oral meds if tolerated, IV only for breakthrough OT- conservative protocol, no PROM of shoulder Planning on d/c to SNF- [facility], per patient this has been set up by family, consulted SW to help facilitate transition Hyperglycemia- improved from preoperatively but still high, is going to follow up soon with PCP D/c today if [facility] place is ready, may need to stay until available  VTE proph: ASA, SCDs

## 2017-01-09 MED ORDER — CLONAZEPAM 0.5 MG PO TABS
0.5000 mg | ORAL_TABLET | Freq: Every day | ORAL | Status: DC
  Administered 2017-01-09 – 2017-01-10 (×2): 0.5 mg via ORAL
  Filled 2017-01-09 (×2): qty 1

## 2017-01-09 NOTE — Clinical Social Work Placement (Signed)
   CLINICAL SOCIAL WORK PLACEMENT  NOTE  Date:  01/09/2017  Patient Details  Name: Elizabeth Stone MRN: [id number] Date of Birth: 1946/04/09  Clinical Social Work is seeking post-discharge placement for this patient at the [place] level of care (*CSW will initial, date and re-position this form in  chart as items are completed):  No   Patient/family provided with [place] Work Department's list of facilities offering this level of care within the geographic area requested by the patient (or if unable, by the patient's family).  Yes   Patient/family informed of their freedom to choose among providers that offer the needed level of care, that participate in Medicare, Medicaid or managed care program needed by the patient, have an available bed and are willing to accept the patient.  Yes   Patient/family informed of [place]'s ownership interest in [facility] and [facility], as well as of the fact that they are under no obligation to receive care at these facilities.  PASRR submitted to EDS on       PASRR number received on       Existing PASRR number confirmed on       FL2 transmitted to all facilities in geographic area requested by pt/family on       FL2 transmitted to all facilities within larger geographic area on       Patient informed that his/her managed care company has contracts with or will negotiate with certain facilities, including the following:            Patient/family informed of bed offers received.  Patient chooses bed at       Physician recommends and patient chooses bed at      Patient to be transferred to   on  .  Patient to be transferred to facility by       Patient family notified on   of transfer.  Name of family member notified:        PHYSICIAN Please sign FL2     Additional Comment:    _______________________________________________ Wende Neighbors, LCSW 01/09/2017, 12:10 PM

## 2017-01-09 NOTE — Clinical Social Work Note (Signed)
Clinical Social Work Assessment  Patient Details  Name: Elizabeth Stone MRN: [id number] Date of Birth: [birth year]  Date of referral:  01/09/17               Reason for consult:  Abuse/Neglect, Discharge Planning                Permission sought to share information with:  Family Supports Permission granted to share information::  Yes, Verbal Permission Granted  Name::     Ricci Barker  Agency::     Relationship::  son  Contact Information:  [phone]  Housing/Transportation Living arrangements for the past 2 months:  Single Family Home Source of Information:    Patient Interpreter Needed:  None Criminal Activity/Legal Involvement Pertinent to Current Situation/Hospitalization:  No - Comment as needed Significant Relationships:  Adult Children Lives with:  Self Do you feel safe going back to the place where you live?  Yes Need for family participation in patient care:  Yes (Comment)  Care giving concerns:  Patient stated she has support from adult children  Social Worker assessment / plan: Clinical Social Worker met patient at bedside to offer support and discuss discharge needs. Patient stated she has been to a SNF in the past and would like to return to [facility]. Patient stated that [facility] should have already been set up by her doctors office. CSW to complete necessary paperwork and initiate SNF search on patient behalf. CSW to follow up with patient once bed offers are available.  Employment status:  Retired Forensic scientist:  Medicare PT Recommendations:  [place] / Referral to community resources:  [place]  Patient/Family's Response to care: Patient verbalized appreciation and understanding for CSW role and involvement in care. Patient agreeable with current discharge plan to SNF  Patient/Family's Understanding of and Emotional Response to Diagnosis, Current Treatment, and Prognosis:  Patient with good understanding of  current medical state and limitations around most recent hospitalization. Patent agreeable with SNF placement in hopes of transitioning home.  Emotional Assessment Appearance:  Appears younger than stated age Attitude/Demeanor/Rapport:  Other, Complaining (complaining of pain) Affect (typically observed):  Stable Orientation:  Oriented to  Time, Oriented to Situation, Oriented to Place, Oriented to Self Alcohol / Substance use:  Not Applicable Psych involvement (Current and /or in the community):  No (Comment)  Discharge Needs  Concerns to be addressed:  No discharge needs identified Readmission within the last 30 days:  No Current discharge risk:  None Barriers to Discharge:  No Barriers Identified   Wende Neighbors, LCSW 01/09/2017, 12:04 PM

## 2017-01-09 NOTE — Discharge Summary (Addendum)
Patient ID: Elizabeth Stone MRN: [id number] DOB/AGE: 02-02-1946 71 y.o.  Admit date: 01/07/2017 Discharge date: 01/10/2017  Admission Diagnoses:  Active Problems:   S/P reverse total shoulder arthroplasty, left   Discharge Diagnoses:  Same  Past Medical History:  Diagnosis Date  . Acute blood loss anemia    hx  . Anxiety   . Arthritis   . Bipolar disorder ([place])   . Depression   . Dyspnea   . Fibromyalgia   . GERD (gastroesophageal reflux disease)   . Hearing impaired    has cochlear implant  . HTN (hypertension)   . Hypokalemia   . Meniere disease    takes HCTZ for her inner ear problem  . Mixed hyperlipidemia 09/27/2013  . Osteoporosis   . Pneumonia 11/2016  . Primary localized osteoarthritis of right knee   . Slow transit constipation   . Unsteady gait     Surgeries: Procedure(s): REVERSE LEFT TOTAL SHOULDER ARTHROPLASTY on 01/07/2017   Consultants: None/Social Work  Discharged Condition: Improved  Hospital Course: Elizabeth Stone is an 71 y.o. female who was admitted 01/07/2017 for operative treatment of Left shoulder arthritis. Patient has severe unremitting pain that affects sleep, daily activities, and work/hobbies. After pre-op clearance the patient was taken to the operating room on 01/07/2017 and underwent  Procedure(s): REVERSE LEFT TOTAL SHOULDER ARTHROPLASTY.    Patient was given perioperative antibiotics: Anti-infectives    Start     Dose/Rate Route Frequency Ordered Stop   01/07/17 1800  ceFAZolin (ANCEF) IVPB 1 g/50 mL premix     1 g 100 mL/hr over 30 Minutes Intravenous Every 6 hours 01/07/17 1745 01/08/17 0553   01/07/17 0845  ceFAZolin (ANCEF) IVPB 2g/100 mL premix     2 g 200 mL/hr over 30 Minutes Intravenous To Surgery 01/07/17 0839 01/07/17 1015       Patient was given sequential compression devices, early ambulation, and chemoprophylaxis to prevent DVT.  Patient benefited maximally from hospital stay and there were no complications.    Recent  vital signs: Patient Vitals for the past 24 hrs:  BP Temp Temp src Pulse Resp SpO2  01/09/17 0459 (!) 153/60 98.6 F (37 C) Oral 97 18 96 %  01/08/17 2300 (!) 100/45 98.7 F (37.1 C) Oral 77 17 94 %  01/08/17 2017 (!) 141/53 99.1 F (37.3 C) Oral 86 - 95 %  01/08/17 1552 (!) 133/50 98.9 F (37.2 C) Oral 89 17 96 %     Recent laboratory studies:  Recent Labs  01/08/17 0252  WBC 14.2*  HGB 11.9*  HCT 35.8*  PLT 258  NA 134*  K 3.6  CL 96*  CO2 28  BUN 13  CREATININE 0.72  GLUCOSE 157*  CALCIUM 8.8*     Discharge Medications:   Allergies as of 01/09/2017      Reactions   Latex Hives, Rash   Tape   "No problems with gloves"      Medication List    STOP taking these medications   HYDROcodone-acetaminophen 10-325 MG tablet Commonly known as:  NORCO     TAKE these medications   acetaminophen 500 MG tablet Commonly known as:  TYLENOL Take 500-1,000 mg by mouth every 6 (six) hours as needed (for pain.).   aspirin EC 81 MG tablet Take 81 mg by mouth daily.   atorvastatin 10 MG tablet Commonly known as:  LIPITOR Take 10 mg by mouth at bedtime.   calcium carbonate 600 MG Tabs tablet Commonly known  as:  OS-CAL Take 600 mg by mouth 2 (two) times daily.   celecoxib 200 MG capsule Commonly known as:  CELEBREX Take 200 mg by mouth daily with breakfast.   cholecalciferol 1000 units tablet Commonly known as:  VITAMIN D Take 1,000 Units by mouth at bedtime.   clonazePAM 0.25 MG disintegrating tablet Commonly known as:  KLONOPIN Take 0.25 mg by mouth See admin instructions. Pt takes two 0.25 mg at bedtime, and may take 0.25 mg twice daily as needed   diphenhydrAMINE 12.5 MG chewable tablet Commonly known as:  BENADRYL Chew 10 mg by mouth at bedtime as needed for allergies. sublingual   docusate sodium 50 MG capsule Commonly known as:  COLACE Take 50 mg by mouth daily as needed for mild constipation. What changed:  Another medication with the same name was  added. Make sure you understand how and when to take each.   docusate sodium 100 MG capsule Commonly known as:  COLACE Take 1 capsule (100 mg total) by mouth 3 (three) times daily as needed. What changed:  You were already taking a medication with the same name, and this prescription was added. Make sure you understand how and when to take each.   doxepin 50 MG capsule Commonly known as:  SINEQUAN Take 50 mg by mouth at bedtime as needed (sleep/anxiety.).   DULoxetine 60 MG capsule Commonly known as:  CYMBALTA Take 60 mg by mouth 2 (two) times daily.   Fish Oil 1200 MG Caps Take 1,200 mg by mouth daily.   fluticasone 50 MCG/ACT nasal spray Commonly known as:  FLONASE Place 1 spray into both nostrils daily as needed for allergies or rhinitis.   hydrochlorothiazide 25 MG tablet Commonly known as:  HYDRODIURIL Take 25 mg by mouth Daily.   HYDROcodone-homatropine 5-1.5 MG/5ML syrup Commonly known as:  HYCODAN Take 5 mLs by mouth every 6 (six) hours as needed for cough.   KLOR-CON M20 20 MEQ tablet Generic drug:  potassium chloride SA Take 20 mEq by mouth daily.   lamoTRIgine 200 MG tablet Commonly known as:  LAMICTAL Take 200 mg by mouth at bedtime.   montelukast 10 MG tablet Commonly known as:  SINGULAIR Take 10 mg by mouth at bedtime.   OVER THE COUNTER MEDICATION Take 10 mg by mouth at bedtime. saphris    rx not over the counter   OVER THE COUNTER MEDICATION melatoin 3mg  --3tabs pm   oxyCODONE 5 MG immediate release tablet Commonly known as:  ROXICODONE Take 1-2 tablets (5-10 mg total) by mouth every 4 (four) hours as needed.   oxymetazoline 0.05 % nasal spray Commonly known as:  AFRIN Place 1-2 sprays into both nostrils 2 (two) times daily as needed for congestion.   polyethylene glycol packet Commonly known as:  MIRALAX / GLYCOLAX Take 17 g by mouth daily as needed for moderate constipation.   sennosides-docusate sodium 8.6-50 MG tablet Commonly known as:   SENOKOT-S Take 1 tablet by mouth daily as needed for constipation.   vitamin C 500 MG tablet Commonly known as:  ASCORBIC ACID Take 500 mg by mouth 2 (two) times daily.       Diagnostic Studies: Dg Chest 2 View  Result Date: 12/30/2016 CLINICAL DATA:  Preoperative evaluation for left shoulder replacement EXAM: CHEST  2 VIEW COMPARISON:  December 02, 2016 FINDINGS: Lungs are clear. Heart size and pulmonary vascularity are normal. No adenopathy. There is thoracolumbar levoscoliosis. Patient has had total shoulder replacement on the right. There is  advanced arthropathy in the left shoulder. IMPRESSION: No edema or consolidation. Stable cardiac silhouette. Advanced arthropathy left shoulder. Status post right shoulder replacement. Electronically Signed   By: Lowella Grip III M.D.   On: 12/30/2016 15:23   Dg Shoulder Left Port  Result Date: 01/07/2017 CLINICAL DATA:  Status post reverse shoulder arthroplasty EXAM: LEFT SHOULDER - 1 VIEW COMPARISON:  None. FINDINGS: Reverse shoulder arthroplasty is noted on the left in satisfactory position. Some degenerative changes of the acromioclavicular joint are noted. No acute abnormality is seen. IMPRESSION: Status post left shoulder arthroplasty. Electronically Signed   By: Inez Catalina M.D.   On: 01/07/2017 14:56    Disposition: 02-Transferred to [facility]   POD #2 s/p left reverse TSA  - sling to left UE at all times - transition to PO oxy today - D/c to [place], MD. Schedule an appointment as soon as possible for a visit on 01/25/2017.   Specialty:  Orthopedic Surgery Contact information: [address] [place] [address] [phone]            Signed: Sinclair Ship 01/09/2017, 12:47 PM

## 2017-01-09 NOTE — Progress Notes (Signed)
    Patient doing well s/p left reverse TSA, pending d/c to [facility] place Education officer, museum has not yet seen patient for transfer arrangements Reports moderate to severe left shoulder pain Has not had any PO pain meds today, only IV   Physical Exam: Vitals:   01/08/17 2300 01/09/17 0459  BP: (!) 100/45 (!) 153/60  Pulse: 77 97  Resp: 17 18  Temp: 98.7 F (37.1 C) 98.6 F (37 C)    Dressing in place NVI  POD #2 s/p left reverse TSA  - sling to left UE at all times - transition to PO oxy today - likely d/c to [place] place once bed has been arranged, nurse to look into social work consult (order was placed 2 days ago)

## 2017-01-09 NOTE — Progress Notes (Signed)
Occupational Therapy Treatment Patient Details Name: Elizabeth Stone MRN: [id number] DOB: 12-16-1945 Today's Date: 01/09/2017    History of present illness 71 yo s/p L reverse TSA. PMH " Menier's Disease, L TKA, arthritis, anxiety, Bi-polar, fibromyalgia, CERD, hearing impaired (cochlear implant), HTN   OT comments  Pt reporting significant shoulder pain today but agreeable to participate in therapy session with focus on LUE exercises. Pt able to perform elbow, wrist, hand AROM x10 each. Pt required supervision for bed mobility and cues for LUE NWB and min guard for stand pivot transfer due to unsteadiness in standing. D/c plan remains appropriate. Will continue to follow acutely.   Follow Up Recommendations  SNF;Supervision/Assistance - 24 hour    Equipment Recommendations  None recommended by OT    Recommendations for Other Services      Precautions / Restrictions Precautions Precautions: Fall;Shoulder Type of Shoulder Precautions: AROM elbow/wrist/hand only no shoulder ROM Shoulder Interventions: At all times;Off for dressing/bathing/exercises;Shoulder sling/immobilizer Precaution Comments: Reviewed precuations with pt Required Braces or Orthoses: Sling Restrictions Weight Bearing Restrictions: Yes LUE Weight Bearing: Non weight bearing       Mobility Bed Mobility Overal bed mobility: Needs Assistance Bed Mobility: Supine to Sit     Supine to sit: Supervision     General bed mobility comments: increased time. cues for no weight bearing  Transfers Overall transfer level: Needs assistance Equipment used: None Transfers: Sit to/from [facility] Sit to Stand: Min guard Stand pivot transfers: Min guard       General transfer comment: Min guard for safety; unsteady throghout transfer    Balance Overall balance assessment: Needs assistance Sitting-balance support: Feet supported;No upper extremity supported Sitting balance-Leahy Scale: Good      Standing balance support: Single extremity supported Standing balance-Leahy Scale: Poor                             ADL either performed or assessed with clinical judgement   ADL Overall ADL's : Needs assistance/impaired                 Upper Body Dressing : Maximal assistance;Sitting Upper Body Dressing Details (indicate cue type and reason): to doff/don sling     Toilet Transfer: Min Designer, jewellery Details (indicate cue type and reason): Simulated by bed > chair         Functional mobility during ADLs: Min guard (for stand pivot to chair)       Vision       Perception     Praxis      Cognition Arousal/Alertness: Awake/alert Behavior During Therapy: WFL for tasks assessed/performed Overall Cognitive Status: Within Functional Limits for tasks assessed                                          Exercises Shoulder Exercises Elbow Flexion: AROM;Left;10 reps;Seated Elbow Extension: AROM;Left;10 reps;Seated Wrist Flexion: AROM;Left;10 reps;Seated Wrist Extension: AROM;Left;10 reps;Seated Digit Composite Flexion: AROM;Left;10 reps;Seated Composite Extension: AROM;Left;10 reps;Seated Donning/doffing sling/immobilizer: Maximal assistance Correct positioning of sling/immobilizer: Maximal assistance ROM for elbow, wrist and digits of operated UE: Minimal assistance Positioning of UE while sleeping: Minimal assistance   Shoulder Instructions Shoulder Instructions Donning/doffing sling/immobilizer: Maximal assistance Correct positioning of sling/immobilizer: Maximal assistance ROM for elbow, wrist and digits of operated UE: Minimal assistance Positioning of UE while sleeping: Minimal assistance  General Comments      Pertinent Vitals/ Pain       Pain Assessment: Faces Faces Pain Scale: Hurts even more Pain Location: L shoulder Pain Descriptors / Indicators: Aching;Sore Pain Intervention(s): Monitored during  session;Repositioned;Ice applied  Home Living                                          Prior Functioning/Environment              Frequency  Min 2X/week        Progress Toward Goals  OT Goals(current goals can now be found in the care plan section)  Progress towards OT goals: Progressing toward goals  Acute Rehab OT Goals Patient Stated Goal: decrease pain OT Goal Formulation: With patient  Plan Discharge plan remains appropriate    Co-evaluation                 End of Session Equipment Utilized During Treatment: Other (comment) (sling)  OT Visit Diagnosis: Unsteadiness on feet (R26.81);Pain;Muscle weakness (generalized) (M62.81) Pain - Right/Left: Left Pain - part of body: Shoulder   Activity Tolerance Patient tolerated treatment well   Patient Left in chair;with call bell/phone within reach   Nurse Communication          Time: 0950-1005 OT Time Calculation (min): 15 min  Charges: OT General Charges $OT Visit: 1 Procedure OT Treatments $Therapeutic Exercise: 8-22 mins  Shedric Fredericks A. Ulice Brilliant, M.S., OTR/L Pager: [place] 01/09/2017, 10:40 AM

## 2017-01-10 NOTE — Progress Notes (Signed)
    Patient doing well s/p left reverse TSA per Dr Tamera Punt, pending d/c to [facility] Social worker evaluated pt yesterday, no new updates today  Reports moderate left shoulder pain, better controlled today    Physical Exam: BP (!) 104/48 (BP Location: Right Arm)   Pulse 88   Temp 98.7 F (37.1 C) (Oral)   Resp 18   Wt 70.8 kg (156 lb)   LMP  (LMP Unknown)   SpO2 96%   BMI 30.47 kg/m   Dressing in place, CDI, distal compartments soft, distal pulses intact, sensation intact, NVI  POD #3 s/p left reverse TSA  - sling to left UE at all times - continue PO oxy  - d/c to [facility] once bed has been arranged, awaiting social work

## 2017-01-11 NOTE — Care Management Important Message (Signed)
Important Message  Patient Details  Name: Elizabeth Stone MRN: [id number] Date of Birth: May 06, 1946   Medicare Important Message Given:  Yes    Orbie Pyo 01/11/2017, 12:44 PM

## 2017-01-11 NOTE — Progress Notes (Signed)
   PATIENT ID: Elizabeth Stone   4 Days Post-Op Procedure(s) (LRB): REVERSE LEFT TOTAL SHOULDER ARTHROPLASTY (Left)  Subjective: Doing well, still some pain but doing okay with oral oxycodone.  Objective:  Vitals:   01/10/17 1300 01/11/17 0501  BP: (!) 112/57 (!) 127/56  Pulse: 84 86  Resp: 18 18  Temp: 98.8 F (37.1 C) 99.1 F (37.3 C)     L UE dressing c/d/I Wiggles fingers, distally NVI  Labs:  No results for input(s): HGB in the last 72 hours.No results for input(s): WBC, RBC, HCT, PLT in the last 72 hours.No results for input(s): NA, K, CL, CO2, BUN, CREATININE, GLUCOSE, CALCIUM in the last 72 hours.  Assessment and Plan: 4 days s/p L reverse TSA D/c to [facility] today hopefully once bed is available, FL2 isnt in chart to sign Oxy for po medication, scripts in chart Fu with Dr. Tamera Punt in 2 weeks  VTE proph: ASA 325 mg, SCDs

## 2017-01-11 NOTE — Progress Notes (Signed)
Occupational Therapy Treatment Patient Details Name: Elizabeth Stone MRN: [id number] DOB: 11-19-1945 Today's Date: 01/11/2017    History of present illness 71 yo s/p L reverse TSA. PMH " Menier's Disease, L TKA, arthritis, anxiety, Bi-polar, fibromyalgia, CERD, hearing impaired (cochlear implant), HTN   OT comments  Pt required two visits to complete session due to required encouragement to participate. Pt able to return demonstrate but poor recall of AROM wrist/ elbow and digits. Pt reviewed adl handout in detail. Pt plans to d/c SNF and recommend this as follow up due to need for max (A) with adls. Pt needed education on correct positioning of sling.    Follow Up Recommendations  SNF;Supervision/Assistance - 24 hour    Equipment Recommendations  None recommended by OT    Recommendations for Other Services      Precautions / Restrictions Precautions Precautions: Fall;Shoulder Type of Shoulder Precautions: AROM elbow/wrist/hand only no shoulder ROM Shoulder Interventions: At all times;Off for dressing/bathing/exercises;Shoulder sling/immobilizer Precaution Comments: handout reviewed in detail and highlighted in pink NO AROM at L shoulder Required Braces or Orthoses: Sling Restrictions Weight Bearing Restrictions: Yes LUE Weight Bearing:  (needs reminders)       Mobility Bed Mobility Overal bed mobility: Needs Assistance Bed Mobility: Supine to Sit     Supine to sit: Supervision        Transfers Overall transfer level: Needs assistance   Transfers: Sit to/from Stand Sit to Stand: Min assist         General transfer comment: pt reaching for therapist. Pt positioned in chair to eat and immediately returning to bed upon OT return . Pt does not tolerate OOB for long periods    Balance Overall balance assessment: Needs assistance Sitting-balance support: No upper extremity supported;Feet supported Sitting balance-Leahy Scale: Fair                                      ADL either performed or assessed with clinical judgement   ADL Overall ADL's : Needs assistance/impaired Eating/Feeding: Minimal assistance Eating/Feeding Details (indicate cue type and reason): requires (A) to cut up food and setup / open containers Grooming: Minimal assistance                   Toilet Transfer: Minimal assistance Toilet Transfer Details (indicate cue type and reason): pt with hand held (A) from EOB to chair         Functional mobility during ADLs: Minimal assistance General ADL Comments: pt needed encouragement to exit bed and participate this AM. pt required food heated and additional beverages     Vision       Perception     Praxis      Cognition Arousal/Alertness: Awake/alert Behavior During Therapy: WFL for tasks assessed/performed Overall Cognitive Status: Within Functional Limits for tasks assessed                                          Exercises Shoulder Exercises Elbow Flexion: AROM;Left;10 reps;Supine Elbow Extension: AROM;Left;10 reps;Supine Wrist Flexion: AROM;Left;10 reps;Supine Wrist Extension: AROM;Left;10 reps;Supine Digit Composite Flexion: AROM;Left;10 reps;Supine Composite Extension: AROM;Left;10 reps;Supine Donning/doffing shirt without moving shoulder: Maximal assistance Method for sponge bathing under operated UE: Maximal assistance Donning/doffing sling/immobilizer: Maximal assistance Correct positioning of sling/immobilizer: Maximal assistance ROM for elbow, wrist and digits of  operated UE: Supervision/safety Sling wearing schedule (on at all times/off for ADL's): Maximal assistance Proper positioning of operated UE when showering: Maximal assistance Positioning of UE while sleeping: Maximal assistance   Shoulder Instructions Shoulder Instructions Donning/doffing shirt without moving shoulder: Maximal assistance Method for sponge bathing under operated UE: Maximal  assistance Donning/doffing sling/immobilizer: Maximal assistance Correct positioning of sling/immobilizer: Maximal assistance ROM for elbow, wrist and digits of operated UE: Supervision/safety Sling wearing schedule (on at all times/off for ADL's): Maximal assistance Proper positioning of operated UE when showering: Maximal assistance Positioning of UE while sleeping: Maximal assistance     General Comments dressing dry and intact    Pertinent Vitals/ Pain       Pain Assessment: Faces Faces Pain Scale: Hurts little more Pain Location: L shoulder Pain Descriptors / Indicators: Sore Pain Intervention(s): Monitored during session;Premedicated before session;Repositioned;Ice applied  Home Living                                          Prior Functioning/Environment              Frequency  Min 2X/week        Progress Toward Goals  OT Goals(current goals can now be found in the care plan section)  Progress towards OT goals: Progressing toward goals  Acute Rehab OT Goals Patient Stated Goal: decrease pain OT Goal Formulation: With patient Time For Goal Achievement: 01/15/17 Potential to Achieve Goals: Good ADL Goals Pt Will Perform Grooming: with set-up;with supervision;sitting Pt Will Perform Upper Body Bathing: with min assist;sitting Pt Will Perform Upper Body Dressing: with min assist;sitting Pt Will Transfer to Toilet: with supervision;ambulating;bedside commode Pt Will Perform Toileting - Clothing Manipulation and hygiene: with supervision;sitting/lateral leans;with adaptive equipment Pt/caregiver will Perform Home Exercise Program: Increased ROM;Left upper extremity;With written HEP provided Additional ADL Goal #2: Pt will verbalize understandign of proper positioning of LUE in sitting and supine.  Plan Discharge plan remains appropriate    Co-evaluation                 End of Session    OT Visit Diagnosis: Unsteadiness on feet  (R26.81);Pain;Muscle weakness (generalized) (M62.81) Pain - Right/Left: Left Pain - part of body: Shoulder   Activity Tolerance Patient tolerated treatment well   Patient Left in bed;with call bell/phone within reach   Nurse Communication Mobility status;Precautions        Time: 0867 [phone]) OT Time Calculation (min): 9 min  Charges: OT General Charges $OT Visit: 1 Procedure OT Treatments $Self Care/Home Management : 8-22 mins   Jeri Modena   OTR/L Pager: [phone] Office: [phone] .    Parke Poisson B 01/11/2017, 9:03 AM

## 2017-01-11 NOTE — Progress Notes (Signed)
OT Evaluation - late entry   01/08/17 1100  OT Visit Information  Last OT Received On 01/08/17  Assistance Needed +1  History of Present Illness 71 yo s/p L reverse TSA. PMH " Menier's Disease, L TKA, arthritis, anxiety, Bi-polar, fibromyalgia, CERD, hearing impaired (cochlear implant), HTN  Precautions  Precautions Fall;Shoulder  Type of Shoulder Precautions AROM elbow/wrist/hand only no shoulder ROM  Shoulder Interventions At all times;Off for dressing/bathing/exercises;Shoulder sling/immobilizer  Precaution Booklet Issued Yes (comment)  Restrictions  Weight Bearing Restrictions Yes  LUE Weight Bearing NWB  Home Living  Family/patient expects to be discharged to: Skilled nursing facility  Prior Function  Level of Independence Independent  Comments lives alone; drives  Communication  Communication HOH (wears hearing aids/cochlear implant; reads lips)  Pain Assessment  Pain Assessment 0-10  Pain Score 9  Pain Location shoulder -L  Pain Descriptors / Indicators Aching;Discomfort  Pain Intervention(s) Limited activity within patient's tolerance;Patient requesting pain meds-RN notified;Repositioned;Ice applied  Cognition  Arousal/Alertness Awake/alert  Behavior During Therapy WFL for tasks assessed/performed  Overall Cognitive Status Within Functional Limits for tasks assessed  Upper Extremity Assessment  Upper Extremity Assessment RUE deficits/detail;LUE deficits/detail  RUE Deficits / Details limitations of R shoulder at baseline. Difficulty reaching behind back. FF @ 90 - compensates with abduction  RUE Coordination decreased gross motor  LUE Deficits / Details L hand edema s/p surgery. Elbow/wrist/hand ROM WFL.   LUE Coordination decreased fine motor;decreased gross motor  Lower Extremity Assessment  Lower Extremity Assessment Generalized weakness ("I have a bad knee")  Cervical / Trunk Assessment  Cervical / Trunk Assessment Kyphotic  ADL  Overall ADL's  Needs  assistance/impaired  Eating/Feeding Set up  Grooming Moderate assistance  Upper Body Bathing Moderate assistance;Sitting  Lower Body Bathing Minimal assistance;Sit to/from stand  Upper Body Dressing  Moderate assistance;Sitting  Lower Body Dressing Minimal assistance;Sit to/from Retail buyer Minimal assistance;Ambulation  Toileting- Clothing Manipulation and Hygiene Moderate assistance  Toileting - Clothing Manipulation Details (indicate cue type and reason) difficulty cleaning after BM; pt uses B hands at basleine  Functional mobility during ADLs Minimal assistance (HHA)  General ADL Comments Significant decrase in functional status  Vision- History  Baseline Vision/History Wears glasses  Bed Mobility  Overal bed mobility Modified Independent  Transfers  Overall transfer level Needs assistance  Equipment used 1 person hand held assist  Transfers Sit to/from [employer] Transfers  Sit to Stand Min assist  Stand pivot transfers Min assist  General transfer comment unsteady. risk for falls  Balance  Overall balance assessment Needs assistance  Sitting balance-Leahy Scale Good  Standing balance-Leahy Scale Poor  Exercises  Exercises Shoulder  Shoulder Instructions  Donning/doffing shirt without moving shoulder Maximal assistance  Method for sponge bathing under operated UE Moderate assistance  Donning/doffing sling/immobilizer Moderate assistance  Correct positioning of sling/immobilizer Moderate assistance  ROM for elbow, wrist and digits of operated UE Minimal assistance  Sling wearing schedule (on at all times/off for ADL's) Minimal assistance  Proper positioning of operated UE when showering Minimal assistance  Positioning of UE while sleeping Minimal assistance  Shoulder Exercises  Elbow Flexion AROM;Left;10 reps;Supine  Elbow Extension AROM;Left;10 reps;Supine  Wrist Flexion Left;10 reps;AROM  Wrist Extension AROM;Left;10 reps  Digit Composite Flexion  AROM;Left;10 reps (squeeze ball)  Composite Extension AROM;Left;10 reps  OT - End of Session  Activity Tolerance Patient tolerated treatment well  Patient left in chair;with call bell/phone within reach  Nurse Communication Mobility status;Precautions  OT Assessment  OT Recommendation/Assessment  Patient needs continued OT Services  OT Visit Diagnosis Unsteadiness on feet (R26.81);Pain;Muscle weakness (generalized) (M62.81)  Pain - Right/Left Left  Pain - part of body Shoulder  OT Problem List Decreased strength;Decreased range of motion;Decreased activity tolerance;Impaired balance (sitting and/or standing);Decreased coordination;Decreased knowledge of use of DME or AE;Decreased knowledge of precautions;Obesity;Impaired UE functional use;Pain;Increased edema  OT Plan  OT Frequency (ACUTE ONLY) Min 2X/week  OT Treatment/Interventions (ACUTE ONLY) Self-care/ADL training;Therapeutic exercise;DME and/or AE instruction;Therapeutic activities;Patient/family education;Balance training  AM-PAC OT "6 Clicks" Daily Activity Outcome Measure  Help from another person eating meals? 3  Help from another person taking care of personal grooming? 2  Help from another person toileting, which includes using toliet, bedpan, or urinal? 2  Help from another person bathing (including washing, rinsing, drying)? 2  Help from another person to put on and taking off regular upper body clothing? 2  Help from another person to put on and taking off regular lower body clothing? 3  6 Click Score 14  ADL G Code Conversion CK  OT Recommendation  Follow Up Recommendations SNF;Supervision/Assistance - 24 hour  OT Equipment None recommended by OT  Individuals Consulted  Consulted and Agree with Results and Recommendations Patient  Acute Rehab OT Goals  Patient Stated Goal to be independent again  OT Goal Formulation With patient  Time For Goal Achievement 01/15/17  Potential to Achieve Goals Good  OT Time Calculation   OT Start Time (ACUTE ONLY) 1048  OT Stop Time (ACUTE ONLY) 1111  OT Time Calculation (min) 23 min  OT General Charges  $OT Visit 1 Procedure  OT Evaluation  $OT Eval Moderate Complexity 1 Procedure  OT Treatments  $Self Care/Home Management  8-22 mins  Written Expression  Dominant Hand Right  [facility], OT/L  [phone] 01/08/2017

## 2017-01-11 NOTE — Clinical Social Work Note (Signed)
Clinical Social Worker facilitated patient discharge including contacting patient family and facility to confirm patient discharge plans.  Clinical information faxed to facility and family agreeable with plan.  CSW arranged ambulance transport via [facility] to [place] .  RN to call [phone] for report prior to discharge.  Clinical Social Worker will sign off for now as social work intervention is no longer needed. Please consult us again if new need arises.  Elizabeth Stone, LCSWA [phone]   

## 2017-01-11 NOTE — Progress Notes (Signed)
01/11/17  1805  Report called to [place] to [place].

## 2017-01-11 NOTE — NC FL2 (Signed)
[place] LEVEL OF CARE SCREENING TOOL     IDENTIFICATION  Patient Name: Elizabeth Stone Birthdate: 15-May-1946 Sex: female Admission Date (Current Location): 01/07/2017  [facility] and [place] Number:  Herbalist and Address:  [facility] [place], [place] [address], [place], [place] [address]      Provider Number: [id number]  Attending Physician Name and Address:  Tania Ade, MD  Relative Name and Phone Number:  Wille Glaser    Current Level of Care: Hospital Recommended Level of Care: [place] Prior Approval Number:    Date Approved/Denied:   PASRR Number:    Discharge Plan: SNF    Current Diagnoses: Patient Active Problem List   Diagnosis Date Noted  . S/P reverse total shoulder arthroplasty, left 01/07/2017  . Substance use disorder 10/25/2016  . Severe episode of recurrent major depressive disorder, without psychotic features ([place])   . Primary localized osteoarthritis of right knee   . Rotator cuff tear arthropathy 12/13/2014  . Ulcer of right second toe ([place]) 12/21/2013  . Family history of ischemic heart disease 09/27/2013  . Essential hypertension, benign 09/27/2013  . Mixed hyperlipidemia 09/27/2013  . Arthritis   . Anxiety   . Depression   . GERD (gastroesophageal reflux disease)   . Meniere disease   . Osteoporosis   . Hearing impaired     Orientation RESPIRATION BLADDER Height & Weight     Self, Time, Situation, Place  Normal Continent Weight: 156 lb (70.8 kg) Height:     BEHAVIORAL SYMPTOMS/MOOD NEUROLOGICAL BOWEL NUTRITION STATUS      Continent  (Please see d/c summary)  AMBULATORY STATUS COMMUNICATION OF NEEDS Skin   Limited Assist Verbally Surgical wounds (Closed incision left shoulder)                       Personal Care Assistance Level of Assistance  Bathing, Feeding, Dressing Bathing Assistance: Limited assistance Feeding assistance: Independent Dressing Assistance: Limited assistance      Functional Limitations Info  Sight, Hearing, Speech Sight Info: Adequate Hearing Info: Adequate Speech Info: Adequate    SPECIAL CARE FACTORS FREQUENCY  OT (By licensed OT)       OT Frequency: 2x            Contractures Contractures Info: Not present    Additional Factors Info  Code Status, Allergies Code Status Info: Full Code Allergies Info: Latex           Current Medications (01/11/2017):  This is the current hospital active medication list Current Facility-Administered Medications  Medication Dose Route Frequency Provider Last Rate Last Dose  . 0.9 %  sodium chloride infusion   Intravenous Continuous Grier Mitts, PA-C 100 mL/hr at 01/07/17 2135    . acetaminophen (TYLENOL) tablet 650 mg  650 mg Oral Q6H PRN Grier Mitts, PA-C       Or  . acetaminophen (TYLENOL) suppository 650 mg  650 mg Rectal Q6H PRN Grier Mitts, PA-C      . alum & mag hydroxide-simeth (MAALOX/MYLANTA) 200-200-20 MG/5ML suspension 30 mL  30 mL Oral Q4H PRN Grier Mitts, PA-C      . aspirin EC tablet 325 mg  325 mg Oral Daily Grier Mitts, PA-C   325 mg at 01/11/17 0910  . atorvastatin (LIPITOR) tablet 10 mg  10 mg Oral QHS Grier Mitts, PA-C   10 mg at 01/10/17 2130  . bisacodyl (DULCOLAX) EC tablet 5 mg  5 mg Oral  Daily PRN Grier Mitts, PA-C      . clonazePAM Bobbye Charleston) tablet 0.25 mg  0.25 mg Oral BID PRN Grier Mitts, PA-C   0.25 mg at 01/08/17 2150  . clonazePAM (KLONOPIN) tablet 0.5 mg  0.5 mg Oral QHS Phylliss Bob, MD   0.5 mg at 01/10/17 2130  . cyclobenzaprine (FLEXERIL) tablet 5 mg  5 mg Oral QHS Tania Ade, MD   5 mg at 01/10/17 2130  . diphenhydrAMINE (BENADRYL) 12.5 MG/5ML elixir 12.5-25 mg  12.5-25 mg Oral Q4H PRN Grier Mitts, PA-C      . docusate sodium (COLACE) capsule 100 mg  100 mg Oral BID Grier Mitts, PA-C   100 mg at 01/11/17 0910  . doxepin (SINEQUAN) capsule 50 mg  50 mg Oral QHS PRN Grier Mitts,  PA-C      . DULoxetine (CYMBALTA) DR capsule 60 mg  60 mg Oral BID Grier Mitts, PA-C   60 mg at 01/11/17 0910  . hydrochlorothiazide (HYDRODIURIL) tablet 25 mg  25 mg Oral Daily Grier Mitts, PA-C   25 mg at 01/11/17 0916  . HYDROmorphone (DILAUDID) injection 0.5-1 mg  0.5-1 mg Intravenous Q2H PRN Lennie Muckle McKenzie, PA-C   1 mg at 01/10/17 1637  . menthol-cetylpyridinium (CEPACOL) lozenge 3 mg  1 lozenge Oral PRN Grier Mitts, PA-C       Or  . phenol (CHLORASEPTIC) mouth spray 1 spray  1 spray Mouth/Throat PRN Grier Mitts, PA-C      . metoCLOPramide (REGLAN) tablet 5-10 mg  5-10 mg Oral Q8H PRN Grier Mitts, PA-C       Or  . metoCLOPramide (REGLAN) injection 5-10 mg  5-10 mg Intravenous Q8H PRN Grier Mitts, PA-C      . montelukast (SINGULAIR) tablet 10 mg  10 mg Oral QHS Grier Mitts, PA-C   10 mg at 01/10/17 2130  . morphine 2 MG/ML injection 1-2 mg  1-2 mg Intravenous Q2H PRN Grier Mitts, PA-C   2 mg at 01/08/17 1931  . ondansetron (ZOFRAN) tablet 4 mg  4 mg Oral Q6H PRN Grier Mitts, PA-C       Or  . ondansetron (ZOFRAN) injection 4 mg  4 mg Intravenous Q6H PRN Grier Mitts, PA-C      . oxyCODONE (Oxy IR/ROXICODONE) immediate release tablet 5-10 mg  5-10 mg Oral Q3H PRN Grier Mitts, PA-C   10 mg at 01/11/17 0457  . polyethylene glycol (MIRALAX / GLYCOLAX) packet 17 g  17 g Oral Daily PRN Grier Mitts, PA-C      . potassium chloride SA (K-DUR,KLOR-CON) CR tablet 20 mEq  20 mEq Oral Daily Grier Mitts, PA-C   20 mEq at 01/11/17 0910  . sodium phosphate (FLEET) 7-19 GM/118ML enema 1 enema  1 enema Rectal Once PRN Grier Mitts, PA-C      . zolpidem (AMBIEN) tablet 5 mg  5 mg Oral QHS PRN Grier Mitts, PA-C   5 mg at 01/08/17 0003     Discharge Medications: Please see discharge summary for a list of discharge medications.  Relevant Imaging Results:  Relevant Lab Results:   Additional  Information SS#[id number]  Gerrianne Scale Shonna Deiter, LCSW

## 2017-01-12 ENCOUNTER — Non-Acute Institutional Stay (SKILLED_NURSING_FACILITY): Payer: Medicare Other | Admitting: Internal Medicine

## 2017-01-12 ENCOUNTER — Encounter: Payer: Self-pay | Admitting: Internal Medicine

## 2017-01-12 DIAGNOSIS — E871 Hypo-osmolality and hyponatremia: Secondary | ICD-10-CM | POA: Diagnosis not present

## 2017-01-12 DIAGNOSIS — D72829 Elevated white blood cell count, unspecified: Secondary | ICD-10-CM | POA: Diagnosis not present

## 2017-01-12 DIAGNOSIS — R531 Weakness: Secondary | ICD-10-CM | POA: Diagnosis not present

## 2017-01-12 DIAGNOSIS — D62 Acute posthemorrhagic anemia: Secondary | ICD-10-CM | POA: Diagnosis not present

## 2017-01-12 DIAGNOSIS — M12812 Other specific arthropathies, not elsewhere classified, left shoulder: Secondary | ICD-10-CM

## 2017-01-12 DIAGNOSIS — I10 Essential (primary) hypertension: Secondary | ICD-10-CM

## 2017-01-12 DIAGNOSIS — M75102 Unspecified rotator cuff tear or rupture of left shoulder, not specified as traumatic: Secondary | ICD-10-CM

## 2017-01-12 DIAGNOSIS — E782 Mixed hyperlipidemia: Secondary | ICD-10-CM | POA: Diagnosis not present

## 2017-01-12 DIAGNOSIS — F332 Major depressive disorder, recurrent severe without psychotic features: Secondary | ICD-10-CM

## 2017-01-12 NOTE — Progress Notes (Signed)
LOCATION: [facility]  PCP: Blanchie Serve, MD   Code Status: Full Code  Goals of care: Advanced Directive information Advanced Directives 01/07/2017  Does Patient Have a Medical Advance Directive? -  Type of Advance Directive -  Does patient want to make changes to medical advance directive? -  Copy of Paint in Chart? No - copy requested  Would patient like information on creating a medical advance directive? -       Extended Emergency Contact Information Primary Emergency Contact: Myrtis Ser Address: [address]          [place], [place] [address] Johnnette Litter of [employer] Phone: [phone] Mobile Phone: [phone] Relation: Other Secondary Emergency Contact: Patton,John Address: [place], [place] [address] Johnnette Litter of [employer] Phone: [phone] Relation: Son   Allergies  Allergen Reactions  . Latex Hives and Rash    Tape   "No problems with gloves"    Chief Complaint  Patient presents with  . New Admit To SNF    New Admission Visit      HPI:  Patient is a 71 y.o. female seen today for short term rehabilitation post hospital admission from 01/07/17-01/10/17 with reverse total shoulder arthroplasty of left shoulder. She is seen in her room today. She has medical history of hypertension, GERD, hearing impairment, osteoporosis and osteoarthritis among others.  Review of Systems:  Constitutional: Negative for fever, chills, diaphoresis.  HENT: Negative for headache, congestion, nasal discharge, sore throat, difficulty swallowing.   Eyes: Negative for eye pain, blurred vision, double vision and discharge.  Respiratory: Negative for cough, shortness of breath and wheezing.   Cardiovascular: Negative for chest pain, palpitations, leg swelling.  Gastrointestinal: Negative for heartburn, nausea, vomiting, abdominal pain, loss of appetite. Last bowel movement was Sunday. At home has a bowel movement  every 2-3 days.  Genitourinary: Negative for dysuria and flank pain.  Musculoskeletal: Negative for back pain, fall in the facility. Positive for pain to the left shoulder and left knee.  Skin: Negative for itching, rash.  Neurological: Negative for dizziness. Psychiatric/Behavioral: Negative for depression   Past Medical History:  Diagnosis Date  . Acute blood loss anemia    hx  . Anxiety   . Arthritis   . Bipolar disorder (H[facility])   . Depression   . Dyspnea   . Fibromyalgia   . GERD (gastroesophageal reflux disease)   . Hearing impaired    has cochlear implant  . HTN (hypertension)   . Hypokalemia   . Meniere disease    takes HCTZ for her inner ear problem  . Mixed hyperlipidemia 09/27/2013  . Osteoporosis   . Pneumonia 11/2016  . Primary localized osteoarthritis of right knee   . Slow transit constipation   . Unsteady gait    Past Surgical History:  Procedure Laterality Date  . ABDOMINAL HYSTERECTOMY  1986  . bladder tack  2007  . COCHLEAR IMPLANT     20 07  . COLONOSCOPY    . HAMMER TOE SURGERY  2014   left foot  . REVERSE SHOULDER ARTHROPLASTY Right 12/13/2014   dr Tamera Punt  . REVERSE SHOULDER ARTHROPLASTY Right 12/13/2014   Procedure: REVERSE SHOULDER ARTHROPLASTY rt;  Surgeon: Nita Sells, MD;  Location: [facility];  Service: Orthopedics;  Laterality: Right;  Right reverse total shoulder replacement  . REVERSE SHOULDER ARTHROPLASTY Left 01/07/2017   Procedure: REVERSE LEFT TOTAL SHOULDER ARTHROPLASTY;  Surgeon: Larkin Ina  Tamera Punt, MD;  Location: [facility];  Service: Orthopedics;  Laterality: Left;  Left reveres total shoulder arthroplasty  . TOTAL KNEE ARTHROPLASTY Right 12/16/2015   Procedure: TOTAL KNEE ARTHROPLASTY;  Surgeon: Elsie Saas, MD;  Location: [facility];  Service: Orthopedics;  Laterality: Right;   Social History:   reports that she quit smoking about 23 years ago. She has a 5.75 pack-year smoking history. She has never used smokeless tobacco. She reports  that she drinks about 6.0 oz of alcohol per week . She reports that she does not use drugs.  Family History  Problem Relation Age of Onset  . Heart disease Mother   . Breast cancer Mother   . Cervical cancer Mother   . Diabetes Mother   . Heart disease Father   . Diabetes Maternal Aunt   . Colon cancer Neg Hx   . Esophageal cancer Neg Hx   . Rectal cancer Neg Hx   . Stomach cancer Neg Hx     Medications: Allergies as of 01/12/2017      Reactions   Latex Hives, Rash   Tape   "No problems with gloves"      Medication List       Accurate as of 01/12/17 12:32 PM. Always use your most recent med list.          acetaminophen 500 MG tablet Commonly known as:  TYLENOL Take 500-1,000 mg by mouth every 6 (six) hours as needed (for pain.).   aspirin EC 81 MG tablet Take 81 mg by mouth daily.   atorvastatin 10 MG tablet Commonly known as:  LIPITOR Take 10 mg by mouth at bedtime.   calcium carbonate 600 MG Tabs tablet Commonly known as:  OS-CAL Take 600 mg by mouth 2 (two) times daily.   celecoxib 200 MG capsule Commonly known as:  CELEBREX Take 200 mg by mouth daily with breakfast.   cholecalciferol 1000 units tablet Commonly known as:  VITAMIN D Take 1,000 Units by mouth at bedtime.   clonazePAM 0.25 MG disintegrating tablet Commonly known as:  KLONOPIN Take 0.25 mg by mouth See admin instructions. Pt takes  0.5 mg at bedtime, and may take 0.25 mg twice daily as needed   diphenhydrAMINE 12.5 MG chewable tablet Commonly known as:  BENADRYL Chew 10 mg by mouth at bedtime as needed for allergies. sublingual   docusate sodium 50 MG capsule Commonly known as:  COLACE Take 50 mg by mouth daily as needed for mild constipation.   docusate sodium 100 MG capsule Commonly known as:  COLACE Take 1 capsule (100 mg total) by mouth 3 (three) times daily as needed.   doxepin 50 MG capsule Commonly known as:  SINEQUAN Take 50 mg by mouth at bedtime as needed  (sleep/anxiety.).   DULoxetine 60 MG capsule Commonly known as:  CYMBALTA Take 60 mg by mouth 2 (two) times daily.   Fish Oil 1200 MG Caps Take 1,200 mg by mouth daily.   fluticasone 50 MCG/ACT nasal spray Commonly known as:  FLONASE Place 1 spray into both nostrils daily as needed for allergies or rhinitis.   hydrochlorothiazide 25 MG tablet Commonly known as:  HYDRODIURIL Take 25 mg by mouth Daily.   HYDROcodone-homatropine 5-1.5 MG/5ML syrup Commonly known as:  HYCODAN Take 5 mLs by mouth every 6 (six) hours as needed for cough.   KLOR-CON M20 20 MEQ tablet Generic drug:  potassium chloride SA Take 20 mEq by mouth daily.   lamoTRIgine 200 MG tablet Commonly  known as:  LAMICTAL Take 200 mg by mouth at bedtime.   montelukast 10 MG tablet Commonly known as:  SINGULAIR Take 10 mg by mouth at bedtime.   OVER THE COUNTER MEDICATION Take 10 mg by mouth at bedtime. saphris    rx not over the counter   OVER THE COUNTER MEDICATION melatoin 3mg  --3tabs pm qhs prn   oxyCODONE 5 MG immediate release tablet Commonly known as:  ROXICODONE Take 1-2 tablets (5-10 mg total) by mouth every 4 (four) hours as needed.   oxymetazoline 0.05 % nasal spray Commonly known as:  AFRIN Place 1-2 sprays into both nostrils 2 (two) times daily as needed for congestion.   polyethylene glycol packet Commonly known as:  MIRALAX / GLYCOLAX Take 17 g by mouth daily.   sennosides-docusate sodium 8.6-50 MG tablet Commonly known as:  SENOKOT-S Take 1 tablet by mouth daily as needed for constipation.   traMADol 50 MG tablet Commonly known as:  ULTRAM Take 50-100 mg by mouth every 4 (four) hours as needed.   vitamin C 500 MG tablet Commonly known as:  ASCORBIC ACID Take 500 mg by mouth 2 (two) times daily.       Immunizations: Immunization History  Administered Date(s) Administered  . PPD Test 12/21/2015     Physical Exam: Vitals:   01/12/17 1220  BP: 134/74  Pulse: 87  Resp: 20   Temp: 97.8 F (36.6 C)  TempSrc: Oral  SpO2: 96%  Weight: 156 lb (70.8 kg)  Height: 5' (1.524 m)   Body mass index is 30.47 kg/m.  General- elderly female, Obese, in no acute distress Head- normocephalic, atraumatic, has hearing aid Nose- no maxillary or frontal sinus tenderness, no nasal discharge Throat- moist mucus membrane Eyes- PERRLA, EOMI, no pallor, no icterus, no discharge, normal conjunctiva, normal sclera Neck- no cervical lymphadenopathy Cardiovascular- normal s1,s2, no murmur Respiratory- bilateral clear to auscultation, no wheeze, no rhonchi, no crackles, no use of accessory muscles Abdomen- bowel sounds present, soft, non tender, no guarding or rigidity, no CVA tenderness Musculoskeletal- left arm in a sling, able to move her left fingers, with good capillary refill, able to move all other 3 extremities, no leg edema  Neurological- alert and oriented to person, place and time Skin- warm and dry, surgical incision to left shoulder with Aquacel dressing in place Psychiatry- normal mood and affect    Labs reviewed: Basic Metabolic Panel:  Recent Labs  10/22/16 1551 12/30/16 1457 01/08/17 0252  NA 134* 136 134*  K 5.1 4.0 3.6  CL 102 104 96*  CO2 25 21* 28  GLUCOSE 90 304* 157*  BUN 15 11 13   CREATININE 0.91 0.71 0.72  CALCIUM 10.2 10.4* 8.8*   Liver Function Tests:  Recent Labs  12/30/16 1457  AST 41  ALT 35  ALKPHOS 110  BILITOT 0.4  PROT 7.5  ALBUMIN 4.3   No results for input(s): LIPASE, AMYLASE in the last 8760 hours. No results for input(s): AMMONIA in the last 8760 hours. CBC:  Recent Labs  10/22/16 1551 12/30/16 1457 01/08/17 0252  WBC 6.5 9.7 14.2*  NEUTROABS  --  7.4  --   HGB 13.8 15.5* 11.9*  HCT 39.6 46.8* 35.8*  MCV 93.8 92.9 93.2  PLT 211 222 258   Cardiac Enzymes: No results for input(s): CKTOTAL, CKMB, CKMBINDEX, TROPONINI in the last 8760 hours. BNP: Invalid input(s): POCBNP CBG:  Recent Labs  01/07/17 0909  01/07/17 1316  GLUCAP 141* 123*    Radiological  Exams: Dg Chest 2 View  Result Date: 12/30/2016 CLINICAL DATA:  Preoperative evaluation for left shoulder replacement EXAM: CHEST  2 VIEW COMPARISON:  December 02, 2016 FINDINGS: Lungs are clear. Heart size and pulmonary vascularity are normal. No adenopathy. There is thoracolumbar levoscoliosis. Patient has had total shoulder replacement on the right. There is advanced arthropathy in the left shoulder. IMPRESSION: No edema or consolidation. Stable cardiac silhouette. Advanced arthropathy left shoulder. Status post right shoulder replacement. Electronically Signed   By: Lowella Grip III M.D.   On: 12/30/2016 15:23   Dg Shoulder Left Port  Result Date: 01/07/2017 CLINICAL DATA:  Status post reverse shoulder arthroplasty EXAM: LEFT SHOULDER - 1 VIEW COMPARISON:  None. FINDINGS: Reverse shoulder arthroplasty is noted on the left in satisfactory position. Some degenerative changes of the acromioclavicular joint are noted. No acute abnormality is seen. IMPRESSION: Status post left shoulder arthroplasty. Electronically Signed   By: Inez Catalina M.D.   On: 01/07/2017 14:56    Assessment/Plan  Generalized weakness From deconditioning from recent shoulder surgery.Will have patient work with PT/OT as tolerated to regain strength and restore function.  Fall precautions are in place.  Left shoulder rotator cuff tear arthropathy status post left shoulder revision arthroplasty. Will need follow-up with orthopedic. Patient to wear her sling all the time. Will have her work with physical therapy and occupational therapy team to help with gait training and muscle strengthening exercises.fall precautions. Skin care. Encourage to be out of bed. PMR consult for pain management. Currently on Celebrex 200 mg daily and oxycodone immediate release 5 mg 1-2 tablet every 4 hours as needed for pain along with Tylenol Extra Strength 500 mg 1-2 tablet every 6 hours as  needed.  Slow transit constipation Currently on Senokot daily as needed. Change this to Senokot S daily for now. Change MiraLAX to 17 g twice daily for now and monitor. Maintain hydration. D/c colace  Leukocytosis Afebrile. Likely reactive leukocytosis. Monitor WBC count  Acute blood loss anemia Postop, monitor CBC  Hyponatremia Maintain hydration. Check BMP.  Chronic depression Stable mood. Continue Cymbalta and Lamictal current regimen, no changes made. Continue Klonopin on a needed basis for anxiety.  Hypertension  Currently on Hydrochlorothiazide 25 mg daily with potassium supplement. Check BMP and blood pressure. Continue daily aspirin  Hyperlipidemia Continue atorvastatin 10 mg daily   Goals of care: short term rehabilitation   Labs/tests ordered: CBC, BMP 01/18/2017  Family/ staff Communication: reviewed care plan with patient and nursing supervisor  I have spent greater than 50 minutes for this encounter which includes reviewing hospital records, addressing above mentioned concerns, reviewing care plan with patient, answering patient's concerns and counseling her.     Blanchie Serve, MD Internal Medicine [facility] [address] [place], [place] [address] Cell Phone (Monday-Friday 8 am - 5 pm): [phone] On Call: [phone] and follow prompts after 5 pm and on weekends Office Phone: [phone] Office Fax: [phone]

## 2017-01-13 DIAGNOSIS — F411 Generalized anxiety disorder: Secondary | ICD-10-CM | POA: Diagnosis not present

## 2017-01-13 DIAGNOSIS — I10 Essential (primary) hypertension: Secondary | ICD-10-CM | POA: Diagnosis not present

## 2017-01-13 DIAGNOSIS — E785 Hyperlipidemia, unspecified: Secondary | ICD-10-CM | POA: Diagnosis not present

## 2017-01-13 LAB — BASIC METABOLIC PANEL
BUN: 10 mg/dL (ref 4–21)
Creatinine: 0.6 mg/dL (ref 0.5–1.1)
GLUCOSE: 116 mg/dL
POTASSIUM: 4.4 mmol/L (ref 3.4–5.3)
SODIUM: 135 mmol/L — AB (ref 137–147)

## 2017-01-13 LAB — CBC AND DIFFERENTIAL
HEMATOCRIT: 39 % (ref 36–46)
Hemoglobin: 12.9 g/dL (ref 12.0–16.0)
Neutrophils Absolute: 7 /uL
Platelets: 349 10*3/uL (ref 150–399)
WBC: 11.3 10^3/mL

## 2017-01-17 DIAGNOSIS — E876 Hypokalemia: Secondary | ICD-10-CM | POA: Diagnosis not present

## 2017-01-17 DIAGNOSIS — K5901 Slow transit constipation: Secondary | ICD-10-CM | POA: Diagnosis not present

## 2017-01-17 DIAGNOSIS — F332 Major depressive disorder, recurrent severe without psychotic features: Secondary | ICD-10-CM | POA: Diagnosis not present

## 2017-01-17 DIAGNOSIS — E785 Hyperlipidemia, unspecified: Secondary | ICD-10-CM | POA: Diagnosis not present

## 2017-01-17 DIAGNOSIS — Z96612 Presence of left artificial shoulder joint: Secondary | ICD-10-CM | POA: Diagnosis not present

## 2017-01-17 DIAGNOSIS — J309 Allergic rhinitis, unspecified: Secondary | ICD-10-CM | POA: Diagnosis not present

## 2017-01-17 DIAGNOSIS — W19XXXA Unspecified fall, initial encounter: Secondary | ICD-10-CM | POA: Diagnosis not present

## 2017-01-17 DIAGNOSIS — M12812 Other specific arthropathies, not elsewhere classified, left shoulder: Secondary | ICD-10-CM | POA: Diagnosis not present

## 2017-01-17 DIAGNOSIS — F419 Anxiety disorder, unspecified: Secondary | ICD-10-CM | POA: Diagnosis not present

## 2017-01-17 DIAGNOSIS — R2689 Other abnormalities of gait and mobility: Secondary | ICD-10-CM | POA: Diagnosis not present

## 2017-01-17 DIAGNOSIS — M25512 Pain in left shoulder: Secondary | ICD-10-CM | POA: Diagnosis not present

## 2017-01-17 DIAGNOSIS — M19012 Primary osteoarthritis, left shoulder: Secondary | ICD-10-CM | POA: Diagnosis not present

## 2017-01-17 DIAGNOSIS — G4700 Insomnia, unspecified: Secondary | ICD-10-CM | POA: Diagnosis not present

## 2017-01-17 DIAGNOSIS — Z5189 Encounter for other specified aftercare: Secondary | ICD-10-CM | POA: Diagnosis not present

## 2017-01-17 DIAGNOSIS — M25562 Pain in left knee: Secondary | ICD-10-CM | POA: Diagnosis not present

## 2017-01-17 DIAGNOSIS — M797 Fibromyalgia: Secondary | ICD-10-CM | POA: Diagnosis not present

## 2017-01-17 DIAGNOSIS — R531 Weakness: Secondary | ICD-10-CM | POA: Diagnosis not present

## 2017-01-17 DIAGNOSIS — R278 Other lack of coordination: Secondary | ICD-10-CM | POA: Diagnosis not present

## 2017-01-17 DIAGNOSIS — Z471 Aftercare following joint replacement surgery: Secondary | ICD-10-CM | POA: Diagnosis not present

## 2017-01-17 DIAGNOSIS — I10 Essential (primary) hypertension: Secondary | ICD-10-CM | POA: Diagnosis not present

## 2017-01-19 DIAGNOSIS — Z5189 Encounter for other specified aftercare: Secondary | ICD-10-CM | POA: Diagnosis not present

## 2017-01-19 DIAGNOSIS — M797 Fibromyalgia: Secondary | ICD-10-CM | POA: Diagnosis not present

## 2017-01-19 DIAGNOSIS — M25562 Pain in left knee: Secondary | ICD-10-CM | POA: Diagnosis not present

## 2017-01-19 DIAGNOSIS — M25512 Pain in left shoulder: Secondary | ICD-10-CM | POA: Diagnosis not present

## 2017-01-23 DIAGNOSIS — W19XXXA Unspecified fall, initial encounter: Secondary | ICD-10-CM | POA: Diagnosis not present

## 2017-01-23 DIAGNOSIS — M25512 Pain in left shoulder: Secondary | ICD-10-CM | POA: Diagnosis not present

## 2017-01-25 DIAGNOSIS — M19012 Primary osteoarthritis, left shoulder: Secondary | ICD-10-CM | POA: Diagnosis not present

## 2017-01-25 LAB — CBC AND DIFFERENTIAL
HCT: 39 % (ref 36–46)
Hemoglobin: 12.8 g/dL (ref 12.0–16.0)
Platelets: 271 10*3/uL (ref 150–399)
WBC: 8.4 10^3/mL

## 2017-01-28 ENCOUNTER — Encounter: Payer: Self-pay | Admitting: Adult Health

## 2017-01-28 ENCOUNTER — Non-Acute Institutional Stay (SKILLED_NURSING_FACILITY): Payer: Medicare Other | Admitting: Adult Health

## 2017-01-28 DIAGNOSIS — I10 Essential (primary) hypertension: Secondary | ICD-10-CM

## 2017-01-28 DIAGNOSIS — J309 Allergic rhinitis, unspecified: Secondary | ICD-10-CM

## 2017-01-28 DIAGNOSIS — E876 Hypokalemia: Secondary | ICD-10-CM

## 2017-01-28 DIAGNOSIS — F419 Anxiety disorder, unspecified: Secondary | ICD-10-CM

## 2017-01-28 DIAGNOSIS — E785 Hyperlipidemia, unspecified: Secondary | ICD-10-CM

## 2017-01-28 DIAGNOSIS — F332 Major depressive disorder, recurrent severe without psychotic features: Secondary | ICD-10-CM

## 2017-01-28 DIAGNOSIS — R531 Weakness: Secondary | ICD-10-CM

## 2017-01-28 DIAGNOSIS — M12812 Other specific arthropathies, not elsewhere classified, left shoulder: Secondary | ICD-10-CM | POA: Diagnosis not present

## 2017-01-28 DIAGNOSIS — K5901 Slow transit constipation: Secondary | ICD-10-CM

## 2017-01-28 DIAGNOSIS — M75102 Unspecified rotator cuff tear or rupture of left shoulder, not specified as traumatic: Secondary | ICD-10-CM

## 2017-01-28 DIAGNOSIS — G4700 Insomnia, unspecified: Secondary | ICD-10-CM

## 2017-01-28 NOTE — Progress Notes (Signed)
DATE:  01/28/2017   MRN:  [id number]  BIRTHDAY: Sep 07, 1946  Facility:  [facility] Home Location:  [place] Room Number: 104-P  LEVEL OF CARE:  SNF (31)  Contact Information    Name Relation Home Work [place] Other [phone]  [phone]   Emeline Gins   [phone]   Dorthy Cooler Daughter [phone]     [place] Code [phone]         Code Status History    Date Active Date Inactive Code Status Order ID Comments User Context   01/07/2017  5:45 PM 01/11/2017 11:47 PM Full Code [id number]  Grier Mitts, PA-C Inpatient   10/22/2016 11:50 PM 10/25/2016  7:43 PM Full Code [id number]  Courteney Julio Alm, MD ED   12/13/2014 12:39 PM 12/15/2014  5:42 PM Full Code [id number]  Grier Mitts, PA-C Inpatient       Chief Complaint  Patient presents with  . Discharge Note    HISTORY OF PRESENT ILLNESS:  This is a 25-YO female who is for discharge home with home health PT, OT, CNA, and Nursing.    She has been admitted to [facility] on 01/11/17 from [facility] admission dates 01/07/17 to 01/10/17 with reverse total shoulder arthroplasty of left shoulder. She has PMH of hypertension, GERD, hearing impairment, osteoporosis and osteoarthritis.  Patient was admitted to this facility for short-term rehabilitation after the patient's recent hospitalization.  Patient has completed SNF rehabilitation and therapy has cleared the patient for discharge.   PAST MEDICAL HISTORY:  Past Medical History:  Diagnosis Date  . Acute blood loss anemia    hx  . Anxiety   . Arthritis   . Bipolar disorder ([place])   . Depression   . Dyspnea   . Fibromyalgia   . GERD (gastroesophageal reflux disease)   . Hearing impaired    has cochlear implant  . HTN (hypertension)   . Hypokalemia   . Meniere disease    takes HCTZ for her inner ear problem  . Mixed hyperlipidemia 09/27/2013  . Osteoporosis   .  Pneumonia 11/2016  . Primary localized osteoarthritis of right knee   . Slow transit constipation   . Unsteady gait      CURRENT MEDICATIONS: Reviewed  Patient's Medications  New Prescriptions   No medications on file  Previous Medications   ACETAMINOPHEN (TYLENOL) 500 MG TABLET    Take 1,000 mg by mouth every 6 (six) hours as needed for mild pain (for pain.).    ASPIRIN EC 81 MG TABLET    Take 81 mg by mouth daily.   ATORVASTATIN (LIPITOR) 10 MG TABLET    Take 10 mg by mouth at bedtime.    CALCIUM CARBONATE (TUMS - DOSED IN MG ELEMENTAL CALCIUM) 500 MG CHEWABLE TABLET    Chew 1 tablet by mouth 2 (two) times daily.   CELECOXIB (CELEBREX) 200 MG CAPSULE    Take 200 mg by mouth daily with breakfast.    CHOLECALCIFEROL (VITAMIN D) 1000 UNITS TABLET    Take 1,000 Units by mouth at bedtime.    CLONAZEPAM (KLONOPIN) 0.5 MG TABLET    Take 0.25-0.5 mg by mouth. Take 0.5 mg qhs, 0.25 mg BID PRN   DIPHENHYDRAMINE (BENADRYL) 12.5 MG CHEWABLE TABLET    Chew 12.5 mg by mouth at bedtime as needed for allergies. sublingual    DOXEPIN (SINEQUAN) 50 MG CAPSULE    Take 50 mg by mouth at bedtime as  needed (sleep/anxiety.).   DULOXETINE (CYMBALTA) 60 MG CAPSULE    Take 60 mg by mouth 2 (two) times daily.    FLUTICASONE (FLONASE) 50 MCG/ACT NASAL SPRAY    Place 1 spray into both nostrils daily as needed for allergies or rhinitis.   HYDROCHLOROTHIAZIDE (HYDRODIURIL) 25 MG TABLET    Take 25 mg by mouth Daily.    HYDROCODONE-HOMATROPINE (HYCODAN) 5-1.5 MG/5ML SYRUP    Take 5 mLs by mouth every 6 (six) hours as needed for cough.   HYDROCORTISONE CREAM 1 %    Apply 1 application topically 2 (two) times daily. Apply to area under left breast BID PRN for itching   KLOR-CON M20 20 MEQ TABLET    Take 20 mEq by mouth daily.    LAMOTRIGINE (LAMICTAL) 200 MG TABLET    Take 200 mg by mouth at bedtime.    MELATONIN 5 MG TABS    Take by mouth.   MONTELUKAST (SINGULAIR) 10 MG TABLET    Take 10 mg by mouth at bedtime.     OMEGA-3 FATTY ACIDS (FISH OIL) 1200 MG CAPS    Take 1,200 mg by mouth daily.    OVER THE COUNTER MEDICATION    Take 10 mg by mouth at bedtime. saphris    rx not over the counter   OXYCODONE (ROXICODONE) 5 MG IMMEDIATE RELEASE TABLET    Take 1-2 tablets (5-10 mg total) by mouth every 4 (four) hours as needed.   OXYMETAZOLINE (AFRIN) 0.05 % NASAL SPRAY    Place 1-2 sprays into both nostrils 2 (two) times daily as needed for congestion.   OXYMETAZOLINE (AFRIN) 0.05 % NASAL SPRAY    Place 1-2 sprays into both nostrils 2 (two) times daily.   POLYETHYLENE GLYCOL (MIRALAX / GLYCOLAX) PACKET    Take 17 g by mouth daily.    SENNOSIDES-DOCUSATE SODIUM (SENOKOT-S) 8.6-50 MG TABLET    Take 1 tablet by mouth daily as needed for constipation.    VITAMIN C (ASCORBIC ACID) 500 MG TABLET    Take 500 mg by mouth 2 (two) times daily.  Modified Medications   No medications on file  Discontinued Medications   CALCIUM CARBONATE (OS-CAL) 600 MG TABS TABLET    Take 600 mg by mouth 2 (two) times daily.    CLONAZEPAM (KLONOPIN) 0.25 MG DISINTEGRATING TABLET    Take 0.25 mg by mouth See admin instructions. Pt takes  0.5 mg at bedtime, and may take 0.25 mg twice daily as needed   DOCUSATE SODIUM (COLACE) 100 MG CAPSULE    Take 1 capsule (100 mg total) by mouth 3 (three) times daily as needed.   DOCUSATE SODIUM (COLACE) 50 MG CAPSULE    Take 50 mg by mouth daily as needed for mild constipation.   OVER THE COUNTER MEDICATION    melatoin 3mg  --3tabs pm qhs prn   TRAMADOL (ULTRAM) 50 MG TABLET    Take 50-100 mg by mouth every 4 (four) hours as needed.     Allergies  Allergen Reactions  . Latex Hives and Rash    Tape   "No problems with gloves"     REVIEW OF SYSTEMS:  GENERAL: no change in appetite, no fatigue, no weight changes, no fever, chills or weakness EYES: Denies change in vision, dry eyes, eye pain, itching or discharge EARS: Denies change in hearing, ringing in ears, or earache NOSE: Denies nasal congestion  or epistaxis MOUTH and THROAT: Denies oral discomfort, gingival pain or bleeding, pain from teeth or hoarseness  RESPIRATORY: no cough, SOB, DOE, wheezing, hemoptysis CARDIAC: no chest pain, edema or palpitations GI: no abdominal pain, diarrhea, constipation, heart burn, nausea or vomiting GU: Denies dysuria, frequency, hematuria, incontinence, or discharge PSYCHIATRIC: Denies feeling of depression or anxiety. No report of hallucinations, insomnia, paranoia, or agitation    PHYSICAL EXAMINATION  GENERAL APPEARANCE: Well nourished. In no acute distress. Normal body habitus SKIN:  Left shoulder surgical wound is healed HEAD: Normal in size and contour. No evidence of trauma EYES: Lids open and close normally. No blepharitis, entropion or ectropion. PERRL. Conjunctivae are clear and sclerae are white. Lenses are without opacity EARS: Pinnae are normal. Uses bilateral hearing aids MOUTH and THROAT: Lips are without lesions. Oral mucosa is moist and without lesions. Tongue is normal in shape, size, and color and without lesions NECK: supple, trachea midline, no neck masses, no thyroid tenderness, no thyromegaly LYMPHATICS: no LAN in the neck, no supraclavicular LAN RESPIRATORY: breathing is even & unlabored, BS CTAB CARDIAC: RRR, no murmur,no extra heart sounds, no edema GI: abdomen soft, normal BS, no masses, no tenderness, no hepatomegaly, no splenomegaly EXTREMITIES: Able to move X 4 extremities  PSYCHIATRIC: Alert and oriented X 3. Affect and behavior are appropriate    LABS/RADIOLOGY: Labs reviewed: Basic Metabolic Panel:  Recent Labs  10/22/16 1551 12/30/16 1457 01/08/17 0252 01/13/17  NA 134* 136 134* 135*  K 5.1 4.0 3.6 4.4  CL 102 104 96*  --   CO2 25 21* 28  --   GLUCOSE 90 304* 157*  --   BUN 15 11 13 10   CREATININE 0.91 0.71 0.72 0.6  CALCIUM 10.2 10.4* 8.8*  --    Liver Function Tests:  Recent Labs  12/30/16 1457  AST 41  ALT 35  ALKPHOS 110  BILITOT 0.4   PROT 7.5  ALBUMIN 4.3   CBC:  Recent Labs  10/22/16 1551 12/30/16 1457 01/08/17 0252 01/13/17 01/25/17  WBC 6.5 9.7 14.2* 11.3 8.4  NEUTROABS  --  7.4  --  7  --   HGB 13.8 15.5* 11.9* 12.9 12.8  HCT 39.6 46.8* 35.8* 39 39  MCV 93.8 92.9 93.2  --   --   PLT 211 222 258 349 271   CBG:  Recent Labs  01/07/17 0909 01/07/17 1316  GLUCAP 141* 123*      Dg Chest 2 View  Result Date: 12/30/2016 CLINICAL DATA:  Preoperative evaluation for left shoulder replacement EXAM: CHEST  2 VIEW COMPARISON:  December 02, 2016 FINDINGS: Lungs are clear. Heart size and pulmonary vascularity are normal. No adenopathy. There is thoracolumbar levoscoliosis. Patient has had total shoulder replacement on the right. There is advanced arthropathy in the left shoulder. IMPRESSION: No edema or consolidation. Stable cardiac silhouette. Advanced arthropathy left shoulder. Status post right shoulder replacement. Electronically Signed   By: Lowella Grip III M.D.   On: 12/30/2016 15:23   Dg Shoulder Left Port  Result Date: 01/07/2017 CLINICAL DATA:  Status post reverse shoulder arthroplasty EXAM: LEFT SHOULDER - 1 VIEW COMPARISON:  None. FINDINGS: Reverse shoulder arthroplasty is noted on the left in satisfactory position. Some degenerative changes of the acromioclavicular joint are noted. No acute abnormality is seen. IMPRESSION: Status post left shoulder arthroplasty. Electronically Signed   By: Inez Catalina M.D.   On: 01/07/2017 14:56    ASSESSMENT/PLAN:  Generalized weakness - for Home health PT and OT for therapeutic strengthening exercises; fall precautions  Left shoulder rotator cuff tear arthropathy - S/P left shoulder revision  arthroplasty, follow-up with orthopedic surgeon, for home health PT and OT for therapeutic strengthening exercises; continue oxycodone 5 mg 1-2 tabs by mouth every 4 hours when necessary and Tylenol extra strength 500 mg 2 complex by mouth every 6 hours when  necessary  Insomnia - continue melatonin 5 mg 1 tab by mouth every at bedtime  Anxiety - mood this is stable; continue Klonopin 0.5 mg 1 tab by mouth daily at bedtime and Klonopin 0.5 mg 1/2 tab = 0. By mouth twice a day when necessary25 mg   Chronic depression  with psychotic features - continue  Lamictal 200 mg 1 tab by mouth daily at bedtime and Cymbalta 60 mg 1 capsule by mouth twice a day and Doxepin 50 mg 1 capsule PO Q HS PRN  Allergic rhinitis - continue montelukast 10 mg 1 tab by mouth daily at bedtime and Flonase 50 g 1 spray into each nostril daily when necessary  Hyperlipidemia - continue atorvastatin 10 mg 1 tab by mouth daily at bedtime  Hypokalemia - decrease KCL ER from 20 meq to 10 meq 1 tab PO Q D Lab Results  Component Value Date   K 4.4 01/13/2017   Hypertension - well controlled; continue hydrochlorothiazide 25 mg 1 tab by mouth daily   Constipation - continue senna S  2 tabs by mouth daily at bedtime and MiraLAX 17 g by mouth daily at bedtime       I have filled out patient's discharge paperwork and written prescriptions.  Patient will receive home health PT, OT, Nursing and CNA.  DME provided:  None  Total discharge time: Greater than 30 minutes Greater than 50% was spent in counseling and coordination of care with the patient.   Discharge time involved coordination of the discharge process with social worker, nursing staff and therapy department. Medical justification for home health services verified.   Treyden Hakim C. [place] - NP    [employer] [phone]

## 2017-02-01 DIAGNOSIS — M797 Fibromyalgia: Secondary | ICD-10-CM | POA: Diagnosis not present

## 2017-02-01 DIAGNOSIS — F319 Bipolar disorder, unspecified: Secondary | ICD-10-CM | POA: Diagnosis not present

## 2017-02-01 DIAGNOSIS — Z96612 Presence of left artificial shoulder joint: Secondary | ICD-10-CM | POA: Diagnosis not present

## 2017-02-01 DIAGNOSIS — Z471 Aftercare following joint replacement surgery: Secondary | ICD-10-CM | POA: Diagnosis not present

## 2017-02-01 DIAGNOSIS — M1990 Unspecified osteoarthritis, unspecified site: Secondary | ICD-10-CM | POA: Diagnosis not present

## 2017-02-01 DIAGNOSIS — F411 Generalized anxiety disorder: Secondary | ICD-10-CM | POA: Diagnosis not present

## 2017-02-01 DIAGNOSIS — M810 Age-related osteoporosis without current pathological fracture: Secondary | ICD-10-CM | POA: Diagnosis not present

## 2017-02-01 DIAGNOSIS — H8109 Meniere's disease, unspecified ear: Secondary | ICD-10-CM | POA: Diagnosis not present

## 2017-02-10 DIAGNOSIS — H40013 Open angle with borderline findings, low risk, bilateral: Secondary | ICD-10-CM | POA: Diagnosis not present

## 2017-02-10 DIAGNOSIS — H903 Sensorineural hearing loss, bilateral: Secondary | ICD-10-CM | POA: Diagnosis not present

## 2017-02-12 DIAGNOSIS — D485 Neoplasm of uncertain behavior of skin: Secondary | ICD-10-CM | POA: Diagnosis not present

## 2017-02-12 DIAGNOSIS — L820 Inflamed seborrheic keratosis: Secondary | ICD-10-CM | POA: Diagnosis not present

## 2017-02-15 DIAGNOSIS — K219 Gastro-esophageal reflux disease without esophagitis: Secondary | ICD-10-CM | POA: Diagnosis not present

## 2017-02-15 DIAGNOSIS — E782 Mixed hyperlipidemia: Secondary | ICD-10-CM | POA: Diagnosis not present

## 2017-02-15 DIAGNOSIS — R739 Hyperglycemia, unspecified: Secondary | ICD-10-CM | POA: Diagnosis not present

## 2017-02-15 DIAGNOSIS — M1990 Unspecified osteoarthritis, unspecified site: Secondary | ICD-10-CM | POA: Diagnosis not present

## 2017-02-16 DIAGNOSIS — S0000XA Unspecified superficial injury of scalp, initial encounter: Secondary | ICD-10-CM | POA: Diagnosis not present

## 2017-02-16 DIAGNOSIS — H903 Sensorineural hearing loss, bilateral: Secondary | ICD-10-CM | POA: Diagnosis not present

## 2017-02-24 DIAGNOSIS — M19012 Primary osteoarthritis, left shoulder: Secondary | ICD-10-CM | POA: Diagnosis not present

## 2017-03-11 DIAGNOSIS — S0000XA Unspecified superficial injury of scalp, initial encounter: Secondary | ICD-10-CM | POA: Diagnosis not present

## 2017-03-11 DIAGNOSIS — H903 Sensorineural hearing loss, bilateral: Secondary | ICD-10-CM | POA: Diagnosis not present

## 2017-03-24 DIAGNOSIS — F3181 Bipolar II disorder: Secondary | ICD-10-CM | POA: Diagnosis not present

## 2017-03-26 DIAGNOSIS — M19012 Primary osteoarthritis, left shoulder: Secondary | ICD-10-CM | POA: Diagnosis not present

## 2017-03-29 DIAGNOSIS — M1712 Unilateral primary osteoarthritis, left knee: Secondary | ICD-10-CM | POA: Diagnosis not present

## 2017-04-14 ENCOUNTER — Other Ambulatory Visit: Payer: Self-pay | Admitting: Orthopedic Surgery

## 2017-04-14 DIAGNOSIS — S42102A Fracture of unspecified part of scapula, left shoulder, initial encounter for closed fracture: Secondary | ICD-10-CM

## 2017-04-14 DIAGNOSIS — M5137 Other intervertebral disc degeneration, lumbosacral region: Secondary | ICD-10-CM | POA: Diagnosis not present

## 2017-04-14 DIAGNOSIS — M5416 Radiculopathy, lumbar region: Secondary | ICD-10-CM | POA: Diagnosis not present

## 2017-04-14 DIAGNOSIS — Z96612 Presence of left artificial shoulder joint: Secondary | ICD-10-CM | POA: Diagnosis not present

## 2017-04-14 DIAGNOSIS — Z471 Aftercare following joint replacement surgery: Secondary | ICD-10-CM | POA: Diagnosis not present

## 2017-04-14 DIAGNOSIS — M25512 Pain in left shoulder: Secondary | ICD-10-CM | POA: Diagnosis not present

## 2017-04-15 ENCOUNTER — Other Ambulatory Visit: Payer: Self-pay | Admitting: Orthopedic Surgery

## 2017-04-15 DIAGNOSIS — S42102A Fracture of unspecified part of scapula, left shoulder, initial encounter for closed fracture: Secondary | ICD-10-CM

## 2017-04-19 ENCOUNTER — Ambulatory Visit
Admission: RE | Admit: 2017-04-19 | Discharge: 2017-04-19 | Disposition: A | Discharge status: Home / Self Care | Payer: Medicare Other | Source: Ambulatory Visit | Attending: Orthopedic Surgery | Admitting: Orthopedic Surgery

## 2017-04-19 DIAGNOSIS — S42102A Fracture of unspecified part of scapula, left shoulder, initial encounter for closed fracture: Secondary | ICD-10-CM | POA: Diagnosis not present

## 2017-04-30 DIAGNOSIS — Z96612 Presence of left artificial shoulder joint: Secondary | ICD-10-CM | POA: Diagnosis not present

## 2017-04-30 DIAGNOSIS — Z471 Aftercare following joint replacement surgery: Secondary | ICD-10-CM | POA: Diagnosis not present

## 2017-04-30 DIAGNOSIS — M25512 Pain in left shoulder: Secondary | ICD-10-CM | POA: Diagnosis not present

## 2017-05-05 DIAGNOSIS — L570 Actinic keratosis: Secondary | ICD-10-CM | POA: Diagnosis not present

## 2017-05-05 DIAGNOSIS — L821 Other seborrheic keratosis: Secondary | ICD-10-CM | POA: Diagnosis not present

## 2017-05-13 DIAGNOSIS — M1712 Unilateral primary osteoarthritis, left knee: Secondary | ICD-10-CM | POA: Diagnosis not present

## 2017-05-14 DIAGNOSIS — M1990 Unspecified osteoarthritis, unspecified site: Secondary | ICD-10-CM | POA: Diagnosis not present

## 2017-05-20 DIAGNOSIS — M1712 Unilateral primary osteoarthritis, left knee: Secondary | ICD-10-CM | POA: Diagnosis not present

## 2017-05-27 DIAGNOSIS — M1712 Unilateral primary osteoarthritis, left knee: Secondary | ICD-10-CM | POA: Diagnosis not present

## 2017-05-28 DIAGNOSIS — M533 Sacrococcygeal disorders, not elsewhere classified: Secondary | ICD-10-CM | POA: Diagnosis not present

## 2017-06-02 ENCOUNTER — Other Ambulatory Visit: Payer: Self-pay | Admitting: Orthopedic Surgery

## 2017-06-02 DIAGNOSIS — M533 Sacrococcygeal disorders, not elsewhere classified: Secondary | ICD-10-CM

## 2017-06-16 ENCOUNTER — Ambulatory Visit
Admission: RE | Admit: 2017-06-16 | Discharge: 2017-06-16 | Disposition: A | Discharge status: Home / Self Care | Payer: Medicare Other | Source: Ambulatory Visit | Attending: Orthopedic Surgery | Admitting: Orthopedic Surgery

## 2017-06-16 DIAGNOSIS — M533 Sacrococcygeal disorders, not elsewhere classified: Secondary | ICD-10-CM

## 2017-06-25 DIAGNOSIS — M25512 Pain in left shoulder: Secondary | ICD-10-CM | POA: Diagnosis not present

## 2017-06-25 DIAGNOSIS — Z471 Aftercare following joint replacement surgery: Secondary | ICD-10-CM | POA: Diagnosis not present

## 2017-06-25 DIAGNOSIS — Z96612 Presence of left artificial shoulder joint: Secondary | ICD-10-CM | POA: Diagnosis not present

## 2017-07-30 DIAGNOSIS — M545 Low back pain: Secondary | ICD-10-CM | POA: Diagnosis not present

## 2017-07-30 DIAGNOSIS — M533 Sacrococcygeal disorders, not elsewhere classified: Secondary | ICD-10-CM | POA: Diagnosis not present

## 2017-08-04 DIAGNOSIS — H8109 Meniere's disease, unspecified ear: Secondary | ICD-10-CM | POA: Diagnosis not present

## 2017-08-04 DIAGNOSIS — M1990 Unspecified osteoarthritis, unspecified site: Secondary | ICD-10-CM | POA: Diagnosis not present

## 2017-08-04 DIAGNOSIS — E782 Mixed hyperlipidemia: Secondary | ICD-10-CM | POA: Diagnosis not present

## 2017-08-04 DIAGNOSIS — R05 Cough: Secondary | ICD-10-CM | POA: Diagnosis not present

## 2017-08-09 DIAGNOSIS — F3181 Bipolar II disorder: Secondary | ICD-10-CM | POA: Diagnosis not present

## 2017-08-11 DIAGNOSIS — S0000XA Unspecified superficial injury of scalp, initial encounter: Secondary | ICD-10-CM | POA: Diagnosis not present

## 2017-08-11 DIAGNOSIS — H903 Sensorineural hearing loss, bilateral: Secondary | ICD-10-CM | POA: Diagnosis not present

## 2017-08-12 DIAGNOSIS — H00021 Hordeolum internum right upper eyelid: Secondary | ICD-10-CM | POA: Diagnosis not present

## 2017-09-06 ENCOUNTER — Encounter: Payer: Self-pay | Admitting: Gastroenterology

## 2017-09-16 DIAGNOSIS — H903 Sensorineural hearing loss, bilateral: Secondary | ICD-10-CM | POA: Diagnosis not present

## 2017-09-29 ENCOUNTER — Encounter: Payer: Self-pay | Admitting: Gastroenterology

## 2017-10-22 DIAGNOSIS — Z96612 Presence of left artificial shoulder joint: Secondary | ICD-10-CM | POA: Diagnosis not present

## 2017-10-22 DIAGNOSIS — Z471 Aftercare following joint replacement surgery: Secondary | ICD-10-CM | POA: Diagnosis not present

## 2017-10-22 DIAGNOSIS — Z96611 Presence of right artificial shoulder joint: Secondary | ICD-10-CM | POA: Diagnosis not present

## 2017-10-26 DIAGNOSIS — D485 Neoplasm of uncertain behavior of skin: Secondary | ICD-10-CM | POA: Diagnosis not present

## 2017-10-26 DIAGNOSIS — Z23 Encounter for immunization: Secondary | ICD-10-CM | POA: Diagnosis not present

## 2017-10-26 DIAGNOSIS — L821 Other seborrheic keratosis: Secondary | ICD-10-CM | POA: Diagnosis not present

## 2017-10-26 DIAGNOSIS — C4441 Basal cell carcinoma of skin of scalp and neck: Secondary | ICD-10-CM | POA: Diagnosis not present

## 2017-11-03 DIAGNOSIS — H8109 Meniere's disease, unspecified ear: Secondary | ICD-10-CM | POA: Diagnosis not present

## 2017-11-03 DIAGNOSIS — M1990 Unspecified osteoarthritis, unspecified site: Secondary | ICD-10-CM | POA: Diagnosis not present

## 2017-11-03 DIAGNOSIS — J309 Allergic rhinitis, unspecified: Secondary | ICD-10-CM | POA: Diagnosis not present

## 2017-11-03 DIAGNOSIS — E782 Mixed hyperlipidemia: Secondary | ICD-10-CM | POA: Diagnosis not present

## 2017-11-12 DIAGNOSIS — Z803 Family history of malignant neoplasm of breast: Secondary | ICD-10-CM | POA: Diagnosis not present

## 2017-11-12 DIAGNOSIS — Z1231 Encounter for screening mammogram for malignant neoplasm of breast: Secondary | ICD-10-CM | POA: Diagnosis not present

## 2017-11-16 ENCOUNTER — Ambulatory Visit (AMBULATORY_SURGERY_CENTER): Payer: Self-pay

## 2017-11-16 ENCOUNTER — Encounter: Payer: Self-pay | Admitting: Gastroenterology

## 2017-11-16 ENCOUNTER — Other Ambulatory Visit: Payer: Self-pay

## 2017-11-16 VITALS — Ht 61.0 in | Wt 167.2 lb

## 2017-11-16 DIAGNOSIS — Z86010 Personal history of colonic polyps: Secondary | ICD-10-CM

## 2017-11-16 NOTE — Progress Notes (Signed)
Denies allergies to eggs or soy products. Denies complication of anesthesia or sedation. Denies use of weight loss medication. Denies use of O2.   Emmi instructions declined.  

## 2017-11-24 DIAGNOSIS — C4441 Basal cell carcinoma of skin of scalp and neck: Secondary | ICD-10-CM | POA: Diagnosis not present

## 2017-11-30 ENCOUNTER — Ambulatory Visit (AMBULATORY_SURGERY_CENTER): Payer: Medicare Other | Admitting: Gastroenterology

## 2017-11-30 ENCOUNTER — Encounter: Payer: Self-pay | Admitting: Gastroenterology

## 2017-11-30 VITALS — BP 129/66 | HR 79 | Temp 99.3°F | Resp 19 | Ht 61.0 in | Wt 167.0 lb

## 2017-11-30 DIAGNOSIS — D127 Benign neoplasm of rectosigmoid junction: Secondary | ICD-10-CM

## 2017-11-30 DIAGNOSIS — D122 Benign neoplasm of ascending colon: Secondary | ICD-10-CM | POA: Diagnosis not present

## 2017-11-30 DIAGNOSIS — D126 Benign neoplasm of colon, unspecified: Secondary | ICD-10-CM | POA: Diagnosis not present

## 2017-11-30 DIAGNOSIS — Z1211 Encounter for screening for malignant neoplasm of colon: Secondary | ICD-10-CM | POA: Diagnosis not present

## 2017-11-30 DIAGNOSIS — Z860100 Personal history of colon polyps, unspecified: Secondary | ICD-10-CM

## 2017-11-30 DIAGNOSIS — K635 Polyp of colon: Secondary | ICD-10-CM

## 2017-11-30 MED ORDER — SODIUM CHLORIDE 0.9 % IV SOLN: 500.0000 mL | Freq: Once | INTRAVENOUS | Status: DC

## 2017-11-30 NOTE — Progress Notes (Signed)
To PACU, VSS. Report to RN.tb 

## 2017-11-30 NOTE — Progress Notes (Signed)
Called to room to assist during endoscopic procedure.  Patient ID and intended procedure confirmed with present staff. Received instructions for my participation in the procedure from the performing physician.  

## 2017-11-30 NOTE — Op Note (Signed)
[facility] Patient Name: Elizabeth Stone Procedure Date: 11/30/2017 10:06 AM MRN: [id number] Endoscopist: Elizabeth Lipps P. Socrates Cahoon MD, MD Age: 72 Referring MD:  Date of Birth: 06/17/1946 Gender: Female Account #: [id number] Procedure:                Colonoscopy Indications:              High risk colon cancer surveillance: Personal                            history of colonic polyps - sessile serrated polyp                            removed 2013 Medicines:                Monitored Anesthesia Care Procedure:                Pre-Anesthesia Assessment:                           - Prior to the procedure, a History and Physical                            was performed, and patient medications and                            allergies were reviewed. The patient's tolerance of                            previous anesthesia was also reviewed. The risks                            and benefits of the procedure and the sedation                            options and risks were discussed with the patient.                            All questions were answered, and informed consent                            was obtained. Prior Anticoagulants: The patient has                            taken no previous anticoagulant or antiplatelet                            agents. ASA Grade Assessment: III - A patient with                            severe systemic disease. After reviewing the risks                            and benefits, the patient was deemed in  satisfactory condition to undergo the procedure.                           After obtaining informed consent, the colonoscope                            was passed under direct vision. Throughout the                            procedure, the patient's blood pressure, pulse, and                            oxygen saturations were monitored continuously. The                            Colonoscope was introduced through the  anus and                            advanced to the the terminal ileum, with                            identification of the appendiceal orifice and IC                            valve. The colonoscopy was performed without                            difficulty. The patient tolerated the procedure                            well. The quality of the bowel preparation was                            adequate. The ileocecal valve, appendiceal orifice,                            and rectum were photographed. Scope In: 10:13:46 AM Scope Out: 10:32:40 AM Scope Withdrawal Time: 0 hours 14 minutes 56 seconds  Total Procedure Duration: 0 hours 18 minutes 54 seconds  Findings:                 The perianal and digital rectal examinations were                            normal.                           The terminal ileum appeared normal.                           A 7 mm polyp was found in the ascending colon. The                            polyp was flat and initially covered with mucous  cap. The polyp was removed with a cold snare.                            Resection and retrieval were complete.                           A 4 mm polyp was found in the recto-sigmoid colon.                            The polyp was sessile. The polyp was removed with a                            cold snare. Resection and retrieval were complete.                           The colon was tortuous. Time was taken to lavage                            the colon with sterile water to achieve adequate                            views, due to residual liquid stool.                           The exam was otherwise without abnormality on                            direct and retroflexion views. Complications:            No immediate complications. Estimated blood loss:                            Minimal. Estimated Blood Loss:     Estimated blood loss was minimal. Impression:               - The  examined portion of the ileum was normal.                           - One 7 mm polyp in the ascending colon, removed                            with a cold snare. Resected and retrieved.                           - One 4 mm polyp at the recto-sigmoid colon,                            removed with a cold snare. Resected and retrieved.                           - Tortuous colon.                           - The examination was otherwise normal on  direct                            and retroflexion views. Recommendation:           - Patient has a contact number available for                            emergencies. The signs and symptoms of potential                            delayed complications were discussed with the                            patient. Return to normal activities tomorrow.                            Written discharge instructions were provided to the                            patient.                           - Resume previous diet.                           - Continue present medications.                           - Await pathology results.                           - Repeat colonoscopy is recommended for                            surveillance. The colonoscopy date will be                            determined after pathology results from today's                            exam become available for review. Elizabeth Lipps P. Annina Piotrowski MD, MD 11/30/2017 10:37:05 AM This report has been signed electronically.

## 2017-11-30 NOTE — Patient Instructions (Signed)
YOU HAD AN ENDOSCOPIC PROCEDURE TODAY AT [facility]:   Refer to the procedure report that was given to you for any specific questions about what was found during the examination.  If the procedure report does not answer your questions, please call your gastroenterologist to clarify.  If you requested that your care partner not be given the details of your procedure findings, then the procedure report has been included in a sealed envelope for you to review at your convenience later.  YOU SHOULD EXPECT: Some feelings of bloating in the abdomen. Passage of more gas than usual.  Walking can help get rid of the air that was put into your GI tract during the procedure and reduce the bloating. If you had a lower endoscopy (such as a colonoscopy or flexible sigmoidoscopy) you may notice spotting of blood in your stool or on the toilet paper. If you underwent a bowel prep for your procedure, you may not have a normal bowel movement for a few days.  Please Note:  You might notice some irritation and congestion in your nose or some drainage.  This is from the oxygen used during your procedure.  There is no need for concern and it should clear up in a day or so.  SYMPTOMS TO REPORT IMMEDIATELY:   Following lower endoscopy (colonoscopy or flexible sigmoidoscopy):  Excessive amounts of blood in the stool  Significant tenderness or worsening of abdominal pains  Swelling of the abdomen that is new, acute  Fever of 100F or higher   For urgent or emergent issues, a gastroenterologist can be reached at any hour by calling [phone].  Please see handouts given to you on Polyps.   DIET:  We do recommend a small meal at first, but then you may proceed to your regular diet.  Drink plenty of fluids but you should avoid alcoholic beverages for 24 hours.  ACTIVITY:  You should plan to take it easy for the rest of today and you should NOT DRIVE or use heavy machinery until tomorrow (because of  the sedation medicines used during the test).    FOLLOW UP: Our staff will call the number listed on your records the next business day following your procedure to check on you and address any questions or concerns that you may have regarding the information given to you following your procedure. If we do not reach you, we will leave a message.  However, if you are feeling well and you are not experiencing any problems, there is no need to return our call.  We will assume that you have returned to your regular daily activities without incident.  If any biopsies were taken you will be contacted by phone or by letter within the next 1-3 weeks.  Please call us at [phone] if you have not heard about the biopsies in 3 weeks.    Thank you for letting us take care of your healthcare needs today.  SIGNATURES/CONFIDENTIALITY: You and/or your care partner have signed paperwork which will be entered into your electronic medical record.  These signatures attest to the fact that that the information above on your After Visit Summary has been reviewed and is understood.  Full responsibility of the confidentiality of this discharge information lies with you and/or your care-partner.  Thank you for letting us take care of your healthcare needs today.

## 2017-12-01 ENCOUNTER — Telehealth: Payer: Self-pay | Admitting: *Deleted

## 2017-12-01 NOTE — Telephone Encounter (Signed)
No answer on second follow up call.  Left message.

## 2017-12-01 NOTE — Telephone Encounter (Signed)
Left message for post procedure call back. Will attempt to call back later this afternoon. SM

## 2017-12-06 ENCOUNTER — Encounter: Payer: Self-pay | Admitting: Gastroenterology

## 2017-12-06 DIAGNOSIS — M1712 Unilateral primary osteoarthritis, left knee: Secondary | ICD-10-CM | POA: Diagnosis not present

## 2017-12-09 DIAGNOSIS — J069 Acute upper respiratory infection, unspecified: Secondary | ICD-10-CM | POA: Diagnosis not present

## 2017-12-13 DIAGNOSIS — M1712 Unilateral primary osteoarthritis, left knee: Secondary | ICD-10-CM | POA: Diagnosis not present

## 2017-12-16 ENCOUNTER — Telehealth: Payer: Self-pay | Admitting: Gastroenterology

## 2017-12-16 NOTE — Telephone Encounter (Signed)
Left message for patient that I am returning her call. I have also re-mailed the path letter.

## 2017-12-16 NOTE — Telephone Encounter (Signed)
Spoke to patient, explained path results and recommendations. I have also mailed letter to her again with results.

## 2017-12-20 DIAGNOSIS — M1712 Unilateral primary osteoarthritis, left knee: Secondary | ICD-10-CM | POA: Diagnosis not present

## 2017-12-24 DIAGNOSIS — M25512 Pain in left shoulder: Secondary | ICD-10-CM | POA: Diagnosis not present

## 2017-12-24 DIAGNOSIS — Z471 Aftercare following joint replacement surgery: Secondary | ICD-10-CM | POA: Diagnosis not present

## 2017-12-24 DIAGNOSIS — Z96612 Presence of left artificial shoulder joint: Secondary | ICD-10-CM | POA: Diagnosis not present

## 2018-01-04 ENCOUNTER — Other Ambulatory Visit: Payer: Self-pay | Admitting: Orthopedic Surgery

## 2018-01-04 DIAGNOSIS — M533 Sacrococcygeal disorders, not elsewhere classified: Secondary | ICD-10-CM

## 2018-01-17 ENCOUNTER — Ambulatory Visit
Admission: RE | Admit: 2018-01-17 | Discharge: 2018-01-17 | Disposition: A | Discharge status: Home / Self Care | Payer: Medicare Other | Source: Ambulatory Visit | Attending: Orthopedic Surgery | Admitting: Orthopedic Surgery

## 2018-01-17 DIAGNOSIS — M533 Sacrococcygeal disorders, not elsewhere classified: Secondary | ICD-10-CM

## 2018-01-17 MED ORDER — METHYLPREDNISOLONE ACETATE 40 MG/ML INJ SUSP (RADIOLOG: 120.0000 mg | Freq: Once | INTRAMUSCULAR | Status: DC

## 2018-01-21 DIAGNOSIS — M533 Sacrococcygeal disorders, not elsewhere classified: Secondary | ICD-10-CM | POA: Diagnosis not present

## 2018-02-18 ENCOUNTER — Ambulatory Visit (INDEPENDENT_AMBULATORY_CARE_PROVIDER_SITE_OTHER): Payer: Medicare Other

## 2018-02-18 ENCOUNTER — Encounter: Payer: Self-pay | Admitting: Podiatry

## 2018-02-18 ENCOUNTER — Ambulatory Visit: Payer: Medicare Other | Admitting: Podiatry

## 2018-02-18 VITALS — BP 139/71 | HR 87 | Resp 16

## 2018-02-18 DIAGNOSIS — M2042 Other hammer toe(s) (acquired), left foot: Secondary | ICD-10-CM

## 2018-02-18 DIAGNOSIS — L03031 Cellulitis of right toe: Secondary | ICD-10-CM

## 2018-02-18 DIAGNOSIS — L603 Nail dystrophy: Secondary | ICD-10-CM

## 2018-02-18 DIAGNOSIS — M2041 Other hammer toe(s) (acquired), right foot: Secondary | ICD-10-CM | POA: Diagnosis not present

## 2018-02-18 DIAGNOSIS — L02611 Cutaneous abscess of right foot: Secondary | ICD-10-CM

## 2018-02-18 DIAGNOSIS — L97511 Non-pressure chronic ulcer of other part of right foot limited to breakdown of skin: Secondary | ICD-10-CM

## 2018-02-18 MED ORDER — MUPIROCIN 2 % EX OINT: 1.0000 "application " | TOPICAL_OINTMENT | Freq: Two times a day (BID) | CUTANEOUS | 2 refills | Status: DC

## 2018-02-18 MED ORDER — CEPHALEXIN 500 MG PO CAPS: 500.0000 mg | ORAL_CAPSULE | Freq: Three times a day (TID) | ORAL | 0 refills | Status: DC

## 2018-02-20 NOTE — Progress Notes (Signed)
Subjective:   Patient ID: Elizabeth Stone, female   DOB: 72 y.o.   MRN: [id number]   HPI Elizabeth Stone presents the office today for concerns of her big toe and her second toe rubbing on both of her feet on the right side she has noticed a wound over the last 1.5 weeks has been red and swollen and tender.  It looks like a blister had previously rubbed in between the toes and is now infected.  She denies any drainage or pus and she is been using a small amount of Neosporin to the area daily.  She also thinks that her right big toenail, pointing the lateral aspect is ingrown.  Denies any redness or drainage or any swelling but she has some pain to the area.  She also states that she previously had surgery in her left second and third toes in 2014 she did well for some time with the toes of started a drift back over and she is asking if this can be weaned down as they are starting to cause irritation and pain.  She has no other concerns today.   Review of Systems  All other systems reviewed and are negative.  Past Medical History:  Diagnosis Date  . Acute blood loss anemia    hx  . Anxiety   . Arthritis   . Bipolar disorder ([place])   . Depression   . Dyspnea   . Fibromyalgia   . GERD (gastroesophageal reflux disease)   . Hearing impaired    has cochlear implant  . HTN (hypertension)   . Hypokalemia   . Meniere disease    takes HCTZ for her inner ear problem  . Mixed hyperlipidemia 09/27/2013  . Osteoporosis   . Pneumonia 11/2016  . Primary localized osteoarthritis of right knee   . Slow transit constipation   . Unsteady gait     Past Surgical History:  Procedure Laterality Date  . ABDOMINAL HYSTERECTOMY  1986  . bladder tack  2007  . COCHLEAR IMPLANT     2007  . COLONOSCOPY    . HAMMER TOE SURGERY  2014   left foot  . REVERSE SHOULDER ARTHROPLASTY Right 12/13/2014   dr Tamera Punt  . REVERSE SHOULDER ARTHROPLASTY Right 12/13/2014   Procedure: REVERSE SHOULDER ARTHROPLASTY rt;  Surgeon:  Nita Sells, MD;  Location: [place];  Service: Orthopedics;  Laterality: Right;  Right reverse total shoulder replacement  . REVERSE SHOULDER ARTHROPLASTY Left 01/07/2017   Procedure: REVERSE LEFT TOTAL SHOULDER ARTHROPLASTY;  Surgeon: Tania Ade, MD;  Location: [facility];  Service: Orthopedics;  Laterality: Left;  Left reveres total shoulder arthroplasty  . TOTAL KNEE ARTHROPLASTY Right 12/16/2015   Procedure: TOTAL KNEE ARTHROPLASTY;  Surgeon: Elsie Saas, MD;  Location: [facility];  Service: Orthopedics;  Laterality: Right;     Current Outpatient Medications:  .  acetaminophen (TYLENOL) 500 MG tablet, Take 1,000 mg by mouth every 6 (six) hours as needed for mild pain (for pain.). , Disp: , Rfl:  .  aspirin EC 81 MG tablet, Take 81 mg by mouth daily., Disp: , Rfl:  .  atorvastatin (LIPITOR) 10 MG tablet, Take 10 mg by mouth at bedtime. , Disp: , Rfl:  .  baclofen (LIORESAL) 10 MG tablet, Take 10 mg by mouth 2 (two) times daily., Disp: , Rfl:  .  calcium carbonate (TUMS - DOSED IN MG ELEMENTAL CALCIUM) 500 MG chewable tablet, Chew 1 tablet by mouth 2 (two) times daily., Disp: , Rfl:  .  cephALEXin (KEFLEX) 500 MG capsule, Take 1 capsule (500 mg total) by mouth 3 (three) times daily., Disp: 30 capsule, Rfl: 0 .  cholecalciferol (VITAMIN D) 1000 units tablet, Take 1,000 Units by mouth at bedtime. , Disp: , Rfl:  .  clonazePAM (KLONOPIN) 1 MG tablet, , Disp: , Rfl: 0 .  diclofenac (VOLTAREN) 75 MG EC tablet, Take 75 mg by mouth 2 (two) times daily., Disp: , Rfl:  .  diphenhydrAMINE (BENADRYL) 12.5 MG chewable tablet, Chew 12.5 mg by mouth at bedtime as needed for allergies. sublingual , Disp: , Rfl:  .  DULoxetine (CYMBALTA) 60 MG capsule, Take 60 mg by mouth 2 (two) times daily. , Disp: , Rfl:  .  hydrochlorothiazide (HYDRODIURIL) 25 MG tablet, Take 25 mg by mouth Daily. , Disp: , Rfl:  .  HYDROcodone-acetaminophen (NORCO) 10-325 MG tablet, , Disp: , Rfl: 0 .  HYDROcodone-homatropine  (HYCODAN) 5-1.5 MG/5ML syrup, Take 5 mLs by mouth every 6 (six) hours as needed for cough., Disp: , Rfl:  .  KLOR-CON M20 20 MEQ tablet, Take 20 mEq by mouth daily. , Disp: , Rfl:  .  lamoTRIgine (LAMICTAL) 200 MG tablet, Take 200 mg by mouth daily., Disp: , Rfl:  .  montelukast (SINGULAIR) 10 MG tablet, Take 10 mg by mouth at bedtime. , Disp: , Rfl:  .  mupirocin ointment (BACTROBAN) 2 %, Apply 1 application topically 2 (two) times daily., Disp: 30 g, Rfl: 2 .  Omega-3 Fatty Acids (FISH OIL) 1200 MG CAPS, Take 1,200 mg by mouth daily. , Disp: , Rfl:  .  OVER THE COUNTER MEDICATION, Take 10 mg by mouth at bedtime. saphris    rx not over the counter, Disp: , Rfl:  .  sennosides-docusate sodium (SENOKOT-S) 8.6-50 MG tablet, Take 1 tablet by mouth daily as needed for constipation. , Disp: , Rfl:  .  vitamin C (ASCORBIC ACID) 500 MG tablet, Take 500 mg by mouth 2 (two) times daily., Disp: , Rfl:   Current Facility-Administered Medications:  .  0.9 %  sodium chloride infusion, 500 mL, Intravenous, Once, Armbruster, Carlota Raspberry, MD  Allergies  Allergen Reactions  . Latex Hives and Rash    Tape   "No problems with gloves"    Social History   Socioeconomic History  . Marital status: Widowed    Spouse name: Not on file  . Number of children: Not on file  . Years of education: Not on file  . Highest education level: Not on file  Occupational History  . Not on file  Social Needs  . Financial resource strain: Not on file  . Food insecurity:    Worry: Not on file    Inability: Not on file  . Transportation needs:    Medical: Not on file    Non-medical: Not on file  Tobacco Use  . Smoking status: Former Smoker    Packs/day: 0.25    Years: 23.00    Pack years: 5.75    Last attempt to quit: 08/25/1993    Years since quitting: 24.5  . Smokeless tobacco: Never Used  Substance and Sexual Activity  . Alcohol use: Yes    Alcohol/week: 6.0 oz    Types: 10 Shots of liquor per week    Comment:  none since christmas  . Drug use: No  . Sexual activity: Not on file  Lifestyle  . Physical activity:    Days per week: Not on file    Minutes per session: Not on  file  . Stress: Not on file  Relationships  . Social connections:    Talks on phone: Not on file    Gets together: Not on file    Attends religious service: Not on file    Active member of club or organization: Not on file    Attends meetings of clubs or organizations: Not on file    Relationship status: Not on file  . Intimate partner violence:    Fear of current or ex partner: Not on file    Emotionally abused: Not on file    Physically abused: Not on file    Forced sexual activity: Not on file  Other Topics Concern  . Not on file  Social History Narrative  . Not on file         Objective:  Physical Exam  General: AAO x3, NAD  Dermatological: Along the right foot in between the second toe as well as the hallux there are associated small superficial wounds present.  It appears that there was an old blister which has been D removed and there is a granular wound present and there is surrounding erythema along both the lateral hallux as well as medial second toe.  There is no drainage or pus or any fluctuation or crepitation.  There is no ascending cellulitis but there is erythema along the toes.  The nail the right hallux toenail appears to be hypertrophic, dystrophic, discolored however there is no redness or drainage or any swelling to the toenail site itself.  No other open lesions or pre-ulcerative lesions.  Vascular: Dorsalis Pedis artery and Posterior Tibial artery pedal pulses are 2/4 bilateral with immedate capillary fill time. Pedal hair growth present. No varicosities and no lower extremity edema present bilateral. There is no pain with calf compression, swelling, warmth, erythema.   Neruologic: Grossly intact via light touch bilateral.Protective threshold with Semmes Wienstein monofilament intact to all pedal  sites bilateral.   Musculoskeletal: On the right side there is a hallux abductus as well as medial deviation of the second toe causing irritation.  On the left side scars from prior surgery well-healed at the second and third toes are drifting medially deviated position but is reducible. Muscular strength 5/5 in all groups tested bilateral.  Gait: Unassisted, Nonantalgic.       Assessment:   Localized cellulitis right second toe and hallux, onychodystrophy right hallux toenail, digital deformity bilaterally    Plan:  -Treatment options discussed including all alternatives, risks, and complications -Etiology of symptoms were discussed -X-rays were obtained and reviewed with the patient.  Digital deformities are present and there is evidence of prior surgery on the left second third toes.  On the right side he can see with the hallux and second toe are abutting but there is no definitive cortical destruction suggestive of osteomyelitis. -In regards to the right foot prescribed Keflex.  Recommended mupirocin ointment dressing changes daily.  Discussed putting a dressing on the area daily.  Also surgical shoe was dispensed to help take pressure off of the area. -I debrided the right hallux toenails without any complications or bleeding.  In the future maintenance the toenail removed however given the wound and infection on the right foot and ankle on that for now. -She wanted to discuss surgery for the left foot we discussed shortening osteotomy of the left third metatarsal as well as pinning of the MPJs and release.  However we can consider doing this in the future would only get the infection  on the right foot  Resolved -Monitor for any clinical signs or symptoms of infection and directed to call the office immediately should any occur or go to the ER.  Trula Slade DPM

## 2018-02-21 DIAGNOSIS — H25013 Cortical age-related cataract, bilateral: Secondary | ICD-10-CM | POA: Diagnosis not present

## 2018-02-21 DIAGNOSIS — H35033 Hypertensive retinopathy, bilateral: Secondary | ICD-10-CM | POA: Diagnosis not present

## 2018-02-21 DIAGNOSIS — H40013 Open angle with borderline findings, low risk, bilateral: Secondary | ICD-10-CM | POA: Diagnosis not present

## 2018-02-21 DIAGNOSIS — H2513 Age-related nuclear cataract, bilateral: Secondary | ICD-10-CM | POA: Diagnosis not present

## 2018-02-22 DIAGNOSIS — R7303 Prediabetes: Secondary | ICD-10-CM | POA: Diagnosis not present

## 2018-02-22 DIAGNOSIS — M1990 Unspecified osteoarthritis, unspecified site: Secondary | ICD-10-CM | POA: Diagnosis not present

## 2018-02-25 ENCOUNTER — Ambulatory Visit: Payer: Medicare Other | Admitting: Podiatry

## 2018-02-25 ENCOUNTER — Encounter: Payer: Self-pay | Admitting: Podiatry

## 2018-02-25 VITALS — Temp 99.1°F

## 2018-02-25 DIAGNOSIS — L97511 Non-pressure chronic ulcer of other part of right foot limited to breakdown of skin: Secondary | ICD-10-CM

## 2018-02-25 DIAGNOSIS — L03031 Cellulitis of right toe: Secondary | ICD-10-CM | POA: Diagnosis not present

## 2018-02-25 DIAGNOSIS — M2041 Other hammer toe(s) (acquired), right foot: Secondary | ICD-10-CM

## 2018-02-25 DIAGNOSIS — M2042 Other hammer toe(s) (acquired), left foot: Secondary | ICD-10-CM

## 2018-02-25 DIAGNOSIS — L02611 Cutaneous abscess of right foot: Secondary | ICD-10-CM

## 2018-02-27 NOTE — Progress Notes (Signed)
Subjective: 72 year old female presents the office today for follow-up evaluation of localized infection, wounds to her right big toe and second toe.  She states that she has difficulty putting a bandage on her foot but she does keep antibiotic ointment on the area daily.  She is also been putting her toe separator to help keep the pressure off.  She states that she does not think the toes are getting better however she does state that she cannot really see it so she is not sure how it is doing.  She has not had any pus and she states that the drainage has decreased substantially compared to what it was. Denies any systemic complaints such as fevers, chills, nausea, vomiting. No acute changes since last appointment, and no other complaints at this time.   Objective: AAO x3, NAD DP/PT pulses palpable bilaterally, CRT less than 3 seconds Along the lateral aspect the second digit continues to be an ulceration with surrounding erythema but this appears to be improving.  Also the wound on the  lateral hallux appears to be almost healed and there is no significant erythema to the hallux today.  There is no drainage or pus identified today.  There is no ascending cellulitis.  There is no fluctuation or crepitation or any malodor. No open lesions or pre-ulcerative lesions.  The hallux and second digit are rubbing due to digital deformity and appears to be rigid. No pain with calf compression, swelling, warmth, erythema  Assessment: Healing wounds, infection right foot  Plan: -All treatment options discussed with the patient including all alternatives, risks, complications.  -I actually think that the wounds are getting better.  The  hallux is almost resolved in the right second toe is getting better.  Only to continue with antibiotic ointment dressing changes daily as well as offloading.  Dispense.  Offloading pads to help take pressure off the area.  I try to give her something different including tube foam  to put over the toe to help take pressure off the toes that she has difficulty putting bandages on her feet. -Finished course of antibiotics -Monitor for any clinical signs or symptoms of infection and directed to call the office immediately should any occur or go to the ER. -Patient encouraged to call the office with any questions, concerns, change in symptoms.   Trula Slade DPM

## 2018-03-08 ENCOUNTER — Ambulatory Visit (INDEPENDENT_AMBULATORY_CARE_PROVIDER_SITE_OTHER): Payer: Medicare Other | Admitting: Podiatry

## 2018-03-08 ENCOUNTER — Encounter: Payer: Self-pay | Admitting: Podiatry

## 2018-03-08 VITALS — Temp 99.5°F

## 2018-03-08 DIAGNOSIS — L97511 Non-pressure chronic ulcer of other part of right foot limited to breakdown of skin: Secondary | ICD-10-CM | POA: Diagnosis not present

## 2018-03-10 NOTE — Progress Notes (Signed)
Subjective: 72 year old female presents the office today for follow-up evaluation of localized infection, wounds to her right big toe and second toe.  She has been keeping a separator in between her hallux and second toe on the right foot.  She has been given antibiotic ointment on the area as well.  She states that the area has gotten better.  Denies any drainage or pus or any increase in swelling to her foot denies any red streaks.  No other concerns. Denies any systemic complaints such as fevers, chills, nausea, vomiting. No acute changes since last appointment, and no other complaints at this time.   Objective: AAO x3, NAD DP/PT pulses palpable bilaterally, CRT less than 3 seconds Along the lateral aspect the second digit it appears that the wound is almost completely healed at this time and there is minimal edema and erythema to the area is much improved.  There is no drainage or pus there is no ascending sialitis.  No fluctuation or crepitation.  Very small superficial abrasion type granular wound present to the lateral hallux IPJ.  There is no significant erythema there is no edema.  No ascending sialitis.  No fluctuation or crepitation or any drainage. No other open lesions. No pain with calf compression, swelling, warmth, erythema  Assessment: Healing wounds, resolving infection right foot  Plan: -All treatment options discussed with the patient including all alternatives, risks, complications.  -At this time she is doing well.  She is remained in the surgical shoe.  I want her to continue the toe support as well as antibiotic ointment to the area daily.  She can slowly start to transition to a regular shoe as she is able but there is any worsening or any changes she is to return to the surgical shoe and let me know.  I will see her back in a couple weeks.  We discussed surgery to help take pressure off the area and she will likely do this in the fall. Monitor for any clinical signs or  symptoms of infection and directed to call the office immediately should any occur or go to the ER.  Trula Slade DPM

## 2018-03-11 DIAGNOSIS — M545 Low back pain: Secondary | ICD-10-CM | POA: Diagnosis not present

## 2018-03-15 ENCOUNTER — Other Ambulatory Visit: Payer: Self-pay | Admitting: Orthopedic Surgery

## 2018-03-15 DIAGNOSIS — F3181 Bipolar II disorder: Secondary | ICD-10-CM | POA: Diagnosis not present

## 2018-03-15 DIAGNOSIS — G8929 Other chronic pain: Secondary | ICD-10-CM

## 2018-03-15 DIAGNOSIS — M545 Low back pain: Principal | ICD-10-CM

## 2018-03-21 ENCOUNTER — Telehealth: Payer: Self-pay | Admitting: Nurse Practitioner

## 2018-03-21 NOTE — Telephone Encounter (Signed)
Phone call to patient to review medication list and allergies for myelogram procedure. Pt informed to stop cymbalta and wellbutrin 48hrs prior to myelogram procedure. Pt verbalized understanding.

## 2018-03-24 ENCOUNTER — Ambulatory Visit: Payer: Medicare Other | Admitting: Podiatry

## 2018-03-25 ENCOUNTER — Ambulatory Visit: Payer: Medicare Other | Admitting: Podiatry

## 2018-03-29 ENCOUNTER — Ambulatory Visit
Admission: RE | Admit: 2018-03-29 | Discharge: 2018-03-29 | Disposition: A | Discharge status: Home / Self Care | Payer: Medicare Other | Source: Ambulatory Visit | Attending: Orthopedic Surgery | Admitting: Orthopedic Surgery

## 2018-03-29 DIAGNOSIS — G8929 Other chronic pain: Secondary | ICD-10-CM

## 2018-03-29 DIAGNOSIS — M48061 Spinal stenosis, lumbar region without neurogenic claudication: Secondary | ICD-10-CM | POA: Diagnosis not present

## 2018-03-29 DIAGNOSIS — M545 Low back pain: Principal | ICD-10-CM

## 2018-03-29 MED ORDER — IOPAMIDOL (ISOVUE-M 200) INJECTION 41%
20.0000 mL | Freq: Once | INTRAMUSCULAR | Status: AC
  Administered 2018-03-29: 20 mL via INTRATHECAL

## 2018-03-29 MED ORDER — DIAZEPAM 5 MG PO TABS
5.0000 mg | ORAL_TABLET | Freq: Once | ORAL | Status: AC
  Administered 2018-03-29: 5 mg via ORAL

## 2018-03-29 NOTE — Discharge Instructions (Addendum)
Myelogram Discharge Instructions  1. Go home and rest quietly for the next 24 hours.  It is important to lie flat for the next 24 hours.  Get up only to go to the restroom.  You may lie in the bed or on a couch on your back, your stomach, your left side or your right side.  You may have one pillow under your head.  You may have pillows between your knees while you are on your side or under your knees while you are on your back.  2. DO NOT drive today.  Recline the seat as far back as it will go, while still wearing your seat belt, on the way home.  3. You may get up to go to the bathroom as needed.  You may sit up for 10 minutes to eat.  You may resume your normal diet and medications unless otherwise indicated.  Drink lots of extra fluids today and tomorrow.  4. The incidence of headache, nausea, or vomiting is about 5% (one in 20 patients).  If you develop a headache, lie flat and drink plenty of fluids until the headache goes away.  Caffeinated beverages may be helpful.  If you develop severe nausea and vomiting or a headache that does not go away with flat bed rest, call [phone].  5. You may resume normal activities after your 24 hours of bed rest is over; however, do not exert yourself strongly or do any heavy lifting tomorrow. If when you get up you have a headache when standing, go back to bed and force fluids for another 24 hours.  6. Call your physician for a follow-up appointment.  The results of your myelogram will be sent directly to your physician by the following day.  7. If you have any questions or if complications develop after you arrive home, please call [phone].  Discharge instructions have been explained to the patient.  The patient, or the person responsible for the patient, fully understands these instructions.   YOU MAY RESTART YOUR ALKA SELTZER TODAY.    YOU MAY RESTART YOUR CYMBALTA AND WELLBUTRIN TOMORROW 03/30/2018 AT 9:00 AM.   YOU MAY TAKE  CLONAZEPAM/KLONIPIN TODAY, AS WELL AS THE REST OF YOUR MEDICATIONS (except for the Cymbalta and Wellbutrin).

## 2018-04-11 DIAGNOSIS — M5416 Radiculopathy, lumbar region: Secondary | ICD-10-CM | POA: Diagnosis not present

## 2018-04-20 DIAGNOSIS — M1990 Unspecified osteoarthritis, unspecified site: Secondary | ICD-10-CM | POA: Diagnosis not present

## 2018-04-20 DIAGNOSIS — H8109 Meniere's disease, unspecified ear: Secondary | ICD-10-CM | POA: Diagnosis not present

## 2018-04-20 DIAGNOSIS — E782 Mixed hyperlipidemia: Secondary | ICD-10-CM | POA: Diagnosis not present

## 2018-04-20 DIAGNOSIS — R635 Abnormal weight gain: Secondary | ICD-10-CM | POA: Diagnosis not present

## 2018-05-04 DIAGNOSIS — M48061 Spinal stenosis, lumbar region without neurogenic claudication: Secondary | ICD-10-CM | POA: Diagnosis not present

## 2018-05-04 DIAGNOSIS — M5137 Other intervertebral disc degeneration, lumbosacral region: Secondary | ICD-10-CM | POA: Diagnosis not present

## 2018-05-04 DIAGNOSIS — M47816 Spondylosis without myelopathy or radiculopathy, lumbar region: Secondary | ICD-10-CM | POA: Diagnosis not present

## 2018-05-05 DIAGNOSIS — H903 Sensorineural hearing loss, bilateral: Secondary | ICD-10-CM | POA: Diagnosis not present

## 2018-05-25 DIAGNOSIS — M48061 Spinal stenosis, lumbar region without neurogenic claudication: Secondary | ICD-10-CM | POA: Diagnosis not present

## 2018-05-25 DIAGNOSIS — M5137 Other intervertebral disc degeneration, lumbosacral region: Secondary | ICD-10-CM | POA: Diagnosis not present

## 2018-05-30 DIAGNOSIS — R05 Cough: Secondary | ICD-10-CM | POA: Diagnosis not present

## 2018-05-30 DIAGNOSIS — J029 Acute pharyngitis, unspecified: Secondary | ICD-10-CM | POA: Diagnosis not present

## 2018-05-30 DIAGNOSIS — J069 Acute upper respiratory infection, unspecified: Secondary | ICD-10-CM | POA: Diagnosis not present

## 2018-06-30 DIAGNOSIS — M1712 Unilateral primary osteoarthritis, left knee: Secondary | ICD-10-CM | POA: Diagnosis not present

## 2018-07-07 DIAGNOSIS — M1712 Unilateral primary osteoarthritis, left knee: Secondary | ICD-10-CM | POA: Diagnosis not present

## 2018-07-13 DIAGNOSIS — M1712 Unilateral primary osteoarthritis, left knee: Secondary | ICD-10-CM | POA: Diagnosis not present

## 2018-07-14 DIAGNOSIS — M47816 Spondylosis without myelopathy or radiculopathy, lumbar region: Secondary | ICD-10-CM | POA: Diagnosis not present

## 2018-07-14 DIAGNOSIS — M48061 Spinal stenosis, lumbar region without neurogenic claudication: Secondary | ICD-10-CM | POA: Diagnosis not present

## 2018-07-14 DIAGNOSIS — M5137 Other intervertebral disc degeneration, lumbosacral region: Secondary | ICD-10-CM | POA: Diagnosis not present

## 2018-07-20 DIAGNOSIS — R0600 Dyspnea, unspecified: Secondary | ICD-10-CM | POA: Diagnosis not present

## 2018-07-20 DIAGNOSIS — M1990 Unspecified osteoarthritis, unspecified site: Secondary | ICD-10-CM | POA: Diagnosis not present

## 2018-08-02 DIAGNOSIS — F3181 Bipolar II disorder: Secondary | ICD-10-CM | POA: Diagnosis not present

## 2018-08-11 ENCOUNTER — Other Ambulatory Visit (HOSPITAL_BASED_OUTPATIENT_CLINIC_OR_DEPARTMENT_OTHER): Payer: Self-pay | Admitting: Family Medicine

## 2018-08-11 DIAGNOSIS — R0600 Dyspnea, unspecified: Secondary | ICD-10-CM | POA: Diagnosis not present

## 2018-08-11 DIAGNOSIS — R0602 Shortness of breath: Secondary | ICD-10-CM

## 2018-08-12 ENCOUNTER — Emergency Department (HOSPITAL_COMMUNITY): Payer: Medicare Other

## 2018-08-12 ENCOUNTER — Encounter (HOSPITAL_COMMUNITY): Payer: Self-pay | Admitting: Emergency Medicine

## 2018-08-12 ENCOUNTER — Emergency Department (HOSPITAL_COMMUNITY)
Admission: EM | Admit: 2018-08-12 | Discharge: 2018-08-13 | Disposition: A | Discharge status: Home / Self Care | Payer: Medicare Other | Attending: Emergency Medicine | Admitting: Emergency Medicine

## 2018-08-12 DIAGNOSIS — R0602 Shortness of breath: Secondary | ICD-10-CM | POA: Diagnosis not present

## 2018-08-12 DIAGNOSIS — Z87891 Personal history of nicotine dependence: Secondary | ICD-10-CM | POA: Diagnosis not present

## 2018-08-12 DIAGNOSIS — Z79899 Other long term (current) drug therapy: Secondary | ICD-10-CM | POA: Diagnosis not present

## 2018-08-12 DIAGNOSIS — Z96651 Presence of right artificial knee joint: Secondary | ICD-10-CM | POA: Diagnosis not present

## 2018-08-12 DIAGNOSIS — I10 Essential (primary) hypertension: Secondary | ICD-10-CM | POA: Diagnosis not present

## 2018-08-12 DIAGNOSIS — Z7982 Long term (current) use of aspirin: Secondary | ICD-10-CM | POA: Diagnosis not present

## 2018-08-12 DIAGNOSIS — M7989 Other specified soft tissue disorders: Secondary | ICD-10-CM | POA: Diagnosis not present

## 2018-08-12 LAB — BASIC METABOLIC PANEL
Anion gap: 9 (ref 5–15)
BUN: 10 mg/dL (ref 8–23)
CHLORIDE: 97 mmol/L — AB (ref 98–111)
CO2: 24 mmol/L (ref 22–32)
CREATININE: 0.82 mg/dL (ref 0.44–1.00)
Calcium: 9.5 mg/dL (ref 8.9–10.3)
GFR calc non Af Amer: >60 mL/min (ref >60)
GLUCOSE: 116 mg/dL — AB (ref 70–99)
Potassium: 3.8 mmol/L (ref 3.5–5.1)
Sodium: 130 mmol/L — ABNORMAL LOW (ref 135–145)

## 2018-08-12 LAB — CBC
HEMATOCRIT: 43.3 % (ref 36.0–46.0)
HEMOGLOBIN: 14.3 g/dL (ref 12.0–15.0)
MCH: 32.9 pg (ref 26.0–34.0)
MCHC: 33 g/dL (ref 30.0–36.0)
MCV: 99.5 fL (ref 80.0–100.0)
Platelets: 208 10*3/uL (ref 150–400)
RBC: 4.35 MIL/uL (ref 3.87–5.11)
RDW: 12.8 % (ref 11.5–15.5)
WBC: 7 10*3/uL (ref 4.0–10.5)
nRBC: 0 % (ref 0.0–0.2)

## 2018-08-12 MED ORDER — IOPAMIDOL (ISOVUE-370) INJECTION 76%
100.0000 mL | Freq: Once | INTRAVENOUS | Status: AC | PRN
  Administered 2018-08-12: 100 mL via INTRAVENOUS

## 2018-08-12 MED ORDER — IOPAMIDOL (ISOVUE-370) INJECTION 76%
INTRAVENOUS | Status: AC
  Filled 2018-08-12: qty 100

## 2018-08-12 NOTE — ED Provider Notes (Signed)
Patient placed in Quick Look pathway, seen and evaluated   Chief Complaint: abnormal blood work, shortness of breath  HPI:  Elizabeth Stone is a 72 y.o. female who presents to the ED with shortness of breath and bilateral leg pain. Patient reports she got a call from her doctor stating she had blood work done that showed she had a blood clot.   ROS: Resp: shortness of breath  M/S: bilateral calf pain  Physical Exam:  BP (!) 157/81   Pulse 87   Temp 98.9 F (37.2 C) (Oral)   Resp 18   LMP  (LMP Unknown)   SpO2 97%    Gen: No distress  Neuro: Awake and Alert  Skin: Warm and dry  M/S: bilateral calf tenderness, right calf with swelling  Resp: no distress O2 SAT 97%   Initiation of care has begun. The patient has been counseled on the process, plan, and necessity for staying for the completion/evaluation, and the remainder of the medical screening examination    Ashley Murrain, NP 08/12/18 1734    Virgel Manifold, MD 08/13/18 1719

## 2018-08-12 NOTE — ED Triage Notes (Addendum)
Pt presents to ED for assessment after having a "positive test for blood clots".  Patient states the test was a blood test.  States she is supposed to have a scan Monday for this abnormal lab.  C/o SOB x 6 months, worsening the past month.  C/o bilateral leg pain and weakness, but worst in the left.

## 2018-08-12 NOTE — ED Notes (Signed)
Results reviewed.  No changes in acuity at this time 

## 2018-08-13 ENCOUNTER — Ambulatory Visit (HOSPITAL_BASED_OUTPATIENT_CLINIC_OR_DEPARTMENT_OTHER)
Admission: RE | Admit: 2018-08-13 | Discharge: 2018-08-13 | Disposition: A | Discharge status: Home / Self Care | Payer: Medicare Other | Source: Ambulatory Visit | Attending: Emergency Medicine | Admitting: Emergency Medicine

## 2018-08-13 DIAGNOSIS — R0602 Shortness of breath: Secondary | ICD-10-CM

## 2018-08-13 DIAGNOSIS — R52 Pain, unspecified: Secondary | ICD-10-CM | POA: Diagnosis not present

## 2018-08-13 LAB — BRAIN NATRIURETIC PEPTIDE: B Natriuretic Peptide: 16.3 pg/mL (ref 0.0–100.0)

## 2018-08-13 LAB — I-STAT TROPONIN, ED: TROPONIN I, POC: 0 ng/mL (ref 0.00–0.08)

## 2018-08-13 MED ORDER — LORAZEPAM 2 MG/ML IJ SOLN
0.5000 mg | Freq: Once | INTRAMUSCULAR | Status: AC
Start: 2018-08-13 — End: 2018-08-13
  Administered 2018-08-13: 0.5 mg via INTRAVENOUS
  Filled 2018-08-13: qty 1

## 2018-08-13 NOTE — ED Notes (Signed)
Ambulated the patient in the hallway, patient maintained SpO2 of 97% while ambulating on RA. No SOB, NAD. Patient back in bed.

## 2018-08-13 NOTE — ED Provider Notes (Signed)
[facility] EMERGENCY DEPARTMENT Provider Note   CSN: [id number] Arrival date & time: 08/12/18  1623     History   Chief Complaint Chief Complaint  Patient presents with  . Abnormal Lab    HPI Elizabeth Stone is a 72 y.o. female.  Patient is a 72 year old female with a history of anxiety, hypertension, hyperlipidemia and fibromyalgia as well as bipolar disorder who presents with shortness of breath.  She states that she has had some shortness of breath since she was treated for pneumonia in August.  Over the last couple weeks has progressively gotten worse where she can only walk short distances without getting short of breath.  She has a cough but mostly dry.  She denies any significant chest pain.  She states occasionally she has some sharp pins feeling to the left side of her chest but only last a few seconds and goes away.  She reports that her legs have been mildly swollen.  She has some soreness in her calves.  No fevers.  No nausea or vomiting.  No abdominal pain.  She has been seen by her PCP who did some blood work.  She was called today and told that her test for blood clot was elevated and that she need to come the emergency room for further evaluation.     Past Medical History:  Diagnosis Date  . Acute blood loss anemia    hx  . Anxiety   . Arthritis   . Bipolar disorder ([place])   . Depression   . Dyspnea   . Fibromyalgia   . GERD (gastroesophageal reflux disease)   . Hearing impaired    has cochlear implant  . HTN (hypertension)   . Hypokalemia   . Meniere disease    takes HCTZ for her inner ear problem  . Mixed hyperlipidemia 09/27/2013  . Osteoporosis   . Pneumonia 11/2016  . Primary localized osteoarthritis of right knee   . Slow transit constipation   . Unsteady gait     Patient Active Problem List   Diagnosis Date Noted  . S/P reverse total shoulder arthroplasty, left 01/07/2017  . Bipolar I disorder with depression, severe ([place])  10/26/2016  . Substance use disorder 10/25/2016  . Severe episode of recurrent major depressive disorder, without psychotic features ([place])   . Impacted cerumen of left ear 03/23/2016  . Primary localized osteoarthritis of right knee   . Rotator cuff tear arthropathy 12/13/2014  . Ulcer of right second toe ([place]) 12/21/2013  . Family history of ischemic heart disease 09/27/2013  . Essential hypertension, benign 09/27/2013  . Mixed hyperlipidemia 09/27/2013  . Arthritis   . Anxiety   . Depression   . GERD (gastroesophageal reflux disease)   . Meniere disease   . Osteoporosis   . Hearing impaired     Past Surgical History:  Procedure Laterality Date  . ABDOMINAL HYSTERECTOMY  1986  . bladder tack  2007  . COCHLEAR IMPLANT     2007  . COLONOSCOPY    . HAMMER TOE SURGERY  2014   left foot  . REVERSE SHOULDER ARTHROPLASTY Right 12/13/2014   dr Tamera Punt  . REVERSE SHOULDER ARTHROPLASTY Right 12/13/2014   Procedure: REVERSE SHOULDER ARTHROPLASTY rt;  Surgeon: Nita Sells, MD;  Location: [facility];  Service: Orthopedics;  Laterality: Right;  Right reverse total shoulder replacement  . REVERSE SHOULDER ARTHROPLASTY Left 01/07/2017   Procedure: REVERSE LEFT TOTAL SHOULDER ARTHROPLASTY;  Surgeon: Tania Ade, MD;  Location: [facility];  Service: Orthopedics;  Laterality: Left;  Left reveres total shoulder arthroplasty  . TOTAL KNEE ARTHROPLASTY Right 12/16/2015   Procedure: TOTAL KNEE ARTHROPLASTY;  Surgeon: Elsie Saas, MD;  Location: [facility];  Service: Orthopedics;  Laterality: Right;     OB History   None      Home Medications    Prior to Admission medications   Medication Sig Start Date End Date Taking? Authorizing Provider  acetaminophen (TYLENOL) 500 MG tablet Take 1,000 mg by mouth every 6 (six) hours as needed for mild pain (for pain.).     [provider]  aspirin EC 81 MG tablet Take 81 mg by mouth daily.    [provider]  atorvastatin (LIPITOR)  10 MG tablet Take 10 mg by mouth at bedtime.     [provider]  baclofen (LIORESAL) 10 MG tablet Take 10 mg by mouth 2 (two) times daily.    [provider]  buPROPion (WELLBUTRIN XL) 300 MG 24 hr tablet Take 300 mg by mouth daily.    [provider]  calcium carbonate (TUMS - DOSED IN MG ELEMENTAL CALCIUM) 500 MG chewable tablet Chew 1 tablet by mouth 2 (two) times daily.    [provider]  cephALEXin (KEFLEX) 500 MG capsule Take 1 capsule (500 mg total) by mouth 3 (three) times daily. Patient not taking: Reported on 03/21/2018 02/18/18   Trula Slade, DPM  cholecalciferol (VITAMIN D) 1000 units tablet Take 1,000 Units by mouth at bedtime.     [provider]  clonazePAM [facility]) 1 MG tablet  12/09/17   [provider]  diclofenac (VOLTAREN) 75 MG EC tablet Take 75 mg by mouth 2 (two) times daily.    [provider]  diphenhydrAMINE (BENADRYL) 12.5 MG chewable tablet Chew 12.5 mg by mouth at bedtime as needed for allergies. sublingual     [provider]  DULoxetine (CYMBALTA) 60 MG capsule Take 60 mg by mouth 2 (two) times daily.     [provider]  hydrochlorothiazide (HYDRODIURIL) 25 MG tablet Take 25 mg by mouth Daily.  08/05/12   [provider]  HYDROcodone-acetaminophen [facility]) 10-325 MG tablet  01/14/18   [provider]  HYDROcodone-homatropine (HYCODAN) 5-1.5 MG/5ML syrup Take 5 mLs by mouth every 6 (six) hours as needed for cough.    [provider]  KLOR-CON M20 20 MEQ tablet Take 20 mEq by mouth daily.  07/18/12   [provider]  lamoTRIgine (LAMICTAL) 200 MG tablet Take 200 mg by mouth daily.    [provider]  montelukast (SINGULAIR) 10 MG tablet Take 10 mg by mouth at bedtime.  07/15/12   [provider]  mupirocin ointment (BACTROBAN) 2 % Apply 1 application topically 2 (two) times daily. Patient not taking: Reported on 03/21/2018 02/18/18    Trula Slade, DPM  Omega-3 Fatty Acids (FISH OIL) 1200 MG CAPS Take 1,200 mg by mouth daily.     [provider]  OVER THE COUNTER MEDICATION Take 10 mg by mouth at bedtime. saphris    rx not over the counter    [provider]  sennosides-docusate sodium (SENOKOT-S) 8.6-50 MG tablet Take 1 tablet by mouth daily as needed for constipation.     [provider]  vitamin C (ASCORBIC ACID) 500 MG tablet Take 500 mg by mouth 2 (two) times daily.    [provider]    Family History Family History  Problem Relation Age  of Onset  . Heart disease Mother   . Breast cancer Mother   . Cervical cancer Mother   . Diabetes Mother   . Heart disease Father   . Diabetes Maternal Aunt   . Colon cancer Neg Hx   . Esophageal cancer Neg Hx   . Rectal cancer Neg Hx   . Stomach cancer Neg Hx   . Liver cancer Neg Hx   . Pancreatic cancer Neg Hx     Social History Social History   Tobacco Use  . Smoking status: Former Smoker    Packs/day: 0.25    Years: 23.00    Pack years: 5.75    Last attempt to quit: 08/25/1993    Years since quitting: 24.9  . Smokeless tobacco: Never Used  Substance Use Topics  . Alcohol use: Yes    Alcohol/week: 10.0 standard drinks    Types: 10 Shots of liquor per week    Comment: none since christmas  . Drug use: No     Allergies   Latex   Review of Systems Review of Systems  Constitutional: Positive for fatigue. Negative for chills, diaphoresis and fever.  HENT: Negative for congestion, rhinorrhea and sneezing.   Eyes: Negative.   Respiratory: Positive for cough and shortness of breath. Negative for chest tightness.   Cardiovascular: Positive for leg swelling. Negative for chest pain.  Gastrointestinal: Negative for abdominal pain, blood in stool, diarrhea, nausea and vomiting.  Genitourinary: Negative for difficulty urinating, flank pain, frequency and hematuria.  Musculoskeletal: Negative for arthralgias and back  pain.  Skin: Negative for rash.  Neurological: Negative for dizziness, speech difficulty, weakness, numbness and headaches.     Physical Exam Updated Vital Signs BP (!) 157/81   Pulse 87   Temp 98.9 F (37.2 C) (Oral)   Resp 18   LMP  (LMP Unknown)   SpO2 97%   Physical Exam  Constitutional: She is oriented to person, place, and time. She appears well-developed and well-nourished.  HENT:  Head: Normocephalic and atraumatic.  Eyes: Pupils are equal, round, and reactive to light.  Neck: Normal range of motion. Neck supple.  Cardiovascular: Normal rate, regular rhythm and normal heart sounds.  Pulmonary/Chest: Effort normal and breath sounds normal. No respiratory distress. She has no wheezes. She has no rales. She exhibits no tenderness.  Abdominal: Soft. Bowel sounds are normal. There is no tenderness. There is no rebound and no guarding.  Musculoskeletal: Normal range of motion. She exhibits edema.  Patient has minimal trace edema bilaterally.  There is some diffuse tenderness to the legs on palpation.  Pedal pulses are intact.  There is no erythema or warmth.  Lymphadenopathy:    She has no cervical adenopathy.  Neurological: She is alert and oriented to person, place, and time.  Skin: Skin is warm and dry. No rash noted.  Psychiatric: She has a normal mood and affect.     ED Treatments / Results  Labs (all labs ordered are listed, but only abnormal results are displayed) Labs Reviewed  BASIC METABOLIC PANEL - Abnormal; Notable for the following components:      Result Value   Sodium 130 (*)    Chloride 97 (*)    Glucose, Bld 116 (*)    All other components within normal limits  CBC  BRAIN NATRIURETIC PEPTIDE  I-STAT TROPONIN, ED    EKG EKG Interpretation  Date/Time:  Friday August 12 2018 16:25:51 EDT Ventricular Rate:  91 PR Interval:  120 QRS Duration:  82 QT Interval:  378 QTC Calculation: 464 R Axis:   61 Text Interpretation:  Normal sinus rhythm  Normal ECG since last tracing no significant change Confirmed by Malvin Johns [phone]) on 08/12/2018 11:16:06 PM   Radiology Ct Angio Chest Pe W And/or Wo Contrast  Result Date: 08/12/2018 CLINICAL DATA:  72 y/o  F; elevated D-dimer. Shortness of breath. EXAM: CT ANGIOGRAPHY CHEST WITH CONTRAST TECHNIQUE: Multidetector CT imaging of the chest was performed using the standard protocol during bolus administration of intravenous contrast. Multiplanar CT image reconstructions and MIPs were obtained to evaluate the vascular anatomy. CONTRAST:  61 cc Isovue 370 COMPARISON:  05/15/2009 CT angiogram of the chest. FINDINGS: Cardiovascular: Satisfactory opacification of the pulmonary arteries to the segmental level. No evidence of pulmonary embolism. Normal heart size. No pericardial effusion. Mild aortic and coronary artery calcific atherosclerosis. Mediastinum/Nodes: No enlarged mediastinal, hilar, or axillary lymph nodes. Thyroid gland, trachea, and esophagus demonstrate no significant findings. Lungs/Pleura: Stable 4 mm nodule in the right major fissure compatible with intrapulmonary lymph node. No consolidation, effusion, or pneumothorax. Upper Abdomen: Small hiatal hernia. Musculoskeletal: Bilateral shoulder arthroplasty postsurgical changes, partially visualized. Mild S-shaped curvature of the thoracic spine and moderate spondylosis. T2-3 and T5-6 grade 1 anterolisthesis. Review of the MIP images confirms the above findings. IMPRESSION: 1. No pulmonary embolus identified. 2. Mild aortic and coronary artery calcific atherosclerosis. 3. Small hiatal hernia. 4. Mild S-shaped curvature of the thoracic spine and moderate spondylosis. Electronically Signed   By: Kristine Garbe M.D.   On: 08/12/2018 20:26    Procedures Procedures (including critical care time)  Medications Ordered in ED Medications  iopamidol (ISOVUE-370) 76 % injection (has no administration in time range)  iopamidol (ISOVUE-370)  76 % injection 100 mL (100 mLs Intravenous Contrast Given 08/12/18 2000)  LORazepam (ATIVAN) injection 0.5 mg (0.5 mg Intravenous Given 08/13/18 0109)     Initial Impression / Assessment and Plan / ED Course  I have reviewed the triage vital signs and the nursing notes.  Pertinent labs & imaging results that were available during my care of the patient were reviewed by me and considered in my medical decision making (see chart for details).     Patient is a 72 year old female who presents with shortness of breath.  She has no evidence of CHF.  No evidence of pneumonia.  No evidence of PE on CT scan of her chest.  Her labs are non-concerning.  Her EKG does not show any ischemic changes and her troponin is negative.  She does not have other symptoms that would be more concerning for ACS.  She was fairly anxious in the ED and requesting something for anxiety.  She did get a small dose of Ativan.  She was then ambulated and she had no reports of shortness of breath or hypoxia on ambulation.  She was discharged home in good condition.  I did order ultrasound of her lower extremities for tomorrow given that presumptively her d-dimer was elevated by her PCP.  I do not see any significant unilateral swelling which would warrant prophylactic anticoagulation.  She was encouraged to follow-up with her PCP on Monday.  Return precautions were given.  Final Clinical Impressions(s) / ED Diagnoses   Final diagnoses:  Shortness of breath    ED Discharge Orders         Ordered    LE VENOUS     08/13/18 0230           Malvin Johns, MD 08/13/18  0232  

## 2018-08-13 NOTE — ED Notes (Signed)
Patient verbalizes understanding of discharge instructions. Opportunity for questioning and answers were provided. Armband removed by staff, pt discharged from ED.  

## 2018-08-13 NOTE — Progress Notes (Signed)
Bilateral lower extremity venous duplex completed. There is no evidence of a DVT or Baker's cyst. [place]our,RVS 08/13/2018 8:50 AM

## 2018-08-15 ENCOUNTER — Ambulatory Visit (HOSPITAL_BASED_OUTPATIENT_CLINIC_OR_DEPARTMENT_OTHER): Admission: RE | Admit: 2018-08-15 | Payer: Medicare Other | Source: Ambulatory Visit

## 2018-08-26 DIAGNOSIS — R0600 Dyspnea, unspecified: Secondary | ICD-10-CM | POA: Diagnosis not present

## 2018-09-19 ENCOUNTER — Telehealth: Payer: Self-pay

## 2018-09-19 DIAGNOSIS — M1712 Unilateral primary osteoarthritis, left knee: Secondary | ICD-10-CM | POA: Diagnosis not present

## 2018-09-19 NOTE — Telephone Encounter (Signed)
SENT REFERRAL TO SCHEDULING AND FILED NOTES 

## 2018-10-07 ENCOUNTER — Ambulatory Visit: Payer: Medicare Other | Admitting: Cardiology

## 2018-10-10 DIAGNOSIS — M5416 Radiculopathy, lumbar region: Secondary | ICD-10-CM | POA: Diagnosis not present

## 2018-10-13 ENCOUNTER — Other Ambulatory Visit: Payer: Self-pay | Admitting: Orthopedic Surgery

## 2018-10-13 DIAGNOSIS — M5416 Radiculopathy, lumbar region: Secondary | ICD-10-CM

## 2018-10-14 ENCOUNTER — Encounter: Payer: Self-pay | Admitting: Cardiology

## 2018-10-26 ENCOUNTER — Ambulatory Visit
Admission: RE | Admit: 2018-10-26 | Discharge: 2018-10-26 | Disposition: A | Discharge status: Home / Self Care | Payer: Medicare Other | Source: Ambulatory Visit | Attending: Orthopedic Surgery | Admitting: Orthopedic Surgery

## 2018-10-26 DIAGNOSIS — M5416 Radiculopathy, lumbar region: Secondary | ICD-10-CM

## 2018-10-26 DIAGNOSIS — M25552 Pain in left hip: Secondary | ICD-10-CM | POA: Diagnosis not present

## 2018-10-27 DIAGNOSIS — M5416 Radiculopathy, lumbar region: Secondary | ICD-10-CM | POA: Diagnosis not present

## 2018-10-27 DIAGNOSIS — M5137 Other intervertebral disc degeneration, lumbosacral region: Secondary | ICD-10-CM | POA: Diagnosis not present

## 2018-11-02 DIAGNOSIS — Z23 Encounter for immunization: Secondary | ICD-10-CM | POA: Diagnosis not present

## 2018-11-02 DIAGNOSIS — L710 Perioral dermatitis: Secondary | ICD-10-CM | POA: Diagnosis not present

## 2018-11-04 DIAGNOSIS — M5416 Radiculopathy, lumbar region: Secondary | ICD-10-CM | POA: Diagnosis not present

## 2018-11-09 DIAGNOSIS — H903 Sensorineural hearing loss, bilateral: Secondary | ICD-10-CM | POA: Diagnosis not present

## 2018-11-11 ENCOUNTER — Ambulatory Visit: Payer: Medicare Other | Admitting: Cardiology

## 2018-11-11 ENCOUNTER — Encounter: Payer: Self-pay | Admitting: Cardiology

## 2018-11-11 ENCOUNTER — Encounter: Payer: Self-pay | Admitting: *Deleted

## 2018-11-11 VITALS — BP 122/72 | HR 101 | Ht 61.0 in | Wt 165.0 lb

## 2018-11-11 DIAGNOSIS — Z01810 Encounter for preprocedural cardiovascular examination: Secondary | ICD-10-CM | POA: Diagnosis not present

## 2018-11-11 DIAGNOSIS — I209 Angina pectoris, unspecified: Secondary | ICD-10-CM | POA: Diagnosis not present

## 2018-11-11 DIAGNOSIS — I10 Essential (primary) hypertension: Secondary | ICD-10-CM | POA: Diagnosis not present

## 2018-11-11 DIAGNOSIS — M1712 Unilateral primary osteoarthritis, left knee: Secondary | ICD-10-CM | POA: Diagnosis not present

## 2018-11-11 DIAGNOSIS — Z01818 Encounter for other preprocedural examination: Secondary | ICD-10-CM | POA: Diagnosis not present

## 2018-11-11 DIAGNOSIS — Z8249 Family history of ischemic heart disease and other diseases of the circulatory system: Secondary | ICD-10-CM

## 2018-11-11 DIAGNOSIS — R7303 Prediabetes: Secondary | ICD-10-CM | POA: Diagnosis not present

## 2018-11-11 NOTE — Patient Instructions (Signed)
Medication Instructions:  The current medical regimen is effective;  continue present plan and medications.  If you need a refill on your cardiac medications before your next appointment, please call your pharmacy.   Testing/Procedures: Your physician has requested that you have an echocardiogram. Echocardiography is a painless test that uses sound waves to create images of your heart. It provides your doctor with information about the size and shape of your heart and how well your heart's chambers and valves are working. This procedure takes approximately one hour. There are no restrictions for this procedure.  Your physician has requested that you have a lexiscan myoview. For further information please visit [email or web address]. Please follow instruction sheet, as given.  Follow-Up: Follow up to be determine based on the results of the above testing.  Thank you for choosing [facility]!!

## 2018-11-11 NOTE — Progress Notes (Signed)
Cardiology Office Note:    Date:  11/11/2018   ID:  Elizabeth, Stone Sep 05, 1946, MRN [id number]  PCP:  Orpah Melter, MD  Cardiologist:  Candee Furbish, MD  Electrophysiologist:  None   Referring MD: Orpah Melter, MD    History of Present Illness:    Elizabeth Stone is a 73 y.o. female here for the evaluation of shortness of breath is a possible symptom of heart issue at the request of Dr. Christella Noa.  Last seen by Richardson Dopp on 11/19/2015.  She has been feeling weak in her legs, more short of breath for several months progressively worse.  When she first gets up in the morning she seems to be somewhat short of breath, winded.  Just tying shoes can make her winded.  Walking short distances make her feel even worse.  Minor ankle edema.  Sometimes feels palpitations as well.  In review of prior office notes she was having more shortness of breath, D-Dimer was elevated and she went to the emergency room had a CT angiogram venous Dopplers which were unremarkable.  I personally reviewed her CT scan and she does have some focal proximal LAD as well as circumflex artery calcification.  Bilateral shoulder replacements noted.  She has had a history of fibromyalgia bipolar type II and elevated liver function.  Was on atorvastatin but this was previously held to see if this helps her with her symptoms.  Prior BMP in October 2019 was 6, normal.  Hemoglobin 14.7.  Past Medical History:  Diagnosis Date  . Acute blood loss anemia    hx  . Anxiety   . Arthritis   . Bipolar disorder ([place])   . Depression   . Dyspnea   . Fibromyalgia   . GERD (gastroesophageal reflux disease)   . Hearing impaired    has cochlear implant  . HTN (hypertension)   . Hypokalemia   . Meniere disease    takes HCTZ for her inner ear problem  . Mixed hyperlipidemia 09/27/2013  . Osteoporosis   . Pneumonia 11/2016  . Primary localized osteoarthritis of right knee   . Slow transit constipation   . Unsteady gait      Past Surgical History:  Procedure Laterality Date  . ABDOMINAL HYSTERECTOMY  1986  . bladder tack  2007  . COCHLEAR IMPLANT     2007  . COLONOSCOPY    . HAMMER TOE SURGERY  2014   left foot  . REVERSE SHOULDER ARTHROPLASTY Right 12/13/2014   dr Tamera Punt  . REVERSE SHOULDER ARTHROPLASTY Right 12/13/2014   Procedure: REVERSE SHOULDER ARTHROPLASTY rt;  Surgeon: Nita Sells, MD;  Location: [facility];  Service: Orthopedics;  Laterality: Right;  Right reverse total shoulder replacement  . REVERSE SHOULDER ARTHROPLASTY Left 01/07/2017   Procedure: REVERSE LEFT TOTAL SHOULDER ARTHROPLASTY;  Surgeon: Tania Ade, MD;  Location: [facility];  Service: Orthopedics;  Laterality: Left;  Left reveres total shoulder arthroplasty  . TOTAL KNEE ARTHROPLASTY Right 12/16/2015   Procedure: TOTAL KNEE ARTHROPLASTY;  Surgeon: Elsie Saas, MD;  Location: [facility];  Service: Orthopedics;  Laterality: Right;    Current Medications: Current Meds  Medication Sig  . acetaminophen (TYLENOL) 500 MG tablet Take 1,000 mg by mouth every 6 (six) hours as needed for mild pain (for pain.).   Marland Kitchen Ascorbic Acid (VITAMIN C) 1000 MG tablet Take 1,000 mg by mouth daily.  . Asenapine Maleate 10 MG SUBL Place 10 mg under the tongue at bedtime.  Marland Kitchen aspirin  EC 81 MG tablet Take 81 mg by mouth daily.  Marland Kitchen atorvastatin (LIPITOR) 10 MG tablet Take 10 mg by mouth at bedtime.   Marland Kitchen buPROPion (WELLBUTRIN XL) 300 MG 24 hr tablet Take 300 mg by mouth daily.  . calcium carbonate (TUMS - DOSED IN MG ELEMENTAL CALCIUM) 500 MG chewable tablet Chew 1 tablet by mouth 3 (three) times daily as needed for indigestion or heartburn.   . Calcium Carbonate-Vit D-Min (CALCIUM 1200 PO) Take 1 tablet by mouth at bedtime.  . Cholecalciferol (VITAMIN D3) 50 MCG (2000 UT) TABS Take 2,000 Units by mouth at bedtime.  . clonazePAM (KLONOPIN) 0.5 MG tablet Take 1 mg by mouth at bedtime.  . diclofenac (VOLTAREN) 75 MG EC tablet Take 75 mg by mouth 2 (two)  times daily.  Marland Kitchen docusate sodium (COLACE) 100 MG capsule Take 100 mg by mouth every other day as needed (constipation.).  Marland Kitchen doxycycline (VIBRA-TABS) 100 MG tablet Take 100 mg by mouth daily.  . DULoxetine (CYMBALTA) 60 MG capsule Take 60 mg by mouth 2 (two) times daily.   . hydrochlorothiazide (HYDRODIURIL) 25 MG tablet Take 25 mg by mouth Daily.   Marland Kitchen HYDROcodone-acetaminophen (NORCO) 10-325 MG tablet Take 1 tablet by mouth every 6 (six) hours as needed (pain).   Marland Kitchen HYDROcodone-homatropine (HYCODAN) 5-1.5 MG/5ML syrup Take 5 mLs by mouth every 6 (six) hours as needed for cough.  . hydrOXYzine (ATARAX/VISTARIL) 25 MG tablet Take 25-50 mg by mouth at bedtime.  Marland Kitchen KLOR-CON M20 20 MEQ tablet Take 20 mEq by mouth daily.   Marland Kitchen lamoTRIgine (LAMICTAL) 200 MG tablet Take 200 mg by mouth at bedtime.   . metroNIDAZOLE (METROCREAM) 0.75 % cream Apply 1 application topically at bedtime.  . montelukast (SINGULAIR) 10 MG tablet Take 10 mg by mouth at bedtime.   . sennosides-docusate sodium (SENOKOT-S) 8.6-50 MG tablet Take 2 tablets by mouth every other day as needed for constipation.    Current Facility-Administered Medications for the 11/11/18 encounter (Office Visit) with Jerline Pain, MD  Medication  . 0.9 %  sodium chloride infusion     Allergies:   Latex   Social History   Socioeconomic History  . Marital status: Widowed    Spouse name: Not on file  . Number of children: Not on file  . Years of education: Not on file  . Highest education level: Not on file  Occupational History  . Not on file  Social Needs  . Financial resource strain: Not on file  . Food insecurity:    Worry: Not on file    Inability: Not on file  . Transportation needs:    Medical: Not on file    Non-medical: Not on file  Tobacco Use  . Smoking status: Former Smoker    Packs/day: 0.25    Years: 23.00    Pack years: 5.75    Last attempt to quit: 08/25/1993    Years since quitting: 25.2  . Smokeless tobacco: Never Used   Substance and Sexual Activity  . Alcohol use: Yes    Alcohol/week: 10.0 standard drinks    Types: 10 Shots of liquor per week    Comment: none since christmas  . Drug use: No  . Sexual activity: Not on file  Lifestyle  . Physical activity:    Days per week: Not on file    Minutes per session: Not on file  . Stress: Not on file  Relationships  . Social connections:    Talks on phone:  Not on file    Gets together: Not on file    Attends religious service: Not on file    Active member of club or organization: Not on file    Attends meetings of clubs or organizations: Not on file    Relationship status: Not on file  Other Topics Concern  . Not on file  Social History Narrative  . Not on file     Family History: The patient's family history includes Breast cancer in her mother; Cervical cancer in her mother; Diabetes in her maternal aunt and mother; Heart disease in her father and mother. There is no history of Colon cancer, Esophageal cancer, Rectal cancer, Stomach cancer, Liver cancer, or Pancreatic cancer.  ROS:   Please see the history of present illness.    Denies any fevers chills nausea vomiting syncope bleeding all other systems reviewed and are negative.  EKGs/Labs/Other Studies Reviewed:    The following studies were reviewed today: Prior office notes CT scan personally reviewed, office notes, EKG  EKG:  EKG is not  ordered today.  The ekg ordered 08/12/2018-sinus rhythm with no other significant abnormalities.  Personally reviewed and interpreted  Recent Labs: 08/12/2018: BUN 10; Creatinine, Ser 0.82; Hemoglobin 14.3; Platelets 208; Potassium 3.8; Sodium 130 08/13/2018: B Natriuretic Peptide 16.3  Recent Lipid Panel No results found for: CHOL, TRIG, HDL, CHOLHDL, VLDL, LDLCALC, LDLDIRECT  Physical Exam:    VS:  BP 122/72   Pulse (!) 101   Ht 5\' 1"  (1.549 m)   Wt 165 lb (74.8 kg)   LMP  (LMP Unknown)   SpO2 98%   BMI 31.18 kg/m     Wt Readings from Last  3 Encounters:  11/11/18 165 lb (74.8 kg)  11/30/17 167 lb (75.8 kg)  11/16/17 167 lb 3.2 oz (75.8 kg)     GEN:  Well nourished, well developed in no acute distress HEENT: Normal NECK: No JVD; No carotid bruits LYMPHATICS: No lymphadenopathy CARDIAC: RRR, no murmurs, rubs, gallops RESPIRATORY:  Clear to auscultation without rales, wheezing or rhonchi  ABDOMEN: Soft, non-tender, non-distended MUSCULOSKELETAL:  No edema; No deformity  SKIN: Warm and dry NEUROLOGIC:  Alert and oriented x 3 PSYCHIATRIC:  Normal affect   ASSESSMENT:    1. Angina pectoris ([place])   2. Essential hypertension, benign   3. Pre-operative cardiovascular examination   4. Family history of ischemic heart disease    PLAN:    In order of problems listed above:  Shortness of breath, possible anginal equivalent - PE CT was negative (d-dimer was positive).  Mild coronary artery calcification noted in the LAD as well as circumflex.  Since she is going to undergo a knee surgery soon, I think it behooves [place] to check her heart out with her shortness of breath to make sure that her shortness of breath is not an anginal equivalent.  We will check an echocardiogram to ensure proper structure and function of her heart and will also check a nuclear stress test, Lexiscan because she cannot walk effectively given her knee pain.  Family history of CAD -Her father had a heart attack in his 21s.  Essential hypertension -Currently well controlled no changes made  Hyperlipidemia -LDL was 173 at one point with triglycerides greater than 200.  Currently on atorvastatin 10 mg a day.   Medication Adjustments/Labs and Tests Ordered: Current medicines are reviewed at length with the patient today.  Concerns regarding medicines are outlined above.  Orders Placed This Encounter  Procedures  .  MYOCARDIAL PERFUSION IMAGING  . ECHOCARDIOGRAM COMPLETE   No orders of the defined types were placed in this encounter.   Patient  Instructions  Medication Instructions:  The current medical regimen is effective;  continue present plan and medications.  If you need a refill on your cardiac medications before your next appointment, please call your pharmacy.   Testing/Procedures: Your physician has requested that you have an echocardiogram. Echocardiography is a painless test that uses sound waves to create images of your heart. It provides your doctor with information about the size and shape of your heart and how well your heart's chambers and valves are working. This procedure takes approximately one hour. There are no restrictions for this procedure.  Your physician has requested that you have a lexiscan myoview. For further information please visit [email or web address]. Please follow instruction sheet, as given.  Follow-Up: Follow up to be determine based on the results of the above testing.  Thank you for choosing [facility]!!          Signed, Candee Furbish, MD  11/11/2018 5:03 PM    [place]

## 2018-11-15 ENCOUNTER — Telehealth (HOSPITAL_COMMUNITY): Payer: Self-pay | Admitting: *Deleted

## 2018-11-15 NOTE — Telephone Encounter (Signed)
Left message on voicemail per DPR in reference to upcoming appointment scheduled on 11/18/18 at 1015 with detailed instructions given per Myocardial Perfusion Study Information Sheet for the test. LM to arrive 15 minutes early, and that it is imperative to arrive on time for appointment to keep from having the test rescheduled. If you need to cancel or reschedule your appointment, please call the office within 24 hours of your appointment. Failure to do so may result in a cancellation of your appointment, and a $50 no show fee. Phone number given for call back for any questions. Latavion Halls, Ranae Palms

## 2018-11-15 NOTE — Telephone Encounter (Signed)
Left message on voicemail per DPR in reference to upcoming appointment scheduled on 11/18/18 at 10:15 with detailed instructions given per Myocardial Perfusion Study Information Sheet for the test. LM to arrive 15 minutes early, and that it is imperative to arrive on time for appointment to keep from having the test rescheduled. If you need to cancel or reschedule your appointment, please call the office within 24 hours of your appointment. Failure to do so may result in a cancellation of your appointment, and a $50 no show fee. Phone number given for call back for any questions. [name]

## 2018-11-16 ENCOUNTER — Encounter: Payer: Self-pay | Admitting: Physician Assistant

## 2018-11-16 DIAGNOSIS — M1712 Unilateral primary osteoarthritis, left knee: Secondary | ICD-10-CM

## 2018-11-16 DIAGNOSIS — M5137 Other intervertebral disc degeneration, lumbosacral region: Secondary | ICD-10-CM | POA: Diagnosis not present

## 2018-11-16 HISTORY — DX: Unilateral primary osteoarthritis, left knee: M17.12

## 2018-11-16 NOTE — Pre-Procedure Instructions (Signed)
Elizabeth Stone  11/16/2018      [place], [place] [address] Phone: [phone] Fax: [phone]    Your procedure is scheduled on Monday, Feb. 10th   Report to [facility] Admitting at 5:30 AM             (posted surgery time 7:15a - 9:20a)   Call this number if you have problems the morning of surgery:  [phone]   Remember:   Do not eat any foods or drink any liquids after midnight, Sunday.              7 days prior to surgery, STOP TAKING ANY VITAMINS, Herbal Supplements, Anti-Inflammatories, Blood Thinners    Take these medicines the morning of surgery with A SIP OF WATER : Cymbalta, Hydrocodone    Do not wear jewelry, make-up or nail polish.  Do not wear lotions, powders, perfumes, or deodorant.  Do not shave 48 hours prior to surgery.    Do not bring valuables to the hospital.  [facility] is not responsible for any belongings or valuables.  Contacts, dentures or bridgework may not be worn into surgery.  Leave your suitcase in the car.  After surgery it may be brought to your room.  For patients admitted to the hospital, discharge time will be determined by your treatment team.  Please read over the following fact sheets that you were given. Pain Booklet, MRSA Information and Surgical Site Infection Prevention       [facility]- Preparing For Surgery  Before surgery, you can play an important role. Because skin is not sterile, your skin needs to be as free of germs as possible. You can reduce the number of germs on your skin by washing with CHG (chlorahexidine gluconate) Soap before surgery.  CHG is an antiseptic cleaner which kills germs and bonds with the skin to continue killing germs even after washing.    Oral Hygiene is also important to reduce your risk of infection.    Remember - BRUSH YOUR TEETH THE MORNING OF SURGERY WITH YOUR REGULAR  TOOTHPASTE  Please do not use if you have an allergy to CHG or antibacterial soaps. If your skin becomes reddened/irritated stop using the CHG.  Do not shave (including legs and underarms) for at least 48 hours prior to first CHG shower. It is OK to shave your face.  Please follow these instructions carefully.   1. Shower the NIGHT BEFORE SURGERY and the MORNING OF SURGERY with CHG.   2. If you chose to wash your hair, wash your hair first as usual with your normal shampoo.  3. After you shampoo, rinse your hair and body thoroughly to remove the shampoo.  4. Use CHG as you would any other liquid soap. You can apply CHG directly to the skin and wash gently with a scrungie or a clean washcloth.   5. Apply the CHG Soap to your body ONLY FROM THE NECK DOWN.  Do not use on open wounds or open sores. Avoid contact with your eyes, ears, mouth and genitals (private parts). Wash Face and genitals (private parts)  with your normal soap.  6. Wash thoroughly, paying special attention to the area where your surgery will be performed.  7. Thoroughly rinse your body with warm water from the neck down.  8. DO NOT shower/wash with your normal soap after using and rinsing off the  CHG Soap.  9. Pat yourself dry with a CLEAN TOWEL.  10. Wear CLEAN PAJAMAS to bed the night before surgery, wear comfortable clothes the morning of surgery  11. Place CLEAN SHEETS on your bed the night of your first shower and DO NOT SLEEP WITH PETS.  Day of Surgery:  Do not apply any deodorants/lotions.  Please wear clean clothes to the hospital/surgery center.    Remember to brush your teeth WITH YOUR REGULAR TOOTHPASTE.  PLEASE RE-READ OVER THESE INSTRUCTIONS!

## 2018-11-16 NOTE — H&P (Addendum)
TOTAL KNEE ADMISSION H&P  Patient is being admitted for left total knee arthroplasty.  Subjective:  Chief Complaint:left knee pain.  HPI: Elizabeth Stone, 73 y.o. female, has a history of pain and functional disability in the left knee due to arthritis and has failed non-surgical conservative treatments for greater than 12 weeks to includeNSAID's and/or analgesics, corticosteriod injections, viscosupplementation injections, flexibility and strengthening excercises, supervised PT with diminished ADL's post treatment, use of assistive devices and activity modification.  Onset of symptoms was gradual, starting 10 years ago with gradually worsening course since that time. The patient noted no past surgery on the left knee(s).  Patient currently rates pain in the left knee(s) at 10 out of 10 with activity. Patient has night pain, worsening of pain with activity and weight bearing, pain that interferes with activities of daily living, crepitus and joint swelling.  Patient has evidence of subchondral sclerosis, periarticular osteophytes and joint space narrowing by imaging studies.There is no active infection.  Patient Active Problem List   Diagnosis Date Noted  . Primary localized osteoarthritis of left knee 11/16/2018  . S/P reverse total shoulder arthroplasty, left 01/07/2017  . Bipolar I disorder with depression, severe ([place]) 10/26/2016  . Substance use disorder 10/25/2016  . Severe episode of recurrent major depressive disorder, without psychotic features ([place])   . Impacted cerumen of left ear 03/23/2016  . Primary localized osteoarthritis of right knee   . Rotator cuff tear arthropathy 12/13/2014  . Ulcer of right second toe ([place]) 12/21/2013  . Family history of ischemic heart disease 09/27/2013  . Essential hypertension, benign 09/27/2013  . Mixed hyperlipidemia 09/27/2013  . Arthritis   . Anxiety   . Depression   . GERD (gastroesophageal reflux disease)   . Meniere disease   . Osteoporosis    . Hearing impaired    Past Medical History:  Diagnosis Date  . Acute blood loss anemia    hx  . Anxiety   . Arthritis   . Bipolar disorder ([place])   . Depression   . Dyspnea   . Fibromyalgia   . GERD (gastroesophageal reflux disease)   . Hearing impaired    has cochlear implant  . HTN (hypertension)   . Hypokalemia   . Meniere disease    takes HCTZ for her inner ear problem  . Mixed hyperlipidemia 09/27/2013  . Osteoporosis   . Pneumonia 11/2016  . Primary localized osteoarthritis of left knee 11/16/2018  . Primary localized osteoarthritis of right knee   . Slow transit constipation   . Unsteady gait     Past Surgical History:  Procedure Laterality Date  . ABDOMINAL HYSTERECTOMY  1986  . bladder tack  2007  . COCHLEAR IMPLANT     2007  . COLONOSCOPY    . HAMMER TOE SURGERY  2014   left foot  . REVERSE SHOULDER ARTHROPLASTY Right 12/13/2014   dr Tamera Punt  . REVERSE SHOULDER ARTHROPLASTY Right 12/13/2014   Procedure: REVERSE SHOULDER ARTHROPLASTY rt;  Surgeon: Nita Sells, MD;  Location: [facility];  Service: Orthopedics;  Laterality: Right;  Right reverse total shoulder replacement  . REVERSE SHOULDER ARTHROPLASTY Left 01/07/2017   Procedure: REVERSE LEFT TOTAL SHOULDER ARTHROPLASTY;  Surgeon: Tania Ade, MD;  Location: [facility];  Service: Orthopedics;  Laterality: Left;  Left reveres total shoulder arthroplasty  . TOTAL KNEE ARTHROPLASTY Right 12/16/2015   Procedure: TOTAL KNEE ARTHROPLASTY;  Surgeon: Elsie Saas, MD;  Location: [facility];  Service: Orthopedics;  Laterality: Right;  Current Facility-Administered Medications  Medication Dose Route Frequency Provider Last Rate Last Dose  . 0.9 %  sodium chloride infusion  500 mL Intravenous Once Armbruster, Carlota Raspberry, MD       Current Outpatient Medications  Medication Sig Dispense Refill Last Dose  . acetaminophen (TYLENOL) 500 MG tablet Take 1,000 mg by mouth every 6 (six) hours as needed for mild pain (for  pain.).    Past Week at Unknown time  . Ascorbic Acid (VITAMIN C) 1000 MG tablet Take 1,000 mg by mouth daily.   11/16/2018 at Unknown time  . Asenapine Maleate 10 MG SUBL Place 10 mg under the tongue at bedtime.   11/15/2018 at Unknown time  . aspirin EC 81 MG tablet Take 81 mg by mouth daily.   11/16/2018 at Unknown time  . atorvastatin (LIPITOR) 10 MG tablet Take 10 mg by mouth at bedtime.    11/15/2018 at Unknown time  . buPROPion (WELLBUTRIN XL) 300 MG 24 hr tablet Take 300 mg by mouth daily.   11/16/2018 at Unknown time  . calcium carbonate (TUMS - DOSED IN MG ELEMENTAL CALCIUM) 500 MG chewable tablet Chew 1 tablet by mouth 3 (three) times daily as needed for indigestion or heartburn.    Past Week at Unknown time  . Calcium Carbonate-Vit D-Min (CALCIUM 1200 PO) Take 1 tablet by mouth at bedtime.   11/15/2018 at Unknown time  . Cholecalciferol (VITAMIN D3) 50 MCG (2000 UT) TABS Take 2,000 Units by mouth at bedtime.   11/15/2018 at Unknown time  . clonazePAM (KLONOPIN) 0.5 MG tablet Take 1 mg by mouth at bedtime.   11/15/2018 at Unknown time  . diclofenac (VOLTAREN) 75 MG EC tablet Take 75 mg by mouth 2 (two) times daily.   11/16/2018 at Unknown time  . docusate sodium (COLACE) 100 MG capsule Take 100 mg by mouth every other day as needed (constipation.).   Past Week at Unknown time  . DULoxetine (CYMBALTA) 60 MG capsule Take 60 mg by mouth 2 (two) times daily.    11/16/2018 at Unknown time  . hydrochlorothiazide (HYDRODIURIL) 25 MG tablet Take 25 mg by mouth Daily.    11/16/2018 at Unknown time  . HYDROcodone-acetaminophen (NORCO) 10-325 MG tablet Take 1 tablet by mouth every 6 (six) hours as needed (pain).   0 11/16/2018 at Unknown time  . hydrOXYzine (ATARAX/VISTARIL) 25 MG tablet Take 25-50 mg by mouth at bedtime.   11/15/2018 at Unknown time  . KLOR-CON M20 20 MEQ tablet Take 20 mEq by mouth daily.    11/16/2018 at Unknown time  . lamoTRIgine (LAMICTAL) 200 MG tablet Take 200 mg by mouth at bedtime.     11/15/2018 at Unknown time  . metroNIDAZOLE (METROCREAM) 0.75 % cream Apply 1 application topically at bedtime.   11/15/2018 at Unknown time  . montelukast (SINGULAIR) 10 MG tablet Take 10 mg by mouth at bedtime.    11/15/2018 at Unknown time  . sennosides-docusate sodium (SENOKOT-S) 8.6-50 MG tablet Take 2 tablets by mouth every other day as needed for constipation.    Past Week at Unknown time  . HYDROcodone-homatropine (HYCODAN) 5-1.5 MG/5ML syrup Take 5 mLs by mouth every 6 (six) hours as needed for cough.   More than a month at Unknown time   Allergies  Allergen Reactions  . Latex Hives and Rash    Tape   (No problems with gloves)    Social History   Tobacco Use  . Smoking status: Former Smoker  Packs/day: 0.25    Years: 23.00    Pack years: 5.75    Last attempt to quit: 08/25/1993    Years since quitting: 25.2  . Smokeless tobacco: Never Used  Substance Use Topics  . Alcohol use: Yes    Alcohol/week: 10.0 standard drinks    Types: 10 Shots of liquor per week    Comment: none since christmas    Family History  Problem Relation Age of Onset  . Heart disease Mother   . Breast cancer Mother   . Cervical cancer Mother   . Diabetes Mother   . Heart disease Father   . Diabetes Maternal Aunt   . Colon cancer Neg Hx   . Esophageal cancer Neg Hx   . Rectal cancer Neg Hx   . Stomach cancer Neg Hx   . Liver cancer Neg Hx   . Pancreatic cancer Neg Hx      Review of Systems  Constitutional: Negative.   HENT: Positive for hearing loss.   Eyes: Negative.   Respiratory: Negative.   Cardiovascular: Negative.   Gastrointestinal: Negative.   Genitourinary: Negative.   Musculoskeletal: Positive for joint pain.  Skin: Negative.   Neurological: Negative.   Endo/Heme/Allergies: Negative.   Psychiatric/Behavioral: Positive for depression. The patient is nervous/anxious.     Objective:  Physical Exam  Constitutional: She is oriented to person, place, and time. She appears  well-developed and well-nourished.  HENT:  Head: Normocephalic and atraumatic.  Mouth/Throat: Oropharynx is clear and moist.  Eyes: Pupils are equal, round, and reactive to light. Conjunctivae are normal.  Neck: Neck supple.  Cardiovascular: Normal rate and regular rhythm.  Respiratory: Effort normal and breath sounds normal.  GI: Bowel sounds are normal.  Genitourinary:    Genitourinary Comments: Not pertinent to current symptomatology therefore not examined.   Musculoskeletal:     Comments:  She is independently ambulatory. She has a moderately antalgic gait. The left knee has a severe valgus deformity. Active range of motion of  0-120 degrees. There is 1+ crepitus and 1+ synovitis. Neurovascularly intact distally. The right knee has a well healed total  knee incision. Active range of motion is 0-110 degrees. No pain, swelling or deformity.   Neurological: She is alert and oriented to person, place, and time.  Skin: Skin is warm and dry.  Psychiatric: She has a normal mood and affect. Her behavior is normal.    Vital signs in last 24 hours: Pulse Rate:  [99] 99 (01/29 1400) BP: (182)/(82) 182/82 (01/29 1400) SpO2:  [94 %] 94 % (01/29 1400) Weight:  [74.9 kg] 74.9 kg (01/29 1400)  Labs:   Estimated body mass index is 31.21 kg/m as calculated from the following:   Height as of this encounter: 5\' 1"  (1.549 m).   Weight as of this encounter: 74.9 kg.   Imaging Review Plain radiographs demonstrate severe degenerative joint disease of the left knee(s). The overall alignment issignificant valgus. The bone quality appears to be good for age and reported activity level.   Preoperative templating of the joint replacement has been completed, documented, and submitted to the Operating Room personnel in order to optimize intra-operative equipment management.   Anticipated LOS equal to or greater than 2 midnights due to - Age 82 and older with one or more of the following:  -  Obesity  - Expected need for hospital services (PT, OT, Nursing) required for safe  discharge  - Anticipated need for postoperative skilled nursing care or inpatient rehab  -  Active co-morbidities: Chronic pain requiring opiods OR   - Unanticipated findings during/Post Surgery: None  - Patient is a high risk of re-admission due to: None     Assessment/Plan:  End stage arthritis, left knee  Principal Problem:   Primary localized osteoarthritis of left knee Active Problems:   Anxiety   GERD (gastroesophageal reflux disease)   Meniere disease   Hearing impaired   Essential hypertension, benign   Bipolar I disorder with depression, severe ([place])  The patient history, physical examination, clinical judgment of the provider and imaging studies are consistent with end stage degenerative joint disease of the left knee(s) and total knee arthroplasty is deemed medically necessary. The treatment options including medical management, injection therapy arthroscopy and arthroplasty were discussed at length. The risks and benefits of total knee arthroplasty were presented and reviewed. The risks due to aseptic loosening, infection, stiffness, patella tracking problems, thromboembolic complications and other imponderables were discussed. The patient acknowledged the explanation, agreed to proceed with the plan and consent was signed. Patient is being admitted for inpatient treatment for surgery, pain control, PT, OT, prophylactic antibiotics, VTE prophylaxis, progressive ambulation and ADL's and discharge planning. The patient is planning to be discharged home with home health services

## 2018-11-17 ENCOUNTER — Inpatient Hospital Stay (HOSPITAL_COMMUNITY)
Admission: RE | Admit: 2018-11-17 | Discharge: 2018-11-17 | Disposition: A | Discharge status: Home / Self Care | Payer: Medicare Other | Source: Ambulatory Visit

## 2018-11-17 DIAGNOSIS — L710 Perioral dermatitis: Secondary | ICD-10-CM | POA: Diagnosis not present

## 2018-11-17 DIAGNOSIS — L821 Other seborrheic keratosis: Secondary | ICD-10-CM | POA: Diagnosis not present

## 2018-11-17 DIAGNOSIS — L820 Inflamed seborrheic keratosis: Secondary | ICD-10-CM | POA: Diagnosis not present

## 2018-11-17 DIAGNOSIS — Z23 Encounter for immunization: Secondary | ICD-10-CM | POA: Diagnosis not present

## 2018-11-17 NOTE — Pre-Procedure Instructions (Signed)
JADAH BOBAK  11/17/2018      [address] [place] [address] Phone: [phone] Fax: [phone]    Your procedure is scheduled on February 10  Report to [facility].M.  Call this number if you have problems the morning of surgery:  [phone]   Remember:  Do not eat or drink after midnight.    Take these medicines the morning of surgery with A SIP OF WATER  acetaminophen (TYLENOL if needed buPROPion (WELLBUTRIN XL) DULoxetine (CYMBALTA) HYDROcodone-acetaminophen (NORCO)   7 days prior to surgery STOP taking any Aspirin (unless otherwise instructed by your surgeon), Aleve, Naproxen, Ibuprofen, Motrin, Advil, Goody's, BC's, all herbal medications, fish oil, and all vitamins.  Follow your surgeon's instructions on when to stop Asprin.  If no instructions were given by your surgeon then you will need to call the office to get those instructions.       Do not wear jewelry, make-up or nail polish.  Do not wear lotions, powders, or perfumes, or deodorant.  Do not shave 48 hours prior to surgery.   Do not bring valuables to the hospital.  [facility] is not responsible for any belongings or valuables.  Contacts, dentures or bridgework may not be worn into surgery.  Leave your suitcase in the car.  After surgery it may be brought to your room.  For patients admitted to the hospital, discharge time will be determined by your treatment team.  Patients discharged the day of surgery will not be allowed to drive home.   Special instructions:   [facility]- Preparing For Surgery  Before surgery, you can play an important role. Because skin is not sterile, your skin needs to be as free of germs as possible. You can reduce the number of germs on your skin by washing with CHG (chlorahexidine gluconate) Soap before surgery.  CHG is an antiseptic cleaner which kills germs  and bonds with the skin to continue killing germs even after washing.    Oral Hygiene is also important to reduce your risk of infection.  Remember - BRUSH YOUR TEETH THE MORNING OF SURGERY WITH YOUR REGULAR TOOTHPASTE  Please do not use if you have an allergy to CHG or antibacterial soaps. If your skin becomes reddened/irritated stop using the CHG.  Do not shave (including legs and underarms) for at least 48 hours prior to first CHG shower. It is OK to shave your face.  Please follow these instructions carefully.   1. Shower the NIGHT BEFORE SURGERY and the MORNING OF SURGERY with CHG.   2. If you chose to wash your hair, wash your hair first as usual with your normal shampoo.  3. After you shampoo, rinse your hair and body thoroughly to remove the shampoo.  4. Use CHG as you would any other liquid soap. You can apply CHG directly to the skin and wash gently with a scrungie or a clean washcloth.   5. Apply the CHG Soap to your body ONLY FROM THE NECK DOWN.  Do not use on open wounds or open sores. Avoid contact with your eyes, ears, mouth and genitals (private parts). Wash Face and genitals (private parts)  with your normal soap.  6. Wash thoroughly, paying special attention to the area where your surgery will be performed.  7. Thoroughly rinse your body with warm water from the neck down.  8. DO  NOT shower/wash with your normal soap after using and rinsing off the CHG Soap.  9. Pat yourself dry with a CLEAN TOWEL.  10. Wear CLEAN PAJAMAS to bed the night before surgery, wear comfortable clothes the morning of surgery  11. Place CLEAN SHEETS on your bed the night of your first shower and DO NOT SLEEP WITH PETS.    Day of Surgery:  Do not apply any deodorants/lotions.  Please wear clean clothes to the hospital/surgery center.   Remember to brush your teeth WITH YOUR REGULAR TOOTHPASTE.    Please read over the following fact sheets that you were given.

## 2018-11-18 ENCOUNTER — Ambulatory Visit (HOSPITAL_BASED_OUTPATIENT_CLINIC_OR_DEPARTMENT_OTHER): Payer: Medicare Other

## 2018-11-18 ENCOUNTER — Ambulatory Visit (HOSPITAL_COMMUNITY): Payer: Medicare Other | Attending: Cardiology

## 2018-11-18 DIAGNOSIS — I209 Angina pectoris, unspecified: Secondary | ICD-10-CM | POA: Diagnosis not present

## 2018-11-18 DIAGNOSIS — Z8249 Family history of ischemic heart disease and other diseases of the circulatory system: Secondary | ICD-10-CM | POA: Diagnosis not present

## 2018-11-18 DIAGNOSIS — Z01810 Encounter for preprocedural cardiovascular examination: Secondary | ICD-10-CM | POA: Diagnosis not present

## 2018-11-18 DIAGNOSIS — I10 Essential (primary) hypertension: Secondary | ICD-10-CM | POA: Insufficient documentation

## 2018-11-18 DIAGNOSIS — M5137 Other intervertebral disc degeneration, lumbosacral region: Secondary | ICD-10-CM | POA: Diagnosis not present

## 2018-11-18 DIAGNOSIS — M5416 Radiculopathy, lumbar region: Secondary | ICD-10-CM | POA: Diagnosis not present

## 2018-11-18 LAB — MYOCARDIAL PERFUSION IMAGING
CHL CUP NUCLEAR SRS: 0
LV dias vol: 44 mL (ref 46–106)
LV sys vol: 12 mL
Peak HR: 99 {beats}/min
Rest HR: 85 {beats}/min
SDS: 0
SSS: 0
TID: 1.05

## 2018-11-18 MED ORDER — TECHNETIUM TC 99M TETROFOSMIN IV KIT
32.7000 | PACK | Freq: Once | INTRAVENOUS | Status: AC | PRN
  Administered 2018-11-18: 32.7 via INTRAVENOUS
  Filled 2018-11-18: qty 33

## 2018-11-18 MED ORDER — REGADENOSON 0.4 MG/5ML IV SOLN
0.4000 mg | Freq: Once | INTRAVENOUS | Status: AC
  Administered 2018-11-18: 0.4 mg via INTRAVENOUS

## 2018-11-18 MED ORDER — TECHNETIUM TC 99M TETROFOSMIN IV KIT
10.3000 | PACK | Freq: Once | INTRAVENOUS | Status: AC | PRN
  Administered 2018-11-18: 10.3 via INTRAVENOUS
  Filled 2018-11-18: qty 11

## 2018-11-21 ENCOUNTER — Encounter (HOSPITAL_COMMUNITY)
Admission: RE | Admit: 2018-11-21 | Discharge: 2018-11-21 | Disposition: A | Discharge status: Home / Self Care | Payer: Medicare Other | Source: Ambulatory Visit | Attending: Orthopedic Surgery | Admitting: Orthopedic Surgery

## 2018-11-21 ENCOUNTER — Other Ambulatory Visit: Payer: Self-pay

## 2018-11-21 ENCOUNTER — Encounter (HOSPITAL_COMMUNITY): Payer: Self-pay

## 2018-11-21 DIAGNOSIS — E782 Mixed hyperlipidemia: Secondary | ICD-10-CM | POA: Insufficient documentation

## 2018-11-21 DIAGNOSIS — Z96611 Presence of right artificial shoulder joint: Secondary | ICD-10-CM | POA: Diagnosis not present

## 2018-11-21 DIAGNOSIS — F332 Major depressive disorder, recurrent severe without psychotic features: Secondary | ICD-10-CM | POA: Diagnosis not present

## 2018-11-21 DIAGNOSIS — F419 Anxiety disorder, unspecified: Secondary | ICD-10-CM | POA: Diagnosis not present

## 2018-11-21 DIAGNOSIS — Z8249 Family history of ischemic heart disease and other diseases of the circulatory system: Secondary | ICD-10-CM | POA: Insufficient documentation

## 2018-11-21 DIAGNOSIS — Z96612 Presence of left artificial shoulder joint: Secondary | ICD-10-CM | POA: Insufficient documentation

## 2018-11-21 DIAGNOSIS — Z01818 Encounter for other preprocedural examination: Secondary | ICD-10-CM | POA: Insufficient documentation

## 2018-11-21 DIAGNOSIS — I10 Essential (primary) hypertension: Secondary | ICD-10-CM | POA: Diagnosis not present

## 2018-11-21 DIAGNOSIS — Z87891 Personal history of nicotine dependence: Secondary | ICD-10-CM | POA: Diagnosis not present

## 2018-11-21 DIAGNOSIS — Z7982 Long term (current) use of aspirin: Secondary | ICD-10-CM | POA: Diagnosis not present

## 2018-11-21 DIAGNOSIS — M810 Age-related osteoporosis without current pathological fracture: Secondary | ICD-10-CM | POA: Insufficient documentation

## 2018-11-21 DIAGNOSIS — K219 Gastro-esophageal reflux disease without esophagitis: Secondary | ICD-10-CM | POA: Insufficient documentation

## 2018-11-21 DIAGNOSIS — Z96651 Presence of right artificial knee joint: Secondary | ICD-10-CM | POA: Diagnosis not present

## 2018-11-21 DIAGNOSIS — M1712 Unilateral primary osteoarthritis, left knee: Secondary | ICD-10-CM | POA: Insufficient documentation

## 2018-11-21 HISTORY — DX: Cochlear implant status: Z96.21

## 2018-11-21 HISTORY — DX: Prediabetes: R73.03

## 2018-11-21 HISTORY — DX: Dermatitis, unspecified: L30.9

## 2018-11-21 LAB — CBC WITH DIFFERENTIAL/PLATELET
Abs Immature Granulocytes: 0.03 10*3/uL (ref 0.00–0.07)
Basophils Absolute: 0.1 10*3/uL (ref 0.0–0.1)
Basophils Relative: 1 %
Eosinophils Absolute: 0.2 10*3/uL (ref 0.0–0.5)
Eosinophils Relative: 2 %
HCT: 49 % — ABNORMAL HIGH (ref 36.0–46.0)
Hemoglobin: 16 g/dL — ABNORMAL HIGH (ref 12.0–15.0)
Immature Granulocytes: 0 %
Lymphocytes Relative: 34 %
Lymphs Abs: 2.8 10*3/uL (ref 0.7–4.0)
MCH: 31.9 pg (ref 26.0–34.0)
MCHC: 32.7 g/dL (ref 30.0–36.0)
MCV: 97.8 fL (ref 80.0–100.0)
Monocytes Absolute: 0.7 10*3/uL (ref 0.1–1.0)
Monocytes Relative: 9 %
Neutro Abs: 4.5 10*3/uL (ref 1.7–7.7)
Neutrophils Relative %: 54 %
Platelets: 172 10*3/uL (ref 150–400)
RBC: 5.01 MIL/uL (ref 3.87–5.11)
RDW: 12.8 % (ref 11.5–15.5)
WBC: 8.3 10*3/uL (ref 4.0–10.5)
nRBC: 0 % (ref 0.0–0.2)

## 2018-11-21 LAB — COMPREHENSIVE METABOLIC PANEL
ALT: 95 U/L — ABNORMAL HIGH (ref 0–44)
ANION GAP: 13 (ref 5–15)
AST: 77 U/L — ABNORMAL HIGH (ref 15–41)
Albumin: 3.9 g/dL (ref 3.5–5.0)
Alkaline Phosphatase: 132 U/L — ABNORMAL HIGH (ref 38–126)
BUN: 18 mg/dL (ref 8–23)
CO2: 22 mmol/L (ref 22–32)
Calcium: 10.5 mg/dL — ABNORMAL HIGH (ref 8.9–10.3)
Chloride: 98 mmol/L (ref 98–111)
Creatinine, Ser: 0.69 mg/dL (ref 0.44–1.00)
GFR calc Af Amer: >60 mL/min (ref >60)
GFR calc non Af Amer: >60 mL/min (ref >60)
Glucose, Bld: 207 mg/dL — ABNORMAL HIGH (ref 70–99)
Potassium: 3.7 mmol/L (ref 3.5–5.1)
Sodium: 133 mmol/L — ABNORMAL LOW (ref 135–145)
Total Bilirubin: 0.2 mg/dL — ABNORMAL LOW (ref 0.3–1.2)
Total Protein: 7.3 g/dL (ref 6.5–8.1)

## 2018-11-21 LAB — SURGICAL PCR SCREEN
MRSA, PCR: NEGATIVE
Staphylococcus aureus: NEGATIVE

## 2018-11-21 LAB — PROTIME-INR
INR: 1.14
Prothrombin Time: 14.5 seconds (ref 11.4–15.2)

## 2018-11-21 LAB — TYPE AND SCREEN
ABO/RH(D): B POS
Antibody Screen: NEGATIVE

## 2018-11-21 LAB — APTT: aPTT: 33 seconds (ref 24–36)

## 2018-11-21 LAB — GLUCOSE, CAPILLARY: Glucose-Capillary: 310 mg/dL — ABNORMAL HIGH (ref 70–99)

## 2018-11-21 NOTE — Pre-Procedure Instructions (Signed)
KERYL GHOLSON  11/21/2018    Your procedure is scheduled on Monday, November 28, 2018 at 7:15 AM.   Report to [facility] Entrance "A" Admitting Office at 5:30 AM.   Call this number if you have problems the morning of surgery: [phone]   Questions prior to day of surgery, please call [phone] between 8 & 4 PM.   Remember:  Do not eat or drink after midnight Sunday, 11/27/18.  Take these medicines the morning of surgery with A SIP OF WATER: Bupropion (Wellbutrin), Duloxetine (Cymbalta), Hydrocodone or Tylenol - if needed.  Stop Aspirin as instructed by Cardiologist or Surgeon. Stop NSAIDS (Ibuprofen, Diclofenac, Aleve, etc) as of today. Do not use other Aspirin products or Herbal medications prior to surgery.    Do not wear jewelry, make-up or nail polish.  Do not wear lotions, powders, perfumes or deodorant.  Do not shave 48 hours prior to surgery.    Do not bring valuables to the hospital.  [facility] is not responsible for any belongings or valuables.  Contacts, dentures or bridgework may not be worn into surgery.  Leave your suitcase in the car.  After surgery it may be brought to your room.  For patients admitted to the hospital, discharge time will be determined by your treatment team.  [facility] - Preparing for Surgery  Before surgery, you can play an important role.  Because skin is not sterile, your skin needs to be as free of germs as possible.  You can reduce the number of germs on you skin by washing with CHG (chlorahexidine gluconate) soap before surgery.  CHG is an antiseptic cleaner which kills germs and bonds with the skin to continue killing germs even after washing.  Oral Hygiene is also important in reducing the risk of infection.  Remember to brush your teeth with your regular toothpaste the morning of surgery.  Please DO NOT use if you have an allergy to CHG or antibacterial soaps.  If your skin becomes reddened/irritated stop using the CHG and  inform your nurse when you arrive at Short Stay.  Do not shave (including legs and underarms) for at least 48 hours prior to the first CHG shower.  You may shave your face.  Please follow these instructions carefully:   1.  Shower with CHG Soap the night before surgery and the morning of Surgery.  2.  If you choose to wash your hair, wash your hair first as usual with your normal shampoo.  3.  After you shampoo, rinse your hair and body thoroughly to remove the shampoo. 4.  Use CHG as you would any other liquid soap.  You can apply chg directly to the skin and wash gently with a      scrungie or washcloth.           5.  Apply the CHG Soap to your body ONLY FROM THE NECK DOWN.   Do not use on open wounds or open sores. Avoid contact with your eyes, ears, mouth and genitals (private parts).  Wash genitals (private parts) with your normal soap.  6.  Wash thoroughly, paying special attention to the area where your surgery will be performed.  7.  Thoroughly rinse your body with warm water from the neck down.  8.  DO NOT shower/wash with your normal soap after using and rinsing off the CHG Soap.  9.  Pat yourself dry with a clean towel.  10.  Wear clean pajamas.            11.  Place clean sheets on your bed the night of your first shower and do not sleep with pets.  Day of Surgery   Shower as above. Do not apply any lotions/deodorants the morning of surgery.   Please wear clean clothes to the hospital. Remember to brush your teeth with toothpaste.    Please read over the fact sheets that you were given.

## 2018-11-21 NOTE — Progress Notes (Signed)
PCP - Dr. Elesa Hacker Cardiologist - Dr. Marlou Porch - has seen for SOB, no heart hx. LOV was 11/11/18.  To follow up as needed.   Chest x-ray - n/a EKG - 08/12/18 Stress Test - 11/18/18 ECHO - 11/18/18 Cardiac Cath - no  Sleep Study - in media dated 12/16/14 CPAP - no  Fasting Blood Sugar - 98-122 (pre-diabetic) Checks Blood Sugar _____ times a day (pt doesn't check every day A1C - 6.0 on 11/04/18 per pt.  Blood Thinner Instructions: n/a Aspirin Instructions: Pt states she was instructed to continue her Aspirin prior to surgery.  Anesthesia review: no  Patient denies shortness of breath, fever, cough and chest pain at PAT appointment   Patient verbalized understanding of instructions that were given to them at the PAT appointment. Patient was also instructed that they will need to review over the PAT instructions again at home before surgery.

## 2018-11-22 LAB — URINE CULTURE: Culture: NO GROWTH

## 2018-11-24 DIAGNOSIS — M25462 Effusion, left knee: Secondary | ICD-10-CM | POA: Diagnosis not present

## 2018-11-24 DIAGNOSIS — M5137 Other intervertebral disc degeneration, lumbosacral region: Secondary | ICD-10-CM | POA: Diagnosis not present

## 2018-11-25 MED ORDER — TRANEXAMIC ACID-NACL 1000-0.7 MG/100ML-% IV SOLN
1000.0000 mg | INTRAVENOUS | Status: AC
  Administered 2018-11-28: 1000 mg via INTRAVENOUS
  Filled 2018-11-25: qty 100

## 2018-11-25 MED ORDER — BUPIVACAINE LIPOSOME 1.3 % IJ SUSP
20.0000 mL | INTRAMUSCULAR | Status: DC
  Filled 2018-11-25: qty 20

## 2018-11-28 ENCOUNTER — Inpatient Hospital Stay (HOSPITAL_COMMUNITY)
Admission: RE | Admit: 2018-11-28 | Discharge: 2018-11-30 | DRG: 470 | Disposition: A | Discharge status: Home Health | Payer: Medicare Other | Attending: Orthopedic Surgery | Admitting: Orthopedic Surgery

## 2018-11-28 ENCOUNTER — Inpatient Hospital Stay (HOSPITAL_COMMUNITY): Payer: Medicare Other | Admitting: Certified Registered Nurse Anesthetist

## 2018-11-28 ENCOUNTER — Encounter (HOSPITAL_COMMUNITY)
Admission: RE | Disposition: A | Discharge status: Home Health | Payer: Self-pay | Source: Home / Self Care | Attending: Orthopedic Surgery

## 2018-11-28 ENCOUNTER — Encounter (HOSPITAL_COMMUNITY): Payer: Self-pay

## 2018-11-28 ENCOUNTER — Other Ambulatory Visit: Payer: Self-pay

## 2018-11-28 DIAGNOSIS — H9190 Unspecified hearing loss, unspecified ear: Secondary | ICD-10-CM | POA: Diagnosis present

## 2018-11-28 DIAGNOSIS — Z8249 Family history of ischemic heart disease and other diseases of the circulatory system: Secondary | ICD-10-CM | POA: Diagnosis not present

## 2018-11-28 DIAGNOSIS — F419 Anxiety disorder, unspecified: Secondary | ICD-10-CM | POA: Diagnosis present

## 2018-11-28 DIAGNOSIS — Z96612 Presence of left artificial shoulder joint: Secondary | ICD-10-CM | POA: Diagnosis present

## 2018-11-28 DIAGNOSIS — I10 Essential (primary) hypertension: Secondary | ICD-10-CM | POA: Diagnosis not present

## 2018-11-28 DIAGNOSIS — F319 Bipolar disorder, unspecified: Secondary | ICD-10-CM | POA: Diagnosis present

## 2018-11-28 DIAGNOSIS — M1712 Unilateral primary osteoarthritis, left knee: Secondary | ICD-10-CM | POA: Diagnosis not present

## 2018-11-28 DIAGNOSIS — Z96611 Presence of right artificial shoulder joint: Secondary | ICD-10-CM | POA: Diagnosis not present

## 2018-11-28 DIAGNOSIS — Z833 Family history of diabetes mellitus: Secondary | ICD-10-CM | POA: Diagnosis not present

## 2018-11-28 DIAGNOSIS — Z7982 Long term (current) use of aspirin: Secondary | ICD-10-CM | POA: Diagnosis not present

## 2018-11-28 DIAGNOSIS — Z79899 Other long term (current) drug therapy: Secondary | ICD-10-CM | POA: Diagnosis not present

## 2018-11-28 DIAGNOSIS — K219 Gastro-esophageal reflux disease without esophagitis: Secondary | ICD-10-CM | POA: Diagnosis not present

## 2018-11-28 DIAGNOSIS — Z90710 Acquired absence of both cervix and uterus: Secondary | ICD-10-CM | POA: Diagnosis not present

## 2018-11-28 DIAGNOSIS — Z8049 Family history of malignant neoplasm of other genital organs: Secondary | ICD-10-CM | POA: Diagnosis not present

## 2018-11-28 DIAGNOSIS — Z96651 Presence of right artificial knee joint: Secondary | ICD-10-CM | POA: Diagnosis present

## 2018-11-28 DIAGNOSIS — H8109 Meniere's disease, unspecified ear: Secondary | ICD-10-CM | POA: Diagnosis not present

## 2018-11-28 DIAGNOSIS — M797 Fibromyalgia: Secondary | ICD-10-CM | POA: Diagnosis present

## 2018-11-28 DIAGNOSIS — F314 Bipolar disorder, current episode depressed, severe, without psychotic features: Secondary | ICD-10-CM | POA: Diagnosis present

## 2018-11-28 DIAGNOSIS — G8918 Other acute postprocedural pain: Secondary | ICD-10-CM | POA: Diagnosis not present

## 2018-11-28 DIAGNOSIS — IMO0001 Reserved for inherently not codable concepts without codable children: Secondary | ICD-10-CM

## 2018-11-28 DIAGNOSIS — M21062 Valgus deformity, not elsewhere classified, left knee: Secondary | ICD-10-CM | POA: Diagnosis present

## 2018-11-28 DIAGNOSIS — M810 Age-related osteoporosis without current pathological fracture: Secondary | ICD-10-CM | POA: Diagnosis present

## 2018-11-28 DIAGNOSIS — Z87891 Personal history of nicotine dependence: Secondary | ICD-10-CM | POA: Diagnosis not present

## 2018-11-28 DIAGNOSIS — Z803 Family history of malignant neoplasm of breast: Secondary | ICD-10-CM

## 2018-11-28 DIAGNOSIS — Z91040 Latex allergy status: Secondary | ICD-10-CM

## 2018-11-28 DIAGNOSIS — Z96652 Presence of left artificial knee joint: Secondary | ICD-10-CM | POA: Diagnosis not present

## 2018-11-28 DIAGNOSIS — E782 Mixed hyperlipidemia: Secondary | ICD-10-CM | POA: Diagnosis present

## 2018-11-28 DIAGNOSIS — Z79891 Long term (current) use of opiate analgesic: Secondary | ICD-10-CM

## 2018-11-28 HISTORY — DX: Unilateral primary osteoarthritis, left knee: M17.12

## 2018-11-28 HISTORY — PX: TOTAL KNEE ARTHROPLASTY: SHX125

## 2018-11-28 LAB — GLUCOSE, CAPILLARY
GLUCOSE-CAPILLARY: 229 mg/dL — AB (ref 70–99)
Glucose-Capillary: 166 mg/dL — ABNORMAL HIGH (ref 70–99)
Glucose-Capillary: 165 mg/dL — ABNORMAL HIGH (ref 70–99)
Glucose-Capillary: 293 mg/dL — ABNORMAL HIGH (ref 70–99)
Glucose-Capillary: 212 mg/dL — ABNORMAL HIGH (ref 70–99)

## 2018-11-28 SURGERY — ARTHROPLASTY, KNEE, TOTAL
Anesthesia: Spinal | Site: Knee | Laterality: Left

## 2018-11-28 MED ORDER — ONDANSETRON HCL 4 MG/2ML IJ SOLN
INTRAMUSCULAR | Status: AC
  Filled 2018-11-28: qty 2

## 2018-11-28 MED ORDER — FENTANYL CITRATE (PF) 100 MCG/2ML IJ SOLN: 25.0000 ug | INTRAMUSCULAR | Status: DC | PRN

## 2018-11-28 MED ORDER — ROPIVACAINE HCL 5 MG/ML IJ SOLN
INTRAMUSCULAR | Status: DC | PRN
  Administered 2018-11-28: 25 mL via PERINEURAL

## 2018-11-28 MED ORDER — METRONIDAZOLE 0.75 % EX CREA
1.0000 "application " | TOPICAL_CREAM | Freq: Every day | CUTANEOUS | Status: DC
  Filled 2018-11-28: qty 45

## 2018-11-28 MED ORDER — DULOXETINE HCL 60 MG PO CPEP
60.0000 mg | ORAL_CAPSULE | Freq: Two times a day (BID) | ORAL | Status: DC
  Administered 2018-11-28 – 2018-11-30 (×4): 60 mg via ORAL
  Filled 2018-11-28 (×4): qty 1

## 2018-11-28 MED ORDER — CALCIUM CARBONATE ANTACID 500 MG PO CHEW: 1.0000 | CHEWABLE_TABLET | Freq: Three times a day (TID) | ORAL | Status: DC | PRN

## 2018-11-28 MED ORDER — FENTANYL CITRATE (PF) 250 MCG/5ML IJ SOLN
INTRAMUSCULAR | Status: AC
  Filled 2018-11-28: qty 5

## 2018-11-28 MED ORDER — ACETAMINOPHEN 500 MG PO TABS
1000.0000 mg | ORAL_TABLET | Freq: Four times a day (QID) | ORAL | Status: AC
  Administered 2018-11-28 – 2018-11-29 (×4): 1000 mg via ORAL
  Filled 2018-11-28 (×4): qty 2

## 2018-11-28 MED ORDER — INSULIN ASPART 100 UNIT/ML ~~LOC~~ SOLN
4.0000 [IU] | Freq: Three times a day (TID) | SUBCUTANEOUS | Status: DC
  Administered 2018-11-28 – 2018-11-30 (×6): 4 [IU] via SUBCUTANEOUS

## 2018-11-28 MED ORDER — MIDAZOLAM HCL 2 MG/2ML IJ SOLN
INTRAMUSCULAR | Status: AC
  Filled 2018-11-28: qty 2

## 2018-11-28 MED ORDER — PROPOFOL 500 MG/50ML IV EMUL
INTRAVENOUS | Status: DC | PRN
  Administered 2018-11-28: 100 ug/kg/min via INTRAVENOUS

## 2018-11-28 MED ORDER — VITAMIN D 25 MCG (1000 UNIT) PO TABS
2000.0000 [IU] | ORAL_TABLET | Freq: Every day | ORAL | Status: DC
  Administered 2018-11-28 – 2018-11-29 (×2): 2000 [IU] via ORAL
  Filled 2018-11-28 (×2): qty 2

## 2018-11-28 MED ORDER — POTASSIUM CHLORIDE IN NACL 20-0.9 MEQ/L-% IV SOLN
INTRAVENOUS | Status: DC
  Administered 2018-11-28: 14:00:00 via INTRAVENOUS
  Filled 2018-11-28: qty 1000

## 2018-11-28 MED ORDER — METOCLOPRAMIDE HCL 5 MG/ML IJ SOLN: 5.0000 mg | Freq: Three times a day (TID) | INTRAMUSCULAR | Status: DC | PRN

## 2018-11-28 MED ORDER — OXYCODONE HCL 5 MG PO TABS
5.0000 mg | ORAL_TABLET | ORAL | Status: DC | PRN
  Administered 2018-11-28 – 2018-11-29 (×7): 10 mg via ORAL
  Filled 2018-11-28 (×7): qty 2

## 2018-11-28 MED ORDER — SODIUM CHLORIDE 0.9 % IR SOLN
Status: DC | PRN
  Administered 2018-11-28: 3000 mL

## 2018-11-28 MED ORDER — CEFAZOLIN SODIUM-DEXTROSE 2-4 GM/100ML-% IV SOLN
2.0000 g | Freq: Four times a day (QID) | INTRAVENOUS | Status: AC
  Administered 2018-11-28 (×2): 2 g via INTRAVENOUS
  Filled 2018-11-28 (×2): qty 100

## 2018-11-28 MED ORDER — ONDANSETRON HCL 4 MG/2ML IJ SOLN
INTRAMUSCULAR | Status: DC | PRN
  Administered 2018-11-28: 4 mg via INTRAVENOUS

## 2018-11-28 MED ORDER — OXYCODONE HCL 5 MG/5ML PO SOLN: 5.0000 mg | Freq: Once | ORAL | Status: DC | PRN

## 2018-11-28 MED ORDER — METOCLOPRAMIDE HCL 5 MG PO TABS: 5.0000 mg | ORAL_TABLET | Freq: Three times a day (TID) | ORAL | Status: DC | PRN

## 2018-11-28 MED ORDER — MONTELUKAST SODIUM 10 MG PO TABS
10.0000 mg | ORAL_TABLET | Freq: Every day | ORAL | Status: DC
  Administered 2018-11-28 – 2018-11-29 (×2): 10 mg via ORAL
  Filled 2018-11-28 (×2): qty 1

## 2018-11-28 MED ORDER — INSULIN ASPART 100 UNIT/ML ~~LOC~~ SOLN
0.0000 [IU] | Freq: Three times a day (TID) | SUBCUTANEOUS | Status: DC
  Administered 2018-11-28: 5 [IU] via SUBCUTANEOUS
  Administered 2018-11-28: 8 [IU] via SUBCUTANEOUS
  Administered 2018-11-29: 3 [IU] via SUBCUTANEOUS
  Administered 2018-11-29: 5 [IU] via SUBCUTANEOUS
  Administered 2018-11-29: 8 [IU] via SUBCUTANEOUS
  Administered 2018-11-30: 2 [IU] via SUBCUTANEOUS
  Administered 2018-11-30: 3 [IU] via SUBCUTANEOUS

## 2018-11-28 MED ORDER — CEFAZOLIN SODIUM-DEXTROSE 2-4 GM/100ML-% IV SOLN
INTRAVENOUS | Status: AC
  Filled 2018-11-28: qty 100

## 2018-11-28 MED ORDER — ASPIRIN EC 325 MG PO TBEC
325.0000 mg | DELAYED_RELEASE_TABLET | Freq: Every day | ORAL | Status: DC
  Administered 2018-11-29 – 2018-11-30 (×2): 325 mg via ORAL
  Filled 2018-11-28 (×2): qty 1

## 2018-11-28 MED ORDER — BUPIVACAINE IN DEXTROSE 0.75-8.25 % IT SOLN
INTRATHECAL | Status: DC | PRN
  Administered 2018-11-28: 1.8 mL via INTRATHECAL

## 2018-11-28 MED ORDER — BUPROPION HCL ER (XL) 150 MG PO TB24
300.0000 mg | ORAL_TABLET | Freq: Every day | ORAL | Status: DC
  Administered 2018-11-29 – 2018-11-30 (×2): 300 mg via ORAL
  Filled 2018-11-28 (×2): qty 2

## 2018-11-28 MED ORDER — POVIDONE-IODINE 7.5 % EX SOLN
Freq: Once | CUTANEOUS | Status: DC
  Filled 2018-11-28: qty 118

## 2018-11-28 MED ORDER — POLYETHYLENE GLYCOL 3350 17 G PO PACK
17.0000 g | PACK | Freq: Two times a day (BID) | ORAL | Status: DC
  Administered 2018-11-28 – 2018-11-30 (×4): 17 g via ORAL
  Filled 2018-11-28 (×4): qty 1

## 2018-11-28 MED ORDER — FENTANYL CITRATE (PF) 100 MCG/2ML IJ SOLN
INTRAMUSCULAR | Status: DC | PRN
  Administered 2018-11-28 (×2): 25 ug via INTRAVENOUS

## 2018-11-28 MED ORDER — DEXAMETHASONE SODIUM PHOSPHATE 10 MG/ML IJ SOLN
10.0000 mg | Freq: Three times a day (TID) | INTRAMUSCULAR | Status: AC
  Administered 2018-11-28 – 2018-11-29 (×4): 10 mg via INTRAVENOUS
  Filled 2018-11-28 (×3): qty 1

## 2018-11-28 MED ORDER — MIDAZOLAM HCL 5 MG/5ML IJ SOLN
INTRAMUSCULAR | Status: DC | PRN
  Administered 2018-11-28: 2 mg via INTRAVENOUS

## 2018-11-28 MED ORDER — DOCUSATE SODIUM 100 MG PO CAPS
100.0000 mg | ORAL_CAPSULE | Freq: Two times a day (BID) | ORAL | Status: DC
  Administered 2018-11-28 – 2018-11-30 (×4): 100 mg via ORAL
  Filled 2018-11-28 (×4): qty 1

## 2018-11-28 MED ORDER — LACTATED RINGERS IV SOLN
INTRAVENOUS | Status: DC | PRN
  Administered 2018-11-28: 07:00:00 via INTRAVENOUS

## 2018-11-28 MED ORDER — DEXAMETHASONE SODIUM PHOSPHATE 10 MG/ML IJ SOLN
INTRAMUSCULAR | Status: AC
  Filled 2018-11-28: qty 1

## 2018-11-28 MED ORDER — LACTATED RINGERS IV SOLN: INTRAVENOUS | Status: DC

## 2018-11-28 MED ORDER — PHENOL 1.4 % MT LIQD: 1.0000 | OROMUCOSAL | Status: DC | PRN

## 2018-11-28 MED ORDER — TRANEXAMIC ACID-NACL 1000-0.7 MG/100ML-% IV SOLN
1000.0000 mg | Freq: Once | INTRAVENOUS | Status: AC
  Administered 2018-11-28: 1000 mg via INTRAVENOUS
  Filled 2018-11-28: qty 100

## 2018-11-28 MED ORDER — CEFAZOLIN SODIUM-DEXTROSE 2-4 GM/100ML-% IV SOLN
2.0000 g | INTRAVENOUS | Status: AC
  Administered 2018-11-28: 2 g via INTRAVENOUS

## 2018-11-28 MED ORDER — ATORVASTATIN CALCIUM 10 MG PO TABS
10.0000 mg | ORAL_TABLET | Freq: Every day | ORAL | Status: DC
  Administered 2018-11-28 – 2018-11-29 (×2): 10 mg via ORAL
  Filled 2018-11-28 (×2): qty 1

## 2018-11-28 MED ORDER — DEXAMETHASONE SODIUM PHOSPHATE 10 MG/ML IJ SOLN
INTRAMUSCULAR | Status: DC | PRN
  Administered 2018-11-28: 5 mg via INTRAVENOUS

## 2018-11-28 MED ORDER — ASENAPINE MALEATE 5 MG SL SUBL
10.0000 mg | SUBLINGUAL_TABLET | Freq: Every day | SUBLINGUAL | Status: DC
  Administered 2018-11-28 – 2018-11-29 (×2): 10 mg via SUBLINGUAL
  Filled 2018-11-28 (×3): qty 2

## 2018-11-28 MED ORDER — GABAPENTIN 300 MG PO CAPS
300.0000 mg | ORAL_CAPSULE | Freq: Every day | ORAL | Status: DC
  Administered 2018-11-28 – 2018-11-29 (×2): 300 mg via ORAL
  Filled 2018-11-28 (×2): qty 1

## 2018-11-28 MED ORDER — PROPOFOL 1000 MG/100ML IV EMUL
INTRAVENOUS | Status: AC
  Filled 2018-11-28: qty 100

## 2018-11-28 MED ORDER — MENTHOL 3 MG MT LOZG: 1.0000 | LOZENGE | OROMUCOSAL | Status: DC | PRN

## 2018-11-28 MED ORDER — LAMOTRIGINE 100 MG PO TABS
200.0000 mg | ORAL_TABLET | Freq: Every day | ORAL | Status: DC
  Administered 2018-11-28 – 2018-11-29 (×2): 200 mg via ORAL
  Filled 2018-11-28 (×2): qty 2

## 2018-11-28 MED ORDER — DIPHENHYDRAMINE HCL 12.5 MG/5ML PO ELIX: 12.5000 mg | ORAL_SOLUTION | ORAL | Status: DC | PRN

## 2018-11-28 MED ORDER — HYDROXYZINE HCL 25 MG PO TABS
25.0000 mg | ORAL_TABLET | Freq: Every day | ORAL | Status: DC
  Administered 2018-11-28: 25 mg via ORAL
  Administered 2018-11-29: 50 mg via ORAL
  Filled 2018-11-28: qty 2
  Filled 2018-11-28: qty 1

## 2018-11-28 MED ORDER — BUPIVACAINE-EPINEPHRINE 0.5% -1:200000 IJ SOLN
INTRAMUSCULAR | Status: DC | PRN
  Administered 2018-11-28: 50 mL

## 2018-11-28 MED ORDER — 0.9 % SODIUM CHLORIDE (POUR BTL) OPTIME
TOPICAL | Status: DC | PRN
  Administered 2018-11-28: 1000 mL

## 2018-11-28 MED ORDER — ONDANSETRON HCL 4 MG PO TABS: 4.0000 mg | ORAL_TABLET | Freq: Four times a day (QID) | ORAL | Status: DC | PRN

## 2018-11-28 MED ORDER — CLONAZEPAM 1 MG PO TABS
1.0000 mg | ORAL_TABLET | Freq: Every day | ORAL | Status: DC
  Administered 2018-11-28 – 2018-11-29 (×2): 1 mg via ORAL
  Filled 2018-11-28 (×2): qty 1

## 2018-11-28 MED ORDER — CHLORHEXIDINE GLUCONATE 4 % EX LIQD: 60.0000 mL | Freq: Once | CUTANEOUS | Status: DC

## 2018-11-28 MED ORDER — BUPIVACAINE LIPOSOME 1.3 % IJ SUSP
INTRAMUSCULAR | Status: DC | PRN
  Administered 2018-11-28: 20 mL

## 2018-11-28 MED ORDER — HYDROMORPHONE HCL 1 MG/ML IJ SOLN
0.5000 mg | INTRAMUSCULAR | Status: DC | PRN
  Administered 2018-11-28: 1 mg via INTRAVENOUS
  Administered 2018-11-28: 0.5 mg via INTRAVENOUS
  Administered 2018-11-29 – 2018-11-30 (×4): 1 mg via INTRAVENOUS
  Filled 2018-11-28 (×6): qty 1

## 2018-11-28 MED ORDER — ALUM & MAG HYDROXIDE-SIMETH 200-200-20 MG/5ML PO SUSP: 30.0000 mL | ORAL | Status: DC | PRN

## 2018-11-28 MED ORDER — METRONIDAZOLE 0.75 % EX GEL
Freq: Every day | CUTANEOUS | Status: DC
  Administered 2018-11-28: via TOPICAL
  Filled 2018-11-28: qty 45

## 2018-11-28 MED ORDER — INSULIN ASPART 100 UNIT/ML ~~LOC~~ SOLN
0.0000 [IU] | Freq: Every day | SUBCUTANEOUS | Status: DC
  Administered 2018-11-28: 2 [IU] via SUBCUTANEOUS

## 2018-11-28 MED ORDER — CALCIUM 1200 1200-1000 MG-UNIT PO CHEW: 1.0000 | CHEWABLE_TABLET | Freq: Every day | ORAL | Status: DC

## 2018-11-28 MED ORDER — OXYCODONE HCL 5 MG PO TABS: 5.0000 mg | ORAL_TABLET | Freq: Once | ORAL | Status: DC | PRN

## 2018-11-28 MED ORDER — ONDANSETRON HCL 4 MG/2ML IJ SOLN: 4.0000 mg | Freq: Four times a day (QID) | INTRAMUSCULAR | Status: DC | PRN

## 2018-11-28 MED ORDER — CALCIUM CARBONATE-VITAMIN D 500-200 MG-UNIT PO TABS
1.0000 | ORAL_TABLET | Freq: Two times a day (BID) | ORAL | Status: DC
  Administered 2018-11-28 – 2018-11-30 (×4): 1 via ORAL
  Filled 2018-11-28 (×4): qty 1

## 2018-11-28 MED ORDER — INSULIN GLARGINE 100 UNIT/ML ~~LOC~~ SOLN
20.0000 [IU] | Freq: Every day | SUBCUTANEOUS | Status: DC
  Administered 2018-11-28 – 2018-11-29 (×2): 20 [IU] via SUBCUTANEOUS
  Filled 2018-11-28 (×3): qty 0.2

## 2018-11-28 MED ORDER — SODIUM CHLORIDE 0.9 % IV SOLN
INTRAVENOUS | Status: DC | PRN
  Administered 2018-11-28: 25 ug/min via INTRAVENOUS

## 2018-11-28 MED ORDER — SODIUM CHLORIDE (PF) 0.9 % IJ SOLN
INTRAMUSCULAR | Status: DC | PRN
  Administered 2018-11-28: 50 mL via INTRAVENOUS

## 2018-11-28 SURGICAL SUPPLY — 74 items
ATTUNE MED DOME PAT 32 KNEE (Knees) ×2 IMPLANT
ATTUNE PS FEM LT SZ 5 CEM KNEE (Femur) ×2 IMPLANT
ATTUNE PSRP INSR SZ5 8 KNEE (Insert) ×2 IMPLANT
BANDAGE ESMARK 6X9 LF (GAUZE/BANDAGES/DRESSINGS) ×1 IMPLANT
BASE TIBIAL ROT PLAT SZ 5 KNEE (Knees) ×1 IMPLANT
BENZOIN TINCTURE PRP APPL 2/3 (GAUZE/BANDAGES/DRESSINGS) ×2 IMPLANT
BLADE SAGITTAL 25.0X1.19X90 (BLADE) ×2 IMPLANT
BLADE SAW SGTL 13X75X1.27 (BLADE) ×2 IMPLANT
BLADE SURG 10 STRL SS (BLADE) ×4 IMPLANT
BNDG ELASTIC 6X15 VLCR STRL LF (GAUZE/BANDAGES/DRESSINGS) ×2 IMPLANT
BNDG ESMARK 6X9 LF (GAUZE/BANDAGES/DRESSINGS) ×2
BOWL SMART MIX CTS (DISPOSABLE) ×2 IMPLANT
CEMENT HV SMART SET (Cement) ×4 IMPLANT
COVER SURGICAL LIGHT HANDLE (MISCELLANEOUS) ×2 IMPLANT
COVER WAND RF STERILE (DRAPES) IMPLANT
CUFF TOURNIQUET SINGLE 34IN LL (TOURNIQUET CUFF) ×2 IMPLANT
CUFF TOURNIQUET SINGLE 44IN (TOURNIQUET CUFF) IMPLANT
DECANTER SPIKE VIAL GLASS SM (MISCELLANEOUS) ×2 IMPLANT
DRAPE EXTREMITY T 121X128X90 (DISPOSABLE) ×2 IMPLANT
DRAPE HALF SHEET 40X57 (DRAPES) ×4 IMPLANT
DRAPE INCISE IOBAN 66X45 STRL (DRAPES) IMPLANT
DRAPE ORTHO SPLIT 77X108 STRL (DRAPES) ×1
DRAPE SURG ORHT 6 SPLT 77X108 (DRAPES) ×1 IMPLANT
DRAPE U-SHAPE 47X51 STRL (DRAPES) ×2 IMPLANT
DRSG AQUACEL AG ADV 3.5X10 (GAUZE/BANDAGES/DRESSINGS) ×2 IMPLANT
DURAPREP 26ML APPLICATOR (WOUND CARE) ×4 IMPLANT
ELECT CAUTERY BLADE 6.4 (BLADE) ×2 IMPLANT
ELECT REM PT RETURN 9FT ADLT (ELECTROSURGICAL) ×2
ELECTRODE REM PT RTRN 9FT ADLT (ELECTROSURGICAL) ×1 IMPLANT
FACESHIELD WRAPAROUND (MASK) ×2 IMPLANT
GLOVE BIO SURGEON STRL SZ7 (GLOVE) ×2 IMPLANT
GLOVE BIOGEL PI IND STRL 7.0 (GLOVE) ×1 IMPLANT
GLOVE BIOGEL PI IND STRL 7.5 (GLOVE) ×1 IMPLANT
GLOVE BIOGEL PI INDICATOR 7.0 (GLOVE) ×1
GLOVE BIOGEL PI INDICATOR 7.5 (GLOVE) ×1
GLOVE SS BIOGEL STRL SZ 7.5 (GLOVE) ×1 IMPLANT
GLOVE SUPERSENSE BIOGEL SZ 7.5 (GLOVE) ×1
GOWN STRL REUS W/ TWL LRG LVL3 (GOWN DISPOSABLE) ×3 IMPLANT
GOWN STRL REUS W/ TWL XL LVL3 (GOWN DISPOSABLE) ×1 IMPLANT
GOWN STRL REUS W/TWL LRG LVL3 (GOWN DISPOSABLE) ×3
GOWN STRL REUS W/TWL XL LVL3 (GOWN DISPOSABLE) ×1
HANDPIECE INTERPULSE COAX TIP (DISPOSABLE) ×1
HOOD PEEL AWAY FACE SHEILD DIS (HOOD) ×4 IMPLANT
IMMOBILIZER KNEE 20 (SOFTGOODS) ×2
IMMOBILIZER KNEE 20 THIGH 36 (SOFTGOODS) ×1 IMPLANT
KIT BASIN OR (CUSTOM PROCEDURE TRAY) ×2 IMPLANT
KIT TURNOVER KIT B (KITS) ×2 IMPLANT
MANIFOLD NEPTUNE II (INSTRUMENTS) ×2 IMPLANT
MARKER SKIN DUAL TIP RULER LAB (MISCELLANEOUS) ×2 IMPLANT
NEEDLE HYPO 22GX1.5 SAFETY (NEEDLE) ×4 IMPLANT
NS IRRIG 1000ML POUR BTL (IV SOLUTION) ×2 IMPLANT
PACK TOTAL JOINT (CUSTOM PROCEDURE TRAY) ×2 IMPLANT
PAD ARMBOARD 7.5X6 YLW CONV (MISCELLANEOUS) ×4 IMPLANT
PIN STEINMAN FIXATION KNEE (PIN) ×2 IMPLANT
PIN THREADED HEADED SIGMA (PIN) ×2 IMPLANT
SET HNDPC FAN SPRY TIP SCT (DISPOSABLE) ×1 IMPLANT
STRIP CLOSURE SKIN 1/2X4 (GAUZE/BANDAGES/DRESSINGS) ×2 IMPLANT
SUCTION FRAZIER HANDLE 10FR (MISCELLANEOUS) ×1
SUCTION TUBE FRAZIER 10FR DISP (MISCELLANEOUS) ×1 IMPLANT
SUT MNCRL AB 3-0 PS2 18 (SUTURE) ×2 IMPLANT
SUT VIC AB 0 CT1 27 (SUTURE) ×2
SUT VIC AB 0 CT1 27XBRD ANBCTR (SUTURE) ×2 IMPLANT
SUT VIC AB 1 CT1 27 (SUTURE) ×1
SUT VIC AB 1 CT1 27XBRD ANBCTR (SUTURE) ×1 IMPLANT
SUT VIC AB 2-0 CT1 27 (SUTURE) ×2
SUT VIC AB 2-0 CT1 TAPERPNT 27 (SUTURE) ×2 IMPLANT
SYR CONTROL 10ML LL (SYRINGE) ×4 IMPLANT
TIBIAL BASE ROT PLAT SZ 5 KNEE (Knees) ×2 IMPLANT
TOWEL OR 17X24 6PK STRL BLUE (TOWEL DISPOSABLE) ×2 IMPLANT
TOWEL OR 17X26 10 PK STRL BLUE (TOWEL DISPOSABLE) ×2 IMPLANT
TRAY CATH 16FR W/PLASTIC CATH (SET/KITS/TRAYS/PACK) IMPLANT
TRAY FOLEY CATH SILVER 16FR (SET/KITS/TRAYS/PACK) IMPLANT
WATER STERILE IRR 1000ML POUR (IV SOLUTION) ×2 IMPLANT
YANKAUER SUCT BULB TIP NO VENT (SUCTIONS) ×2 IMPLANT

## 2018-11-28 NOTE — Progress Notes (Signed)
Physical Therapy Evaluation Patient Details Name: Elizabeth Stone MRN: [id number] DOB: 1946/05/06 Today's Date: 11/28/2018   History of Present Illness  Patient is 73 y/o female s/p L TKA. PMH includes anxiety, GERD, HTN, meniere's disease, bipolar disorder, hearing impaired, osteoporosis, and hx of R TKA.   Clinical Impression  Patient admitted to hospital secondary to problems above and with deficits below. Patient required min guard to minA to stand and transfer to chair with use of RW. Educated and reviewed supine HEP and knee precautions with patient. Patient will benefit from acute physical therapy services to maximize independence and safety with functional mobility.     Follow Up Recommendations Follow surgeon's recommendation for DC plan and follow-up therapies    Equipment Recommendations  3in1 (PT)    Recommendations for Other Services       Precautions / Restrictions Precautions Precautions: Fall;Knee Precaution Booklet Issued: Yes (comment) Precaution Comments: reviewed knee precautions with patient Restrictions Weight Bearing Restrictions: Yes LLE Weight Bearing: Weight bearing as tolerated      Mobility  Bed Mobility Overal bed mobility: Needs Assistance Bed Mobility: Supine to Sit     Supine to sit: Supervision     General bed mobility comments: Patient required supervision to sit EOB for safety. Required increased time to sit EOB. No dizziness upon sitting  Transfers Overall transfer level: Needs assistance Equipment used: Rolling walker (2 wheeled) Transfers: Sit to/from [facility] Sit to Stand: Min guard;Min assist Stand pivot transfers: Min guard;Min assist       General transfer comment: Patient required min guard to minA to stand and transfer to chair with use of RW for safety. MinA needed for lift assist to stand and steadying throughout transfer. Increased time required to reach full standing posture. Verbal cues for handplacment  on RW and sequencing during transfer.   Ambulation/Gait                Stairs            Wheelchair Mobility    Modified Rankin (Stroke Patients Only)       Balance Overall balance assessment: Needs assistance Sitting-balance support: Feet supported;No upper extremity supported Sitting balance-Leahy Scale: Good     Standing balance support: Bilateral upper extremity supported Standing balance-Leahy Scale: Poor Standing balance comment: reliant on BUE support and use of RW to maintain standing balance                             Pertinent Vitals/Pain Pain Assessment: 0-10 Pain Score: 5  Pain Location: L knee Pain Descriptors / Indicators: Grimacing;Guarding;Discomfort;Operative site guarding Pain Intervention(s): Limited activity within patient's tolerance;Monitored during session;Repositioned;Ice applied    Home Living Family/patient expects to be discharged to:: Private residence Living Arrangements: Alone Available Help at Discharge: Family;Friend(s);Available 24 hours/day Type of Home: Other(Comment)(condo) Home Access: Stairs to enter Entrance Stairs-Rails: Right;Left Entrance Stairs-Number of Steps: 8 Home Layout: One level Home Equipment: Walker - 2 wheels;Cane - single point;Shower seat Additional Comments: Patient reports living alone but friend and son are going to stay with her first few days following discharge.    Prior Function Level of Independence: Independent               Hand Dominance        Extremity/Trunk Assessment   Upper Extremity Assessment Upper Extremity Assessment: Overall WFL for tasks assessed    Lower Extremity Assessment Lower Extremity Assessment: LLE deficits/detail LLE  Deficits / Details: LLE deficits consistent with post op pain and weakness    Cervical / Trunk Assessment Cervical / Trunk Assessment: Normal  Communication   Communication: HOH(impaired hearing)  Cognition  Arousal/Alertness: Awake/alert Behavior During Therapy: WFL for tasks assessed/performed Overall Cognitive Status: Within Functional Limits for tasks assessed                                        General Comments General comments (skin integrity, edema, etc.): Patient with difficulty hearing and has cochlear implant     Exercises Total Joint Exercises Ankle Circles/Pumps: AROM;Both;20 reps;Supine Quad Sets: AROM;Left;10 reps;Supine Heel Slides: AROM;Left;10 reps;Supine   Assessment/Plan    PT Assessment Patient needs continued PT services  PT Problem List Decreased strength;Decreased range of motion;Decreased activity tolerance;Decreased balance;Decreased mobility;Decreased knowledge of use of DME;Decreased knowledge of precautions;Pain       PT Treatment Interventions DME instruction;Gait training;Stair training;Functional mobility training;Therapeutic activities;Therapeutic exercise;Balance training;Patient/family education    PT Goals (Current goals can be found in the Care Plan section)  Acute Rehab PT Goals Patient Stated Goal: have less pain PT Goal Formulation: With patient Time For Goal Achievement: 12/12/18 Potential to Achieve Goals: Good    Frequency 7X/week   Barriers to discharge        Co-evaluation               AM-PAC PT "6 Clicks" Mobility  Outcome Measure Help needed turning from your back to your side while in a flat bed without using bedrails?: None Help needed moving from lying on your back to sitting on the side of a flat bed without using bedrails?: A Little Help needed moving to and from a bed to a chair (including a wheelchair)?: A Little Help needed standing up from a chair using your arms (e.g., wheelchair or bedside chair)?: A Little Help needed to walk in hospital room?: A Little Help needed climbing 3-5 steps with a railing? : A Lot 6 Click Score: 18    End of Session Equipment Utilized During Treatment: Gait  belt Activity Tolerance: Patient limited by pain Patient left: in chair;with call bell/phone within reach Nurse Communication: Mobility status PT Visit Diagnosis: Unsteadiness on feet (R26.81);Muscle weakness (generalized) (M62.81);Difficulty in walking, not elsewhere classified (R26.2)    Time: 0092-3300 PT Time Calculation (min) (ACUTE ONLY): 36 min   Charges:   PT Evaluation $PT Eval Low Complexity: 1 Low PT Treatments $Therapeutic Activity: 8-22 mins        Erick Blinks, SPT  Erick Blinks 11/28/2018, 4:44 PM

## 2018-11-28 NOTE — Anesthesia Procedure Notes (Signed)
Spinal  Patient location during procedure: OR Start time: 11/28/2018 7:22 AM End time: 11/28/2018 7:24 AM Staffing Anesthesiologist: Albertha Ghee, MD Performed: anesthesiologist  Preanesthetic Checklist Completed: patient identified, surgical consent, pre-op evaluation, timeout performed, IV checked, risks and benefits discussed and monitors and equipment checked Spinal Block Patient position: sitting Prep: DuraPrep Patient monitoring: cardiac monitor, continuous pulse ox and blood pressure Approach: midline Location: L3-4 Injection technique: single-shot Needle Needle type: Pencan  Needle gauge: 24 G Needle length: 9 cm Assessment Sensory level: T10 Additional Notes Functioning IV was confirmed and monitors were applied. Sterile prep and drape, including hand hygiene and sterile gloves were used. The patient was positioned and the spine was prepped. The skin was anesthetized with lidocaine.  Free flow of clear CSF was obtained prior to injecting local anesthetic into the CSF.  The spinal needle aspirated freely following injection.  The needle was carefully withdrawn.  The patient tolerated the procedure well.

## 2018-11-28 NOTE — Interval H&P Note (Signed)
History and Physical Interval Note:  11/28/2018 7:01 AM  Elizabeth Stone  has presented today for surgery, with the diagnosis of djd left knee  The various methods of treatment have been discussed with the patient and family. After consideration of risks, benefits and other options for treatment, the patient has consented to  Procedure(s): TOTAL KNEE ARTHROPLASTY (Left) as a surgical intervention .  The patient's history has been reviewed, patient examined, no change in status, stable for surgery.  I have reviewed the patient's chart and labs.  Questions were answered to the patient's satisfaction.     [name]

## 2018-11-28 NOTE — Progress Notes (Signed)
Orthopedic Tech Progress Note Patient Details:  Elizabeth Stone 09/17/1946 [id number]  CPM Left Knee CPM Left Knee: On Left Knee Flexion (Degrees): 0 Left Knee Extension (Degrees): 90  Post Interventions Patient Tolerated: Well Instructions Provided: Care of device, Adjustment of device Ortho Devices Type of Ortho Device: Bone foam zero knee, Knee Immobilizer   Post Interventions Patient Tolerated: Well Instructions Provided: Care of device, Adjustment of device   [name] 11/28/2018, 10:47 AM

## 2018-11-28 NOTE — Op Note (Signed)
MRN:     [id number] DOB/AGE:    Oct 04, 1946 / 73 y.o.       OPERATIVE REPORT   DATE OF PROCEDURE:  11/28/2018      PREOPERATIVE DIAGNOSIS:   Primary Localized Osteoarthritis left Knee       Estimated body mass index is 29.84 kg/m as calculated from the following:   Height as of this encounter: 5' 1.5" (1.562 m).   Weight as of this encounter: 72.8 kg.                                                       POSTOPERATIVE DIAGNOSIS:   Same                                                                 PROCEDURE:  Procedure(s): LEFT TOTAL KNEE ARTHROPLASTY Using Depuy Attune RP implants #5 Femur, #5Tibia, 61mm  RP bearing, 32 Patella    SURGEON: Yui Mulvaney A. Noemi Chapel, MD   ASSISTANT: Matthew Saras, PA-C, present and scrubbed throughout the case, critical for retraction, instrumentation, and closure.  ANESTHESIA: Spinal with Adductor Nerve Block  TOURNIQUET TIME: 55 minutes   COMPLICATIONS:  None       SPECIMENS: None   INDICATIONS FOR PROCEDURE: The patient has djd of the knee with valgus deformities, XR shows bone on bone arthritis. Patient has failed all conservative measures including anti-inflammatory medicines, narcotics, attempts at exercise and weight loss, cortisone injections and viscosupplementation.  Risks and benefits of surgery have been discussed, questions answered.    DESCRIPTION OF PROCEDURE: The patient identified by armband, received right adductor canal block and IV antibiotics, in the holding area at [facility]. Patient taken to the operating room, appropriate anesthetic monitors were attached. Spinal anesthesia induced with the patient in supine position, Foley catheter was inserted. Tourniquet applied high to the operative thigh. Lateral post and foot positioner applied to the table, the lower extremity was then prepped and draped in usual sterile fashion from the ankle to the tourniquet. Time-out procedure was performed. The limb was wrapped with an Esmarch  bandage and the tourniquet inflated to 365 mmHg.   We began the operation by making a 6cm anterior midline incision. Small bleeders in the skin and the subcutaneous tissue identified and cauterized. Transverse retinaculum was incised and reflected medially and a medial parapatellar arthrotomy was accomplished. the patella was everted and theprepatellar fat pad resected. The superficial medial collateral ligament was then elevated from anterior to posterior along the proximal flare of the tibia and anterior half of the menisci resected. The knee was hyperflexed exposing bone on bone arthritis. Peripheral and notch osteophytes as well as the cruciate ligaments were then resected. We continued to work our way around posteriorly along the proximal tibia, and externally rotated the tibia subluxing it out from underneath the femur. A McHale retractor was placed through the notch and a lateral Hohmann retractor placed, and an external tibial guide was placed.  The tibial cutting guide was pinned into place allowing resection of 6 mm of bone medially and about 3 mm of bone laterally because of  her valgus deformity.   Satisfied with the tibial resection, we then entered the distal femur 2 mm anterior to the PCL origin with the intramedullary guide rod and applied the distal femoral cutting guide set at 46mm, with 5 degrees of valgus. This was pinned along the epicondylar axis. At this point, the distal femoral cut was accomplished without difficulty. We then sized for a 5 femoral component and pinned the guide in 3 degrees of external rotation.The chamfer cutting guide was pinned into place. The anterior, posterior, and chamfer cuts were accomplished without difficulty followed by the  RP box cutting guide and the box cut. We also removed posterior osteophytes from the posterior femoral condyles. At this time, the knee was brought into full extension. We checked our extension and flexion gaps and found them symmetric at  8.  The patella thickness measured at 1m m. We set the cutting guide at 15 and removed the posterior patella sized for 32 button and drilled the lollipop. The knee was then once again hyperflexed exposing the proximal tibia. We sized for a # 5 tibial base plate, applied the smokestack and the conical reamer followed by the the Delta fin keel punch. We then hammered into place the  RP trial femoral component, inserted a trial bearing, trial patellar button, and took the knee through range of motion from 0-130 degrees. No thumb pressure was required for patellar tracking.   At this point, all trial components were removed, a double batch of DePuy HV cement  was mixed and applied to all bony metallic mating surfaces. In order, we hammered into place the tibial tray and removed excess cement, the femoral component and removed excess cement, a 8 mm  RP bearing was inserted, and the knee brought to full extension with compression. The patellar button was clamped into place, and excess cement removed. While the cement cured the wound was irrigated out with normal saline solution pulse lavage, and exparel was injected throughout the knee. Ligament stability and patellar tracking were checked and found to be excellent..   The parapatellar arthrotomy was closed with  #1 Vicryl suture. The subcutaneous tissue with 0 and 2-0 undyed Vicryl suture, and 4-0 Monocryl.. A dressing of Aquaseal, 4 x 4, dressing sponges, Webril, and Ace wrap applied. Needle and sponge count were correct times 2.The patient awakened, extubated, and taken to recovery room without difficulty. Vascular status was normal, pulses 2+ and symmetric.    [name] 01/10/2018, 8:56 AM

## 2018-11-28 NOTE — Anesthesia Preprocedure Evaluation (Signed)
Anesthesia Evaluation  Patient identified by MRN, date of birth, ID band Patient awake    Reviewed: Allergy & Precautions, H&P , NPO status , Patient's Chart, lab work & pertinent test results  Airway Mallampati: II   Neck ROM: full    Dental   Pulmonary shortness of breath, former smoker,    breath sounds clear to auscultation       Cardiovascular hypertension,  Rhythm:regular Rate:Normal     Neuro/Psych PSYCHIATRIC DISORDERS Anxiety Depression Bipolar Disorder  Neuromuscular disease    GI/Hepatic GERD  ,  Endo/Other    Renal/GU      Musculoskeletal  (+) Arthritis , Fibromyalgia -  Abdominal   Peds  Hematology   Anesthesia Other Findings   Reproductive/Obstetrics                             Anesthesia Physical Anesthesia Plan  ASA: II  Anesthesia Plan: Spinal   Post-op Pain Management:  Regional for Post-op pain   Induction: Intravenous  PONV Risk Score and Plan: 2 and Ondansetron, Propofol infusion and Treatment may vary due to age or medical condition  Airway Management Planned: Simple Face Mask  Additional Equipment:   Intra-op Plan:   Post-operative Plan:   Informed Consent: I have reviewed the patients History and Physical, chart, labs and discussed the procedure including the risks, benefits and alternatives for the proposed anesthesia with the patient or authorized representative who has indicated his/her understanding and acceptance.       Plan Discussed with: CRNA, Anesthesiologist and Surgeon  Anesthesia Plan Comments:         Anesthesia Quick Evaluation

## 2018-11-28 NOTE — Transfer of Care (Signed)
Immediate Anesthesia Transfer of Care Note  Patient: Elizabeth Stone  Procedure(s) Performed: LEFT TOTAL KNEE ARTHROPLASTY (Left Knee)  Patient Location: PACU  Anesthesia Type:Spinal  Level of Consciousness: awake, alert  and oriented  Airway & Oxygen Therapy: Patient Spontanous Breathing and Patient connected to nasal cannula oxygen  Post-op Assessment: Report given to RN and Post -op Vital signs reviewed and stable  Post vital signs: Reviewed and stable  Last Vitals:  Vitals Value Taken Time  BP 112/56 11/28/2018  9:19 AM  Temp    Pulse 86 11/28/2018  9:23 AM  Resp 22 11/28/2018  9:23 AM  SpO2 93 % 11/28/2018  9:23 AM  Vitals shown include unvalidated device data.  Last Pain:  Vitals:   11/28/18 0649  PainSc: 0-No pain         Complications: No apparent anesthesia complications

## 2018-11-28 NOTE — Anesthesia Procedure Notes (Signed)
Anesthesia Regional Block: Adductor canal block   Pre-Anesthetic Checklist: ,, timeout performed, Correct Patient, Correct Site, Correct Laterality, Correct Procedure, Correct Position, site marked, Risks and benefits discussed,  Surgical consent,  Pre-op evaluation,  At surgeon's request and post-op pain management  Laterality: Left  Prep: chloraprep       Needles:  Injection technique: Single-shot  Needle Type: Echogenic Needle     Needle Length: 9cm  Needle Gauge: 21     Additional Needles:   Narrative:  Start time: 11/28/2018 7:01 AM End time: 11/28/2018 7:11 AM Injection made incrementally with aspirations every 5 mL.  Performed by: Personally  Anesthesiologist: Albertha Ghee, MD  Additional Notes: Pt tolerated the procedure well.

## 2018-11-28 NOTE — Anesthesia Procedure Notes (Signed)
Procedure Name: MAC Date/Time: 11/28/2018 7:17 AM Performed by: Alain Marion, CRNA Pre-anesthesia Checklist: Patient identified, Emergency Drugs available, Suction available and Patient being monitored Oxygen Delivery Method: Simple face mask Placement Confirmation: positive ETCO2

## 2018-11-29 ENCOUNTER — Encounter (HOSPITAL_COMMUNITY): Payer: Self-pay | Admitting: Orthopedic Surgery

## 2018-11-29 LAB — CBC
HCT: 34.9 % — ABNORMAL LOW (ref 36.0–46.0)
Hemoglobin: 12 g/dL (ref 12.0–15.0)
MCH: 32.9 pg (ref 26.0–34.0)
MCHC: 34.4 g/dL (ref 30.0–36.0)
MCV: 95.6 fL (ref 80.0–100.0)
Platelets: 154 10*3/uL (ref 150–400)
RBC: 3.65 MIL/uL — ABNORMAL LOW (ref 3.87–5.11)
RDW: 12.4 % (ref 11.5–15.5)
WBC: 13.2 10*3/uL — ABNORMAL HIGH (ref 4.0–10.5)
nRBC: 0 % (ref 0.0–0.2)

## 2018-11-29 LAB — COMPREHENSIVE METABOLIC PANEL
ALT: 61 U/L — ABNORMAL HIGH (ref 0–44)
AST: 55 U/L — ABNORMAL HIGH (ref 15–41)
Albumin: 3.2 g/dL — ABNORMAL LOW (ref 3.5–5.0)
Alkaline Phosphatase: 101 U/L (ref 38–126)
Anion gap: 9 (ref 5–15)
BUN: 10 mg/dL (ref 8–23)
CO2: 25 mmol/L (ref 22–32)
Calcium: 9.3 mg/dL (ref 8.9–10.3)
Chloride: 102 mmol/L (ref 98–111)
Creatinine, Ser: 0.63 mg/dL (ref 0.44–1.00)
GFR calc Af Amer: >60 mL/min (ref >60)
Glucose, Bld: 186 mg/dL — ABNORMAL HIGH (ref 70–99)
Potassium: 4.3 mmol/L (ref 3.5–5.1)
Sodium: 136 mmol/L (ref 135–145)
Total Bilirubin: 0.7 mg/dL (ref 0.3–1.2)
Total Protein: 5.9 g/dL — ABNORMAL LOW (ref 6.5–8.1)

## 2018-11-29 LAB — GLUCOSE, CAPILLARY
Glucose-Capillary: 188 mg/dL — ABNORMAL HIGH (ref 70–99)
Glucose-Capillary: 221 mg/dL — ABNORMAL HIGH (ref 70–99)
Glucose-Capillary: 253 mg/dL — ABNORMAL HIGH (ref 70–99)
Glucose-Capillary: 122 mg/dL — ABNORMAL HIGH (ref 70–99)

## 2018-11-29 MED ORDER — SODIUM CHLORIDE 0.9 % IV BOLUS
500.0000 mL | Freq: Once | INTRAVENOUS | Status: AC
  Administered 2018-11-29: 500 mL via INTRAVENOUS

## 2018-11-29 NOTE — Progress Notes (Addendum)
Physical Therapy Treatment Patient Details Name: Elizabeth Stone MRN: [id number] DOB: 26-Dec-1945 Today's Date: 11/29/2018    History of Present Illness Patient is 73 y/o female s/p L TKA. PMH includes anxiety, GERD, HTN, meniere's disease, bipolar disorder, hearing impaired, osteoporosis, and hx of R TKA.     PT Comments    Patient seen for mobility progression. Pt is able to ambulate 75 ft with RW and min A. Gait training distance limited by pain and pt too painful this am to attempt stair training. Pt premedicated. Pt continues to be motivated to participate in therapy. Continue to progress as tolerated.     Follow Up Recommendations  Follow surgeon's recommendation for DC plan and follow-up therapies     Equipment Recommendations  3in1 (PT)    Recommendations for Other Services       Precautions / Restrictions Precautions Precautions: Fall;Knee Precaution Comments: reviewed knee precautions/positioning with patient Restrictions Weight Bearing Restrictions: Yes LLE Weight Bearing: Weight bearing as tolerated    Mobility  Bed Mobility               General bed mobility comments: pt OOB in chair upon arrival  Transfers Overall transfer level: Needs assistance Equipment used: Rolling walker (2 wheeled) Transfers: Sit to/from Stand Sit to Stand: Min assist         General transfer comment: pt stood X 2 from recliner; safe hand placement demonstrated  Ambulation/Gait Ambulation/Gait assistance: Min assist Gait Distance (Feet): 75 Feet Assistive device: Rolling walker (2 wheeled) Gait Pattern/deviations: Step-through pattern;Decreased stride length;Trunk flexed Gait velocity: decreased   General Gait Details: cues for posture and maintaining safe proximity to [employer] Mobility    Modified Rankin (Stroke Patients Only)       Balance Overall balance assessment: Needs assistance Sitting-balance support: Feet supported;No  upper extremity supported Sitting balance-Leahy Scale: Good     Standing balance support: Bilateral upper extremity supported Standing balance-Leahy Scale: Poor                              Cognition Arousal/Alertness: Awake/alert Behavior During Therapy: WFL for tasks assessed/performed Overall Cognitive Status: Within Functional Limits for tasks assessed                                        Exercises      General Comments        Pertinent Vitals/Pain Pain Assessment: 0-10 Pain Score: 7 (at rest but appeared to increase with weight bearing ) Pain Location: L knee Pain Descriptors / Indicators: Grimacing;Guarding Pain Intervention(s): Limited activity within patient's tolerance;Monitored during session;Premedicated before session;Repositioned    Home Living                      Prior Function            PT Goals (current goals can now be found in the care plan section) Acute Rehab PT Goals Patient Stated Goal: have less pain Progress towards PT goals: Progressing toward goals    Frequency    7X/week      PT Plan Current plan remains appropriate    Co-evaluation              AM-PAC PT "6 Clicks" Mobility  Outcome Measure  Help needed turning from your back to your side while in a flat bed without using bedrails?: None Help needed moving from lying on your back to sitting on the side of a flat bed without using bedrails?: A Little Help needed moving to and from a bed to a chair (including a wheelchair)?: A Little Help needed standing up from a chair using your arms (e.g., wheelchair or bedside chair)?: A Little Help needed to walk in hospital room?: A Little Help needed climbing 3-5 steps with a railing? : A Lot 6 Click Score: 18    End of Session Equipment Utilized During Treatment: Gait belt Activity Tolerance: Patient limited by pain Patient left: in chair;with call bell/phone within reach Nurse  Communication: Mobility status PT Visit Diagnosis: Unsteadiness on feet (R26.81);Muscle weakness (generalized) (M62.81);Difficulty in walking, not elsewhere classified (R26.2)     Time: 6269-4854 PT Time Calculation (min) (ACUTE ONLY): 25 min  Charges:  $Gait Training: 23-37 mins                     Earney Navy, PTA Acute Rehabilitation Services Pager: [phone] Office: [phone]     Darliss Cheney 11/29/2018, 11:15 AM

## 2018-11-29 NOTE — Progress Notes (Signed)
Inpatient Diabetes Program Recommendations  AACE/ADA: New Consensus Statement on Inpatient Glycemic Control (2015)  Target Ranges:  Prepandial:   less than 140 mg/dL      Peak postprandial:   less than 180 mg/dL (1-2 hours)      Critically ill patients:  140 - 180 mg/dL   Lab Results  Component Value Date   GLUCAP 188 (H) 11/29/2018   HGBA1C 5.0 12/26/2015    Review of Glycemic Control Results for Elizabeth Stone, Elizabeth Stone (MRN [id number]) as of 11/29/2018 10:48  Ref. Range 11/28/2018 11:57 11/28/2018 16:24 11/28/2018 21:37 11/29/2018 06:20  Glucose-Capillary Latest Ref Range: 70 - 99 mg/dL 229 (H) 293 (H) 212 (H) 188 (H)   Diabetes history: none Outpatient Diabetes medications: none Current orders for Inpatient glycemic control: Lantus 20 units QHS, Novolog 4 units TID, Novolog 0-15 units TID, Novolog 0-5 units QHS  Decadron 10 mg Q8H to end on 11/30/2018  Inpatient Diabetes Program Recommendations:    Noted on admission, prior to administration of steroids, CBG 166 mg/dL, may want to consider repeating A1C as las tone from 2017?   Additionally, noted steroids course to end on 11/30/2018. In the meantime, may want to consider increasing meal coverage to Novolog 6 units TID (Assuming patient is consuming >50% of meal and expect needs to decrease once steroids discontinued).   Thanks, Bronson Curb, MSN, RNC-OB Diabetes Coordinator [phone] (8a-5p)

## 2018-11-29 NOTE — Progress Notes (Signed)
Physical Therapy Treatment Patient Details Name: Elizabeth Stone MRN: [id number] DOB: 1946/04/21 Today's Date: 11/29/2018    History of Present Illness Patient is 73 y/o female s/p L TKA. PMH includes anxiety, GERD, HTN, meniere's disease, bipolar disorder, hearing impaired, osteoporosis, and hx of R TKA.     PT Comments    Patient seen for mobility progression. Pt tolerated gait training distance of 80 ft and ascended/descended 2 steps with min A. Pt begins shaking all over while mobilizing not sure if this is just from increased pain? Continue to progress as tolerated.    Follow Up Recommendations  Follow surgeon's recommendation for DC plan and follow-up therapies     Equipment Recommendations  3in1 (PT)    Recommendations for Other Services       Precautions / Restrictions Precautions Precautions: Fall;Knee Precaution Comments: reviewed knee precautions/positioning with patient Restrictions Weight Bearing Restrictions: Yes LLE Weight Bearing: Weight bearing as tolerated    Mobility  Bed Mobility Overal bed mobility: Modified Independent                Transfers Overall transfer level: Needs assistance Equipment used: Rolling walker (2 wheeled) Transfers: Sit to/from Stand Sit to Stand: Min assist         General transfer comment: assist to power up into standing; safe hand placement demonstrated  Ambulation/Gait Ambulation/Gait assistance: Min assist Gait Distance (Feet): 80 Feet Assistive device: Rolling walker (2 wheeled) Gait Pattern/deviations: Step-through pattern;Decreased stride length;Trunk flexed Gait velocity: decreased   General Gait Details: cues for breathing technique, posture, and maintaining safe proximity to [employer] Stairs: Yes Stairs assistance: Min assist Stair Management: One rail Left;Sideways;Step to pattern Number of Stairs: 2 General stair comments: cues for sequencing and technique   Wheelchair Mobility     Modified Rankin (Stroke Patients Only)       Balance Overall balance assessment: Needs assistance Sitting-balance support: Feet supported;No upper extremity supported Sitting balance-Leahy Scale: Good     Standing balance support: Bilateral upper extremity supported Standing balance-Leahy Scale: Poor                              Cognition Arousal/Alertness: Awake/alert Behavior During Therapy: WFL for tasks assessed/performed Overall Cognitive Status: Within Functional Limits for tasks assessed                                        Exercises      General Comments General comments (skin integrity, edema, etc.): pt's friend present during session; pt shaking all over while mobilizing      Pertinent Vitals/Pain Pain Assessment: 0-10 Pain Score: 8  Pain Location: L knee Pain Descriptors / Indicators: Grimacing;Guarding Pain Intervention(s): Limited activity within patient's tolerance;Premedicated before session;Monitored during session;Repositioned    Home Living                      Prior Function            PT Goals (current goals can now be found in the care plan section) Acute Rehab PT Goals Patient Stated Goal: have less pain Progress towards PT goals: Progressing toward goals    Frequency    7X/week      PT Plan Current plan remains appropriate    Co-evaluation  AM-PAC PT "6 Clicks" Mobility   Outcome Measure  Help needed turning from your back to your side while in a flat bed without using bedrails?: None Help needed moving from lying on your back to sitting on the side of a flat bed without using bedrails?: A Little Help needed moving to and from a bed to a chair (including a wheelchair)?: A Little Help needed standing up from a chair using your arms (e.g., wheelchair or bedside chair)?: A Little Help needed to walk in hospital room?: A Little Help needed climbing 3-5 steps with a  railing? : A Little 6 Click Score: 19    End of Session Equipment Utilized During Treatment: Gait belt Activity Tolerance: Patient tolerated treatment well Patient left: with call bell/phone within reach;in bed;in CPM Nurse Communication: Mobility status PT Visit Diagnosis: Unsteadiness on feet (R26.81);Muscle weakness (generalized) (M62.81);Difficulty in walking, not elsewhere classified (R26.2)     Time: 2956-2130 PT Time Calculation (min) (ACUTE ONLY): 31 min  Charges:  $Gait Training: 23-37 mins                     Earney Navy, PTA Acute Rehabilitation Services Pager: [phone] Office: [phone]     Darliss Cheney 11/29/2018, 4:11 PM

## 2018-11-29 NOTE — Anesthesia Postprocedure Evaluation (Signed)
Anesthesia Post Note  Patient: Elizabeth Stone  Procedure(s) Performed: LEFT TOTAL KNEE ARTHROPLASTY (Left Knee)     Patient location during evaluation: PACU Anesthesia Type: Spinal Level of consciousness: oriented and awake and alert Pain management: pain level controlled Vital Signs Assessment: post-procedure vital signs reviewed and stable Respiratory status: spontaneous breathing, respiratory function stable and patient connected to nasal cannula oxygen Cardiovascular status: blood pressure returned to baseline and stable Postop Assessment: no headache, no backache and no apparent nausea or vomiting Anesthetic complications: no    Last Vitals:  Vitals:   11/28/18 2002 11/29/18 0416  BP: 128/61 112/62  Pulse: 95 84  Resp: 18 18  Temp: 36.9 C 36.6 C  SpO2: 91% 93%    Last Pain:  Vitals:   11/29/18 0621  TempSrc:   PainSc: [name]

## 2018-11-30 LAB — GLUCOSE, CAPILLARY
GLUCOSE-CAPILLARY: 150 mg/dL — AB (ref 70–99)
GLUCOSE-CAPILLARY: 166 mg/dL — AB (ref 70–99)
Glucose-Capillary: 153 mg/dL — ABNORMAL HIGH (ref 70–99)

## 2018-11-30 LAB — CBC
HCT: 34.3 % — ABNORMAL LOW (ref 36.0–46.0)
Hemoglobin: 11.6 g/dL — ABNORMAL LOW (ref 12.0–15.0)
MCH: 32.7 pg (ref 26.0–34.0)
MCHC: 33.8 g/dL (ref 30.0–36.0)
MCV: 96.6 fL (ref 80.0–100.0)
Platelets: 173 10*3/uL (ref 150–400)
RBC: 3.55 MIL/uL — AB (ref 3.87–5.11)
RDW: 12.5 % (ref 11.5–15.5)
WBC: 16.1 10*3/uL — ABNORMAL HIGH (ref 4.0–10.5)
nRBC: 0 % (ref 0.0–0.2)

## 2018-11-30 MED ORDER — POLYETHYLENE GLYCOL 3350 17 G PO PACK: PACK | ORAL | 0 refills | Status: DC

## 2018-11-30 MED ORDER — GABAPENTIN 300 MG PO CAPS: ORAL_CAPSULE | ORAL | 0 refills | Status: DC

## 2018-11-30 MED ORDER — DOCUSATE SODIUM 100 MG PO CAPS: ORAL_CAPSULE | ORAL | 0 refills | Status: DC

## 2018-11-30 MED ORDER — ASPIRIN 325 MG PO TBEC: DELAYED_RELEASE_TABLET | ORAL | 0 refills | Status: DC

## 2018-11-30 MED ORDER — OXYCODONE HCL 5 MG PO TABS
5.0000 mg | ORAL_TABLET | ORAL | Status: DC | PRN
  Administered 2018-11-30 (×2): 5 mg via ORAL
  Filled 2018-11-30 (×2): qty 1

## 2018-11-30 MED ORDER — OXYCODONE HCL 5 MG PO TABS: ORAL_TABLET | ORAL | 0 refills | Status: DC

## 2018-11-30 NOTE — Care Management Note (Signed)
Case Management Note  Patient Details  Name: QUAMESHA MULLET MRN: [id number] Date of Birth: [birth year]  Subjective/Objective:   73 yr old female s/p left total knee arthoplasty secondary to osteoarthritis of the left knee.               Action/Plan: Patient was preoperatively setup with [facility], no changes. Will have support at discharge. Patient has all necessary DME at the home.   Expected Discharge Date:  11/30/18               Expected Discharge Plan:  [place]  In-House Referral:  NA  Discharge planning Services  CM Consult  Post Acute Care Choice:  Home Health, Durable Medical Equipment Choice offered to:  Patient  DME Arranged:  3-N-1, Walker rolling, CPM DME Agency:  [facility]  HH Arranged:  PT [place] Agency:  [facility] (formerly [employer])  Status of Service:  Completed, signed off  If discussed at [employer], dates discussed:    Additional Comments:  Ninfa Meeker, RN 11/30/2018, 8:54 AM

## 2018-11-30 NOTE — Discharge Summary (Signed)
Patient ID: ELWYN LOWDEN MRN: [id number] DOB/AGE: May 18, 1946 73 y.o.  Admit date: 11/28/2018 Discharge date: 11/30/2018  Admission Diagnoses:  Principal Problem:   Primary localized osteoarthritis of left knee Active Problems:   Anxiety   GERD (gastroesophageal reflux disease)   Meniere disease   Hearing impaired   Essential hypertension, benign   Bipolar I disorder with depression, severe ([place])   Discharge Diagnoses:  Same  Past Medical History:  Diagnosis Date  . Acute blood loss anemia    hx  . Anxiety   . Arthritis   . Bipolar disorder ([place])   . Cochlear implant in place   . Depression   . Dermatitis    on face  . Dyspnea   . Fibromyalgia   . GERD (gastroesophageal reflux disease)   . Hearing impaired    has cochlear implant  . HTN (hypertension)   . Hypokalemia   . Meniere disease    takes HCTZ for her inner ear problem  . Mixed hyperlipidemia 09/27/2013  . Osteoporosis   . Pneumonia 11/2016  . Pre-diabetes   . Primary localized osteoarthritis of left knee 11/16/2018  . Primary localized osteoarthritis of right knee   . Slow transit constipation   . Unsteady gait     Surgeries: Procedure(s): LEFT TOTAL KNEE ARTHROPLASTY on 11/28/2018   Consultants:   Discharged Condition: Improved  Hospital Course: CAROLA VIRAMONTES is an 73 y.o. female who was admitted 11/28/2018 for operative treatment ofPrimary localized osteoarthritis of left knee. Patient has severe unremitting pain that affects sleep, daily activities, and work/hobbies. After pre-op clearance the patient was taken to the operating room on 11/28/2018 and underwent  Procedure(s): LEFT TOTAL KNEE ARTHROPLASTY.    Patient was given perioperative antibiotics:  Anti-infectives (From admission, onward)   Start     Dose/Rate Route Frequency Ordered Stop   11/28/18 1400  ceFAZolin (ANCEF) IVPB 2g/100 mL premix     2 g 200 mL/hr over 30 Minutes Intravenous Every 6 hours 11/28/18 1153 11/28/18 2035   11/28/18  0633  ceFAZolin (ANCEF) 2-4 GM/100ML-% IVPB    Note to Pharmacy:  Cordelia Pen   : cabinet override      11/28/18 0633 11/28/18 0726   11/28/18 0630  ceFAZolin (ANCEF) IVPB 2g/100 mL premix     2 g 200 mL/hr over 30 Minutes Intravenous On call to O.R. 11/28/18 1751 11/28/18 0726       Patient was given sequential compression devices, early ambulation, and chemoprophylaxis to prevent DVT.  Patient benefited maximally from hospital stay and there were no complications.    Recent vital signs:  Patient Vitals for the past 24 hrs:  BP Temp Temp src Pulse Resp SpO2  11/30/18 0339 (!) 147/73 98.6 F (37 C) Oral 88 17 (!) 87 %  11/29/18 2006 (!) 172/80 98.5 F (36.9 C) Oral 100 18 94 %  11/29/18 1456 130/62 98.8 F (37.1 C) Oral (!) 105 17 -     Recent laboratory studies:  Recent Labs    11/29/18 0354 11/30/18 0231  WBC 13.2* 16.1*  HGB 12.0 11.6*  HCT 34.9* 34.3*  PLT 154 173  NA 136  --   K 4.3  --   CL 102  --   CO2 25  --   BUN 10  --   CREATININE 0.63  --   GLUCOSE 186*  --   CALCIUM 9.3  --      Discharge Medications:   Allergies as of 11/30/2018  Reactions   Adhesive [tape]    PT STATED ALLERGY TO TAPE, NOT LATEX GLOVES AS PREVIOUSLY DOCUMENTED      Medication List    STOP taking these medications   diclofenac 75 MG EC tablet Commonly known as:  VOLTAREN   HYDROcodone-acetaminophen 10-325 MG tablet Commonly known as:  NORCO   HYDROcodone-homatropine 5-1.5 MG/5ML syrup Commonly known as:  HYCODAN   vitamin C 1000 MG tablet     TAKE these medications   acetaminophen 500 MG tablet Commonly known as:  TYLENOL Take 1,000 mg by mouth every 6 (six) hours as needed for mild pain (for pain.).   Asenapine Maleate 10 MG Subl Place 10 mg under the tongue at bedtime.   aspirin 325 MG EC tablet 1 tab a day for the next 30 days to prevent blood clots What changed:    medication strength  how much to take  how to take this  when to take  this  additional instructions   atorvastatin 10 MG tablet Commonly known as:  LIPITOR Take 10 mg by mouth at bedtime.   buPROPion 300 MG 24 hr tablet Commonly known as:  WELLBUTRIN XL Take 300 mg by mouth daily.   CALCIUM 1200 PO Take 1 tablet by mouth at bedtime.   calcium carbonate 500 MG chewable tablet Commonly known as:  TUMS - dosed in mg elemental calcium Chew 1 tablet by mouth 3 (three) times daily as needed for indigestion or heartburn.   clonazePAM 0.5 MG tablet Commonly known as:  KLONOPIN Take 1 mg by mouth at bedtime.   docusate sodium 100 MG capsule Commonly known as:  COLACE 1 tab 2 times a day while on narcotics.  STOOL SOFTENER What changed:    how much to take  how to take this  when to take this  reasons to take this  additional instructions   DULoxetine 60 MG capsule Commonly known as:  CYMBALTA Take 60 mg by mouth 2 (two) times daily.   gabapentin 300 MG capsule Commonly known as:  NEURONTIN 1 po qhs for nerve pain   hydrochlorothiazide 25 MG tablet Commonly known as:  HYDRODIURIL Take 25 mg by mouth Daily.   hydrOXYzine 25 MG tablet Commonly known as:  ATARAX/VISTARIL Take 25-50 mg by mouth at bedtime.   KLOR-CON M20 20 MEQ tablet Generic drug:  potassium chloride SA Take 20 mEq by mouth daily.   lamoTRIgine 200 MG tablet Commonly known as:  LAMICTAL Take 200 mg by mouth at bedtime.   METROCREAM 0.75 % cream Generic drug:  metroNIDAZOLE Apply 1 application topically at bedtime.   montelukast 10 MG tablet Commonly known as:  SINGULAIR Take 10 mg by mouth at bedtime.   oxyCODONE 5 MG immediate release tablet Commonly known as:  Oxy IR/ROXICODONE 1 po q 4 hrs prn pain.  Patient had a total knee replacement on 11/28/2018. Stop hydrocodone while on this medication   polyethylene glycol packet Commonly known as:  MIRALAX / GLYCOLAX 17grams in 6 oz of water twice a day until bowel movement.  LAXITIVE.  Restart if two days since  last bowel movement   sennosides-docusate sodium 8.6-50 MG tablet Commonly known as:  SENOKOT-S Take 2 tablets by mouth every other day as needed for constipation.   Vitamin D3 50 MCG (2000 UT) Tabs Take 2,000 Units by mouth at bedtime.            Discharge Care Instructions  (From admission, onward)  Start     Ordered   11/30/18 0000  Change dressing    Comments:  DO NOT REMOVE BANDAGE OVER SURGICAL INCISION.  [place] WHOLE LEG INCLUDING OVER THE WATERPROOF BANDAGE WITH SOAP AND WATER EVERY DAY.   11/30/18 0820          Diagnostic Studies: No results found.  Disposition: Discharge disposition: 01-Home or Self Care       Discharge Instructions    CPM   Complete by:  As directed    Continuous passive motion machine (CPM):      Use the CPM from 0 to 90 for 6 hours per day.       You may break it up into 2 or 3 sessions per day.      Use CPM for 2 weeks or until you are told to stop.   Call MD / Call 911   Complete by:  As directed    If you experience chest pain or shortness of breath, CALL 911 and be transported to the hospital emergency room.  If you develope a fever above 101 F, pus (white drainage) or increased drainage or redness at the wound, or calf pain, call your surgeon's office.   Change dressing   Complete by:  As directed    DO NOT REMOVE BANDAGE OVER SURGICAL INCISION.  [place] WHOLE LEG INCLUDING OVER THE WATERPROOF BANDAGE WITH SOAP AND WATER EVERY DAY.   Constipation Prevention   Complete by:  As directed    Drink plenty of fluids.  Prune juice may be helpful.  You may use a stool softener, such as Colace (over the counter) 100 mg twice a day.  Use MiraLax (over the counter) for constipation as needed.   Diet - low sodium heart healthy   Complete by:  As directed    Discharge instructions   Complete by:  As directed    INSTRUCTIONS AFTER JOINT REPLACEMENT   Remove items at home which could result in a fall. This includes throw rugs or  furniture in walking pathways ICE to the affected joint every three hours while awake for 30 minutes at a time, for at least the first 3-5 days, and then as needed for pain and swelling.  Continue to use ice for pain and swelling. You may notice swelling that will progress down to the foot and ankle.  This is normal after surgery.  Elevate your leg when you are not up walking on it.   Continue to use the breathing machine you got in the hospital (incentive spirometer) which will help keep your temperature down.  It is common for your temperature to cycle up and down following surgery, especially at night when you are not up moving around and exerting yourself.  The breathing machine keeps your lungs expanded and your temperature down.   DIET:  As you were doing prior to hospitalization, we recommend a well-balanced diet.  DRESSING / WOUND CARE / SHOWERING  Keep the surgical dressing until follow up.  The dressing is water proof, so you can shower without any extra covering.  IF THE DRESSING FALLS OFF or the wound gets wet inside, change the dressing with sterile gauze.  Please use good hand washing techniques before changing the dressing.  Do not use any lotions or creams on the incision until instructed by your surgeon.    ACTIVITY  Increase activity slowly as tolerated, but follow the weight bearing instructions below.   No driving for 6 weeks or until  further direction given by your physician.  You cannot drive while taking narcotics.  No lifting or carrying greater than 10 lbs. until further directed by your surgeon. Avoid periods of inactivity such as sitting longer than an hour when not asleep. This helps prevent blood clots.  You may return to work once you are authorized by your doctor.     WEIGHT BEARING   Weight bearing as tolerated with assist device (walker, cane, etc) as directed, use it as long as suggested by your surgeon or therapist, typically at least 2-3  weeks.   EXERCISES  Results after joint replacement surgery are often greatly improved when you follow the exercise, range of motion and muscle strengthening exercises prescribed by your doctor. Safety measures are also important to protect the joint from further injury. Any time any of these exercises cause you to have increased pain or swelling, decrease what you are doing until you are comfortable again and then slowly increase them. If you have problems or questions, call your caregiver or physical therapist for advice.   Rehabilitation is important following a joint replacement. After just a few days of immobilization, the muscles of the leg can become weakened and shrink (atrophy).  These exercises are designed to build up the tone and strength of the thigh and leg muscles and to improve motion. Often times heat used for twenty to thirty minutes before working out will loosen up your tissues and help with improving the range of motion but do not use heat for the first two weeks following surgery (sometimes heat can increase post-operative swelling).   These exercises can be done on a training (exercise) mat, on the floor, on a table or on a bed. Use whatever works the best and is most comfortable for you.    Use music or television while you are exercising so that the exercises are a pleasant break in your day. This will make your life better with the exercises acting as a break in your routine that you can look forward to.   Perform all exercises about fifteen times, three times per day or as directed.  You should exercise both the operative leg and the other leg as well.   Exercises include:  Quad Sets - Tighten up the muscle on the front of the thigh (Quad) and hold for 5-10 seconds.   Straight Leg Raises - With your knee straight (if you were given a brace, keep it on), lift the leg to 60 degrees, hold for 3 seconds, and slowly lower the leg.  Perform this exercise against resistance later as  your leg gets stronger.  Leg Slides: Lying on your back, slowly slide your foot toward your buttocks, bending your knee up off the floor (only go as far as is comfortable). Then slowly slide your foot back down until your leg is flat on the floor again.  Angel Wings: Lying on your back spread your legs to the side as far apart as you can without causing discomfort.  Hamstring Strength:  Lying on your back, push your heel against the floor with your leg straight by tightening up the muscles of your buttocks.  Repeat, but this time bend your knee to a comfortable angle, and push your heel against the floor.  You may put a pillow under the heel to make it more comfortable if necessary.   A rehabilitation program following joint replacement surgery can speed recovery and prevent re-injury in the future due to weakened muscles. Contact  your doctor or a physical therapist for more information on knee rehabilitation.    CONSTIPATION  Constipation is defined medically as fewer than three stools per week and severe constipation as less than one stool per week.  Even if you have a regular bowel pattern at home, your normal regimen is likely to be disrupted due to multiple reasons following surgery.  Combination of anesthesia, postoperative narcotics, change in appetite and fluid intake all can affect your bowels.   YOU MUST use at least one of the following options; they are listed in order of increasing strength to get the job done.  They are all available over the counter, and you may need to use some, POSSIBLY even all of these options:    Drink plenty of fluids (prune juice may be helpful) and high fiber foods Colace 100 mg by mouth twice a day  Senokot for constipation as directed and as needed Dulcolax (bisacodyl), take with full glass of water  Miralax (polyethylene glycol) once or twice a day as needed.  If you have tried all these things and are unable to have a bowel movement in the first 3-4 days  after surgery call either your surgeon or your primary doctor.    If you experience loose stools or diarrhea, hold the medications until you stool forms back up.  If your symptoms do not get better within 1 week or if they get worse, check with your doctor.  If you experience "the worst abdominal pain ever" or develop nausea or vomiting, please contact the office immediately for further recommendations for treatment.   ITCHING:  If you experience itching with your medications, try taking only a single pain pill, or even half a pain pill at a time.  You can also use Benadryl over the counter for itching or also to help with sleep.   TED HOSE STOCKINGS:  Use stockings on both legs until for at least 2 weeks or as directed by physician office. They may be removed at night for sleeping.  MEDICATIONS:  See your medication summary on the "After Visit Summary" that nursing will review with you.  You may have some home medications which will be placed on hold until you complete the course of blood thinner medication.  It is important for you to complete the blood thinner medication as prescribed.  PRECAUTIONS:  If you experience chest pain or shortness of breath - call 911 immediately for transfer to the hospital emergency department.   If you develop a fever greater that 101 F, purulent drainage from wound, increased redness or drainage from wound, foul odor from the wound/dressing, or calf pain - CONTACT YOUR SURGEON.                                                   FOLLOW-UP APPOINTMENTS:  If you do not already have a post-op appointment, please call the office for an appointment to be seen by your surgeon.  Guidelines for how soon to be seen are listed in your "After Visit Summary", but are typically between 1-4 weeks after surgery.  OTHER INSTRUCTIONS:   Knee Replacement:  Do not place pillow under knee, focus on keeping the knee straight while resting. CPM instructions: 0-90 degrees, 2 hours in the  morning, 2 hours in the afternoon, and 2 hours in the evening. Place  foam block, curve side up under heel at all times except when in CPM or when walking.  DO NOT modify, tear, cut, or change the foam block in any way.  MAKE SURE YOU:  Understand these instructions.  Get help right away if you are not doing well or get worse.    Thank you for letting us be a part of your medical care team.  It is a privilege we respect greatly.  We hope these instructions will help you stay on track for a fast and full recovery!   Do not put a pillow under the knee. Place it under the heel.   Complete by:  As directed    Place gray foam block, curve side up under heel at all times except when in CPM or when walking.  DO NOT modify, tear, cut, or change in any way the gray foam block.   Increase activity slowly as tolerated   Complete by:  As directed    Patient may shower   Complete by:  As directed    Aquacel dressing is water proof    Wash over it and the whole leg with soap and water at the end of your shower   TED hose   Complete by:  As directed    Use stockings (TED hose) for 2 weeks on both leg(s).  You may remove them at night for sleeping.      Follow-up Information    Elsie Saas, MD Follow up on 12/12/2018.   Specialty:  Orthopedic Surgery Why:  arrive at 12:30 for 1 pm physical therapy with Lytle Michaels and 2 pm appointment with Dr Leotis Pain information: [place] [phone]        Home, [facility] Follow up.   Specialty:  Home Health Services Why:  Home health PT should start tomorrow.  CPM by [facility]. Contact information: [address] [place] [address] [phone]            Signed: Linda Hedges 11/30/2018, 8:27 AM

## 2018-11-30 NOTE — Progress Notes (Signed)
Physical Therapy Treatment Patient Details Name: Elizabeth Stone MRN: [id number] DOB: November 03, 1945 Today's Date: 11/30/2018    History of Present Illness Patient is 73 y/o female s/p L TKA. PMH includes anxiety, GERD, HTN, meniere's disease, bipolar disorder, hearing impaired, osteoporosis, and hx of R TKA.     PT Comments    Patient seen for mobility progression. Pt was premedicated. Pt continues to present with shakiness all over when mobilizing and with unsteady gait. Pt requires min A for gait and stair training. Pt is able to ascend/descend 3 steps this session but does have 9 steps to enter home with 3 landings in between. Pt will continue to benefit from further skilled PT services to maximize independence and safety with mobility.    Follow Up Recommendations  Follow surgeon's recommendation for DC plan and follow-up therapies     Equipment Recommendations  3in1 (PT)    Recommendations for Other Services       Precautions / Restrictions Precautions Precautions: Fall;Knee Precaution Comments: reviewed knee precautions/positioning with patient Restrictions Weight Bearing Restrictions: Yes LLE Weight Bearing: Weight bearing as tolerated    Mobility  Bed Mobility Overal bed mobility: Modified Independent                Transfers Overall transfer level: Needs assistance Equipment used: Rolling walker (2 wheeled) Transfers: Sit to/from Stand Sit to Stand: Min guard         General transfer comment: min guard for safety  Ambulation/Gait Ambulation/Gait assistance: Min assist Gait Distance (Feet): 60 Feet Assistive device: Rolling walker (2 wheeled) Gait Pattern/deviations: Step-through pattern;Decreased stride length;Trunk flexed Gait velocity: decreased   General Gait Details: pt requires assistance for balance; pt with one posterior LOB after descending steps; pt has tendency to let RW get too far in front of her for safe use; cues for breathing technique,  posture, and maintaining safe proximity to [employer] assistance: Min assist Stair Management: One rail Left;Sideways;Step to pattern Number of Stairs: 3 General stair comments: cues for sequencing and technique; assistance for balance    Wheelchair Mobility    Modified Rankin (Stroke Patients Only)       Balance Overall balance assessment: Needs assistance Sitting-balance support: Feet supported;No upper extremity supported Sitting balance-Leahy Scale: Good     Standing balance support: Bilateral upper extremity supported Standing balance-Leahy Scale: Poor                              Cognition Arousal/Alertness: Awake/alert Behavior During Therapy: Anxious Overall Cognitive Status: Within Functional Limits for tasks assessed                                        Exercises      General Comments General comments (skin integrity, edema, etc.): pt continues to be shaky all over while mobilizing       Pertinent Vitals/Pain Pain Assessment: Faces Faces Pain Scale: Hurts little more Pain Location: L knee Pain Descriptors / Indicators: Grimacing;Guarding;Other (Comment) Pain Intervention(s): Limited activity within patient's tolerance;Monitored during session;Premedicated before session;Repositioned    Home Living                      Prior Function            PT Goals (current goals can now be  found in the care plan section) Progress towards PT goals: Progressing toward goals    Frequency    7X/week      PT Plan Current plan remains appropriate    Co-evaluation              AM-PAC PT "6 Clicks" Mobility   Outcome Measure  Help needed turning from your back to your side while in a flat bed without using bedrails?: None Help needed moving from lying on your back to sitting on the side of a flat bed without using bedrails?: A Little Help needed moving to and from a bed to a chair (including a  wheelchair)?: A Little Help needed standing up from a chair using your arms (e.g., wheelchair or bedside chair)?: A Little Help needed to walk in hospital room?: A Little Help needed climbing 3-5 steps with a railing? : A Little 6 Click Score: 19    End of Session Equipment Utilized During Treatment: Gait belt Activity Tolerance: Patient limited by fatigue Patient left: with call bell/phone within reach;in bed;in CPM Nurse Communication: Mobility status PT Visit Diagnosis: Unsteadiness on feet (R26.81);Muscle weakness (generalized) (M62.81);Difficulty in walking, not elsewhere classified (R26.2)     Time: 8937-3428 PT Time Calculation (min) (ACUTE ONLY): 32 min  Charges:  $Gait Training: 23-37 mins                     Earney Navy, PTA Acute Rehabilitation Services Pager: [phone] Office: [phone]     Darliss Cheney 11/30/2018, 10:52 AM

## 2018-11-30 NOTE — Progress Notes (Signed)
Physical Therapy Treatment Patient Details Name: Elizabeth Stone MRN: [id number] DOB: 15-Aug-1946 Today's Date: 11/30/2018    History of Present Illness Patient is 73 y/o female s/p L TKA. PMH includes anxiety, GERD, HTN, meniere's disease, bipolar disorder, hearing impaired, osteoporosis, and hx of R TKA.     PT Comments    Patient seen second session for further gait and stair training. Pt continues to demonstrate unsteady gait and requires assistance. Pt is able to ascend/descend 6 steps and son in law educated on use of gait belt and body mechanics to safely assist pt. Pt has landings after every ~3 steps at home so discussed placing chair on landing and resting if needed in between steps since she has 9 steps to get into home. Continue to progress as tolerated.    Follow Up Recommendations  Follow surgeon's recommendation for DC plan and follow-up therapies     Equipment Recommendations  3in1 (PT)    Recommendations for Other Services       Precautions / Restrictions Precautions Precautions: Fall;Knee Precaution Comments: reviewed knee precautions/positioning with patient Restrictions Weight Bearing Restrictions: Yes LLE Weight Bearing: Weight bearing as tolerated    Mobility  Bed Mobility Overal bed mobility: Modified Independent Bed Mobility: Supine to Sit              Transfers Overall transfer level: Needs assistance Equipment used: Rolling walker (2 wheeled) Transfers: Sit to/from Stand Sit to Stand: Min guard;Min assist         General transfer comment: min A to stand initially due to posterior bias and assist needed to gain balance in standing  Ambulation/Gait Ambulation/Gait assistance: Min assist Gait Distance (Feet): 50 Feet Assistive device: Rolling walker (2 wheeled) Gait Pattern/deviations: Step-through pattern;Decreased stride length;Trunk flexed;Antalgic Gait velocity: decreased   General Gait Details: cues for posture, breathing technique,  and maintaining safe proximity to [employer] assistance: Min assist Stair Management: One rail Left;Sideways;Step to pattern Number of Stairs: 6 General stair comments: cues for sequencing and technique; son in law present and assisting; son in law educated on use of gait belt and body mechanics to safely assist pt    Wheelchair Mobility    Modified Rankin (Stroke Patients Only)       Balance Overall balance assessment: Needs assistance Sitting-balance support: Feet supported;No upper extremity supported Sitting balance-Leahy Scale: Good     Standing balance support: Bilateral upper extremity supported Standing balance-Leahy Scale: Poor                              Cognition Arousal/Alertness: Awake/alert Behavior During Therapy: WFL for tasks assessed/performed;Anxious Overall Cognitive Status: Within Functional Limits for tasks assessed                                        Exercises      General Comments        Pertinent Vitals/Pain Pain Assessment: 0-10 Pain Score: 8  Pain Location: L knee Pain Descriptors / Indicators: Grimacing;Guarding;Sore;Crying Pain Intervention(s): Limited activity within patient's tolerance;Monitored during session;Premedicated before session;Repositioned    Home Living                      Prior Function            PT Goals (current goals can  now be found in the care plan section) Progress towards PT goals: Progressing toward goals    Frequency    7X/week      PT Plan Current plan remains appropriate    Co-evaluation              AM-PAC PT "6 Clicks" Mobility   Outcome Measure  Help needed turning from your back to your side while in a flat bed without using bedrails?: None Help needed moving from lying on your back to sitting on the side of a flat bed without using bedrails?: A Little Help needed moving to and from a bed to a chair (including a wheelchair)?:  A Little Help needed standing up from a chair using your arms (e.g., wheelchair or bedside chair)?: A Little Help needed to walk in hospital room?: A Little Help needed climbing 3-5 steps with a railing? : A Little 6 Click Score: 19    End of Session Equipment Utilized During Treatment: Gait belt Activity Tolerance: Patient limited by fatigue;Patient limited by pain Patient left: with call bell/phone within reach;in bed;in CPM;with family/visitor present Nurse Communication: Mobility status PT Visit Diagnosis: Unsteadiness on feet (R26.81);Muscle weakness (generalized) (M62.81);Difficulty in walking, not elsewhere classified (R26.2)     Time: 0962-8366 PT Time Calculation (min) (ACUTE ONLY): 36 min  Charges:  $Gait Training: 23-37 mins                     Earney Navy, PTA Acute Rehabilitation Services Pager: [phone] Office: [phone]     Darliss Cheney 11/30/2018, 4:29 PM

## 2018-11-30 NOTE — Progress Notes (Signed)
Discharge instructions and medications reviewed with patients and son-in-law. All questions answered. Patient and son-in-law verbalized understanding. IV removed. Patient discharged via wheelchair with all belongings.

## 2018-12-01 DIAGNOSIS — F419 Anxiety disorder, unspecified: Secondary | ICD-10-CM | POA: Diagnosis not present

## 2018-12-01 DIAGNOSIS — M810 Age-related osteoporosis without current pathological fracture: Secondary | ICD-10-CM | POA: Diagnosis not present

## 2018-12-01 DIAGNOSIS — Z96653 Presence of artificial knee joint, bilateral: Secondary | ICD-10-CM | POA: Diagnosis not present

## 2018-12-01 DIAGNOSIS — H9190 Unspecified hearing loss, unspecified ear: Secondary | ICD-10-CM | POA: Diagnosis not present

## 2018-12-01 DIAGNOSIS — F1990 Other psychoactive substance use, unspecified, uncomplicated: Secondary | ICD-10-CM | POA: Diagnosis not present

## 2018-12-01 DIAGNOSIS — K219 Gastro-esophageal reflux disease without esophagitis: Secondary | ICD-10-CM | POA: Diagnosis not present

## 2018-12-01 DIAGNOSIS — Z96612 Presence of left artificial shoulder joint: Secondary | ICD-10-CM | POA: Diagnosis not present

## 2018-12-01 DIAGNOSIS — Z9181 History of falling: Secondary | ICD-10-CM | POA: Diagnosis not present

## 2018-12-01 DIAGNOSIS — Z7982 Long term (current) use of aspirin: Secondary | ICD-10-CM | POA: Diagnosis not present

## 2018-12-01 DIAGNOSIS — Z87891 Personal history of nicotine dependence: Secondary | ICD-10-CM | POA: Diagnosis not present

## 2018-12-01 DIAGNOSIS — Z96611 Presence of right artificial shoulder joint: Secondary | ICD-10-CM | POA: Diagnosis not present

## 2018-12-01 DIAGNOSIS — E782 Mixed hyperlipidemia: Secondary | ICD-10-CM | POA: Diagnosis not present

## 2018-12-01 DIAGNOSIS — Z471 Aftercare following joint replacement surgery: Secondary | ICD-10-CM | POA: Diagnosis not present

## 2018-12-01 DIAGNOSIS — F314 Bipolar disorder, current episode depressed, severe, without psychotic features: Secondary | ICD-10-CM | POA: Diagnosis not present

## 2018-12-01 DIAGNOSIS — H8109 Meniere's disease, unspecified ear: Secondary | ICD-10-CM | POA: Diagnosis not present

## 2018-12-01 DIAGNOSIS — I10 Essential (primary) hypertension: Secondary | ICD-10-CM | POA: Diagnosis not present

## 2018-12-12 DIAGNOSIS — M1712 Unilateral primary osteoarthritis, left knee: Secondary | ICD-10-CM | POA: Diagnosis not present

## 2018-12-13 DIAGNOSIS — Z96652 Presence of left artificial knee joint: Secondary | ICD-10-CM | POA: Diagnosis not present

## 2018-12-13 DIAGNOSIS — M25662 Stiffness of left knee, not elsewhere classified: Secondary | ICD-10-CM | POA: Diagnosis not present

## 2018-12-15 DIAGNOSIS — M25662 Stiffness of left knee, not elsewhere classified: Secondary | ICD-10-CM | POA: Diagnosis not present

## 2018-12-15 DIAGNOSIS — Z96652 Presence of left artificial knee joint: Secondary | ICD-10-CM | POA: Diagnosis not present

## 2018-12-20 DIAGNOSIS — Z96652 Presence of left artificial knee joint: Secondary | ICD-10-CM | POA: Diagnosis not present

## 2018-12-20 DIAGNOSIS — M25662 Stiffness of left knee, not elsewhere classified: Secondary | ICD-10-CM | POA: Diagnosis not present

## 2018-12-22 DIAGNOSIS — Z96652 Presence of left artificial knee joint: Secondary | ICD-10-CM | POA: Diagnosis not present

## 2018-12-22 DIAGNOSIS — M25662 Stiffness of left knee, not elsewhere classified: Secondary | ICD-10-CM | POA: Diagnosis not present

## 2018-12-27 DIAGNOSIS — G894 Chronic pain syndrome: Secondary | ICD-10-CM | POA: Diagnosis not present

## 2018-12-27 DIAGNOSIS — R739 Hyperglycemia, unspecified: Secondary | ICD-10-CM | POA: Diagnosis not present

## 2018-12-29 DIAGNOSIS — Z96652 Presence of left artificial knee joint: Secondary | ICD-10-CM | POA: Diagnosis not present

## 2018-12-29 DIAGNOSIS — M25662 Stiffness of left knee, not elsewhere classified: Secondary | ICD-10-CM | POA: Diagnosis not present

## 2019-01-03 DIAGNOSIS — Z96652 Presence of left artificial knee joint: Secondary | ICD-10-CM | POA: Diagnosis not present

## 2019-01-03 DIAGNOSIS — M25662 Stiffness of left knee, not elsewhere classified: Secondary | ICD-10-CM | POA: Diagnosis not present

## 2019-02-02 DIAGNOSIS — M5416 Radiculopathy, lumbar region: Secondary | ICD-10-CM | POA: Diagnosis not present

## 2019-02-02 DIAGNOSIS — M47816 Spondylosis without myelopathy or radiculopathy, lumbar region: Secondary | ICD-10-CM | POA: Diagnosis not present

## 2019-02-02 DIAGNOSIS — M5137 Other intervertebral disc degeneration, lumbosacral region: Secondary | ICD-10-CM | POA: Diagnosis not present

## 2019-03-07 DIAGNOSIS — Z1231 Encounter for screening mammogram for malignant neoplasm of breast: Secondary | ICD-10-CM | POA: Diagnosis not present

## 2019-03-07 DIAGNOSIS — Z803 Family history of malignant neoplasm of breast: Secondary | ICD-10-CM | POA: Diagnosis not present

## 2019-03-22 DIAGNOSIS — F3181 Bipolar II disorder: Secondary | ICD-10-CM | POA: Diagnosis not present

## 2019-03-28 DIAGNOSIS — H04123 Dry eye syndrome of bilateral lacrimal glands: Secondary | ICD-10-CM | POA: Diagnosis not present

## 2019-03-28 DIAGNOSIS — H40013 Open angle with borderline findings, low risk, bilateral: Secondary | ICD-10-CM | POA: Diagnosis not present

## 2019-03-28 DIAGNOSIS — R7309 Other abnormal glucose: Secondary | ICD-10-CM | POA: Diagnosis not present

## 2019-04-03 DIAGNOSIS — M545 Low back pain: Secondary | ICD-10-CM | POA: Diagnosis not present

## 2019-04-20 DIAGNOSIS — H43393 Other vitreous opacities, bilateral: Secondary | ICD-10-CM | POA: Diagnosis not present

## 2019-04-20 DIAGNOSIS — H35033 Hypertensive retinopathy, bilateral: Secondary | ICD-10-CM | POA: Diagnosis not present

## 2019-04-20 DIAGNOSIS — H40013 Open angle with borderline findings, low risk, bilateral: Secondary | ICD-10-CM | POA: Diagnosis not present

## 2019-04-20 DIAGNOSIS — R7309 Other abnormal glucose: Secondary | ICD-10-CM | POA: Diagnosis not present

## 2019-05-03 DIAGNOSIS — M1990 Unspecified osteoarthritis, unspecified site: Secondary | ICD-10-CM | POA: Diagnosis not present

## 2019-05-03 DIAGNOSIS — R7303 Prediabetes: Secondary | ICD-10-CM | POA: Diagnosis not present

## 2019-05-03 DIAGNOSIS — E782 Mixed hyperlipidemia: Secondary | ICD-10-CM | POA: Diagnosis not present

## 2019-05-03 DIAGNOSIS — G894 Chronic pain syndrome: Secondary | ICD-10-CM | POA: Diagnosis not present

## 2019-05-17 DIAGNOSIS — R7309 Other abnormal glucose: Secondary | ICD-10-CM | POA: Diagnosis not present

## 2019-05-26 DIAGNOSIS — Z96612 Presence of left artificial shoulder joint: Secondary | ICD-10-CM | POA: Diagnosis not present

## 2019-05-26 DIAGNOSIS — M4722 Other spondylosis with radiculopathy, cervical region: Secondary | ICD-10-CM | POA: Diagnosis not present

## 2019-06-21 DIAGNOSIS — E1169 Type 2 diabetes mellitus with other specified complication: Secondary | ICD-10-CM | POA: Diagnosis not present

## 2019-06-21 DIAGNOSIS — M1990 Unspecified osteoarthritis, unspecified site: Secondary | ICD-10-CM | POA: Diagnosis not present

## 2019-06-21 DIAGNOSIS — E209 Hypoparathyroidism, unspecified: Secondary | ICD-10-CM | POA: Diagnosis not present

## 2019-06-21 DIAGNOSIS — E782 Mixed hyperlipidemia: Secondary | ICD-10-CM | POA: Diagnosis not present

## 2019-06-30 DIAGNOSIS — M5412 Radiculopathy, cervical region: Secondary | ICD-10-CM | POA: Diagnosis not present

## 2019-07-03 ENCOUNTER — Other Ambulatory Visit: Payer: Self-pay | Admitting: Orthopedic Surgery

## 2019-07-03 DIAGNOSIS — M545 Low back pain, unspecified: Secondary | ICD-10-CM

## 2019-07-03 DIAGNOSIS — G8929 Other chronic pain: Secondary | ICD-10-CM

## 2019-07-04 ENCOUNTER — Telehealth: Payer: Self-pay

## 2019-07-04 DIAGNOSIS — M1990 Unspecified osteoarthritis, unspecified site: Secondary | ICD-10-CM | POA: Diagnosis not present

## 2019-07-04 DIAGNOSIS — Z6831 Body mass index (BMI) 31.0-31.9, adult: Secondary | ICD-10-CM | POA: Diagnosis not present

## 2019-07-04 DIAGNOSIS — E119 Type 2 diabetes mellitus without complications: Secondary | ICD-10-CM | POA: Diagnosis not present

## 2019-07-04 NOTE — Telephone Encounter (Signed)
Patient returned our calls from 9/14 and 9/15 to be screened for a cervical myelogram.  She was informed she will be here for two hours, will need a driver who is not allowed in the building at the present time, and will need to be on strict bedrest for 24 hours after the procedure.  She also was instructed to hold Wellbutrin Bupropion), Celexa (Citalopram) and Saphris (Asenapine maleate) 48 hours before, and 24 hours after, the procedure.

## 2019-07-19 DIAGNOSIS — L820 Inflamed seborrheic keratosis: Secondary | ICD-10-CM | POA: Diagnosis not present

## 2019-07-19 DIAGNOSIS — Z23 Encounter for immunization: Secondary | ICD-10-CM | POA: Diagnosis not present

## 2019-07-21 DIAGNOSIS — E119 Type 2 diabetes mellitus without complications: Secondary | ICD-10-CM | POA: Diagnosis not present

## 2019-07-25 DIAGNOSIS — M1990 Unspecified osteoarthritis, unspecified site: Secondary | ICD-10-CM | POA: Diagnosis not present

## 2019-07-25 DIAGNOSIS — Z6831 Body mass index (BMI) 31.0-31.9, adult: Secondary | ICD-10-CM | POA: Diagnosis not present

## 2019-07-25 DIAGNOSIS — E119 Type 2 diabetes mellitus without complications: Secondary | ICD-10-CM | POA: Diagnosis not present

## 2019-08-02 ENCOUNTER — Ambulatory Visit
Admission: RE | Admit: 2019-08-02 | Discharge: 2019-08-02 | Disposition: A | Discharge status: Home / Self Care | Payer: Medicare Other | Source: Ambulatory Visit | Attending: Orthopedic Surgery | Admitting: Orthopedic Surgery

## 2019-08-02 ENCOUNTER — Other Ambulatory Visit: Payer: Self-pay

## 2019-08-02 DIAGNOSIS — M545 Low back pain, unspecified: Secondary | ICD-10-CM

## 2019-08-02 DIAGNOSIS — G8929 Other chronic pain: Secondary | ICD-10-CM

## 2019-08-02 DIAGNOSIS — M47812 Spondylosis without myelopathy or radiculopathy, cervical region: Secondary | ICD-10-CM | POA: Diagnosis not present

## 2019-08-02 DIAGNOSIS — M5136 Other intervertebral disc degeneration, lumbar region: Secondary | ICD-10-CM | POA: Diagnosis not present

## 2019-08-02 MED ORDER — ONDANSETRON HCL 4 MG/2ML IJ SOLN: 4.0000 mg | Freq: Four times a day (QID) | INTRAMUSCULAR | Status: DC | PRN

## 2019-08-02 MED ORDER — DIAZEPAM 5 MG PO TABS
5.0000 mg | ORAL_TABLET | Freq: Once | ORAL | Status: AC
  Administered 2019-08-02: 5 mg via ORAL

## 2019-08-02 MED ORDER — IOPAMIDOL (ISOVUE-M 300) INJECTION 61%
10.0000 mL | Freq: Once | INTRAMUSCULAR | Status: AC
  Administered 2019-08-02: 10 mL via INTRATHECAL

## 2019-08-02 NOTE — Progress Notes (Signed)
Pt reports she has been off of her Cymbalta, Saphris, and Wellbutrin for at least 48 hours.

## 2019-08-02 NOTE — Discharge Instructions (Signed)
Myelogram Discharge Instructions  1. Go home and rest quietly for the next 24 hours.  It is important to lie flat for the next 24 hours.  Get up only to go to the restroom.  You may lie in the bed or on a couch on your back, your stomach, your left side or your right side.  You may have one pillow under your head.  You may have pillows between your knees while you are on your side or under your knees while you are on your back.  2. DO NOT drive today.  Recline the seat as far back as it will go, while still wearing your seat belt, on the way home.  3. You may get up to go to the bathroom as needed.  You may sit up for 10 minutes to eat.  You may resume your normal diet and medications unless otherwise indicated.  Drink lots of extra fluids today and tomorrow.  4. The incidence of headache, nausea, or vomiting is about 5% (one in 20 patients).  If you develop a headache, lie flat and drink plenty of fluids until the headache goes away.  Caffeinated beverages may be helpful.  If you develop severe nausea and vomiting or a headache that does not go away with flat bed rest, call [phone].  5. You may resume normal activities after your 24 hours of bed rest is over; however, do not exert yourself strongly or do any heavy lifting tomorrow. If when you get up you have a headache when standing, go back to bed and force fluids for another 24 hours.  6. Call your physician for a follow-up appointment.  The results of your myelogram will be sent directly to your physician by the following day.  7. If you have any questions or if complications develop after you arrive home, please call [phone].  Discharge instructions have been explained to the patient.  The patient, or the person responsible for the patient, fully understands these instructions  YOU MAY RESTART YOUR WELLBUTRIN, CYMBALTA, AND SAPHRIS TOMORROW 08/03/2019 AT 1PM

## 2019-08-04 DIAGNOSIS — M5412 Radiculopathy, cervical region: Secondary | ICD-10-CM | POA: Diagnosis not present

## 2019-08-19 ENCOUNTER — Emergency Department (HOSPITAL_COMMUNITY)
Admission: EM | Admit: 2019-08-19 | Discharge: 2019-08-19 | Disposition: A | Discharge status: Home / Self Care | Payer: Medicare Other

## 2019-08-19 ENCOUNTER — Other Ambulatory Visit: Payer: Self-pay

## 2019-08-19 NOTE — ED Notes (Signed)
Called from lobby x2  No answer

## 2019-08-19 NOTE — ED Notes (Signed)
Pt called form the lobby with no response x1

## 2019-08-19 NOTE — ED Notes (Signed)
Pt called from the lobby with no response x3 

## 2019-08-28 DIAGNOSIS — H903 Sensorineural hearing loss, bilateral: Secondary | ICD-10-CM | POA: Diagnosis not present

## 2019-08-30 DIAGNOSIS — Z45328 Encounter for adjustment and management of other implanted hearing device: Secondary | ICD-10-CM | POA: Diagnosis not present

## 2019-08-30 DIAGNOSIS — H903 Sensorineural hearing loss, bilateral: Secondary | ICD-10-CM | POA: Diagnosis not present

## 2019-08-30 DIAGNOSIS — Z9621 Cochlear implant status: Secondary | ICD-10-CM | POA: Diagnosis not present

## 2019-08-31 ENCOUNTER — Ambulatory Visit: Payer: Medicare Other | Admitting: Podiatry

## 2019-08-31 ENCOUNTER — Telehealth: Payer: Self-pay | Admitting: *Deleted

## 2019-08-31 NOTE — Telephone Encounter (Signed)
"  I'm a patient of Dr. [name].  What's his surgery schedule looking like?  I'm scheduled to see him on December 3."  His schedule is open right now.  I cannot schedule a date until you come in to see him for a consultation.  "I'm not trying to schedule it.  I just wanted to see if he will have time available in January or February."  Yes, he has time available.  "That's all I needed to know."

## 2019-09-05 DIAGNOSIS — M5032 Other cervical disc degeneration, mid-cervical region, unspecified level: Secondary | ICD-10-CM | POA: Diagnosis not present

## 2019-09-05 DIAGNOSIS — M5412 Radiculopathy, cervical region: Secondary | ICD-10-CM | POA: Diagnosis not present

## 2019-09-05 DIAGNOSIS — M542 Cervicalgia: Secondary | ICD-10-CM | POA: Diagnosis not present

## 2019-09-06 DIAGNOSIS — Z23 Encounter for immunization: Secondary | ICD-10-CM | POA: Diagnosis not present

## 2019-09-06 DIAGNOSIS — L820 Inflamed seborrheic keratosis: Secondary | ICD-10-CM | POA: Diagnosis not present

## 2019-09-18 DIAGNOSIS — F1010 Alcohol abuse, uncomplicated: Secondary | ICD-10-CM | POA: Diagnosis not present

## 2019-09-18 DIAGNOSIS — F3181 Bipolar II disorder: Secondary | ICD-10-CM | POA: Diagnosis not present

## 2019-09-19 ENCOUNTER — Ambulatory Visit (INDEPENDENT_AMBULATORY_CARE_PROVIDER_SITE_OTHER): Payer: Medicare Other

## 2019-09-19 ENCOUNTER — Ambulatory Visit (INDEPENDENT_AMBULATORY_CARE_PROVIDER_SITE_OTHER): Payer: Medicare Other | Admitting: Podiatry

## 2019-09-19 ENCOUNTER — Other Ambulatory Visit: Payer: Self-pay

## 2019-09-19 ENCOUNTER — Encounter: Payer: Self-pay | Admitting: Podiatry

## 2019-09-19 VITALS — BP 157/81 | HR 99 | Resp 16

## 2019-09-19 DIAGNOSIS — M2042 Other hammer toe(s) (acquired), left foot: Secondary | ICD-10-CM

## 2019-09-19 DIAGNOSIS — M2021 Hallux rigidus, right foot: Secondary | ICD-10-CM

## 2019-09-19 DIAGNOSIS — M21619 Bunion of unspecified foot: Secondary | ICD-10-CM | POA: Diagnosis not present

## 2019-09-19 DIAGNOSIS — M2022 Hallux rigidus, left foot: Secondary | ICD-10-CM

## 2019-09-19 DIAGNOSIS — M79671 Pain in right foot: Secondary | ICD-10-CM

## 2019-09-19 DIAGNOSIS — M79672 Pain in left foot: Secondary | ICD-10-CM

## 2019-09-19 DIAGNOSIS — M2041 Other hammer toe(s) (acquired), right foot: Secondary | ICD-10-CM | POA: Diagnosis not present

## 2019-09-20 DIAGNOSIS — Z0000 Encounter for general adult medical examination without abnormal findings: Secondary | ICD-10-CM | POA: Diagnosis not present

## 2019-09-20 DIAGNOSIS — W19XXXA Unspecified fall, initial encounter: Secondary | ICD-10-CM | POA: Diagnosis not present

## 2019-09-20 DIAGNOSIS — M545 Low back pain: Secondary | ICD-10-CM | POA: Diagnosis not present

## 2019-09-22 DIAGNOSIS — H43813 Vitreous degeneration, bilateral: Secondary | ICD-10-CM | POA: Diagnosis not present

## 2019-09-22 DIAGNOSIS — H43393 Other vitreous opacities, bilateral: Secondary | ICD-10-CM | POA: Diagnosis not present

## 2019-09-22 DIAGNOSIS — M5032 Other cervical disc degeneration, mid-cervical region, unspecified level: Secondary | ICD-10-CM | POA: Diagnosis not present

## 2019-09-22 DIAGNOSIS — M542 Cervicalgia: Secondary | ICD-10-CM | POA: Diagnosis not present

## 2019-09-22 DIAGNOSIS — M5412 Radiculopathy, cervical region: Secondary | ICD-10-CM | POA: Diagnosis not present

## 2019-09-28 NOTE — Progress Notes (Signed)
Subjective: 73 year old female presents the office today for concerns of hammertoes, bunion deformity.  She wants to discuss surgical intervention.  She states they are hurt and the right side is worse than the left.  She is tried shoe modifications, offloading.  She does have history of ulcerations on the toes which is healed successfully.  She denies any recent injury.Denies any systemic complaints such as fevers, chills, nausea, vomiting. No acute changes since last appointment, and no other complaints at this time.   Objective: AAO x3, NAD DP/PT pulses palpable bilaterally, CRT less than 3 seconds Bunion deformities present with overall pain hallux and second digit with medial deviation of the second digit.  The toe is track bound and difficulty holding rectus position given the contracture of the tendon.  Hammertoe contracture present also digits. No open lesions or pre-ulcerative lesions.  No pain with calf compression, swelling, warmth, erythema  Assessment: Chronic digital deformities  Plan: -All treatment options discussed with the patient including all alternatives, risks, complications.  -X-rays obtained reviewed.  Foot deformities present as well as second digit medial deviation. -We discussed both conservative as well as surgical treatment options.  After discussion she wants to proceed with surgery.  Given the bunion deformity as well as how contracted with tenderness I think the first MPJ arthrodesis would be beneficial for her with submetatarsal osteotomy and repair of the second digit.  We discussed doing also, Akin shoe consistently give her good correction and long-term results.  We will work on getting medical clearance and she is also been discussed with her family.  We will discuss after the first of the year as well.  She is also interested in possibly going to rehab after surgery.  Trula Slade DPM

## 2019-10-04 ENCOUNTER — Encounter: Payer: Self-pay | Admitting: *Deleted

## 2019-10-04 NOTE — Progress Notes (Signed)
Per Dr. [name], I sent a surgical medical clearance request letter to Dr. Orpah Melter.

## 2019-10-06 DIAGNOSIS — H43813 Vitreous degeneration, bilateral: Secondary | ICD-10-CM | POA: Diagnosis not present

## 2019-10-06 DIAGNOSIS — H43393 Other vitreous opacities, bilateral: Secondary | ICD-10-CM | POA: Diagnosis not present

## 2019-10-30 ENCOUNTER — Ambulatory Visit: Payer: Medicare Other | Admitting: Podiatry

## 2019-10-30 ENCOUNTER — Other Ambulatory Visit: Payer: Self-pay

## 2019-10-30 DIAGNOSIS — M2021 Hallux rigidus, right foot: Secondary | ICD-10-CM

## 2019-10-30 DIAGNOSIS — M21619 Bunion of unspecified foot: Secondary | ICD-10-CM

## 2019-10-30 DIAGNOSIS — M2022 Hallux rigidus, left foot: Secondary | ICD-10-CM

## 2019-10-30 DIAGNOSIS — M2041 Other hammer toe(s) (acquired), right foot: Secondary | ICD-10-CM | POA: Diagnosis not present

## 2019-10-30 DIAGNOSIS — M2042 Other hammer toe(s) (acquired), left foot: Secondary | ICD-10-CM

## 2019-10-30 NOTE — Patient Instructions (Signed)

## 2019-10-31 ENCOUNTER — Encounter: Payer: Self-pay | Admitting: Podiatry

## 2019-10-31 ENCOUNTER — Telehealth: Payer: Self-pay | Admitting: *Deleted

## 2019-10-31 NOTE — Telephone Encounter (Signed)
"  I want to schedule my surgery for January 27."  We have you scheduled for November 22, 2019.  So you're saying you want to reschedule it to November 15, 2019?  "Yes, that is correct."  I'll get it rescheduled from 11/22/2019 to 11/15/2019.  "Can you tell me what I need to do prior to my surgery?"  You can find that information on the brochure that we gave you as well as on your after visit summary.  "My son has that information so he can register me for the surgery.  I want you to tell me."  Do not eat or drink anything after midnight.  Someone from the surgical center will give you a call a day or two prior to your surgery date and will give you your arrival time.  "I need to remove all nail polish, correct?"  Yes, that is correct.  All nail polish needs to be removed.  "Thank you for your help."

## 2019-11-01 ENCOUNTER — Encounter (INDEPENDENT_AMBULATORY_CARE_PROVIDER_SITE_OTHER): Payer: Medicare Other | Admitting: Ophthalmology

## 2019-11-01 DIAGNOSIS — E11319 Type 2 diabetes mellitus with unspecified diabetic retinopathy without macular edema: Secondary | ICD-10-CM

## 2019-11-01 DIAGNOSIS — H43813 Vitreous degeneration, bilateral: Secondary | ICD-10-CM

## 2019-11-01 DIAGNOSIS — H2513 Age-related nuclear cataract, bilateral: Secondary | ICD-10-CM

## 2019-11-01 DIAGNOSIS — H3562 Retinal hemorrhage, left eye: Secondary | ICD-10-CM | POA: Diagnosis not present

## 2019-11-01 DIAGNOSIS — E113392 Type 2 diabetes mellitus with moderate nonproliferative diabetic retinopathy without macular edema, left eye: Secondary | ICD-10-CM

## 2019-11-01 DIAGNOSIS — E113291 Type 2 diabetes mellitus with mild nonproliferative diabetic retinopathy without macular edema, right eye: Secondary | ICD-10-CM

## 2019-11-06 NOTE — Progress Notes (Signed)
Subjective: 74 year old female presents the office today with her son for surgical consultation.  She states that she has been having pain to her right foot for quite some time and the toes are overlapping the first and second toes.  She has tried shoe modifications, offloading padding with insignificant improvement.  At this time she wants to have surgery in order to help straighten the toes given her continued pain.  She is also previously had wounds because of the toe position but no current ulcerations identified. Denies any systemic complaints such as fevers, chills, nausea, vomiting. No acute changes since last appointment, and no other complaints at this time.   Objective: AAO x3, NAD DP/PT pulses palpable 2/4 bilaterally, CRT less than 3 seconds Overlapping hallux on the second digit.  Majority of tenderness is between the first and second digits along the medial first metatarsal head.  Bunion deformities present with decreased range of motion of first MPJ and hammertoe contracture present of the second digit on the right foot.  There is no area of pinpoint tenderness identified otherwise. No open lesions or pre-ulcerative lesions.  No pain with calf compression, swelling, warmth, erythema  Assessment: Bunion, hammertoe deformities with overlapping digits  Plan: -All treatment options discussed with the patient including all alternatives, risks, complications.  -X-rays were reviewed with her and her son today.  We again discussed both conservative as well as surgical treatment options.  At this time she wants to proceed with surgery.  Discussed with her right foot first MPJ arthrodesis with second digit hammertoe repair and pinning of the second MPJ.  We discussed the surgery as well as postoperative course.  The patient will stay with her son and daughter-in-law postop to help with the recovery -The incision placement as well as the postoperative course was discussed with the patient. I  discussed risks of the surgery which include, but not limited to, infection, bleeding, pain, swelling, need for further surgery, delayed or nonhealing, painful or ugly scar, numbness or sensation changes, over/under correction, recurrence, transfer lesions, further deformity, hardware failure, DVT/PE, loss of toe/foot. Patient understands these risks and wishes to proceed with surgery. The surgical consent was reviewed with the patient all 3 pages were signed. No promises or guarantees were given to the outcome of the procedure. All questions were answered to the best of my ability. Before the surgery the patient was encouraged to call the office if there is any further questions. The surgery will be performed at the [facility] on an outpatient basis. -Plan for preoperative physical therapy to assess any needs that she may need prior to surgery. -Patient encouraged to call the office with any questions, concerns, change in symptoms.   Trula Slade DPM

## 2019-11-07 DIAGNOSIS — L821 Other seborrheic keratosis: Secondary | ICD-10-CM | POA: Diagnosis not present

## 2019-11-07 DIAGNOSIS — L820 Inflamed seborrheic keratosis: Secondary | ICD-10-CM | POA: Diagnosis not present

## 2019-11-07 DIAGNOSIS — Z23 Encounter for immunization: Secondary | ICD-10-CM | POA: Diagnosis not present

## 2019-11-09 DIAGNOSIS — M25674 Stiffness of right foot, not elsewhere classified: Secondary | ICD-10-CM | POA: Diagnosis not present

## 2019-11-09 DIAGNOSIS — M25571 Pain in right ankle and joints of right foot: Secondary | ICD-10-CM | POA: Diagnosis not present

## 2019-11-10 ENCOUNTER — Other Ambulatory Visit: Payer: Self-pay

## 2019-11-10 ENCOUNTER — Other Ambulatory Visit: Payer: Self-pay | Admitting: Podiatry

## 2019-11-10 ENCOUNTER — Encounter: Payer: Self-pay | Admitting: Podiatry

## 2019-11-10 ENCOUNTER — Ambulatory Visit: Payer: Medicare Other | Admitting: Podiatry

## 2019-11-10 DIAGNOSIS — M2041 Other hammer toe(s) (acquired), right foot: Secondary | ICD-10-CM

## 2019-11-10 DIAGNOSIS — Z01818 Encounter for other preprocedural examination: Secondary | ICD-10-CM

## 2019-11-10 DIAGNOSIS — M2021 Hallux rigidus, right foot: Secondary | ICD-10-CM

## 2019-11-10 DIAGNOSIS — M2042 Other hammer toe(s) (acquired), left foot: Secondary | ICD-10-CM | POA: Diagnosis not present

## 2019-11-10 LAB — COMPREHENSIVE METABOLIC PANEL
AG Ratio: 1.4 (calc) (ref 1.0–2.5)
ALT: 74 U/L — ABNORMAL HIGH (ref 6–29)
AST: 89 U/L — ABNORMAL HIGH (ref 10–35)
Albumin: 4.4 g/dL (ref 3.6–5.1)
Alkaline phosphatase (APISO): 183 U/L — ABNORMAL HIGH (ref 37–153)
BUN: 13 mg/dL (ref 7–25)
CO2: 27 mmol/L (ref 20–32)
Calcium: 10.3 mg/dL (ref 8.6–10.4)
Chloride: 93 mmol/L — ABNORMAL LOW (ref 98–110)
Creat: 0.69 mg/dL (ref 0.60–0.93)
Globulin: 3.1 g/dL (calc) (ref 1.9–3.7)
Glucose, Bld: 140 mg/dL — ABNORMAL HIGH (ref 65–99)
Potassium: 3.7 mmol/L (ref 3.5–5.3)
Sodium: 132 mmol/L — ABNORMAL LOW (ref 135–146)
Total Bilirubin: 0.8 mg/dL (ref 0.2–1.2)
Total Protein: 7.5 g/dL (ref 6.1–8.1)

## 2019-11-10 LAB — CBC WITH DIFFERENTIAL/PLATELET
Absolute Monocytes: 621 cells/uL (ref 200–950)
Basophils Absolute: 70 cells/uL (ref 0–200)
Basophils Relative: 1.1 %
Eosinophils Absolute: 90 cells/uL (ref 15–500)
Eosinophils Relative: 1.4 %
HCT: 43.9 % (ref 35.0–45.0)
Hemoglobin: 15.5 g/dL (ref 11.7–15.5)
Lymphs Abs: 1562 cells/uL (ref 850–3900)
MCH: 33.5 pg — ABNORMAL HIGH (ref 27.0–33.0)
MCHC: 35.3 g/dL (ref 32.0–36.0)
MCV: 95 fL (ref 80.0–100.0)
MPV: 10.8 fL (ref 7.5–12.5)
Monocytes Relative: 9.7 %
Neutro Abs: 4058 cells/uL (ref 1500–7800)
Neutrophils Relative %: 63.4 %
Platelets: 177 10*3/uL (ref 140–400)
RBC: 4.62 10*6/uL (ref 3.80–5.10)
RDW: 11.8 % (ref 11.0–15.0)
Total Lymphocyte: 24.4 %
WBC: 6.4 10*3/uL (ref 3.8–10.8)

## 2019-11-10 MED ORDER — PROMETHAZINE HCL 25 MG PO TABS: 25.0000 mg | ORAL_TABLET | Freq: Three times a day (TID) | ORAL | 0 refills | Status: DC | PRN

## 2019-11-10 MED ORDER — OXYCODONE-ACETAMINOPHEN 5-325 MG PO TABS: 1.0000 | ORAL_TABLET | Freq: Four times a day (QID) | ORAL | 0 refills | Status: DC | PRN

## 2019-11-10 MED ORDER — CEPHALEXIN 500 MG PO CAPS: 500.0000 mg | ORAL_CAPSULE | Freq: Three times a day (TID) | ORAL | 0 refills | Status: DC

## 2019-11-14 ENCOUNTER — Telehealth: Payer: Self-pay | Admitting: *Deleted

## 2019-11-14 NOTE — Telephone Encounter (Signed)
DOS 11/15/2019 HAMMER TOE REPAIR 2ND - 13086 AND HALLUX MPJ FUSION - [id number] RIGHT FOOT  BCBS: Eligibility Date - 10/20/2019 - 10/18/9998   In-Network    Max Per Benefit Period Year-to-Date Remaining  CoInsurance  $4200.00 $4130.00  Deductible     Out-Of-Pocket  $4200.00 4130.00   [facility]  In Network  [employer] Coinsurance  $275.00 per VISITS  $4200.00   Benefit Limits: 4130.00 remaining for SERVICE YEAR

## 2019-11-15 ENCOUNTER — Encounter: Payer: Self-pay | Admitting: Podiatry

## 2019-11-15 DIAGNOSIS — M2011 Hallux valgus (acquired), right foot: Secondary | ICD-10-CM | POA: Diagnosis not present

## 2019-11-15 DIAGNOSIS — I10 Essential (primary) hypertension: Secondary | ICD-10-CM | POA: Diagnosis not present

## 2019-11-15 DIAGNOSIS — M2021 Hallux rigidus, right foot: Secondary | ICD-10-CM | POA: Diagnosis not present

## 2019-11-15 DIAGNOSIS — M25571 Pain in right ankle and joints of right foot: Secondary | ICD-10-CM | POA: Diagnosis not present

## 2019-11-15 DIAGNOSIS — M2041 Other hammer toe(s) (acquired), right foot: Secondary | ICD-10-CM | POA: Diagnosis not present

## 2019-11-20 ENCOUNTER — Other Ambulatory Visit: Payer: Self-pay | Admitting: *Deleted

## 2019-11-20 DIAGNOSIS — M2042 Other hammer toe(s) (acquired), left foot: Secondary | ICD-10-CM

## 2019-11-20 DIAGNOSIS — L02611 Cutaneous abscess of right foot: Secondary | ICD-10-CM

## 2019-11-20 DIAGNOSIS — M2021 Hallux rigidus, right foot: Secondary | ICD-10-CM

## 2019-11-20 DIAGNOSIS — M2041 Other hammer toe(s) (acquired), right foot: Secondary | ICD-10-CM

## 2019-11-20 DIAGNOSIS — L03031 Cellulitis of right toe: Secondary | ICD-10-CM

## 2019-11-20 DIAGNOSIS — L97511 Non-pressure chronic ulcer of other part of right foot limited to breakdown of skin: Secondary | ICD-10-CM

## 2019-11-20 NOTE — Progress Notes (Signed)
Per Dr. [name],  I put in an order for home health care and physical therapy for the patient.  I requested assistance from  Patsy [place], [place], for the care of our patient.

## 2019-11-20 NOTE — Progress Notes (Signed)
Subjective: 74 year old female presents the office today for preoperative evaluation.  She is scheduled for surgery next Wednesday.  She did do physical therapy preoperatively.  She has a walker at home and she states that she is ready for the surgery.  We are still awaiting medical clearance.  Otherwise she has been doing well and she has no questions at the surgery. Denies any systemic complaints such as fevers, chills, nausea, vomiting. No acute changes since last appointment, and no other complaints at this time.   Objective: AAO x3, NAD DP/PT pulses palpable bilaterally, CRT less than 3 seconds Overall exam is unchanged.  Overlapping however on the second toe with significant bunion deformity with restriction of motion as well as hammertoe deformity second digit. No pain with calf compression, swelling, warmth, erythema  Assessment: Bunion deformity, hammertoe  Plan: -All treatment options discussed with the patient including all alternatives, risks, complications.  -We can discuss the surgery as well as the postoperative course.  Discussed with surgery including all alternatives, risks, complications.  We will plan for surgery as scheduled including right foot first MPJ arthrodesis with second digit hammertoe repair and pinning along the MPJ. -Awaiting medical clearance. -Blood work ordered -She has no further questions or concerns today. -Patient encouraged to call the office with any questions, concerns, change in symptoms.   Trula Slade DPM

## 2019-11-21 DIAGNOSIS — Z7982 Long term (current) use of aspirin: Secondary | ICD-10-CM | POA: Diagnosis not present

## 2019-11-21 DIAGNOSIS — Z4789 Encounter for other orthopedic aftercare: Secondary | ICD-10-CM | POA: Diagnosis not present

## 2019-11-21 DIAGNOSIS — L03031 Cellulitis of right toe: Secondary | ICD-10-CM | POA: Diagnosis not present

## 2019-11-21 DIAGNOSIS — E11621 Type 2 diabetes mellitus with foot ulcer: Secondary | ICD-10-CM | POA: Diagnosis not present

## 2019-11-21 DIAGNOSIS — L97519 Non-pressure chronic ulcer of other part of right foot with unspecified severity: Secondary | ICD-10-CM | POA: Diagnosis not present

## 2019-11-21 DIAGNOSIS — M2041 Other hammer toe(s) (acquired), right foot: Secondary | ICD-10-CM | POA: Diagnosis not present

## 2019-11-21 DIAGNOSIS — M2042 Other hammer toe(s) (acquired), left foot: Secondary | ICD-10-CM | POA: Diagnosis not present

## 2019-11-21 DIAGNOSIS — M2021 Hallux rigidus, right foot: Secondary | ICD-10-CM | POA: Diagnosis not present

## 2019-11-21 DIAGNOSIS — I10 Essential (primary) hypertension: Secondary | ICD-10-CM | POA: Diagnosis not present

## 2019-11-22 ENCOUNTER — Telehealth: Payer: Self-pay | Admitting: Podiatry

## 2019-11-22 NOTE — Telephone Encounter (Signed)
This is Mickel Baas with [employer]. We have a referral here at [facility] for pt. Looks like pt has had some hammertoe surgery but we did not get the notes to go along with that. We only received the actual order and I'm trying to work on her authorization so she can receive services for nursing and PT. If you could fax the surgical notes over to [place] at [phone]. If you have any questions, you can call us at [phone]. Thank you.

## 2019-11-23 ENCOUNTER — Ambulatory Visit (INDEPENDENT_AMBULATORY_CARE_PROVIDER_SITE_OTHER): Payer: Medicare Other

## 2019-11-23 ENCOUNTER — Other Ambulatory Visit: Payer: Self-pay

## 2019-11-23 ENCOUNTER — Ambulatory Visit (INDEPENDENT_AMBULATORY_CARE_PROVIDER_SITE_OTHER): Payer: Medicare Other | Admitting: Podiatry

## 2019-11-23 DIAGNOSIS — M2021 Hallux rigidus, right foot: Secondary | ICD-10-CM | POA: Diagnosis not present

## 2019-11-23 MED ORDER — OXYCODONE-ACETAMINOPHEN 5-325 MG PO TABS: 1.0000 | ORAL_TABLET | Freq: Four times a day (QID) | ORAL | 0 refills | Status: DC | PRN

## 2019-11-30 ENCOUNTER — Encounter: Payer: Medicare Other | Admitting: Podiatry

## 2019-12-04 ENCOUNTER — Ambulatory Visit (INDEPENDENT_AMBULATORY_CARE_PROVIDER_SITE_OTHER): Payer: Medicare Other | Admitting: Podiatry

## 2019-12-04 ENCOUNTER — Other Ambulatory Visit: Payer: Self-pay

## 2019-12-04 DIAGNOSIS — M2042 Other hammer toe(s) (acquired), left foot: Secondary | ICD-10-CM

## 2019-12-04 DIAGNOSIS — M2021 Hallux rigidus, right foot: Secondary | ICD-10-CM

## 2019-12-04 DIAGNOSIS — M2041 Other hammer toe(s) (acquired), right foot: Secondary | ICD-10-CM

## 2019-12-05 ENCOUNTER — Telehealth: Payer: Self-pay

## 2019-12-05 NOTE — Telephone Encounter (Signed)
Renea-PT at [facility] called requesting clarification before any gait training for ambulation with a walker on the heel. Please advice

## 2019-12-06 ENCOUNTER — Telehealth: Payer: Self-pay | Admitting: *Deleted

## 2019-12-06 NOTE — Telephone Encounter (Signed)
For now she is NWB but she can apply weight to the heel for short distances/transfers.

## 2019-12-06 NOTE — Telephone Encounter (Signed)
Galen Daft from [place] called and started that the patient needs to have a home health nurse come out to the home due to patient had surgery on the foot and is no weight bearing at this time and per Dr [name] gave the ok for verbal order and I stated to [facility] if any concerns or questions to call the [place] office at [phone] and [employer] also stated that there would be a fax sent over for Dr [name] to sign. Lattie Haw

## 2019-12-06 NOTE — Telephone Encounter (Signed)
Called and spoke with Renea (PT) about the Dr's PT restrictions. Renea stated understanding

## 2019-12-14 ENCOUNTER — Encounter: Payer: Self-pay | Admitting: Podiatry

## 2019-12-14 ENCOUNTER — Ambulatory Visit (INDEPENDENT_AMBULATORY_CARE_PROVIDER_SITE_OTHER): Payer: Medicare Other

## 2019-12-14 ENCOUNTER — Other Ambulatory Visit: Payer: Self-pay

## 2019-12-14 ENCOUNTER — Ambulatory Visit (INDEPENDENT_AMBULATORY_CARE_PROVIDER_SITE_OTHER): Payer: Medicare Other | Admitting: Podiatry

## 2019-12-14 VITALS — Temp 98.1°F | Resp 16

## 2019-12-14 DIAGNOSIS — M2042 Other hammer toe(s) (acquired), left foot: Secondary | ICD-10-CM

## 2019-12-14 DIAGNOSIS — M2021 Hallux rigidus, right foot: Secondary | ICD-10-CM | POA: Diagnosis not present

## 2019-12-14 DIAGNOSIS — M2041 Other hammer toe(s) (acquired), right foot: Secondary | ICD-10-CM

## 2019-12-14 NOTE — Progress Notes (Signed)
Subjective: Elizabeth Stone is a 74 y.o. is seen today in office s/p right 1st MTPJ arthrodesis and 2nd hammertoe repair preformed on 11/15/2019.  She states that overall she is doing well and her pain is better controlled.  She has been dressing her upper but is much as possible and she been wearing the cam boot.  She was put weight on it but is more to the heel.  Denies any systemic complaints such as fevers, chills, nausea, vomiting. No calf pain, chest pain, shortness of breath.   Objective: General: No acute distress, AAOx3  DP/PT pulses palpable 2/4, CRT < 3 sec to all digits.  Protective sensation intact. Motor function intact.  RIGHT foot: Incision is well coapted without any evidence of dehiscence with sutures intact and also a K wire intact the second digit. There is no surrounding erythema, ascending cellulitis, fluctuance, crepitus, malodor, drainage/purulence. There is mild edema around the surgical site. There is minimal pain along the surgical site.  Toes in rectus position No other areas of tenderness to bilateral lower extremities.  No other open lesions or pre-ulcerative lesions.  No pain with calf compression, swelling, warmth, erythema.   Assessment and Plan:  Status post right foot surgery, doing well with no complications   -Treatment options discussed including all alternatives, risks, and complications -Reviewed the x-rays from last appointment.  Hardware intact. -Removed a few of the sutures today.  Antibiotic ointment and a dressing applied.  Keep the dressing clean, dry, intact -Remain nonweightbearing but she has to clear her foot more to be heal but always in a cam boot. -81 mg aspirin -Ice/elevation -Pain medication as needed. -Monitor for any clinical signs or symptoms of infection and DVT/PE and directed to call the office immediately should any occur or go to the ER. -Follow-up as scheduled for suture removal and repeat x-ray or sooner if any problems arise. In the  meantime, encouraged to call the office with any questions, concerns, change in symptoms.   Celesta Gentile, DPM

## 2019-12-14 NOTE — Progress Notes (Signed)
Subjective: Elizabeth Stone is a 74 y.o. is seen today in office s/p right 1st MTPJ arthrodesis and 2nd hammertoe repair preformed on 11/15/2019.  She presents here for suture removal.  Overall she states that she is doing well and her pain is much better controlled no significant pain.  She still in the cam boot.  She is open to get back into regular shoe. Denies any systemic complaints such as fevers, chills, nausea, vomiting. No calf pain, chest pain, shortness of breath.   Objective: General: No acute distress, AAOx3  DP/PT pulses palpable 2/4, CRT < 3 sec to all digits.  Protective sensation intact. Motor function intact.  RIGHT foot: Incision is well coapted without any evidence of dehiscence with sutures intact and also a K wire intact the second digit. There is no surrounding erythema, ascending cellulitis, fluctuance, crepitus, malodor, drainage/purulence. There is mild edema around the surgical site. There is minimal pain along the surgical site.  Toes in rectus position.  Overall the incision appears to be healing well with any problems. No other areas of tenderness to bilateral lower extremities.  No other open lesions or pre-ulcerative lesions.  No pain with calf compression, swelling, warmth, erythema.   Assessment and Plan:  Status post right foot surgery, doing well with no complications   -Treatment options discussed including all alternatives, risks, and complications -I will remainder the sutures today without any complications the incision is well-healed.  Antibiotic ointment and a dressing applied.  I left the K wire intact.  We will x-ray next appointment removal of K wire.  Continue in cam boot nonweightbearing. -Monitor for any clinical signs or symptoms of infection and directed to call the office immediately should any occur or go to the ER.  Return in about 1 week (around 12/21/2019).  Repeat x-ray and likely pin removal  Trula Slade DPM

## 2019-12-15 ENCOUNTER — Telehealth: Payer: Self-pay | Admitting: *Deleted

## 2019-12-15 ENCOUNTER — Ambulatory Visit: Payer: Medicare Other

## 2019-12-15 NOTE — Telephone Encounter (Signed)
[place] states pt was not available for [facility] visit, and she would like verbal orders to see pt once a week for 4 more weeks.

## 2019-12-16 NOTE — Telephone Encounter (Signed)
That is OK to do once a week for 4 more weeks.

## 2019-12-17 NOTE — Progress Notes (Signed)
  Subjective:  Patient ID: Elizabeth Stone, female    DOB: 1946-09-14,  MRN: [id number]  Chief Complaint  Patient presents with  . Routine Post Op    DOS 01.27.2021 Hammertoe Repair 2nd and Hallux MPJ Fusion Rt. Pt states     DOS: 11/14/2018 Procedure: Right 1st MPJ fusion, 2nd hammertoe correction Elizabeth Stone)  74 y.o. female presents with the above complaint. History confirmed with patient.  Doing very well denies pains or postop concerns  Objective:  Physical Exam: tenderness at the surgical site, local edema noted, calf supple, nontender and toes rectus. Incision: healing well, no significant drainage, no dehiscence, no significant erythema  No images are attached to the encounter.  Radiographs: X-ray of the right foot: Consistent with postop state good consolidation across the first metatarsophalangeal joint noted no evidence of hardware failure   Assessment:   1. Hallux rigidus of right foot     Plan:  Patient was evaluated and treated and all questions answered.  Post-operative State -XR reviewed with patient -Dressing applied consisting of sterile gauze, kerlix and ACE bandage -WBAT in CAM boot -Pain medication refilled  No follow-ups on file.

## 2019-12-18 ENCOUNTER — Ambulatory Visit: Payer: Medicare Other | Attending: Internal Medicine

## 2019-12-18 DIAGNOSIS — Z23 Encounter for immunization: Secondary | ICD-10-CM | POA: Insufficient documentation

## 2019-12-18 NOTE — Telephone Encounter (Signed)
Left message informing [facility] of Dr. Leigh Aurora 12/16/2019 9:42am orders.

## 2019-12-18 NOTE — Progress Notes (Signed)
   Covid-19 Vaccination Clinic  Name:  ARBOR STORMONT    MRN: [id number] DOB: 1946-04-10  12/18/2019  Ms. Fobes was observed post Covid-19 immunization for 15 minutes without incidence. She was provided with Vaccine Information Sheet and instruction to access the V-Safe system.   Ms. Estime was instructed to call 911 with any severe reactions post vaccine: Marland Kitchen Difficulty breathing  . Swelling of your face and throat  . A fast heartbeat  . A bad rash all over your body  . Dizziness and weakness    Immunizations Administered    Name Date Dose VIS Date Route   Pfizer COVID-19 Vaccine 12/18/2019  8:18 AM 0.3 mL 09/29/2019 Intramuscular   Manufacturer: [place]   Lot: [id number]   [place]: [id number]

## 2019-12-19 DIAGNOSIS — G894 Chronic pain syndrome: Secondary | ICD-10-CM | POA: Diagnosis not present

## 2019-12-19 DIAGNOSIS — M1990 Unspecified osteoarthritis, unspecified site: Secondary | ICD-10-CM | POA: Diagnosis not present

## 2019-12-19 DIAGNOSIS — E782 Mixed hyperlipidemia: Secondary | ICD-10-CM | POA: Diagnosis not present

## 2019-12-19 DIAGNOSIS — E1169 Type 2 diabetes mellitus with other specified complication: Secondary | ICD-10-CM | POA: Diagnosis not present

## 2019-12-21 DIAGNOSIS — Z4789 Encounter for other orthopedic aftercare: Secondary | ICD-10-CM | POA: Diagnosis not present

## 2019-12-21 DIAGNOSIS — Z7982 Long term (current) use of aspirin: Secondary | ICD-10-CM | POA: Diagnosis not present

## 2019-12-21 DIAGNOSIS — L97519 Non-pressure chronic ulcer of other part of right foot with unspecified severity: Secondary | ICD-10-CM | POA: Diagnosis not present

## 2019-12-21 DIAGNOSIS — M2041 Other hammer toe(s) (acquired), right foot: Secondary | ICD-10-CM | POA: Diagnosis not present

## 2019-12-21 DIAGNOSIS — M2021 Hallux rigidus, right foot: Secondary | ICD-10-CM | POA: Diagnosis not present

## 2019-12-21 DIAGNOSIS — I10 Essential (primary) hypertension: Secondary | ICD-10-CM | POA: Diagnosis not present

## 2019-12-21 DIAGNOSIS — M2042 Other hammer toe(s) (acquired), left foot: Secondary | ICD-10-CM | POA: Diagnosis not present

## 2019-12-21 DIAGNOSIS — L03031 Cellulitis of right toe: Secondary | ICD-10-CM | POA: Diagnosis not present

## 2019-12-21 DIAGNOSIS — E11621 Type 2 diabetes mellitus with foot ulcer: Secondary | ICD-10-CM | POA: Diagnosis not present

## 2019-12-24 DIAGNOSIS — M2041 Other hammer toe(s) (acquired), right foot: Secondary | ICD-10-CM | POA: Insufficient documentation

## 2019-12-24 DIAGNOSIS — M2021 Hallux rigidus, right foot: Secondary | ICD-10-CM | POA: Insufficient documentation

## 2019-12-25 ENCOUNTER — Other Ambulatory Visit: Payer: Self-pay

## 2019-12-25 ENCOUNTER — Ambulatory Visit (INDEPENDENT_AMBULATORY_CARE_PROVIDER_SITE_OTHER): Payer: Medicare Other | Admitting: Podiatry

## 2019-12-25 ENCOUNTER — Ambulatory Visit (INDEPENDENT_AMBULATORY_CARE_PROVIDER_SITE_OTHER): Payer: Medicare Other

## 2019-12-25 ENCOUNTER — Encounter: Payer: Self-pay | Admitting: Podiatry

## 2019-12-25 VITALS — BP 114/87 | HR 53 | Temp 97.0°F | Resp 14

## 2019-12-25 DIAGNOSIS — M2041 Other hammer toe(s) (acquired), right foot: Secondary | ICD-10-CM

## 2019-12-25 DIAGNOSIS — M2042 Other hammer toe(s) (acquired), left foot: Secondary | ICD-10-CM | POA: Diagnosis not present

## 2019-12-25 DIAGNOSIS — M2021 Hallux rigidus, right foot: Secondary | ICD-10-CM | POA: Diagnosis not present

## 2019-12-26 NOTE — Progress Notes (Signed)
Subjective: Elizabeth Stone is a 74 y.o. is seen today in office s/p right 1st MTPJ arthrodesis and 2nd hammertoe repair preformed on 11/15/2019.  She presents today for pin removal.  She states that overall she is doing well and not any significant pain.  She still wearing the cam boot she try to limit her walking.  She has had to do more walking recently she reports.  No recent injury or falls. Denies any systemic complaints such as fevers, chills, nausea, vomiting. No calf pain, chest pain, shortness of breath.   Objective: General: No acute distress, AAOx3  DP/PT pulses palpable 2/4, CRT < 3 sec to all digits.  Protective sensation intact. Motor function intact.  RIGHT foot: Incision is well coapted without any evidence of dehiscence with sutures intact and also a K wire intact the second digit. There is no surrounding erythema, ascending cellulitis, fluctuance, crepitus, malodor, drainage/purulence.  No obvious signs of infection.  Minimal swelling to the foot no significant pain. No other areas of tenderness to bilateral lower extremities.  No other open lesions or pre-ulcerative lesions.  No pain with calf compression, swelling, warmth, erythema.   Assessment and Plan:  Status post right foot surgery, doing well with no complications   -Treatment options discussed including all alternatives, risks, and complications -X-rays obtained and reviewed.  Hardware intact the first metatarsal plantar joint arthrodesis site with increased consolidation.  K wire to the second toe. -I cleaned the K wire.  I start remove the wire due to the pain I did anesthetize the toe with 3 cc of lidocaine, Marcaine plain.  Once anesthetized the wire was removed in total without complications. -Only remain in the cam boot for now.  She can start to shower tomorrow.  Over the next week she can slowly start to transition back into regular shoe as she feels more comfortable.  I want her to continue to ice elevate as  well. -I also dispensed a surgical shoe that she can wear as she is transitioning out of the CAM boot.    Trula Slade DPM

## 2019-12-28 ENCOUNTER — Telehealth: Payer: Self-pay | Admitting: *Deleted

## 2019-12-28 NOTE — Telephone Encounter (Signed)
She can start to transition to [facility] in CAM boot/surgical shoe. If any increase in pain, swelling return to [facility]. Thanks.

## 2019-12-28 NOTE — Telephone Encounter (Signed)
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

## 2019-12-29 NOTE — Telephone Encounter (Signed)
Left message informing [place], PT tech of Dr. Leigh Aurora 12/28/2019 5:40pm orders.

## 2020-01-08 ENCOUNTER — Encounter: Payer: Self-pay | Admitting: Podiatry

## 2020-01-08 ENCOUNTER — Ambulatory Visit (INDEPENDENT_AMBULATORY_CARE_PROVIDER_SITE_OTHER): Payer: Medicare Other | Admitting: Podiatry

## 2020-01-08 ENCOUNTER — Other Ambulatory Visit: Payer: Self-pay

## 2020-01-08 VITALS — Temp 96.7°F

## 2020-01-08 DIAGNOSIS — M2021 Hallux rigidus, right foot: Secondary | ICD-10-CM

## 2020-01-08 DIAGNOSIS — M2041 Other hammer toe(s) (acquired), right foot: Secondary | ICD-10-CM

## 2020-01-14 NOTE — Progress Notes (Signed)
Subjective: Elizabeth Stone is a 74 y.o. is seen today in office s/p right 1st MTPJ arthrodesis and 2nd hammertoe repair preformed on 11/15/2019.  She states that she is doing good.  No significant pain but still not able to wear regular shoe because of some swelling.  She has been walking in a surgical shoe.  No recent injury or falls.  No other concerns. Denies any systemic complaints such as fevers, chills, nausea, vomiting. No calf pain, chest pain, shortness of breath.   Objective: General: No acute distress, AAOx3  DP/PT pulses palpable 2/4, CRT < 3 sec to all digits.  Protective sensation intact. Motor function intact.  RIGHT foot: Incision is well coapted without any evidence of dehiscence and scars are well formed.  Toes are in rectus position.  Mild swelling to the second toe.  No significant tenderness palpation along the surgical sites. No other open lesions or pre-ulcerative lesions.  No pain with calf compression, swelling, warmth, erythema.   Assessment and Plan:  Status post right foot surgery, doing well with no complications   -Treatment options discussed including all alternatives, risks, and complications -Overall she seems to be doing well.  Discussed with her how to tape the toe in order to help with swelling.  Compression anklet.  Continue ice elevate.  Discussed gradual transition to regular shoe.  Return in about 4 weeks (around 02/05/2020).  X-ray next appointment  Trula Slade DPM

## 2020-01-16 ENCOUNTER — Ambulatory Visit: Payer: Medicare Other | Attending: Internal Medicine

## 2020-01-16 DIAGNOSIS — Z23 Encounter for immunization: Secondary | ICD-10-CM

## 2020-01-16 NOTE — Progress Notes (Signed)
   Covid-19 Vaccination Clinic  Name:  Elizabeth Stone    MRN: [id number] DOB: 1945-11-11  01/16/2020  [name] was observed post Covid-19 immunization for 15 minutes without incident. She was provided with Vaccine Information Sheet and instruction to access the V-Safe system.   [name] was instructed to call 911 with any severe reactions post vaccine: Marland Kitchen Difficulty breathing  . Swelling of face and throat  . A fast heartbeat  . A bad rash all over body  . Dizziness and weakness   Immunizations Administered    Name Date Dose VIS Date Route   Pfizer COVID-19 Vaccine 01/16/2020  8:19 AM 0.3 mL 09/29/2019 Intramuscular   Manufacturer: [place]   Lot: [id number]   [place]: [id number]

## 2020-01-24 DIAGNOSIS — E119 Type 2 diabetes mellitus without complications: Secondary | ICD-10-CM | POA: Diagnosis not present

## 2020-01-30 DIAGNOSIS — M1990 Unspecified osteoarthritis, unspecified site: Secondary | ICD-10-CM | POA: Diagnosis not present

## 2020-01-30 DIAGNOSIS — Z6831 Body mass index (BMI) 31.0-31.9, adult: Secondary | ICD-10-CM | POA: Diagnosis not present

## 2020-01-30 DIAGNOSIS — E119 Type 2 diabetes mellitus without complications: Secondary | ICD-10-CM | POA: Diagnosis not present

## 2020-02-05 DIAGNOSIS — F3181 Bipolar II disorder: Secondary | ICD-10-CM | POA: Diagnosis not present

## 2020-02-05 DIAGNOSIS — F1010 Alcohol abuse, uncomplicated: Secondary | ICD-10-CM | POA: Diagnosis not present

## 2020-02-08 ENCOUNTER — Other Ambulatory Visit: Payer: Self-pay

## 2020-02-08 ENCOUNTER — Ambulatory Visit (INDEPENDENT_AMBULATORY_CARE_PROVIDER_SITE_OTHER): Payer: Medicare Other | Admitting: Podiatry

## 2020-02-08 ENCOUNTER — Ambulatory Visit (INDEPENDENT_AMBULATORY_CARE_PROVIDER_SITE_OTHER): Payer: Medicare Other

## 2020-02-08 VITALS — Temp 97.3°F

## 2020-02-08 DIAGNOSIS — M2041 Other hammer toe(s) (acquired), right foot: Secondary | ICD-10-CM

## 2020-02-08 DIAGNOSIS — M2042 Other hammer toe(s) (acquired), left foot: Secondary | ICD-10-CM

## 2020-02-09 NOTE — Progress Notes (Signed)
Subjective: Elizabeth Stone is a 74 y.o. is seen today in office s/p right 1st MTPJ arthrodesis and 2nd hammertoe repair preformed on 11/15/2019.  She is back she states that the right second toe is leaning some but not causing any significant pain.  She has no new concerns. Denies any systemic complaints such as fevers, chills, nausea, vomiting. No calf pain, chest pain, shortness of breath.   Objective: General: No acute distress, AAOx3  DP/PT pulses palpable 2/4, CRT < 3 sec to all digits.  Protective sensation intact. Motor function intact.  RIGHT foot: Incision is well coapted without any evidence of dehiscence and scars are well formed.  Hallux is in rectus position.  Mild contracture present of the second toe.  There is no pain to the surgical sites.  There is no erythema or any signs of infection.  Minimal edema to the second toe surgical site along the PIPJ. No other open lesions or pre-ulcerative lesions.  No pain with calf compression, swelling, warmth, erythema.   Assessment and Plan:  Status post right foot surgery  -Treatment options discussed including all alternatives, risks, and complications -Repeat x-rays were obtained and reviewed.  Hardware intact of the first MPJ.  There is transverse plane deformity of the second digit. -I dispensed offloading pads for her.  Dispensed a toe separator, toe crest as well as the toe.  She can see what works best for her.  Continue wearing regular shoes.  As she is back to regular shoes and back to normal activities and discharged with a postoperative course.  Encouraged to call any questions or concerns or any changes.  She will follow up next year for likely surgery in the left foot.  Return if symptoms worsen or fail to improve.  Trula Slade DPM

## 2020-02-14 DIAGNOSIS — M48061 Spinal stenosis, lumbar region without neurogenic claudication: Secondary | ICD-10-CM | POA: Diagnosis not present

## 2020-02-14 DIAGNOSIS — M5416 Radiculopathy, lumbar region: Secondary | ICD-10-CM | POA: Diagnosis not present

## 2020-03-07 DIAGNOSIS — M85851 Other specified disorders of bone density and structure, right thigh: Secondary | ICD-10-CM | POA: Diagnosis not present

## 2020-03-07 DIAGNOSIS — Z780 Asymptomatic menopausal state: Secondary | ICD-10-CM | POA: Diagnosis not present

## 2020-03-07 DIAGNOSIS — Z1231 Encounter for screening mammogram for malignant neoplasm of breast: Secondary | ICD-10-CM | POA: Diagnosis not present

## 2020-03-11 DIAGNOSIS — F3181 Bipolar II disorder: Secondary | ICD-10-CM | POA: Diagnosis not present

## 2020-03-14 DIAGNOSIS — E113291 Type 2 diabetes mellitus with mild nonproliferative diabetic retinopathy without macular edema, right eye: Secondary | ICD-10-CM | POA: Diagnosis not present

## 2020-03-14 DIAGNOSIS — H25043 Posterior subcapsular polar age-related cataract, bilateral: Secondary | ICD-10-CM | POA: Diagnosis not present

## 2020-03-14 DIAGNOSIS — H2513 Age-related nuclear cataract, bilateral: Secondary | ICD-10-CM | POA: Diagnosis not present

## 2020-03-14 DIAGNOSIS — H25013 Cortical age-related cataract, bilateral: Secondary | ICD-10-CM | POA: Diagnosis not present

## 2020-03-20 DIAGNOSIS — G894 Chronic pain syndrome: Secondary | ICD-10-CM | POA: Diagnosis not present

## 2020-03-20 DIAGNOSIS — R0600 Dyspnea, unspecified: Secondary | ICD-10-CM | POA: Diagnosis not present

## 2020-03-29 ENCOUNTER — Telehealth: Payer: Self-pay | Admitting: Emergency Medicine

## 2020-03-29 NOTE — Telephone Encounter (Signed)
Spoke with the pt  She is requesting sooner appt with Dr Lamonte Sakai  I advised there are no sooner appts  She states that she is in no acute distress  Feels okay waiting for appt, just concerned bc her daughter just dx with PAH  I advised seek emergent care if needed

## 2020-04-05 DIAGNOSIS — M48062 Spinal stenosis, lumbar region with neurogenic claudication: Secondary | ICD-10-CM | POA: Diagnosis not present

## 2020-04-05 DIAGNOSIS — M545 Low back pain: Secondary | ICD-10-CM | POA: Diagnosis not present

## 2020-04-09 DIAGNOSIS — H25812 Combined forms of age-related cataract, left eye: Secondary | ICD-10-CM | POA: Diagnosis not present

## 2020-04-09 DIAGNOSIS — H2512 Age-related nuclear cataract, left eye: Secondary | ICD-10-CM | POA: Diagnosis not present

## 2020-04-17 DIAGNOSIS — L821 Other seborrheic keratosis: Secondary | ICD-10-CM | POA: Diagnosis not present

## 2020-04-17 DIAGNOSIS — L820 Inflamed seborrheic keratosis: Secondary | ICD-10-CM | POA: Diagnosis not present

## 2020-04-29 DIAGNOSIS — R2689 Other abnormalities of gait and mobility: Secondary | ICD-10-CM | POA: Diagnosis not present

## 2020-04-30 ENCOUNTER — Other Ambulatory Visit: Payer: Self-pay

## 2020-04-30 ENCOUNTER — Ambulatory Visit (INDEPENDENT_AMBULATORY_CARE_PROVIDER_SITE_OTHER): Payer: Medicare Other

## 2020-04-30 ENCOUNTER — Encounter: Payer: Self-pay | Admitting: Emergency Medicine

## 2020-04-30 ENCOUNTER — Ambulatory Visit: Payer: Medicare Other | Admitting: Emergency Medicine

## 2020-04-30 DIAGNOSIS — R0600 Dyspnea, unspecified: Secondary | ICD-10-CM | POA: Diagnosis not present

## 2020-04-30 DIAGNOSIS — R0609 Other forms of dyspnea: Secondary | ICD-10-CM

## 2020-04-30 DIAGNOSIS — R0602 Shortness of breath: Secondary | ICD-10-CM | POA: Diagnosis not present

## 2020-04-30 NOTE — Progress Notes (Signed)
Subjective:    Patient ID: Elizabeth Stone, female    DOB: 1945-12-28, 74 y.o.   MRN: [id number]  HPI 74 year old former smoker (15 pack years) with diabetes, hyperlipidemia, bipolar, IBS, fibromyalgia, Mnire's disease with poor hearing, GERD, spinal stenosis and chronic pain with opiate use.  She is here today to evaluate dyspnea.  She has had some shortness of breath for over 1 year with activity.  Underwent cardiac evaluation which was reassuring, with a low risk pharmacologic stress test.  A CT-PA was done 08/12/2018 to evaluate dyspnea, I have reviewed, shows no evidence of pulmonary embolism, small hiatal hernia, some curvature of the spine, no pulmonary infiltrates.  She had a stable 4 mm pulmonary nodule in the right major fissure.  Today she reports that she has SOB with minimal exertion, even showering. She can do some light house work, cooking. Dyspnea is worse with bending, has trouble when singing at church or when conversing. She denies any CP. She has some cough, often happens in the evening. May have improved some when she started Nexium. She can hear some wheeze even at rest. She has gained about 30 lbs over the last 2 yrs, now has very limited exercise.   Her daughter was recently diagnosed with PAH    CBC    Component Value Date/Time   WBC 6.4 11/10/2019 1349   RBC 4.62 11/10/2019 1349   HGB 15.5 11/10/2019 1349   HCT 43.9 11/10/2019 1349   PLT 177 11/10/2019 1349   MCV 95.0 11/10/2019 1349   MCH 33.5 (H) 11/10/2019 1349   MCHC 35.3 11/10/2019 1349   RDW 11.8 11/10/2019 1349   LYMPHSABS 1,562 11/10/2019 1349   MONOABS 0.7 11/21/2018 1539   EOSABS 90 11/10/2019 1349   BASOSABS 70 11/10/2019 1349      Review of Systems As per HPI  Past Medical History:  Diagnosis Date  . Acute blood loss anemia    hx  . Anxiety   . Arthritis   . Bipolar disorder ([place])   . Cochlear implant in place   . Depression   . Dermatitis    on face  . Dyspnea   . Fibromyalgia     . GERD (gastroesophageal reflux disease)   . Hearing impaired    has cochlear implant  . HTN (hypertension)   . Hypokalemia   . Meniere disease    takes HCTZ for her inner ear problem  . Mixed hyperlipidemia 09/27/2013  . Osteoporosis   . Pneumonia 11/2016  . Pre-diabetes   . Primary localized osteoarthritis of left knee 11/16/2018  . Primary localized osteoarthritis of right knee   . Slow transit constipation   . Unsteady gait      Family History  Problem Relation Age of Onset  . Heart disease Mother   . Breast cancer Mother   . Cervical cancer Mother   . Diabetes Mother   . Heart disease Father   . Diabetes Maternal Aunt   . Colon cancer Neg Hx   . Esophageal cancer Neg Hx   . Rectal cancer Neg Hx   . Stomach cancer Neg Hx   . Liver cancer Neg Hx   . Pancreatic cancer Neg Hx   PAH, daughter  Social History   Socioeconomic History  . Marital status: Widowed    Spouse name: Not on file  . Number of children: Not on file  . Years of education: Not on file  . Highest education level: Not on file  Occupational History  . Not on file  Tobacco Use  . Smoking status: Former Smoker    Packs/day: 0.25    Years: 23.00    Pack years: 5.75    Quit date: 08/25/1993    Years since quitting: 26.6  . Smokeless tobacco: Never Used  Vaping Use  . Vaping Use: Never used  Substance and Sexual Activity  . Alcohol use: Yes    Alcohol/week: 10.0 standard drinks    Types: 10 Shots of liquor per week    Comment: none since christmas  . Drug use: No  . Sexual activity: Not on file  Other Topics Concern  . Not on file  Social History Narrative  . Not on file   Social Determinants of Health   Financial Resource Strain:   . Difficulty of Paying Living Expenses:   Food Insecurity:   . Worried About Charity fundraiser in the Last Year:   . Arboriculturist in the Last Year:   Transportation Needs:   . Film/video editor (Medical):   Marland Kitchen Lack of Transportation  (Non-Medical):   Physical Activity:   . Days of Exercise per Week:   . Minutes of Exercise per Session:   Stress:   . Feeling of Stress :   Social Connections:   . Frequency of Communication with Friends and Family:   . Frequency of Social Gatherings with Friends and Family:   . Attends Religious Services:   . Active Member of Clubs or Organizations:   . Attends Archivist Meetings:   Marland Kitchen Marital Status:   Intimate Partner Violence:   . Fear of Current or Ex-Partner:   . Emotionally Abused:   Marland Kitchen Physically Abused:   . 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 Adhesive [Tape] Other (See Comments)    PT STATED ALLERGY TO TAPE, NOT LATEX GLOVES AS PREVIOUSLY DOCUMENTED (Pt prefers paper tape.)     Outpatient Medications Prior to Visit  Medication Sig Dispense Refill  . acetaminophen (TYLENOL) 500 MG tablet Take 1,000 mg by mouth every 6 (six) hours as needed for mild pain (for pain.).     Marland Kitchen aspirin 81 MG chewable tablet Chew 81 mg by mouth daily.    Marland Kitchen atorvastatin (LIPITOR) 10 MG tablet Take 10 mg by mouth at bedtime.     Marland Kitchen buPROPion (WELLBUTRIN XL) 300 MG 24 hr tablet Take 300 mg by mouth daily.    . Calcium Carbonate-Vit D-Min (CALCIUM 1200 PO) Take 1 tablet by mouth at bedtime.    . Cholecalciferol (VITAMIN D3) 50 MCG (2000 UT) TABS Take 2,000 Units by mouth at bedtime.    . clonazePAM (KLONOPIN) 0.5 MG tablet Take 1 mg by mouth at bedtime.    . clonazePAM (KLONOPIN) 1 MG tablet Take 1 mg by mouth 2 (two) times daily as needed.    . hydrochlorothiazide (HYDRODIURIL) 25 MG tablet Take 25 mg by mouth Daily.     Marland Kitchen HYDROcodone-acetaminophen (NORCO) 10-325 MG tablet Take 1 tablet by mouth every 12 (twelve) hours as needed.    Marland Kitchen KLOR-CON M20 20 MEQ tablet Take 20 mEq by mouth daily.     Marland Kitchen lamoTRIgine (LAMICTAL) 200 MG tablet Take 200 mg by mouth at bedtime.     . montelukast (SINGULAIR) 10 MG tablet Take 10 mg by mouth  at bedtime.     Marland Kitchen oxyCODONE-acetaminophen (PERCOCET/ROXICET) 5-325 MG tablet Take  1-2 tablets by mouth every 6 (six) hours as needed for severe pain. 30 tablet 0  . amoxicillin (AMOXIL) 500 MG capsule  (Patient not taking: Reported on 04/30/2020)    . Asenapine Maleate 10 MG SUBL Place 10 mg under the tongue at bedtime.    . cephALEXin (KEFLEX) 500 MG capsule Take 1 capsule (500 mg total) by mouth 3 (three) times daily. (Patient not taking: Reported on 04/30/2020) 21 capsule 0  . diclofenac (VOLTAREN) 75 MG EC tablet Take 75 mg by mouth daily. (Patient not taking: Reported on 04/30/2020)    . docusate sodium (COLACE) 100 MG capsule 1 tab 2 times a day while on narcotics.  STOOL SOFTENER 60 capsule 0  . DULoxetine (CYMBALTA) 60 MG capsule Take 60 mg by mouth 2 (two) times daily.     Marland Kitchen gabapentin (NEURONTIN) 300 MG capsule 1 po qhs for nerve pain (Patient not taking: Reported on 04/30/2020) 30 capsule 0  . methylPREDNISolone (MEDROL DOSEPAK) 4 MG TBPK tablet SMARTSIG:0 Tablet(s) By Mouth As Directed (Patient not taking: Reported on 04/30/2020)    . predniSONE (STERAPRED UNI-PAK 21 TAB) 10 MG (21) TBPK tablet  (Patient not taking: Reported on 04/30/2020)    . promethazine (PHENERGAN) 25 MG tablet Take 1 tablet (25 mg total) by mouth every 8 (eight) hours as needed for nausea or vomiting. (Patient not taking: Reported on 04/30/2020) 20 tablet 0   No facility-administered medications prior to visit.        Objective:   Physical Exam Vitals:   04/30/20 1541  BP: 132/68  Pulse: 84  Temp: 98.4 F (36.9 C)  TempSrc: Oral  SpO2: 94%  Weight: 165 lb 12.8 oz (75.2 kg)  Height: 5\' 1"  (1.549 m)   Gen: Pleasant, obese elderly woman, in no distress,  normal affect  ENT: No lesions,  mouth clear,  oropharynx clear, no postnasal drip  Neck: No JVD, no stridor  Lungs: No use of accessory muscles, no crackles or wheezing on normal respiration, few end inspiratory squeaks bilaterally  Cardiovascular: RRR,  heart sounds normal, no murmur or gallops, no peripheral edema  Musculoskeletal: No deformities, no cyanosis or clubbing  Neuro: alert, awake, non focal, poor hearing  Skin: Warm, no lesions or rash     Assessment & Plan:  Dyspnea on exertion Slow progressive.  Likely multifactorial.  She has had a reassuring cardiac evaluation that included stress test and echocardiogram.  Suspect some degree of restriction given her weight gain.  Also deconditioning as her activity has been limited by chronic pain.  Her narcotics are probably contributing some to hypoventilation.  She is a former smoker and we need to evaluate for obstructive lung disease as well.  She may be a candidate for bronchodilators going forward.  Consider walking oximetry to screen for occult desaturation but her ability to walk is very limited due to her spinal stenosis so I will defer today.  Chest x-ray today We will perform full pulmonary function testing at next office visit Follow with Dr. Lamonte Sakai next available with full pulmonary function testing on the same da  Baltazar Apo, MD, PhD 04/30/2020, 4:03 PM [facility] [phone] or if no answer [phone]

## 2020-04-30 NOTE — Patient Instructions (Addendum)
Chest x-ray today We will perform full pulmonary function testing at next office visit Follow with Dr. Lamonte Sakai next available with full pulmonary function testing on the same day.

## 2020-04-30 NOTE — Assessment & Plan Note (Signed)
Slow progressive.  Likely multifactorial.  She has had a reassuring cardiac evaluation that included stress test and echocardiogram.  Suspect some degree of restriction given her weight gain.  Also deconditioning as her activity has been limited by chronic pain.  Her narcotics are probably contributing some to hypoventilation.  She is a former smoker and we need to evaluate for obstructive lung disease as well.  She may be a candidate for bronchodilators going forward.  Consider walking oximetry to screen for occult desaturation but her ability to walk is very limited due to her spinal stenosis so I will defer today.  Chest x-ray today We will perform full pulmonary function testing at next office visit Follow with Dr. Lamonte Sakai next available with full pulmonary function testing on the same da

## 2020-04-30 NOTE — Addendum Note (Signed)
Addended by: Gavin Potters R on: 04/30/2020 04:12 PM   Modules accepted: Orders

## 2020-05-08 DIAGNOSIS — H2511 Age-related nuclear cataract, right eye: Secondary | ICD-10-CM | POA: Diagnosis not present

## 2020-05-08 DIAGNOSIS — H25011 Cortical age-related cataract, right eye: Secondary | ICD-10-CM | POA: Diagnosis not present

## 2020-05-08 DIAGNOSIS — H25041 Posterior subcapsular polar age-related cataract, right eye: Secondary | ICD-10-CM | POA: Diagnosis not present

## 2020-05-21 DIAGNOSIS — M48061 Spinal stenosis, lumbar region without neurogenic claudication: Secondary | ICD-10-CM | POA: Diagnosis not present

## 2020-05-21 DIAGNOSIS — M545 Low back pain: Secondary | ICD-10-CM | POA: Diagnosis not present

## 2020-05-21 DIAGNOSIS — M47816 Spondylosis without myelopathy or radiculopathy, lumbar region: Secondary | ICD-10-CM | POA: Diagnosis not present

## 2020-06-12 DIAGNOSIS — M48061 Spinal stenosis, lumbar region without neurogenic claudication: Secondary | ICD-10-CM | POA: Diagnosis not present

## 2020-06-12 DIAGNOSIS — M47816 Spondylosis without myelopathy or radiculopathy, lumbar region: Secondary | ICD-10-CM | POA: Diagnosis not present

## 2020-06-12 DIAGNOSIS — M545 Low back pain: Secondary | ICD-10-CM | POA: Diagnosis not present

## 2020-06-13 ENCOUNTER — Ambulatory Visit: Payer: Medicare Other | Admitting: Emergency Medicine

## 2020-06-13 ENCOUNTER — Ambulatory Visit (INDEPENDENT_AMBULATORY_CARE_PROVIDER_SITE_OTHER): Payer: Medicare Other | Admitting: Emergency Medicine

## 2020-06-13 ENCOUNTER — Encounter: Payer: Self-pay | Admitting: Emergency Medicine

## 2020-06-13 ENCOUNTER — Other Ambulatory Visit: Payer: Self-pay

## 2020-06-13 DIAGNOSIS — R0609 Other forms of dyspnea: Secondary | ICD-10-CM

## 2020-06-13 DIAGNOSIS — R0600 Dyspnea, unspecified: Secondary | ICD-10-CM

## 2020-06-13 LAB — PULMONARY FUNCTION TEST
DL/VA % pred: 108 %
DL/VA: 4.59 ml/min/mmHg/L
DLCO cor % pred: 105 %
DLCO cor: 18.47 ml/min/mmHg
DLCO unc % pred: 105 %
DLCO unc: 18.47 ml/min/mmHg
FEF 25-75 Post: 2.74 L/sec
FEF 25-75 Pre: 2.47 L/sec
FEF2575-%Change-Post: 10 %
FEF2575-%Pred-Post: 162 %
FEF2575-%Pred-Pre: 146 %
FEV1-%Change-Post: 1 %
FEV1-%Pred-Post: 102 %
FEV1-%Pred-Pre: 100 %
FEV1-Post: 2.01 L
FEV1-Pre: 1.98 L
FEV1FVC-%Change-Post: 2 %
FEV1FVC-%Pred-Pre: 114 %
FEV6-%Change-Post: 0 %
FEV6-%Pred-Post: 91 %
FEV6-%Pred-Pre: 90 %
FEV6-Post: 2.26 L
FEV6-Pre: 2.26 L
FEV6FVC-%Pred-Post: 104 %
FEV6FVC-%Pred-Pre: 104 %
FVC-%Change-Post: 0 %
FVC-%Pred-Post: 86 %
FVC-%Pred-Pre: 87 %
FVC-Post: 2.26 L
FVC-Pre: 2.28 L
Post FEV1/FVC ratio: 89 %
Post FEV6/FVC ratio: 100 %
Pre FEV1/FVC ratio: 87 %
Pre FEV6/FVC Ratio: 100 %
RV % pred: 174 %
RV: 3.59 L
TLC % pred: 138 %
TLC: 6.37 L

## 2020-06-13 MED ORDER — ALBUTEROL SULFATE HFA 108 (90 BASE) MCG/ACT IN AERS
2.0000 | INHALATION_SPRAY | Freq: Four times a day (QID) | RESPIRATORY_TRACT | 6 refills | Status: DC | PRN
Start: 2020-06-13 — End: 2022-10-11

## 2020-06-13 NOTE — Progress Notes (Signed)
Full PFT performed today. °

## 2020-06-13 NOTE — Patient Instructions (Signed)
Your pulmonary function testing and chest x-ray are both reassuring.  There is some evidence for hyperinflation on your pulmonary function testing that suggest that you might get some benefit from an inhaler. We will try albuterol 2 puffs up to every 4 hours if needed for shortness of breath, chest tightness, wheezing.  You can use this medication on as-needed basis to see if it helps you. Follow with Dr Lamonte Sakai if needed for any changes in you breathing.

## 2020-06-13 NOTE — Assessment & Plan Note (Signed)
Multifactorial with largest component appearing to be deconditioning.  Her exercise routine is exceedingly curtailed by her back and leg pain.  Echocardiogram and cardiac work-up were both reassuring.  She did have hyperinflation on pulmonary function testing despite normal airflows.  She may benefit from bronchodilator therapy we will do a trial of albuterol.  She was instructed on its use and to use if needed  Your pulmonary function testing and chest x-ray are both reassuring.  There is some evidence for hyperinflation on your pulmonary function testing that suggest that you might get some benefit from an inhaler. We will try albuterol 2 puffs up to every 4 hours if needed for shortness of breath, chest tightness, wheezing.  You can use this medication on as-needed basis to see if it helps you. Follow with Dr Lamonte Sakai if needed for any changes in you breathing.

## 2020-06-13 NOTE — Addendum Note (Signed)
Addended by: Gavin Potters R on: 06/13/2020 04:56 PM   Modules accepted: Orders

## 2020-06-13 NOTE — Progress Notes (Signed)
Subjective:    Patient ID: Elizabeth Stone, female    DOB: 02-07-1946, 74 y.o.   MRN: [id number]  HPI 74 year old former smoker (15 pack years) with diabetes, hyperlipidemia, bipolar, IBS, fibromyalgia, Mnire's disease with poor hearing, GERD, spinal stenosis and chronic pain with opiate use.  She is here today to evaluate dyspnea.  She has had some shortness of breath for over 1 year with activity.  Underwent cardiac evaluation which was reassuring, with a low risk pharmacologic stress test.  A CT-PA was done 08/12/2018 to evaluate dyspnea, I have reviewed, shows no evidence of pulmonary embolism, small hiatal hernia, some curvature of the spine, no pulmonary infiltrates.  She had a stable 4 mm pulmonary nodule in the right major fissure.  Today she reports that she has SOB with minimal exertion, even showering. She can do some light house work, cooking. Dyspnea is worse with bending, has trouble when singing at church or when conversing. She denies any CP. She has some cough, often happens in the evening. May have improved some when she started Nexium. She can hear some wheeze even at rest. She has gained about 30 lbs over the last 2 yrs, now has very limited exercise.   Her daughter was recently diagnosed with [place]  ROV 06/13/20 --this is a follow-up visit for evaluation of slow progressive multifactorial dyspnea.  74 year old woman with diabetes, hyperlipidemia, bipolar disorder, IBS, fibromyalgia, Mnire's disease, GERD, spinal stenosis, chronic pain with opiate use.  She has a small hiatal hernia and some mild kyphosis that could contribute to restriction.  Suspect multifactorial disease.  She had a reassuring echocardiogram and cardiac stress test.  Chest x-ray 05/01/2020 reviewed by me shows no infiltrates effusions or other pulmonary abnormality.  She underwent pulmonary function testing today which I have reviewed, show normal airflows without a bronchodilator response, hyperinflated volumes and  a normal diffusion capacity.  There is a sawtooth pattern in her expiratory limb of her flow volume loop of unclear significance. She notes that her breathing is largely unchanged. Her exercise tolerance is low.    CBC    Component Value Date/Time   WBC 6.4 11/10/2019 1349   RBC 4.62 11/10/2019 1349   HGB 15.5 11/10/2019 1349   HCT 43.9 11/10/2019 1349   PLT 177 11/10/2019 1349   MCV 95.0 11/10/2019 1349   MCH 33.5 (H) 11/10/2019 1349   MCHC 35.3 11/10/2019 1349   RDW 11.8 11/10/2019 1349   LYMPHSABS 1,562 11/10/2019 1349   MONOABS 0.7 11/21/2018 1539   EOSABS 90 11/10/2019 1349   BASOSABS 70 11/10/2019 1349      Review of Systems As per HPI      Objective:   Physical Exam Vitals:   06/13/20 1335  BP: (!) 146/72  Pulse: 96  Temp: (!) 97.2 F (36.2 C)  TempSrc: Temporal  SpO2: 96%  Weight: 166 lb (75.3 kg)  Height: 5\' 1"  (1.549 m)   Gen: Pleasant, obese elderly woman, in no distress,  normal affect  ENT: No lesions,  mouth clear,  oropharynx clear, no postnasal drip  Neck: No JVD, no stridor  Lungs: No use of accessory muscles, no crackles or wheezing on normal respiration, clear bilaterally  Cardiovascular: RRR, heart sounds normal, no murmur or gallops, no peripheral edema  Musculoskeletal: No deformities, no cyanosis or clubbing  Neuro: alert, awake, non focal, poor hearing  Skin: Warm, no lesions or rash     Assessment & Plan:  Dyspnea on exertion Multifactorial with largest  component appearing to be deconditioning.  Her exercise routine is exceedingly curtailed by her back and leg pain.  Echocardiogram and cardiac work-up were both reassuring.  She did have hyperinflation on pulmonary function testing despite normal airflows.  She may benefit from bronchodilator therapy we will do a trial of albuterol.  She was instructed on its use and to use if needed  Your pulmonary function testing and chest x-ray are both reassuring.  There is some evidence for  hyperinflation on your pulmonary function testing that suggest that you might get some benefit from an inhaler. We will try albuterol 2 puffs up to every 4 hours if needed for shortness of breath, chest tightness, wheezing.  You can use this medication on as-needed basis to see if it helps you. Follow with Dr Lamonte Sakai if needed for any changes in you breathing.   Baltazar Apo, MD, PhD 06/13/2020, 1:48 PM [facility] [phone] or if no answer [phone]

## 2020-06-20 DIAGNOSIS — E782 Mixed hyperlipidemia: Secondary | ICD-10-CM | POA: Diagnosis not present

## 2020-06-20 DIAGNOSIS — H8109 Meniere's disease, unspecified ear: Secondary | ICD-10-CM | POA: Diagnosis not present

## 2020-06-20 DIAGNOSIS — H40013 Open angle with borderline findings, low risk, bilateral: Secondary | ICD-10-CM | POA: Diagnosis not present

## 2020-06-20 DIAGNOSIS — M545 Low back pain: Secondary | ICD-10-CM | POA: Diagnosis not present

## 2020-07-11 ENCOUNTER — Telehealth: Payer: Self-pay | Admitting: *Deleted

## 2020-07-11 ENCOUNTER — Encounter: Payer: Self-pay | Admitting: Neurology

## 2020-07-11 ENCOUNTER — Ambulatory Visit: Payer: Medicare Other | Admitting: Neurology

## 2020-07-11 NOTE — Telephone Encounter (Signed)
No showed new patient appointment. 

## 2020-07-15 DIAGNOSIS — Z23 Encounter for immunization: Secondary | ICD-10-CM | POA: Diagnosis not present

## 2020-07-18 DIAGNOSIS — M810 Age-related osteoporosis without current pathological fracture: Secondary | ICD-10-CM | POA: Diagnosis not present

## 2020-07-18 DIAGNOSIS — G4700 Insomnia, unspecified: Secondary | ICD-10-CM | POA: Diagnosis not present

## 2020-07-18 DIAGNOSIS — M1990 Unspecified osteoarthritis, unspecified site: Secondary | ICD-10-CM | POA: Diagnosis not present

## 2020-07-18 DIAGNOSIS — F3181 Bipolar II disorder: Secondary | ICD-10-CM | POA: Diagnosis not present

## 2020-07-18 DIAGNOSIS — E782 Mixed hyperlipidemia: Secondary | ICD-10-CM | POA: Diagnosis not present

## 2020-07-19 ENCOUNTER — Ambulatory Visit: Payer: Medicare Other | Admitting: Neurology

## 2020-07-26 DIAGNOSIS — E119 Type 2 diabetes mellitus without complications: Secondary | ICD-10-CM | POA: Diagnosis not present

## 2020-07-31 DIAGNOSIS — E119 Type 2 diabetes mellitus without complications: Secondary | ICD-10-CM | POA: Diagnosis not present

## 2020-07-31 DIAGNOSIS — M1990 Unspecified osteoarthritis, unspecified site: Secondary | ICD-10-CM | POA: Diagnosis not present

## 2020-07-31 DIAGNOSIS — Z6831 Body mass index (BMI) 31.0-31.9, adult: Secondary | ICD-10-CM | POA: Diagnosis not present

## 2020-08-16 DIAGNOSIS — F319 Bipolar disorder, unspecified: Secondary | ICD-10-CM | POA: Diagnosis not present

## 2020-08-16 DIAGNOSIS — E782 Mixed hyperlipidemia: Secondary | ICD-10-CM | POA: Diagnosis not present

## 2020-08-16 DIAGNOSIS — G4700 Insomnia, unspecified: Secondary | ICD-10-CM | POA: Diagnosis not present

## 2020-08-16 DIAGNOSIS — M810 Age-related osteoporosis without current pathological fracture: Secondary | ICD-10-CM | POA: Diagnosis not present

## 2020-08-21 DIAGNOSIS — C44719 Basal cell carcinoma of skin of left lower limb, including hip: Secondary | ICD-10-CM | POA: Diagnosis not present

## 2020-08-21 DIAGNOSIS — L578 Other skin changes due to chronic exposure to nonionizing radiation: Secondary | ICD-10-CM | POA: Diagnosis not present

## 2020-08-21 DIAGNOSIS — L658 Other specified nonscarring hair loss: Secondary | ICD-10-CM | POA: Diagnosis not present

## 2020-08-21 DIAGNOSIS — L821 Other seborrheic keratosis: Secondary | ICD-10-CM | POA: Diagnosis not present

## 2020-08-21 DIAGNOSIS — L719 Rosacea, unspecified: Secondary | ICD-10-CM | POA: Diagnosis not present

## 2020-09-03 DIAGNOSIS — E782 Mixed hyperlipidemia: Secondary | ICD-10-CM | POA: Diagnosis not present

## 2020-09-03 DIAGNOSIS — Z794 Long term (current) use of insulin: Secondary | ICD-10-CM | POA: Diagnosis not present

## 2020-09-03 DIAGNOSIS — E1169 Type 2 diabetes mellitus with other specified complication: Secondary | ICD-10-CM | POA: Diagnosis not present

## 2020-09-08 DIAGNOSIS — E782 Mixed hyperlipidemia: Secondary | ICD-10-CM | POA: Diagnosis not present

## 2020-09-08 DIAGNOSIS — K219 Gastro-esophageal reflux disease without esophagitis: Secondary | ICD-10-CM | POA: Diagnosis not present

## 2020-09-08 DIAGNOSIS — G4700 Insomnia, unspecified: Secondary | ICD-10-CM | POA: Diagnosis not present

## 2020-09-08 DIAGNOSIS — M810 Age-related osteoporosis without current pathological fracture: Secondary | ICD-10-CM | POA: Diagnosis not present

## 2020-09-18 ENCOUNTER — Other Ambulatory Visit (HOSPITAL_COMMUNITY): Payer: Self-pay | Admitting: Internal Medicine

## 2020-09-18 ENCOUNTER — Ambulatory Visit: Payer: Medicare Other | Attending: Internal Medicine

## 2020-09-18 DIAGNOSIS — Z23 Encounter for immunization: Secondary | ICD-10-CM

## 2020-09-18 NOTE — Progress Notes (Signed)
   Covid-19 Vaccination Clinic  Name:  LUDELL ZACARIAS    MRN: [id number] DOB: [birth year]  09/18/2020  Ms. Rodenberg was observed post Covid-19 immunization for 15 minutes without incident. She was provided with Vaccine Information Sheet and instruction to access the V-Safe system.   Ms. Vasey was instructed to call 911 with any severe reactions post vaccine: Marland Kitchen Difficulty breathing  . Swelling of face and throat  . A fast heartbeat  . A bad rash all over body  . Dizziness and weakness   Immunizations Administered    Name Date Dose VIS Date Route   Pfizer COVID-19 Vaccine 09/18/2020 10:37 AM 0.3 mL 08/07/2020 Intramuscular   Manufacturer: [place]   Lot: [id number]   [place]: [address]-0

## 2020-09-19 DIAGNOSIS — E1169 Type 2 diabetes mellitus with other specified complication: Secondary | ICD-10-CM | POA: Diagnosis not present

## 2020-09-19 DIAGNOSIS — E782 Mixed hyperlipidemia: Secondary | ICD-10-CM | POA: Diagnosis not present

## 2020-09-19 DIAGNOSIS — G894 Chronic pain syndrome: Secondary | ICD-10-CM | POA: Diagnosis not present

## 2020-09-19 DIAGNOSIS — M1990 Unspecified osteoarthritis, unspecified site: Secondary | ICD-10-CM | POA: Diagnosis not present

## 2020-09-30 DIAGNOSIS — W57XXXA Bitten or stung by nonvenomous insect and other nonvenomous arthropods, initial encounter: Secondary | ICD-10-CM | POA: Diagnosis not present

## 2020-09-30 DIAGNOSIS — S60460A Insect bite (nonvenomous) of right index finger, initial encounter: Secondary | ICD-10-CM | POA: Diagnosis not present

## 2020-10-07 ENCOUNTER — Ambulatory Visit: Payer: Medicare Other | Admitting: Neurology

## 2020-10-07 ENCOUNTER — Telehealth: Payer: Self-pay | Admitting: Neurology

## 2020-10-07 ENCOUNTER — Encounter: Payer: Self-pay | Admitting: Neurology

## 2020-10-07 VITALS — BP 142/83 | HR 92

## 2020-10-07 DIAGNOSIS — R269 Unspecified abnormalities of gait and mobility: Secondary | ICD-10-CM | POA: Diagnosis not present

## 2020-10-07 DIAGNOSIS — M5442 Lumbago with sciatica, left side: Secondary | ICD-10-CM | POA: Diagnosis not present

## 2020-10-07 DIAGNOSIS — M5441 Lumbago with sciatica, right side: Secondary | ICD-10-CM

## 2020-10-07 NOTE — Telephone Encounter (Signed)
[employer] medicare order sent to [facility]. No auth they will reach out to the patient to schedule.  

## 2020-10-07 NOTE — Progress Notes (Signed)
Chief Complaint  Patient presents with   New Patient (Initial Visit)    Reports progressive balance issues for many years. States her legs will just give out. If she goes out for trips, she tries to use a cane to prevent falls. Says she had a normal neurological work-up in mid-2000.     HISTORICAL  Elizabeth Stone is a 74 year old female, seen in request by her primary care physician Dr. Olen Pel, [facility] for evaluation of balance issues, initial evaluation was on 10/07/2020.  I reviewed and summarized the referring note. 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  She began to notice gradual onset gait abnormality since 2018, also concurrent with her worsening low back pain, radiating pain to bilateral lower extremity  Now she complains of balance difficulty, can only walk short distance without assistant from parking lot to the front desk, with prolonged walking, she complains of low back pain, bilateral lower extremity heavy sensation, " as if my back is carrying 500 pounds", she denies persistent bilateral lower extremity paresthesia, denies bowel bladder incontinence, her low back pain is about 8 out of 10, constant, improved by epidural injection periodically  I personally reviewed CT myelogram of cervical spine in October 2020: Multilevel degenerative spondylosis, and facet arthropathy throughout the cervical spine and upper thoracic region, no compressive canal stenosis, variable  degree of foraminal narrowing  CT myelogram of lumbar spine in June 2019, multilevel degenerative changes throughout the lumbar spine, severe right and moderate left neural foraminal stenosis L5-S1, 3 mm anterolisthesis at L5-S1,   REVIEW OF SYSTEMS: Full 14 system review of systems performed and notable only for as above All other review of systems were negative.  ALLERGIES: Allergies  Allergen Reactions   Adhesive [Tape] Other (See Comments)    PT STATED ALLERGY TO TAPE, NOT LATEX GLOVES AS PREVIOUSLY DOCUMENTED (Pt prefers paper tape.)    HOME MEDICATIONS: Current Outpatient Medications  Medication Sig Dispense Refill   acetaminophen (TYLENOL) 500 MG tablet Take 1,000 mg by mouth every 6 (six) hours as needed for mild pain (for pain.).      albuterol (VENTOLIN HFA) 108 (90 Base) MCG/ACT inhaler Inhale 2 puffs into the lungs every 6 (six) hours as needed for wheezing or shortness of breath. 8 g 6   Ascorbic Acid (VITAMIN C PO) Take 500 mg by mouth daily.     Asenapine Maleate 10 MG SUBL Place 10 mg under the tongue at bedtime.     aspirin 81 MG chewable tablet Chew 81 mg by mouth daily.     atorvastatin (LIPITOR) 10 MG tablet Take 10 mg by mouth at bedtime.      buPROPion (WELLBUTRIN XL) 300 MG 24 hr tablet Take 300 mg by mouth daily.     clonazePAM (KLONOPIN) 0.5 MG tablet Take 1 mg by mouth at bedtime.     diclofenac (VOLTAREN) 75 MG EC tablet Take 75 mg by mouth daily.      DULoxetine (CYMBALTA) 60 MG capsule Take 60 mg by  mouth 2 (two) times daily.      hydrochlorothiazide (HYDRODIURIL) 25 MG tablet Take 25 mg by mouth Daily.      HYDROcodone-acetaminophen (NORCO) 10-325 MG tablet Take 1 tablet by mouth every 12 (twelve) hours as needed.     KLOR-CON M20 20 MEQ tablet Take 20 mEq by mouth daily.      lamoTRIgine (LAMICTAL) 150 MG tablet Take 300 mg by mouth daily.     meloxicam (MOBIC) 15 MG tablet Take 15 mg by mouth daily.     metFORMIN (GLUCOPHAGE-XR) 750 MG  24 hr tablet Take 750 mg by mouth daily with breakfast.     montelukast (SINGULAIR) 10 MG tablet Take 10 mg by mouth at bedtime.     VITAMIN D PO Take 1,000 Units by mouth daily.     No current facility-administered medications for this visit.    PAST MEDICAL HISTORY: Past Medical History:  Diagnosis Date   Acute blood loss anemia    hx   Anxiety    Arthritis    Balance problem    Bipolar disorder ([place])    Cochlear implant in place    Cochlear implant in place    right ear   Depression    Dermatitis    on face   Dyspnea    Fibromyalgia    GERD (gastroesophageal reflux disease)    Hard of hearing    Hearing aid worn    left ear   Hearing impaired    has cochlear implant   HTN (hypertension)    Hypokalemia    Meniere disease    takes HCTZ for her inner ear problem   Mixed hyperlipidemia 09/27/2013   Osteoporosis    Pneumonia 11/2016   Pre-diabetes    Primary localized osteoarthritis of left knee 11/16/2018   Primary localized osteoarthritis of right knee    Slow transit constipation    Unsteady gait     PAST SURGICAL HISTORY: Past Surgical History:  Procedure Laterality Date   ABDOMINAL HYSTERECTOMY  1986   bladder tack  2007   COCHLEAR IMPLANT     2007   COLONOSCOPY     COLONOSCOPY     HAMMER TOE SURGERY  2014   left foot   REVERSE SHOULDER ARTHROPLASTY Right 12/13/2014   dr Tamera Punt   REVERSE SHOULDER ARTHROPLASTY Right 12/13/2014   Procedure: REVERSE SHOULDER ARTHROPLASTY rt;  Surgeon: Nita Sells, MD;  Location: [facility];  Service: Orthopedics;  Laterality: Right;  Right reverse total shoulder replacement   REVERSE SHOULDER ARTHROPLASTY Left 01/07/2017   Procedure: REVERSE LEFT TOTAL SHOULDER ARTHROPLASTY;  Surgeon: Tania Ade, MD;  Location: [facility];  Service: Orthopedics;  Laterality: Left;  Left reveres total shoulder arthroplasty   TOTAL KNEE ARTHROPLASTY Right 12/16/2015   Procedure: TOTAL KNEE  ARTHROPLASTY;  Surgeon: Elsie Saas, MD;  Location: [place];  Service: Orthopedics;  Laterality: Right;   TOTAL KNEE ARTHROPLASTY Left 11/28/2018   Procedure: LEFT TOTAL KNEE ARTHROPLASTY;  Surgeon: Elsie Saas, MD;  Location: [facility];  Service: Orthopedics;  Laterality: Left;    FAMILY HISTORY: Family History  Problem Relation Age of Onset   Heart disease Mother    Breast cancer Mother    Cervical cancer Mother    Diabetes Mother    Heart disease Father    Diabetes Maternal Aunt    Colon cancer Neg Hx    Esophageal cancer Neg Hx    Rectal cancer Neg Hx    Stomach cancer Neg Hx  Liver cancer Neg Hx    Pancreatic cancer Neg Hx     SOCIAL HISTORY: Social History   Socioeconomic History   Marital status: Widowed    Spouse name: Not on file   Number of children: 2   Years of education: college   Highest education level: Not on file  Occupational History   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   Right-handed.   Rare caffeine use.   Social Determinants of Health   Financial Resource Strain: Not on file  Food Insecurity: Not on file  Transportation Needs: Not on file  Physical Activity: Not on file  Stress: Not on file  Social Connections: Not on file  Intimate Partner Violence: Not on file     PHYSICAL EXAM   Vitals:   10/07/20 1344  BP: (!) 142/83  Pulse: 92   Not recorded     There is no height or weight on file to calculate BMI.  PHYSICAL EXAMNIATION:  Gen: NAD, conversant, well nourised, well  groomed                     Cardiovascular: Regular rate rhythm, no peripheral edema, warm, nontender. Eyes: Conjunctivae clear without exudates or hemorrhage Neck: Supple, no carotid bruits. Pulmonary: Clear to auscultation bilaterally   NEUROLOGICAL EXAM:  MENTAL STATUS: Speech: Mildly slurred speech due to hearing loss, fluent and spontaneous with normal comprehension.  Cognition:     Orientation to time, place and person     Normal recent and remote memory     Normal Attention span and concentration     Normal Language, naming, repeating,spontaneous speech     Fund of knowledge   CRANIAL NERVES: CN II: Visual fields are full to confrontation. Pupils are round equal and briskly reactive to light. CN III, IV, VI: extraocular movement are normal. No ptosis. CN V: Facial sensation is intact to light touch CN VII: Face is symmetric with normal eye closure  CN VIII: Hard of hearing, CN IX, X: Phonation is normal. CN XI: Head turning and shoulder shrug are intact  MOTOR: There is no pronator drift of out-stretched arms. Muscle bulk and tone are normal. Muscle strength is normal.  REFLEXES: Reflexes are 2+ and symmetric at the biceps, triceps, knees, and ankles. Plantar responses are flexor.  SENSORY: Intact to light touch, pinprick and vibratory sensation are intact in fingers and toes.  COORDINATION: Mild to moderate trunk ataxia, no limb ataxia noted  GAIT/STANCE: She needs push-up to get up from seated position, wide-based, unsteady, cautious,  DIAGNOSTIC DATA (LABS, IMAGING, TESTING) - I reviewed patient records, labs, notes, testing and imaging myself where available.   ASSESSMENT AND PLAN  ZORIAH PULICE is a 74 y.o. female   Progressive gait abnormality since 2018 History of severe Mnire's disease, with severe bilateral sensorineural hearing loss, status post bilateral cochlear implant 2007 Multilevel cervical, lumbar spinal degenerative changes,  chronic neck,  low back pain  On examination, she has mild to moderate truncal ataxia, hyperreflexia on examination  Her gait abnormality likely multifactorial, including bilateral vestibular malfunction due to long history of Mnire's disease, likely a component of cervical myelopathy, lumbosacral radiculopathy  Proceed with EMG nerve conduction study  CT of the brain  Refer to physical therapy   Marcial Pacas, M.D. Ph.D.  [facility] [address], [place], [place] [address] Ph: [phone] Fax: [phone]  CC:  Orpah Melter, [address] [place],   [id number]

## 2020-10-08 ENCOUNTER — Encounter: Payer: Self-pay | Admitting: Neurology

## 2020-10-10 DIAGNOSIS — E782 Mixed hyperlipidemia: Secondary | ICD-10-CM | POA: Diagnosis not present

## 2020-10-10 DIAGNOSIS — E1169 Type 2 diabetes mellitus with other specified complication: Secondary | ICD-10-CM | POA: Diagnosis not present

## 2020-10-10 DIAGNOSIS — M810 Age-related osteoporosis without current pathological fracture: Secondary | ICD-10-CM | POA: Diagnosis not present

## 2020-10-10 DIAGNOSIS — H35033 Hypertensive retinopathy, bilateral: Secondary | ICD-10-CM | POA: Diagnosis not present

## 2020-10-29 ENCOUNTER — Ambulatory Visit
Admission: RE | Admit: 2020-10-29 | Discharge: 2020-10-29 | Disposition: A | Discharge status: Home / Self Care | Payer: Medicare Other | Source: Ambulatory Visit | Attending: Neurology | Admitting: Neurology

## 2020-10-29 DIAGNOSIS — R269 Unspecified abnormalities of gait and mobility: Secondary | ICD-10-CM

## 2020-10-29 DIAGNOSIS — M5442 Lumbago with sciatica, left side: Secondary | ICD-10-CM

## 2020-10-29 DIAGNOSIS — M5441 Lumbago with sciatica, right side: Secondary | ICD-10-CM

## 2020-11-11 ENCOUNTER — Encounter: Payer: Medicare Other | Admitting: Neurology

## 2020-11-13 DIAGNOSIS — H25011 Cortical age-related cataract, right eye: Secondary | ICD-10-CM | POA: Diagnosis not present

## 2020-11-13 DIAGNOSIS — E113291 Type 2 diabetes mellitus with mild nonproliferative diabetic retinopathy without macular edema, right eye: Secondary | ICD-10-CM | POA: Diagnosis not present

## 2020-11-13 DIAGNOSIS — H25041 Posterior subcapsular polar age-related cataract, right eye: Secondary | ICD-10-CM | POA: Diagnosis not present

## 2020-11-13 DIAGNOSIS — H2511 Age-related nuclear cataract, right eye: Secondary | ICD-10-CM | POA: Diagnosis not present

## 2020-11-14 DIAGNOSIS — M810 Age-related osteoporosis without current pathological fracture: Secondary | ICD-10-CM | POA: Diagnosis not present

## 2020-11-14 DIAGNOSIS — M47816 Spondylosis without myelopathy or radiculopathy, lumbar region: Secondary | ICD-10-CM | POA: Diagnosis not present

## 2020-11-14 DIAGNOSIS — F319 Bipolar disorder, unspecified: Secondary | ICD-10-CM | POA: Diagnosis not present

## 2020-11-14 DIAGNOSIS — E782 Mixed hyperlipidemia: Secondary | ICD-10-CM | POA: Diagnosis not present

## 2020-11-14 DIAGNOSIS — M48061 Spinal stenosis, lumbar region without neurogenic claudication: Secondary | ICD-10-CM | POA: Diagnosis not present

## 2020-11-14 DIAGNOSIS — E1169 Type 2 diabetes mellitus with other specified complication: Secondary | ICD-10-CM | POA: Diagnosis not present

## 2020-11-25 ENCOUNTER — Ambulatory Visit (INDEPENDENT_AMBULATORY_CARE_PROVIDER_SITE_OTHER): Payer: Medicare Other | Admitting: Neurology

## 2020-11-25 ENCOUNTER — Ambulatory Visit: Payer: Medicare Other | Admitting: Neurology

## 2020-11-25 DIAGNOSIS — R269 Unspecified abnormalities of gait and mobility: Secondary | ICD-10-CM | POA: Diagnosis not present

## 2020-11-25 DIAGNOSIS — M5442 Lumbago with sciatica, left side: Secondary | ICD-10-CM

## 2020-11-25 DIAGNOSIS — Z9621 Cochlear implant status: Secondary | ICD-10-CM | POA: Diagnosis not present

## 2020-11-25 DIAGNOSIS — M5441 Lumbago with sciatica, right side: Secondary | ICD-10-CM | POA: Diagnosis not present

## 2020-11-25 NOTE — Progress Notes (Signed)
No chief complaint on file.   HISTORICAL  Elizabeth Stone is a 75 year old female, seen in request by her primary care physician Dr. Olen Stone, Elizabeth Stone for evaluation of balance issues, initial evaluation was on 10/07/2020.  I reviewed and summarized the referring note. 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  She began to notice gradual onset gait abnormality since 2018, also concurrent with her worsening low back pain, radiating pain to bilateral lower extremity  Now she complains of balance difficulty, can only walk short distance without assistant from parking lot to the front desk, with prolonged walking, she complains of low back pain, bilateral lower extremity heavy sensation, " as if my back is carrying 500 pounds", she denies persistent bilateral lower extremity paresthesia, denies bowel bladder incontinence, her low back pain is about 8 out of 10, constant, improved by epidural injection periodically  I personally reviewed CT myelogram of cervical spine in October 2020: Multilevel degenerative spondylosis, and facet arthropathy throughout the cervical spine and upper thoracic region, no compressive canal stenosis, variable degree of foraminal narrowing  CT myelogram of lumbar spine in June 2019, multilevel degenerative changes throughout the lumbar spine, severe right and moderate left neural foraminal stenosis L5-S1, 3 mm anterolisthesis at L5-S1,  Update November 25, 2020, Elizabeth Stone return for electrodiagnostic study today, which was essentially normal, there was no evidence of peripheral neuropathy, myopathy  We personally reviewed CT head without contrast in January 2022, artifact from cochlear implant, mild generalized atrophy, supratentorium small vessel disease, no acute abnormality   REVIEW OF SYSTEMS: Full 14 system review of systems performed and notable only for as above All other review of systems were negative.  ALLERGIES: Allergies  Allergen Reactions  . Adhesive [Tape] Other (See Comments)    PT STATED ALLERGY TO TAPE, NOT LATEX GLOVES AS PREVIOUSLY DOCUMENTED (Pt prefers paper tape.)    HOME MEDICATIONS: Current Outpatient Medications  Medication Sig Dispense Refill  . acetaminophen (TYLENOL) 500 MG tablet Take 1,000 mg by mouth every 6 (six) hours as needed for mild pain (for pain.).     Marland Kitchen albuterol (VENTOLIN HFA) 108 (90 Base) MCG/ACT inhaler Inhale 2 puffs into the lungs every 6 (six) hours as needed for wheezing or shortness of breath. 8 g 6  . Ascorbic Acid (VITAMIN C PO) Take 500 mg by mouth daily.    . Asenapine Maleate 10 MG SUBL Place 10 mg under the tongue at bedtime.    Marland Kitchen aspirin 81 MG chewable tablet Chew 81 mg by mouth daily.    Marland Kitchen atorvastatin (LIPITOR) 10 MG tablet Take 10 mg by mouth at bedtime.     Marland Kitchen buPROPion (WELLBUTRIN XL) 300 MG 24 hr tablet Take 300 mg by mouth daily.    . clonazePAM (KLONOPIN) 0.5 MG tablet Take 1 mg by mouth at bedtime.    . diclofenac (VOLTAREN) 75 MG EC tablet Take 75 mg by mouth daily.     . DULoxetine (CYMBALTA) 60 MG capsule Take 60 mg by mouth 2 (  two) times daily.     . hydrochlorothiazide (HYDRODIURIL) 25 MG tablet Take 25 mg by mouth Daily.     Marland Kitchen HYDROcodone-acetaminophen (NORCO) 10-325 MG tablet Take 1 tablet by mouth every 12 (twelve) hours as needed.    Marland Kitchen KLOR-CON M20 20 MEQ tablet Take 20 mEq by mouth daily.     Marland Kitchen lamoTRIgine (LAMICTAL) 150 MG tablet Take 300 mg by mouth daily.    .  meloxicam (MOBIC) 15 MG tablet Take 15 mg by mouth daily.    . metFORMIN (GLUCOPHAGE-XR) 750 MG 24 hr tablet Take 750 mg by mouth daily with breakfast.    . montelukast (SINGULAIR) 10 MG tablet Take 10 mg by mouth at bedtime.    Marland Kitchen VITAMIN D PO Take 1,000 Units by mouth daily.     No current facility-administered medications for this visit.    PAST MEDICAL HISTORY: Past Medical History:  Diagnosis Date  . Acute blood loss anemia    hx  . Anxiety   . Arthritis   . Balance problem   . Bipolar disorder ([place])   . Cochlear implant in place   . Cochlear implant in place    right ear  . Depression   . Dermatitis    on face  . Dyspnea   . Fibromyalgia   . GERD (gastroesophageal reflux disease)   . Hard of hearing   . Hearing aid worn    left ear  . Hearing impaired    has cochlear implant  . HTN (hypertension)   . Hypokalemia   . Meniere disease    takes HCTZ for her inner ear problem  . Mixed hyperlipidemia 09/27/2013  . Osteoporosis   . Pneumonia 11/2016  . Pre-diabetes   . Primary localized osteoarthritis of left knee 11/16/2018  . Primary localized osteoarthritis of right knee   . Slow transit constipation   . Unsteady gait     PAST SURGICAL HISTORY: Past Surgical History:  Procedure Laterality Date  . ABDOMINAL HYSTERECTOMY  1986  . bladder tack  2007  . COCHLEAR IMPLANT     2007  . COLONOSCOPY    . COLONOSCOPY    . HAMMER TOE SURGERY  2014   left foot  . REVERSE SHOULDER ARTHROPLASTY Right 12/13/2014   dr Tamera Stone  . REVERSE SHOULDER ARTHROPLASTY Right 12/13/2014   Procedure: REVERSE SHOULDER ARTHROPLASTY rt;  Surgeon: Elizabeth Sells, MD;  Location: [place];  Service: Orthopedics;  Laterality: Right;  Right reverse total shoulder replacement  . REVERSE SHOULDER ARTHROPLASTY Left 01/07/2017   Procedure: REVERSE LEFT TOTAL SHOULDER ARTHROPLASTY;  Surgeon: Elizabeth Ade, MD;  Location: [place];  Service: Orthopedics;  Laterality: Left;  Left reveres total  shoulder arthroplasty  . TOTAL KNEE ARTHROPLASTY Right 12/16/2015   Procedure: TOTAL KNEE ARTHROPLASTY;  Surgeon: Elsie Saas, MD;  Location: [place];  Service: Orthopedics;  Laterality: Right;  . TOTAL KNEE ARTHROPLASTY Left 11/28/2018   Procedure: LEFT TOTAL KNEE ARTHROPLASTY;  Surgeon: Elsie Saas, MD;  Location: [place];  Service: Orthopedics;  Laterality: Left;    FAMILY HISTORY: Family History  Problem Relation Age of Onset  . Heart disease Mother   . Breast cancer Mother   . Cervical cancer Mother   . Diabetes Mother   . Heart disease Father   . Diabetes Maternal Aunt   . Colon cancer Neg Hx   . Esophageal cancer Neg Hx   . Rectal cancer Neg Hx   . Stomach cancer Neg Hx   .  Liver cancer Neg Hx   . Pancreatic cancer Neg Hx     SOCIAL HISTORY: Social History   Socioeconomic History  . Marital status: Widowed    Spouse name: Not on file  . Number of children: 2  . Years of education: college  . Highest education level: Not on file  Occupational History  . 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 Smoking status: Former Smoker    Packs/day: 0.25    Years: 23.00    Pack years: 5.75    Quit date: 08/25/1993    Years since quitting: 27.2  . Smokeless tobacco: Never Used  Vaping Use  . Vaping Use: Never used  Substance and Sexual Activity  . Alcohol use: Yes    Comment: 2 glasses of wine per night  . Drug use: No  . Sexual activity: Not on file  Other Topics Concern  . Not on file  Social History Narrative   Lives alone.   Right-handed.   Rare caffeine use.   Social Determinants of Health   Financial Resource Strain: Not on file  Food Insecurity: Not on file  Transportation Needs: Not on file  Physical Activity: Not on file  Stress: Not on file  Social Connections: Not on file  Intimate Partner Violence: Not on file     PHYSICAL EXAM   There were no vitals filed for this visit. Not recorded     There is no height or weight on file to calculate  BMI.  PHYSICAL EXAMNIATION:  Gen: NAD, conversant, well nourised, well groomed                  NEUROLOGICAL EXAM:  MENTAL STATUS: Speech/cognition: Mildly slurred speech due to hearing loss, fluent and spontaneous with normal comprehension.   CRANIAL NERVES: CN II: Visual fields are full to confrontation. Pupils are round equal and briskly reactive to light. CN III, IV, VI: extraocular movement are normal. No ptosis. CN V: Facial sensation is intact to light touch CN VII: Face is symmetric with normal eye closure  CN VIII: Hard of hearing, CN IX, X: Phonation is normal. CN XI: Head turning and shoulder shrug are intact  MOTOR: There is no pronator drift of out-stretched arms. Muscle bulk and tone are normal. Muscle strength is normal.  REFLEXES: Reflexes are 2+ and symmetric at the biceps, triceps, knees, and ankles. Plantar responses are flexor.  SENSORY: Intact to light touch, pinprick and vibratory sensation are intact in fingers and toes.  COORDINATION: Mild to moderate trunk ataxia, no limb ataxia noted  GAIT/STANCE: She needs push-up to get up from seated position, wide-based, unsteady, cautious,  DIAGNOSTIC DATA (LABS, IMAGING, TESTING) - I reviewed patient records, labs, notes, testing and imaging myself where available.   ASSESSMENT AND PLAN  ALLINE ROTAN is a 75 y.o. female   Progressive gait abnormality since 2018 History of severe Mnire's disease, with severe bilateral sensorineural hearing loss, status post bilateral cochlear implant 2007 Multilevel cervical, lumbar spinal degenerative changes, chronic neck, low back pain  On examination, she has mild to moderate truncal ataxia, hyperreflexia on examination  Her gait abnormality likely multifactorial, including bilateral vestibular malfunction due to long history of Mnire's disease, deconditioning,  CT head January 2022 implant artifact, generalized atrophy, mild supratentorium small vessel disease no  acute abnormality  EMG nerve conduction study was normal, no evidence of peripheral neuropathy or myopathy, no evidence of lumbar radiculopathy  She is to continue moderate exercise, better walk aerobic  exercise, she is concerned about the cost associated with physical therapy,  Return to clinic for new issues  Marcial Pacas, M.D. Ph.D.  [facility] [address], [place], [place] [address] Ph: [phone] Fax: [phone]  CC:  Orpah Melter, [place],  [place] [address]

## 2020-11-25 NOTE — Procedures (Signed)
Full Name: Tika Hannis Gender: Female MRN #: [id number] Date of Birth: Dec 06, 1945    Visit Date: 11/25/2020 09:19 Age: 75 Years Examining Physician: Marcial Pacas, MD  Referring Physician: Marcial Pacas, MD History: 75 years old female, presented with gradual onset gait abnormality, history of cochlear implant, Mnire's disease  Summary of the test: Nerve conduction study: Bilateral sural, superficial peroneal sensory responses were normal.  Bilateral peroneal to EDB and tibial motor responses were normal  Electromyography:  Selected needle examination of the right lower extremity muscles and right lumbosacral paraspinal muscles were normal.  Conclusion: This is a normal study.  There is no electrodiagnostic evidence of peripheral neuropathy, or right lumbosacral radiculopathy.    ------------------------------- Physician Name, M.D.  [facility] [address], [place], [place] [address] Tel: [phone] Fax: [phone]  Verbal informed consent was obtained from the patient, patient was informed of potential risk of procedure, including bruising, bleeding, hematoma formation, infection, muscle weakness, muscle pain, numbness, among others.        [place]    Nerve / Sites Muscle Latency Ref. Amplitude Ref. Rel Amp Segments Distance Velocity Ref. Area    ms ms mV mV %  cm m/s m/s mVms  R Peroneal - EDB     Ankle EDB 5.4 ?6.5 4.2 ?2.0 100 Ankle - EDB 9   14.3     Fib head EDB 11.1  3.7  88 Fib head - Ankle 25 44 ?44 13.2     Pop fossa EDB 13.4  3.6  95.6 Pop fossa - Fib head 10 44 ?44 13.0         Pop fossa - Ankle      L Peroneal - EDB     Ankle EDB 4.9 ?6.5 9.0 ?2.0 100 Ankle - EDB 9   28.7     Fib head EDB 10.6  5.6  62 Fib head - Ankle 25 44 ?44 20.2     Pop fossa EDB 12.9  7.5  133 Pop fossa - Fib head 10 44 ?44 26.7         Pop fossa - Ankle      R Tibial - AH     Ankle AH 4.5 ?5.8 4.1 ?4.0 100 Ankle - AH 9   11.6     Pop fossa AH 14.3   2.8  66.9 Pop fossa - Ankle 40 41 ?41 15.1  L Tibial - AH     Ankle AH 4.3 ?5.8 5.8 ?4.0 100 Ankle - AH 9   18.8     Pop fossa AH 12.8  5.8  101 Pop fossa - Ankle 38 45 ?41 17.2             SNC    Nerve / Sites Rec. Site Peak Lat Ref.  Amp Ref. Segments Distance    ms ms V V  cm  R Sural - Ankle (Calf)     Calf Ankle 4.3 ?4.4 15 ?6 Calf - Ankle 14  L Sural - Ankle (Calf)     Calf Ankle 4.0 ?4.4 15 ?6 Calf - Ankle 14  R Superficial peroneal - Ankle     Lat leg Ankle 4.4 ?4.4 6 ?6 Lat leg - Ankle 14  L Superficial peroneal - Ankle     Lat leg Ankle 4.2 ?4.4 8 ?6 Lat leg - Ankle 14             F  Wave  Nerve F Lat Ref.   ms ms  R Tibial - AH 55.9 ?56.0  L Tibial - AH 55.4 ?56.0         EMG Summary Table    Spontaneous MUAP Recruitment  Muscle IA Fib PSW Fasc Other Amp Dur. Poly Pattern  R. Tibialis anterior Normal None None None _______ Normal Normal Normal Normal  R. Tibialis posterior Normal None None None _______ Normal Normal Normal Normal  R. Peroneus longus Normal None None None _______ Normal Normal Normal Normal  R. Gastrocnemius (Medial head) Normal None None None _______ Normal Normal Normal Normal  R. Vastus lateralis Normal None None None _______ Normal Normal Normal Normal  R. Lumbar paraspinals (low) Normal None None None _______ Normal Normal Normal Normal  R. Lumbar paraspinals (mid) Normal None None None _______ Normal Normal Normal Normal

## 2020-11-25 NOTE — Patient Instructions (Addendum)
Reader Address: [address], [place], [place] [address] Phone: [phone]  Hours of Operation Monday to Thursday: 8 am to 8 pm Friday: 9 am to 8 pm Saturday: 9 am to 1 pm Sunday: Closed

## 2020-11-26 DIAGNOSIS — H25811 Combined forms of age-related cataract, right eye: Secondary | ICD-10-CM | POA: Diagnosis not present

## 2020-11-26 DIAGNOSIS — H25041 Posterior subcapsular polar age-related cataract, right eye: Secondary | ICD-10-CM | POA: Diagnosis not present

## 2020-11-26 DIAGNOSIS — H2511 Age-related nuclear cataract, right eye: Secondary | ICD-10-CM | POA: Diagnosis not present

## 2020-11-26 DIAGNOSIS — H25011 Cortical age-related cataract, right eye: Secondary | ICD-10-CM | POA: Diagnosis not present

## 2020-12-11 ENCOUNTER — Other Ambulatory Visit: Payer: Self-pay

## 2020-12-11 ENCOUNTER — Encounter (HOSPITAL_COMMUNITY): Payer: Self-pay

## 2020-12-11 ENCOUNTER — Emergency Department (HOSPITAL_COMMUNITY): Payer: Medicare Other

## 2020-12-11 ENCOUNTER — Emergency Department (HOSPITAL_COMMUNITY)
Admission: EM | Admit: 2020-12-11 | Discharge: 2020-12-11 | Disposition: A | Discharge status: Home / Self Care | Payer: Medicare Other | Attending: Emergency Medicine | Admitting: Emergency Medicine

## 2020-12-11 DIAGNOSIS — Z96653 Presence of artificial knee joint, bilateral: Secondary | ICD-10-CM | POA: Diagnosis not present

## 2020-12-11 DIAGNOSIS — M810 Age-related osteoporosis without current pathological fracture: Secondary | ICD-10-CM | POA: Diagnosis not present

## 2020-12-11 DIAGNOSIS — M25551 Pain in right hip: Secondary | ICD-10-CM | POA: Diagnosis not present

## 2020-12-11 DIAGNOSIS — Z7982 Long term (current) use of aspirin: Secondary | ICD-10-CM | POA: Insufficient documentation

## 2020-12-11 DIAGNOSIS — Z96611 Presence of right artificial shoulder joint: Secondary | ICD-10-CM | POA: Diagnosis not present

## 2020-12-11 DIAGNOSIS — R102 Pelvic and perineal pain: Secondary | ICD-10-CM | POA: Insufficient documentation

## 2020-12-11 DIAGNOSIS — E1169 Type 2 diabetes mellitus with other specified complication: Secondary | ICD-10-CM | POA: Diagnosis not present

## 2020-12-11 DIAGNOSIS — Z79899 Other long term (current) drug therapy: Secondary | ICD-10-CM | POA: Diagnosis not present

## 2020-12-11 DIAGNOSIS — M545 Low back pain, unspecified: Secondary | ICD-10-CM

## 2020-12-11 DIAGNOSIS — Z87891 Personal history of nicotine dependence: Secondary | ICD-10-CM | POA: Diagnosis not present

## 2020-12-11 DIAGNOSIS — R109 Unspecified abdominal pain: Secondary | ICD-10-CM | POA: Diagnosis not present

## 2020-12-11 DIAGNOSIS — R260 Ataxic gait: Secondary | ICD-10-CM | POA: Insufficient documentation

## 2020-12-11 DIAGNOSIS — M25559 Pain in unspecified hip: Secondary | ICD-10-CM

## 2020-12-11 DIAGNOSIS — W19XXXA Unspecified fall, initial encounter: Secondary | ICD-10-CM

## 2020-12-11 DIAGNOSIS — Z7984 Long term (current) use of oral hypoglycemic drugs: Secondary | ICD-10-CM | POA: Diagnosis not present

## 2020-12-11 DIAGNOSIS — R7303 Prediabetes: Secondary | ICD-10-CM | POA: Diagnosis not present

## 2020-12-11 DIAGNOSIS — I10 Essential (primary) hypertension: Secondary | ICD-10-CM | POA: Diagnosis not present

## 2020-12-11 DIAGNOSIS — W010XXA Fall on same level from slipping, tripping and stumbling without subsequent striking against object, initial encounter: Secondary | ICD-10-CM | POA: Insufficient documentation

## 2020-12-11 DIAGNOSIS — Z9621 Cochlear implant status: Secondary | ICD-10-CM | POA: Diagnosis not present

## 2020-12-11 DIAGNOSIS — E782 Mixed hyperlipidemia: Secondary | ICD-10-CM | POA: Diagnosis not present

## 2020-12-11 DIAGNOSIS — Z90710 Acquired absence of both cervix and uterus: Secondary | ICD-10-CM | POA: Diagnosis not present

## 2020-12-11 DIAGNOSIS — R69 Illness, unspecified: Secondary | ICD-10-CM | POA: Diagnosis not present

## 2020-12-11 DIAGNOSIS — F319 Bipolar disorder, unspecified: Secondary | ICD-10-CM | POA: Diagnosis not present

## 2020-12-11 DIAGNOSIS — I700 Atherosclerosis of aorta: Secondary | ICD-10-CM | POA: Diagnosis not present

## 2020-12-11 MED ORDER — KETOROLAC TROMETHAMINE 60 MG/2ML IM SOLN
60.0000 mg | Freq: Once | INTRAMUSCULAR | Status: AC
  Administered 2020-12-11: 60 mg via INTRAMUSCULAR
  Filled 2020-12-11: qty 2

## 2020-12-11 MED ORDER — LIDOCAINE 5 % EX PTCH: 1.0000 | MEDICATED_PATCH | CUTANEOUS | 0 refills | Status: DC

## 2020-12-11 MED ORDER — FENTANYL CITRATE (PF) 100 MCG/2ML IJ SOLN
100.0000 ug | Freq: Once | INTRAMUSCULAR | Status: AC
  Administered 2020-12-11: 100 ug via INTRAMUSCULAR
  Filled 2020-12-11: qty 2

## 2020-12-11 MED ORDER — CYCLOBENZAPRINE HCL 10 MG PO TABS: 10.0000 mg | ORAL_TABLET | Freq: Two times a day (BID) | ORAL | 0 refills | Status: DC | PRN

## 2020-12-11 MED ORDER — CYCLOBENZAPRINE HCL 10 MG PO TABS
10.0000 mg | ORAL_TABLET | Freq: Once | ORAL | Status: AC
  Administered 2020-12-11: 10 mg via ORAL
  Filled 2020-12-11: qty 1

## 2020-12-11 NOTE — ED Provider Notes (Signed)
[facility] DEPT Provider Note   CSN: [id number] Arrival date & time: 12/11/20  1738     History Chief Complaint  Patient presents with  . Fall  . Hip Pain    Elizabeth Stone is a 75 y.o. female.  HPI    75 year old female with history of hyperlipidemia, hypertension, Mnire's disease, cochlear implant, history of balance difficulty for which she is seeing neurology, diabetes, paroxysmal atrial tachycardia, chronic pain disorder, osteoarthritis presents with concern for fall and pain.  Reports that she was bending over to pick something up, stood back up and lost her balance, falling on her right side.  Reports that the pain has been so severe the last few days that she has been unable to sleep at night.  She has been able to bear weight, but reports with significant pain.  The pain is located to her right pelvis, radiating towards her right back and right hip.  She has not had new numbness, weakness.  Reports she did hit her head, but denies headache, nausea, vomiting.  She takes aspirin but no other anticoagulation. Denies neck pain.  Took tylenol PTA and reports it did not help at all. Pain is severe, worse with movements.  No chest pain, dyspnea.  Past Medical History:  Diagnosis Date  . Acute blood loss anemia    hx  . Anxiety   . Arthritis   . Balance problem   . Bipolar disorder ([place])   . Cochlear implant in place   . Cochlear implant in place    right ear  . Depression   . Dermatitis    on face  . Dyspnea   . Fibromyalgia   . GERD (gastroesophageal reflux disease)   . Hard of hearing   . Hearing aid worn    left ear  . Hearing impaired    has cochlear implant  . HTN (hypertension)   . Hypokalemia   . Meniere disease    takes HCTZ for her inner ear problem  . Mixed hyperlipidemia 09/27/2013  . Osteoporosis   . Pneumonia 11/2016  . Pre-diabetes   . Primary localized osteoarthritis of left knee 11/16/2018  . Primary localized  osteoarthritis of right knee   . Slow transit constipation   . Unsteady gait     Patient Active Problem List   Diagnosis Date Noted  . History of cochlear implant 11/25/2020  . Gait abnormality 10/07/2020  . Bilateral low back pain with bilateral sciatica 10/07/2020  . Dyspnea on exertion 04/30/2020  . Hallux rigidus of right foot 12/24/2019  . Hammer toes of both feet 12/24/2019  . Primary localized osteoarthritis of left knee 11/16/2018  . S/P reverse total shoulder arthroplasty, left 01/07/2017  . Bipolar I disorder with depression, severe ([place]) 10/26/2016  . Substance use disorder 10/25/2016  . Severe episode of recurrent major depressive disorder, without psychotic features ([place])   . Impacted cerumen of left ear 03/23/2016  . Primary localized osteoarthritis of right knee   . Rotator cuff tear arthropathy 12/13/2014  . Ulcer of right second toe ([place]) 12/21/2013  . Family history of ischemic heart disease 09/27/2013  . Essential hypertension, benign 09/27/2013  . Mixed hyperlipidemia 09/27/2013  . Arthritis   . Anxiety   . Depression   . GERD (gastroesophageal reflux disease)   . Meniere disease   . Osteoporosis   . Hearing impaired     Past Surgical History:  Procedure Laterality Date  . ABDOMINAL HYSTERECTOMY  1986  .  bladder tack  2007  . COCHLEAR IMPLANT     2007  . COLONOSCOPY    . COLONOSCOPY    . HAMMER TOE SURGERY  2014   left foot  . REVERSE SHOULDER ARTHROPLASTY Right 12/13/2014   dr Tamera Punt  . REVERSE SHOULDER ARTHROPLASTY Right 12/13/2014   Procedure: REVERSE SHOULDER ARTHROPLASTY rt;  Surgeon: Nita Sells, MD;  Location: [facility];  Service: Orthopedics;  Laterality: Right;  Right reverse total shoulder replacement  . REVERSE SHOULDER ARTHROPLASTY Left 01/07/2017   Procedure: REVERSE LEFT TOTAL SHOULDER ARTHROPLASTY;  Surgeon: Tania Ade, MD;  Location: [facility];  Service: Orthopedics;  Laterality: Left;  Left reveres total shoulder  arthroplasty  . TOTAL KNEE ARTHROPLASTY Right 12/16/2015   Procedure: TOTAL KNEE ARTHROPLASTY;  Surgeon: Elsie Saas, MD;  Location: [facility];  Service: Orthopedics;  Laterality: Right;  . TOTAL KNEE ARTHROPLASTY Left 11/28/2018   Procedure: LEFT TOTAL KNEE ARTHROPLASTY;  Surgeon: Elsie Saas, MD;  Location: [facility];  Service: Orthopedics;  Laterality: Left;     OB History   No obstetric history on file.     Family History  Problem Relation Age of Onset  . Heart disease Mother   . Breast cancer Mother   . Cervical cancer Mother   . Diabetes Mother   . Heart disease Father   . Diabetes Maternal Aunt   . Colon cancer Neg Hx   . Esophageal cancer Neg Hx   . Rectal cancer Neg Hx   . Stomach cancer Neg Hx   . Liver cancer Neg Hx   . Pancreatic cancer Neg Hx     Social History   Tobacco Use  . Smoking status: Former Smoker    Packs/day: 0.25    Years: 23.00    Pack years: 5.75    Quit date: 08/25/1993    Years since quitting: 27.3  . Smokeless tobacco: Never Used  Vaping Use  . Vaping Use: Never used  Substance Use Topics  . Alcohol use: Yes    Comment: 2 glasses of wine per night  . Drug use: No    Home Medications Prior to Admission medications   Medication Sig Start Date End Date Taking? Authorizing Provider  acetaminophen (TYLENOL) 500 MG tablet Take 1,000 mg by mouth every 6 (six) hours as needed for mild pain (for pain.).    Yes [provider]  Ascorbic Acid (VITAMIN C PO) Take 500 mg by mouth daily.   Yes [provider]  Asenapine Maleate 10 MG SUBL Place 10 mg under the tongue at bedtime.   Yes [provider]  aspirin 81 MG chewable tablet Chew 81 mg by mouth daily.   Yes [provider]  atorvastatin (LIPITOR) 10 MG tablet Take 10 mg by mouth at bedtime.    Yes [provider]  buPROPion (WELLBUTRIN XL) 300 MG 24 hr tablet Take 300 mg by mouth daily.   Yes [provider]  clonazePAM (KLONOPIN) 0.5 MG  tablet Take 1 mg by mouth at bedtime.   Yes [provider]  cyclobenzaprine (FLEXERIL) 10 MG tablet Take 1 tablet (10 mg total) by mouth 2 (two) times daily as needed for muscle spasms. 12/11/20  Yes Gareth Morgan, MD  DULoxetine (CYMBALTA) 60 MG capsule Take 60 mg by mouth 2 (two) times daily.    Yes [provider]  hydrochlorothiazide (HYDRODIURIL) 25 MG tablet Take 25 mg by mouth Daily.  08/05/12  Yes [provider]  HYDROcodone-acetaminophen [facility]) 48-185  MG tablet Take 1 tablet by mouth every 12 (twelve) hours as needed for severe pain.   Yes [provider]  ketorolac (ACULAR) 0.5 % ophthalmic solution Place 1 drop into the right eye 4 (four) times daily.   Yes [provider]  KLOR-CON M20 20 MEQ tablet Take 20 mEq by mouth daily.  07/18/12  Yes [provider]  lamoTRIgine (LAMICTAL) 150 MG tablet Take 300 mg by mouth daily.   Yes [provider]  lidocaine (LIDODERM) 5 % Place 1 patch onto the skin daily. Remove & Discard patch within 12 hours or as directed by MD 12/11/20  Yes Gareth Morgan, MD  meloxicam (MOBIC) 15 MG tablet Take 15 mg by mouth daily.   Yes [provider]  metFORMIN (GLUCOPHAGE-XR) 750 MG 24 hr tablet Take 750 mg by mouth daily with breakfast.   Yes [provider]  montelukast (SINGULAIR) 10 MG tablet Take 10 mg by mouth at bedtime. 07/15/12  Yes [provider]  prednisoLONE acetate (PRED FORTE) 1 % ophthalmic suspension Place 1 drop into the right eye 3 (three) times daily.   Yes [provider]  VITAMIN D PO Take 1,000 Units by mouth daily.   Yes [provider]  albuterol (VENTOLIN HFA) 108 (90 Base) MCG/ACT inhaler Inhale 2 puffs into the lungs every 6 (six) hours as needed for wheezing or shortness of breath. 06/13/20   Byrum, Rose Fillers, MD    Allergies    Adhesive [tape]  Review of Systems   Review of Systems  Constitutional: Negative for fever.   HENT: Negative for sore throat.   Eyes: Negative for visual disturbance.  Respiratory: Negative for cough and shortness of breath.   Cardiovascular: Negative for chest pain.  Gastrointestinal: Negative for abdominal pain, nausea and vomiting.  Genitourinary: Positive for flank pain. Negative for difficulty urinating.  Musculoskeletal: Positive for arthralgias, back pain and gait problem. Negative for neck pain.  Skin: Negative for rash.  Neurological: Negative for syncope and headaches.    Physical Exam Updated Vital Signs BP (!) 171/74   Pulse 91   Temp 98.4 F (36.9 C)   Resp 18   Ht 5\' 1"  (1.549 m)   Wt 75 kg   LMP  (LMP Unknown)   SpO2 92%   BMI 31.24 kg/m   Physical Exam Vitals and nursing note reviewed.  Constitutional:      General: She is not in acute distress.    Appearance: She is well-developed and well-nourished. She is not diaphoretic.  HENT:     Head: Normocephalic and atraumatic.  Eyes:     Extraocular Movements: EOM normal.     Conjunctiva/sclera: Conjunctivae normal.  Cardiovascular:     Rate and Rhythm: Normal rate and regular rhythm.     Pulses: Intact distal pulses.     Heart sounds: Normal heart sounds. No murmur heard. No friction rub. No gallop.   Pulmonary:     Effort: Pulmonary effort is normal. No respiratory distress.     Breath sounds: Normal breath sounds.  Abdominal:     General: There is no distension.     Palpations: Abdomen is soft.     Tenderness: There is abdominal tenderness (mild right lower quadrant). There is no guarding.  Musculoskeletal:        General: Tenderness (over right iliac crest extending towards right sacrum, lumbar, right sided, mildine lumbar tenderness) present. No edema.     Cervical back: Normal range of motion.  Comments: Able to ROM right hip but reports pain  Skin:    General: Skin is warm and dry.     Findings: No erythema or rash.  Neurological:     Mental Status: She is alert and oriented to  person, place, and time.     ED Results / Procedures / Treatments   Labs (all labs ordered are listed, but only abnormal results are displayed) Labs Reviewed - No data to display  EKG None  Radiology CT ABDOMEN PELVIS WO CONTRAST  Result Date: 12/11/2020 CLINICAL DATA:  Right flank pain EXAM: CT ABDOMEN AND PELVIS WITHOUT CONTRAST TECHNIQUE: Multidetector CT imaging of the abdomen and pelvis was performed following the standard protocol without IV contrast. COMPARISON:  CT abdomen pelvis 09/02/2016 FINDINGS: LOWER CHEST: Normal. HEPATOBILIARY: Normal hepatic contours. No intra- or extrahepatic biliary dilatation. The gallbladder is normal. PANCREAS: Normal pancreas. No ductal dilatation or peripancreatic fluid collection. SPLEEN: Normal. ADRENALS/URINARY TRACT: The adrenal glands are normal. No hydronephrosis, nephroureterolithiasis or solid renal mass. The urinary bladder is normal for degree of distention STOMACH/BOWEL: There is no hiatal hernia. Normal duodenal course and caliber. No small bowel dilatation or inflammation. No focal colonic abnormality. Normal appendix. VASCULAR/LYMPHATIC: There is calcific atherosclerosis of the abdominal aorta. No lymphadenopathy. REPRODUCTIVE: Status post hysterectomy. No adnexal mass. MUSCULOSKELETAL. No bony spinal canal stenosis or focal osseous abnormality. OTHER: None. IMPRESSION: 1. No acute abnormality of the abdomen or pelvis. Aortic Atherosclerosis (ICD10-I70.0). Electronically Signed   By: Ulyses Jarred M.D.   On: 12/11/2020 20:24   DG HIP UNILAT WITH PELVIS 2-3 VIEWS RIGHT  Result Date: 12/11/2020 CLINICAL DATA:  Status post fall.  Right hip pain. EXAM: DG HIP (WITH OR WITHOUT PELVIS) 2-3V RIGHT COMPARISON:  None. FINDINGS: There is no evidence of hip fracture or dislocation. Mild bilateral hip joint space narrowing with marginal spur formation. Degenerative disc disease noted within the imaged portions of the lumbar spine. IMPRESSION: 1. No acute  findings. 2. Mild bilateral hip osteoarthritis. Electronically Signed   By: Kerby Moors M.D.   On: 12/11/2020 19:29    Procedures Procedures   Medications Ordered in ED Medications  fentaNYL (SUBLIMAZE) injection 100 mcg (100 mcg Intramuscular Given 12/11/20 1819)  cyclobenzaprine (FLEXERIL) tablet 10 mg (10 mg Oral Given 12/11/20 2205)  ketorolac (TORADOL) injection 60 mg (60 mg Intramuscular Given 12/11/20 2205)    ED Course  I have reviewed the triage vital signs and the nursing notes.  Pertinent labs & imaging results that were available during my care of the patient were reviewed by me and considered in my medical decision making (see chart for details).    MDM Rules/Calculators/A&P                          75 year old female with history of hyperlipidemia, hypertension, Mnire's disease, cochlear implant, history of balance difficulty for which she is seeing neurology, diabetes, paroxysmal atrial tachycardia, chronic pain disorder, osteoarthritis presents with concern for fall and pain.    Denies headache, nausea, vomiting, is not on anticoagulation, with injury happening several days ago and no sign of skull fracture and have low suspicion for intracranial hemorrhage. Hx and exam not consistent with cervical spine injury, injury to chest.  Has pain throughout right side of pelvis, lower back, right flank (not rib tenderness.)  X-ray hip was obtained which showed no evidence of fracture. Given concern for possible occult injury specifically to pelvis, lumbar spine, sacrum, CT abdomen pelvis  without contrast was ordered which showed no evidence of focal osseous abnormality/fracture, or evidence of other injuries.  Suspect likely muscular pain. Given rx for flexeril and lidocaine patch. Patient discharged in stable condition with understanding of reasons to return.      Final Clinical Impression(s) / ED Diagnoses Final diagnoses:  Hip pain  Fall, initial encounter  Lumbar back  pain    Rx / DC Orders ED Discharge Orders         Ordered    cyclobenzaprine (FLEXERIL) 10 MG tablet  2 times daily PRN        12/11/20 2146    lidocaine (LIDODERM) 5 %  Every 24 hours        12/11/20 2146           Gareth Morgan, MD 12/12/20 1134

## 2020-12-11 NOTE — ED Triage Notes (Signed)
S/p fall Sunday, right hip pain/thigh, patient ambulatory on ems arrival.

## 2020-12-18 DIAGNOSIS — M25559 Pain in unspecified hip: Secondary | ICD-10-CM | POA: Diagnosis not present

## 2020-12-18 DIAGNOSIS — Z0000 Encounter for general adult medical examination without abnormal findings: Secondary | ICD-10-CM | POA: Diagnosis not present

## 2020-12-18 DIAGNOSIS — G894 Chronic pain syndrome: Secondary | ICD-10-CM | POA: Diagnosis not present

## 2021-01-03 DIAGNOSIS — C44719 Basal cell carcinoma of skin of left lower limb, including hip: Secondary | ICD-10-CM | POA: Diagnosis not present

## 2021-01-03 DIAGNOSIS — L821 Other seborrheic keratosis: Secondary | ICD-10-CM | POA: Diagnosis not present

## 2021-01-08 ENCOUNTER — Encounter (INDEPENDENT_AMBULATORY_CARE_PROVIDER_SITE_OTHER): Payer: Medicare Other | Admitting: Ophthalmology

## 2021-01-09 ENCOUNTER — Other Ambulatory Visit: Payer: Self-pay

## 2021-01-09 ENCOUNTER — Encounter (INDEPENDENT_AMBULATORY_CARE_PROVIDER_SITE_OTHER): Payer: Medicare Other | Admitting: Ophthalmology

## 2021-01-09 DIAGNOSIS — H43813 Vitreous degeneration, bilateral: Secondary | ICD-10-CM | POA: Diagnosis not present

## 2021-01-09 DIAGNOSIS — E113293 Type 2 diabetes mellitus with mild nonproliferative diabetic retinopathy without macular edema, bilateral: Secondary | ICD-10-CM

## 2021-01-09 DIAGNOSIS — H353111 Nonexudative age-related macular degeneration, right eye, early dry stage: Secondary | ICD-10-CM | POA: Diagnosis not present

## 2021-01-14 DIAGNOSIS — F3181 Bipolar II disorder: Secondary | ICD-10-CM | POA: Diagnosis not present

## 2021-01-28 DIAGNOSIS — M47816 Spondylosis without myelopathy or radiculopathy, lumbar region: Secondary | ICD-10-CM | POA: Diagnosis not present

## 2021-01-30 DIAGNOSIS — L570 Actinic keratosis: Secondary | ICD-10-CM | POA: Diagnosis not present

## 2021-01-30 DIAGNOSIS — E119 Type 2 diabetes mellitus without complications: Secondary | ICD-10-CM | POA: Diagnosis not present

## 2021-02-05 DIAGNOSIS — Z6831 Body mass index (BMI) 31.0-31.9, adult: Secondary | ICD-10-CM | POA: Diagnosis not present

## 2021-02-05 DIAGNOSIS — M1990 Unspecified osteoarthritis, unspecified site: Secondary | ICD-10-CM | POA: Diagnosis not present

## 2021-02-05 DIAGNOSIS — E119 Type 2 diabetes mellitus without complications: Secondary | ICD-10-CM | POA: Diagnosis not present

## 2021-02-15 DIAGNOSIS — E782 Mixed hyperlipidemia: Secondary | ICD-10-CM | POA: Diagnosis not present

## 2021-02-15 DIAGNOSIS — K219 Gastro-esophageal reflux disease without esophagitis: Secondary | ICD-10-CM | POA: Diagnosis not present

## 2021-02-15 DIAGNOSIS — M810 Age-related osteoporosis without current pathological fracture: Secondary | ICD-10-CM | POA: Diagnosis not present

## 2021-02-15 DIAGNOSIS — F319 Bipolar disorder, unspecified: Secondary | ICD-10-CM | POA: Diagnosis not present

## 2021-02-20 DIAGNOSIS — R2689 Other abnormalities of gait and mobility: Secondary | ICD-10-CM | POA: Diagnosis not present

## 2021-02-20 DIAGNOSIS — R109 Unspecified abdominal pain: Secondary | ICD-10-CM | POA: Diagnosis not present

## 2021-03-15 ENCOUNTER — Emergency Department (HOSPITAL_COMMUNITY)
Admission: EM | Admit: 2021-03-15 | Discharge: 2021-03-15 | Disposition: A | Discharge status: Home / Self Care | Payer: Medicare Other | Attending: Emergency Medicine | Admitting: Emergency Medicine

## 2021-03-15 ENCOUNTER — Emergency Department (HOSPITAL_COMMUNITY): Payer: Medicare Other

## 2021-03-15 ENCOUNTER — Encounter (HOSPITAL_COMMUNITY): Payer: Self-pay | Admitting: Emergency Medicine

## 2021-03-15 DIAGNOSIS — W19XXXA Unspecified fall, initial encounter: Secondary | ICD-10-CM | POA: Diagnosis not present

## 2021-03-15 DIAGNOSIS — Z7984 Long term (current) use of oral hypoglycemic drugs: Secondary | ICD-10-CM | POA: Insufficient documentation

## 2021-03-15 DIAGNOSIS — Z043 Encounter for examination and observation following other accident: Secondary | ICD-10-CM | POA: Diagnosis not present

## 2021-03-15 DIAGNOSIS — M47812 Spondylosis without myelopathy or radiculopathy, cervical region: Secondary | ICD-10-CM | POA: Diagnosis not present

## 2021-03-15 DIAGNOSIS — Z96653 Presence of artificial knee joint, bilateral: Secondary | ICD-10-CM | POA: Diagnosis not present

## 2021-03-15 DIAGNOSIS — Z7982 Long term (current) use of aspirin: Secondary | ICD-10-CM | POA: Insufficient documentation

## 2021-03-15 DIAGNOSIS — Z87891 Personal history of nicotine dependence: Secondary | ICD-10-CM | POA: Insufficient documentation

## 2021-03-15 DIAGNOSIS — Z23 Encounter for immunization: Secondary | ICD-10-CM | POA: Insufficient documentation

## 2021-03-15 DIAGNOSIS — M4312 Spondylolisthesis, cervical region: Secondary | ICD-10-CM | POA: Diagnosis not present

## 2021-03-15 DIAGNOSIS — I10 Essential (primary) hypertension: Secondary | ICD-10-CM | POA: Diagnosis not present

## 2021-03-15 DIAGNOSIS — W01198A Fall on same level from slipping, tripping and stumbling with subsequent striking against other object, initial encounter: Secondary | ICD-10-CM | POA: Diagnosis not present

## 2021-03-15 DIAGNOSIS — S0101XA Laceration without foreign body of scalp, initial encounter: Secondary | ICD-10-CM

## 2021-03-15 DIAGNOSIS — R0902 Hypoxemia: Secondary | ICD-10-CM | POA: Diagnosis not present

## 2021-03-15 DIAGNOSIS — Z96612 Presence of left artificial shoulder joint: Secondary | ICD-10-CM | POA: Diagnosis not present

## 2021-03-15 DIAGNOSIS — Z96611 Presence of right artificial shoulder joint: Secondary | ICD-10-CM | POA: Diagnosis not present

## 2021-03-15 DIAGNOSIS — Z79899 Other long term (current) drug therapy: Secondary | ICD-10-CM | POA: Diagnosis not present

## 2021-03-15 DIAGNOSIS — R58 Hemorrhage, not elsewhere classified: Secondary | ICD-10-CM | POA: Diagnosis not present

## 2021-03-15 MED ORDER — TETANUS-DIPHTH-ACELL PERTUSSIS 5-2.5-18.5 LF-MCG/0.5 IM SUSY
0.5000 mL | PREFILLED_SYRINGE | Freq: Once | INTRAMUSCULAR | Status: AC
  Administered 2021-03-15: 0.5 mL via INTRAMUSCULAR
  Filled 2021-03-15: qty 0.5

## 2021-03-15 MED ORDER — ACETAMINOPHEN 325 MG PO TABS
650.0000 mg | ORAL_TABLET | Freq: Once | ORAL | Status: AC
  Administered 2021-03-15: 650 mg via ORAL
  Filled 2021-03-15: qty 2

## 2021-03-15 NOTE — ED Provider Notes (Signed)
[facility] DEPT Provider Note   CSN: [id number] Arrival date & time: 03/15/21  0307     History Chief Complaint  Patient presents with  . Fall    Elizabeth Stone is a 75 y.o. female.  The history is provided by the patient and the EMS personnel.  Fall The current episode started less than 1 hour ago. The problem occurs constantly. The problem has been resolved. Pertinent negatives include no chest pain, no abdominal pain, no headaches and no shortness of breath. Nothing aggravates the symptoms. Nothing relieves the symptoms. She has tried nothing for the symptoms. The treatment provided no relief.  Patient with balance issues who Slipped and fall hitting back of head on trash can and has small laceration to the back of the head.       Past Medical History:  Diagnosis Date  . Acute blood loss anemia    hx  . Anxiety   . Arthritis   . Balance problem   . Bipolar disorder ([place])   . Cochlear implant in place   . Cochlear implant in place    right ear  . Depression   . Dermatitis    on face  . Dyspnea   . Fibromyalgia   . GERD (gastroesophageal reflux disease)   . Hard of hearing   . Hearing aid worn    left ear  . Hearing impaired    has cochlear implant  . HTN (hypertension)   . Hypokalemia   . Meniere disease    takes HCTZ for her inner ear problem  . Mixed hyperlipidemia 09/27/2013  . Osteoporosis   . Pneumonia 11/2016  . Pre-diabetes   . Primary localized osteoarthritis of left knee 11/16/2018  . Primary localized osteoarthritis of right knee   . Slow transit constipation   . Unsteady gait     Patient Active Problem List   Diagnosis Date Noted  . History of cochlear implant 11/25/2020  . Gait abnormality 10/07/2020  . Bilateral low back pain with bilateral sciatica 10/07/2020  . Dyspnea on exertion 04/30/2020  . Hallux rigidus of right foot 12/24/2019  . Hammer toes of both feet 12/24/2019  . Primary localized  osteoarthritis of left knee 11/16/2018  . S/P reverse total shoulder arthroplasty, left 01/07/2017  . Bipolar I disorder with depression, severe ([place]) 10/26/2016  . Substance use disorder 10/25/2016  . Severe episode of recurrent major depressive disorder, without psychotic features ([place])   . Impacted cerumen of left ear 03/23/2016  . Primary localized osteoarthritis of right knee   . Rotator cuff tear arthropathy 12/13/2014  . Ulcer of right second toe ([place]) 12/21/2013  . Family history of ischemic heart disease 09/27/2013  . Essential hypertension, benign 09/27/2013  . Mixed hyperlipidemia 09/27/2013  . Arthritis   . Anxiety   . Depression   . GERD (gastroesophageal reflux disease)   . Meniere disease   . Osteoporosis   . Hearing impaired     Past Surgical History:  Procedure Laterality Date  . ABDOMINAL HYSTERECTOMY  1986  . bladder tack  2007  . COCHLEAR IMPLANT     2007  . COLONOSCOPY    . COLONOSCOPY    . HAMMER TOE SURGERY  2014   left foot  . REVERSE SHOULDER ARTHROPLASTY Right 12/13/2014   dr Tamera Punt  . REVERSE SHOULDER ARTHROPLASTY Right 12/13/2014   Procedure: REVERSE SHOULDER ARTHROPLASTY rt;  Surgeon: Nita Sells, MD;  Location: [facility];  Service: Orthopedics;  Laterality: Right;  Right reverse total shoulder replacement  . REVERSE SHOULDER ARTHROPLASTY Left 01/07/2017   Procedure: REVERSE LEFT TOTAL SHOULDER ARTHROPLASTY;  Surgeon: Tania Ade, MD;  Location: [facility];  Service: Orthopedics;  Laterality: Left;  Left reveres total shoulder arthroplasty  . TOTAL KNEE ARTHROPLASTY Right 12/16/2015   Procedure: TOTAL KNEE ARTHROPLASTY;  Surgeon: Elsie Saas, MD;  Location: [facility];  Service: Orthopedics;  Laterality: Right;  . TOTAL KNEE ARTHROPLASTY Left 11/28/2018   Procedure: LEFT TOTAL KNEE ARTHROPLASTY;  Surgeon: Elsie Saas, MD;  Location: [facility];  Service: Orthopedics;  Laterality: Left;     OB History   No obstetric history on file.      Family History  Problem Relation Age of Onset  . Heart disease Mother   . Breast cancer Mother   . Cervical cancer Mother   . Diabetes Mother   . Heart disease Father   . Diabetes Maternal Aunt   . Colon cancer Neg Hx   . Esophageal cancer Neg Hx   . Rectal cancer Neg Hx   . Stomach cancer Neg Hx   . Liver cancer Neg Hx   . Pancreatic cancer Neg Hx     Social History   Tobacco Use  . Smoking status: Former Smoker    Packs/day: 0.25    Years: 23.00    Pack years: 5.75    Quit date: 08/25/1993    Years since quitting: 27.5  . Smokeless tobacco: Never Used  Vaping Use  . Vaping Use: Never used  Substance Use Topics  . Alcohol use: Yes    Comment: 2 glasses of wine per night  . Drug use: No    Home Medications Prior to Admission medications   Medication Sig Start Date End Date Taking? Authorizing Provider  acetaminophen (TYLENOL) 500 MG tablet Take 1,000 mg by mouth every 6 (six) hours as needed for mild pain (for pain.).     [provider]  albuterol (VENTOLIN HFA) 108 (90 Base) MCG/ACT inhaler Inhale 2 puffs into the lungs every 6 (six) hours as needed for wheezing or shortness of breath. 06/13/20   Byrum, Rose Fillers, MD  Ascorbic Acid (VITAMIN C PO) Take 500 mg by mouth daily.    [provider]  Asenapine Maleate 10 MG SUBL Place 10 mg under the tongue at bedtime.    [provider]  aspirin 81 MG chewable tablet Chew 81 mg by mouth daily.    [provider]  atorvastatin (LIPITOR) 10 MG tablet Take 10 mg by mouth at bedtime.     [provider]  buPROPion (WELLBUTRIN XL) 300 MG 24 hr tablet Take 300 mg by mouth daily.    [provider]  clonazePAM (KLONOPIN) 0.5 MG tablet Take 1 mg by mouth at bedtime.    [provider]  COVID-19 mRNA vaccine, Pfizer, 30 MCG/0.3ML injection USE AS DIRECTED 09/18/20 09/18/21  Carlyle Basques, MD  cyclobenzaprine (FLEXERIL) 10 MG tablet Take 1 tablet (10 mg total) by mouth  2 (two) times daily as needed for muscle spasms. 12/11/20   Gareth Morgan, MD  DULoxetine (CYMBALTA) 60 MG capsule Take 60 mg by mouth 2 (two) times daily.     [provider]  hydrochlorothiazide (HYDRODIURIL) 25 MG tablet Take 25 mg by mouth Daily.  08/05/12   [provider]  HYDROcodone-acetaminophen (NORCO) 10-325 MG tablet Take 1 tablet by mouth every 12 (twelve) hours as needed for severe pain.    [provider]  ketorolac (ACULAR) 0.5 % ophthalmic solution Place 1 drop into the right eye 4 (four) times daily.    [provider]  KLOR-CON M20 20 MEQ tablet Take 20 mEq by mouth daily.  07/18/12   [provider]  lamoTRIgine (LAMICTAL) 150 MG tablet Take 300 mg by mouth daily.    [provider]  lidocaine (LIDODERM) 5 % Place 1 patch onto the skin daily. Remove & Discard patch within 12 hours or as directed by MD 12/11/20   Gareth Morgan, MD  meloxicam (MOBIC) 15 MG tablet Take 15 mg by mouth daily.    [provider]  metFORMIN (GLUCOPHAGE-XR) 750 MG 24 hr tablet Take 750 mg by mouth daily with breakfast.    [provider]  montelukast (SINGULAIR) 10 MG tablet Take 10 mg by mouth at bedtime. 07/15/12   [provider]  prednisoLONE acetate (PRED FORTE) 1 % ophthalmic suspension Place 1 drop into the right eye 3 (three) times daily.    [provider]  VITAMIN D PO Take 1,000 Units by mouth daily.    [provider]    Allergies    Adhesive [tape]  Review of Systems   Review of Systems  Constitutional: Negative for fever.  HENT: Negative for facial swelling.   Eyes: Negative for redness.  Respiratory: Negative for shortness of breath and wheezing.   Cardiovascular: Negative for chest pain and leg swelling.  Gastrointestinal: Negative for abdominal pain and vomiting.  Genitourinary: Negative for flank pain.  Musculoskeletal: Negative for back pain and neck pain.  Skin: Negative for  rash.  Neurological: Negative for facial asymmetry and headaches.  Psychiatric/Behavioral: Negative for agitation.  All other systems reviewed and are negative.   Physical Exam Updated Vital Signs BP (!) 147/59   Pulse 81   Temp 97.8 F (36.6 C) (Oral)   Resp 18   Ht 5\' 1"  (1.549 m)   Wt 34 kg   LMP  (LMP Unknown)   SpO2 91%   BMI 14.17 kg/m   Physical Exam Vitals and nursing note reviewed.  Constitutional:      Appearance: Normal appearance.  HENT:     Head: Normocephalic.      Nose: Nose normal.  Eyes:     Conjunctiva/sclera: Conjunctivae normal.     Pupils: Pupils are equal, round, and reactive to light.  Cardiovascular:     Rate and Rhythm: Normal rate and regular rhythm.     Pulses: Normal pulses.     Heart sounds: Normal heart sounds.  Pulmonary:     Effort: Pulmonary effort is normal.     Breath sounds: Normal breath sounds.  Abdominal:     General: Abdomen is flat. Bowel sounds are normal.     Palpations: Abdomen is soft.     Tenderness: There is no abdominal tenderness. There is no guarding.  Musculoskeletal:        General: Normal range of motion.     Cervical back: Normal range of motion and neck supple.  Skin:    General: Skin is warm and dry.     Capillary Refill: Capillary refill takes less than 2 seconds.  Neurological:     General: No focal deficit present.     Mental Status: She is alert and oriented to person, place, and time.     Deep Tendon Reflexes: Reflexes normal.  Psychiatric:        Mood and Affect: Mood normal.        Behavior:  Behavior normal.     ED Results / Procedures / Treatments   Labs (all labs ordered are listed, but only abnormal results are displayed) Labs Reviewed - No data to display  EKG None  Radiology CT Head Wo Contrast  Result Date: 03/15/2021 CLINICAL DATA:  Mechanical fall EXAM: CT HEAD WITHOUT CONTRAST CT CERVICAL SPINE WITHOUT CONTRAST TECHNIQUE: Multidetector CT imaging of the head and cervical spine  was performed following the standard protocol without intravenous contrast. Multiplanar CT image reconstructions of the cervical spine were also generated. COMPARISON:  10/29/2020 head CT FINDINGS: CT HEAD FINDINGS Brain: No evidence of acute infarction, hemorrhage, hydrocephalus, extra-axial collection or mass lesion/mass effect. Vascular: No hyperdense vessel or unexpected calcification. Skull: Normal. Negative for fracture or focal lesion. Sinuses/Orbits: No acute finding. Other: Cochlear implant on the right with associated unavoidable artifact CT CERVICAL SPINE FINDINGS Alignment: No traumatic malalignment. Mild degenerative anterolisthesis at C3-4. Skull base and vertebrae: No acute fracture. No destructive lesion. Sclerosis about the T1-2 interspace which is degenerative appearing Soft tissues and spinal canal: No prevertebral fluid or swelling. No visible canal hematoma. Disc levels: Degenerative disc narrowing and ridging at C3-4 and below. Multilevel degenerative facet spurring. Upper chest: No acute finding Other: Streak artifact from patient's shoulder replacements. IMPRESSION: No evidence of acute intracranial or cervical spine injury. Electronically Signed   By: Monte Fantasia M.D.   On: 03/15/2021 04:22   CT Cervical Spine Wo Contrast  Result Date: 03/15/2021 CLINICAL DATA:  Mechanical fall EXAM: CT HEAD WITHOUT CONTRAST CT CERVICAL SPINE WITHOUT CONTRAST TECHNIQUE: Multidetector CT imaging of the head and cervical spine was performed following the standard protocol without intravenous contrast. Multiplanar CT image reconstructions of the cervical spine were also generated. COMPARISON:  10/29/2020 head CT FINDINGS: CT HEAD FINDINGS Brain: No evidence of acute infarction, hemorrhage, hydrocephalus, extra-axial collection or mass lesion/mass effect. Vascular: No hyperdense vessel or unexpected calcification. Skull: Normal. Negative for fracture or focal lesion. Sinuses/Orbits: No acute finding.  Other: Cochlear implant on the right with associated unavoidable artifact CT CERVICAL SPINE FINDINGS Alignment: No traumatic malalignment. Mild degenerative anterolisthesis at C3-4. Skull base and vertebrae: No acute fracture. No destructive lesion. Sclerosis about the T1-2 interspace which is degenerative appearing Soft tissues and spinal canal: No prevertebral fluid or swelling. No visible canal hematoma. Disc levels: Degenerative disc narrowing and ridging at C3-4 and below. Multilevel degenerative facet spurring. Upper chest: No acute finding Other: Streak artifact from patient's shoulder replacements. IMPRESSION: No evidence of acute intracranial or cervical spine injury. Electronically Signed   By: Monte Fantasia M.D.   On: 03/15/2021 04:22    Procedures .Marland KitchenLaceration Repair  Date/Time: 03/15/2021 4:29 AM Performed by: Veatrice Kells, MD Authorized by: Veatrice Kells, MD   Consent:    Consent obtained:  Verbal   Risks discussed:  Infection, need for additional repair, nerve damage, pain, poor cosmetic result, retained foreign body, tendon damage and vascular damage Universal protocol:    Patient identity confirmed:  Arm band Anesthesia:    Anesthesia method:  None Laceration details:    Location:  Scalp   Scalp location:  Occipital   Length (cm):  0.9   Depth (mm):  1 Pre-procedure details:    Preparation:  Patient was prepped and draped in usual sterile fashion Exploration:    Limited defect created (wound extended): no     Hemostasis achieved with:  Direct pressure   Wound extent: no areolar tissue violation noted     Contaminated: no  Treatment:    Area cleansed with:  Povidone-iodine and chlorhexidine   Amount of cleaning:  Extensive   Irrigation solution:  Sterile saline   Irrigation method:  Syringe   Debridement:  None   Undermining:  None Skin repair:    Repair method:  Staples   Number of staples:  1 Approximation:    Approximation:  Close Repair type:     Repair type:  Simple Post-procedure details:    Dressing:  Open (no dressing)   Procedure completion:  Tolerated well, no immediate complications     Medications Ordered in ED Medications  Tdap (BOOSTRIX) injection 0.5 mL (0.5 mLs Intramuscular Given 03/15/21 0425)  acetaminophen (TYLENOL) tablet 650 mg (650 mg Oral Given 03/15/21 0424)    ED Course  I have reviewed the triage vital signs and the nursing notes.  Pertinent labs & imaging results that were available during my care of the patient were reviewed by me and considered in my medical decision making (see chart for details).  Elizabeth Stone was evaluated in Emergency Department on 03/15/2021 for the symptoms described in the history of present illness. She was evaluated in the context of the global COVID-19 pandemic, which necessitated consideration that the patient might be at risk for infection with the SARS-CoV-2 virus that causes COVID-19. Institutional protocols and algorithms that pertain to the evaluation of patients at risk for COVID-19 are in a state of rapid change based on information released by regulatory bodies including the CDC and federal and state organizations. These policies and algorithms were followed during the patient's care in the ED.  Final Clinical Impression(s) / ED Diagnoses Final diagnoses:  Fall, initial encounter   Return for intractable cough, coughing up blood, fevers >100.4 unrelieved by medication, shortness of breath, intractable vomiting, chest pain, shortness of breath, weakness, numbness, changes in speech, facial asymmetry, abdominal pain, passing out, Inability to tolerate liquids or food, cough, altered mental status or any concerns. No signs of systemic illness or infection. The patient is nontoxic-appearing on exam and vital signs are within normal limits.  I have reviewed the triage vital signs and the nursing notes. Pertinent labs & imaging results that were available during my care of the  patient were reviewed by me and considered in my medical decision making (see chart for details). After history, exam, and medical workup I feel the patient has been appropriately medically screened and is safe for discharge home. Pertinent diagnoses were discussed with the patient. Patient was given return precautions.      Afton Lavalle, MD 03/15/21 [phone]

## 2021-03-15 NOTE — ED Triage Notes (Signed)
Pt arrives via EMS from home. Mechanical fall. Pt hit the back of her head on a trash can. Small hematoma on the back of head. No bloodthinners. Pt is deaf with a cochlear device. HX diabetes CBG 144 with EMS.

## 2021-03-15 NOTE — ED Notes (Signed)
Pt noted to be very unsteady on feet, consulted with Dr. Randal Buba. Pt provided with walker and prescription for walker to take home at discharge.

## 2021-03-15 NOTE — ED Notes (Addendum)
Pt given discharge paperwork and escorted out of the ED to her son for transport home. Unable to sign for discharge due to placement in hall bed.

## 2021-03-15 NOTE — ED Notes (Addendum)
Pt son on the way to pick her up and transport her home after discharge.

## 2021-03-19 DIAGNOSIS — Z1231 Encounter for screening mammogram for malignant neoplasm of breast: Secondary | ICD-10-CM | POA: Diagnosis not present

## 2021-03-20 DIAGNOSIS — E113392 Type 2 diabetes mellitus with moderate nonproliferative diabetic retinopathy without macular edema, left eye: Secondary | ICD-10-CM | POA: Diagnosis not present

## 2021-03-20 DIAGNOSIS — R1032 Left lower quadrant pain: Secondary | ICD-10-CM | POA: Diagnosis not present

## 2021-03-20 DIAGNOSIS — M545 Low back pain, unspecified: Secondary | ICD-10-CM | POA: Diagnosis not present

## 2021-03-20 DIAGNOSIS — E782 Mixed hyperlipidemia: Secondary | ICD-10-CM | POA: Diagnosis not present

## 2021-03-24 ENCOUNTER — Other Ambulatory Visit: Payer: Self-pay | Admitting: Family Medicine

## 2021-03-24 DIAGNOSIS — R1032 Left lower quadrant pain: Secondary | ICD-10-CM

## 2021-04-02 ENCOUNTER — Other Ambulatory Visit: Payer: Medicare Other

## 2021-04-07 DIAGNOSIS — R296 Repeated falls: Secondary | ICD-10-CM | POA: Diagnosis not present

## 2021-04-07 DIAGNOSIS — M4726 Other spondylosis with radiculopathy, lumbar region: Secondary | ICD-10-CM | POA: Diagnosis not present

## 2021-04-11 DIAGNOSIS — M810 Age-related osteoporosis without current pathological fracture: Secondary | ICD-10-CM | POA: Diagnosis not present

## 2021-04-11 DIAGNOSIS — E782 Mixed hyperlipidemia: Secondary | ICD-10-CM | POA: Diagnosis not present

## 2021-04-11 DIAGNOSIS — E1169 Type 2 diabetes mellitus with other specified complication: Secondary | ICD-10-CM | POA: Diagnosis not present

## 2021-04-11 DIAGNOSIS — G4700 Insomnia, unspecified: Secondary | ICD-10-CM | POA: Diagnosis not present

## 2021-04-16 ENCOUNTER — Other Ambulatory Visit: Payer: Medicare Other

## 2021-04-17 ENCOUNTER — Ambulatory Visit
Admission: RE | Admit: 2021-04-17 | Discharge: 2021-04-17 | Disposition: A | Discharge status: Home / Self Care | Payer: Medicare Other | Source: Ambulatory Visit | Attending: Family Medicine | Admitting: Family Medicine

## 2021-04-17 DIAGNOSIS — K760 Fatty (change of) liver, not elsewhere classified: Secondary | ICD-10-CM | POA: Diagnosis not present

## 2021-04-17 DIAGNOSIS — R1032 Left lower quadrant pain: Secondary | ICD-10-CM

## 2021-04-17 MED ORDER — IOPAMIDOL (ISOVUE-300) INJECTION 61%
100.0000 mL | Freq: Once | INTRAVENOUS | Status: AC | PRN
  Administered 2021-04-17: 100 mL via INTRAVENOUS

## 2021-04-20 ENCOUNTER — Emergency Department (HOSPITAL_COMMUNITY)
Admission: EM | Admit: 2021-04-20 | Discharge: 2021-04-20 | Disposition: A | Discharge status: Home / Self Care | Payer: Medicare Other | Attending: Emergency Medicine | Admitting: Emergency Medicine

## 2021-04-20 ENCOUNTER — Encounter (HOSPITAL_COMMUNITY): Payer: Self-pay

## 2021-04-20 ENCOUNTER — Emergency Department (HOSPITAL_COMMUNITY): Payer: Medicare Other

## 2021-04-20 ENCOUNTER — Other Ambulatory Visit: Payer: Self-pay

## 2021-04-20 DIAGNOSIS — I10 Essential (primary) hypertension: Secondary | ICD-10-CM | POA: Insufficient documentation

## 2021-04-20 DIAGNOSIS — R110 Nausea: Secondary | ICD-10-CM | POA: Insufficient documentation

## 2021-04-20 DIAGNOSIS — R0902 Hypoxemia: Secondary | ICD-10-CM | POA: Diagnosis not present

## 2021-04-20 DIAGNOSIS — Z7982 Long term (current) use of aspirin: Secondary | ICD-10-CM | POA: Diagnosis not present

## 2021-04-20 DIAGNOSIS — R0689 Other abnormalities of breathing: Secondary | ICD-10-CM | POA: Diagnosis not present

## 2021-04-20 DIAGNOSIS — R42 Dizziness and giddiness: Secondary | ICD-10-CM | POA: Diagnosis not present

## 2021-04-20 DIAGNOSIS — R1084 Generalized abdominal pain: Secondary | ICD-10-CM | POA: Diagnosis not present

## 2021-04-20 DIAGNOSIS — Z96611 Presence of right artificial shoulder joint: Secondary | ICD-10-CM | POA: Insufficient documentation

## 2021-04-20 DIAGNOSIS — M545 Low back pain, unspecified: Secondary | ICD-10-CM | POA: Diagnosis not present

## 2021-04-20 DIAGNOSIS — Z79899 Other long term (current) drug therapy: Secondary | ICD-10-CM | POA: Insufficient documentation

## 2021-04-20 DIAGNOSIS — K219 Gastro-esophageal reflux disease without esophagitis: Secondary | ICD-10-CM | POA: Insufficient documentation

## 2021-04-20 DIAGNOSIS — Z87891 Personal history of nicotine dependence: Secondary | ICD-10-CM | POA: Diagnosis not present

## 2021-04-20 DIAGNOSIS — R1031 Right lower quadrant pain: Secondary | ICD-10-CM | POA: Insufficient documentation

## 2021-04-20 DIAGNOSIS — Z96653 Presence of artificial knee joint, bilateral: Secondary | ICD-10-CM | POA: Diagnosis not present

## 2021-04-20 DIAGNOSIS — R109 Unspecified abdominal pain: Secondary | ICD-10-CM | POA: Diagnosis not present

## 2021-04-20 DIAGNOSIS — Z96612 Presence of left artificial shoulder joint: Secondary | ICD-10-CM | POA: Insufficient documentation

## 2021-04-20 LAB — URINALYSIS, ROUTINE W REFLEX MICROSCOPIC
Bilirubin Urine: NEGATIVE
Glucose, UA: NEGATIVE mg/dL
Hgb urine dipstick: NEGATIVE
Ketones, ur: NEGATIVE mg/dL
Leukocytes,Ua: NEGATIVE
Nitrite: NEGATIVE
Protein, ur: NEGATIVE mg/dL
Specific Gravity, Urine: 1.011 (ref 1.005–1.030)
pH: 7 (ref 5.0–8.0)

## 2021-04-20 LAB — BASIC METABOLIC PANEL
Anion gap: 11 (ref 5–15)
BUN: 9 mg/dL (ref 8–23)
CO2: 26 mmol/L (ref 22–32)
Calcium: 10.2 mg/dL (ref 8.9–10.3)
Chloride: 97 mmol/L — ABNORMAL LOW (ref 98–111)
Creatinine, Ser: 0.65 mg/dL (ref 0.44–1.00)
GFR, Estimated: >60 mL/min (ref >60)
Glucose, Bld: 113 mg/dL — ABNORMAL HIGH (ref 70–99)
Potassium: 4 mmol/L (ref 3.5–5.1)
Sodium: 134 mmol/L — ABNORMAL LOW (ref 135–145)

## 2021-04-20 LAB — CBC WITH DIFFERENTIAL/PLATELET
Abs Immature Granulocytes: 0.03 10*3/uL (ref 0.00–0.07)
Basophils Absolute: 0.1 10*3/uL (ref 0.0–0.1)
Basophils Relative: 1 %
Eosinophils Absolute: 0.1 10*3/uL (ref 0.0–0.5)
Eosinophils Relative: 2 %
HCT: 42.2 % (ref 36.0–46.0)
Hemoglobin: 14.5 g/dL (ref 12.0–15.0)
Immature Granulocytes: 0 %
Lymphocytes Relative: 32 %
Lymphs Abs: 2.2 10*3/uL (ref 0.7–4.0)
MCH: 34.4 pg — ABNORMAL HIGH (ref 26.0–34.0)
MCHC: 34.4 g/dL (ref 30.0–36.0)
MCV: 100.2 fL — ABNORMAL HIGH (ref 80.0–100.0)
Monocytes Absolute: 0.6 10*3/uL (ref 0.1–1.0)
Monocytes Relative: 9 %
Neutro Abs: 3.9 10*3/uL (ref 1.7–7.7)
Neutrophils Relative %: 56 %
Platelets: 163 10*3/uL (ref 150–400)
RBC: 4.21 MIL/uL (ref 3.87–5.11)
RDW: 13.6 % (ref 11.5–15.5)
WBC: 6.8 10*3/uL (ref 4.0–10.5)
nRBC: 0 % (ref 0.0–0.2)

## 2021-04-20 MED ORDER — HYDROMORPHONE HCL 1 MG/ML IJ SOLN
1.0000 mg | Freq: Once | INTRAMUSCULAR | Status: AC
  Administered 2021-04-20: 1 mg via INTRAVENOUS
  Filled 2021-04-20: qty 1

## 2021-04-20 MED ORDER — SENNOSIDES-DOCUSATE SODIUM 8.6-50 MG PO TABS: 1.0000 | ORAL_TABLET | Freq: Every day | ORAL | 0 refills | Status: AC

## 2021-04-20 MED ORDER — IOHEXOL 350 MG/ML SOLN
60.0000 mL | Freq: Once | INTRAVENOUS | Status: AC | PRN
  Administered 2021-04-20: 60 mL via INTRAVENOUS

## 2021-04-20 MED ORDER — HYDROMORPHONE HCL 2 MG PO TABS: 2.0000 mg | ORAL_TABLET | Freq: Four times a day (QID) | ORAL | 0 refills | Status: DC | PRN

## 2021-04-20 NOTE — ED Provider Notes (Signed)
Patient signed out to me is pending CT imaging.  This is better radiologist as no acute findings other than some nodularity of the contour of the liver.  Patient informed of this finding, advised follow-up with her doctor regarding this within the week.  Advising immediate return for worsening pain fevers or any additional concerns.    Luna Fuse, MD 04/20/21 [phone]

## 2021-04-20 NOTE — ED Triage Notes (Signed)
EMS reports from home, c/o right side abdominal pain since last night. Denies N/V/D. Took Hydrocodone this morning @ 0400 without relief.  BP 164/88 HR 90 RR 20 Sp02 95 RA CBG 123 Temp 98.2  20ga RAC

## 2021-04-20 NOTE — ED Notes (Signed)
Pt placed on purewick 

## 2021-04-20 NOTE — ED Notes (Signed)
Pt informed the need for urine specimen.

## 2021-04-20 NOTE — Discharge Instructions (Addendum)
Your CT scan shows some slight nodular changes to your liver.  He will need to follow-up with your primary care doctor regarding this finding in the next 1 to 2 weeks. You have a small fracture or broken bone in your lumbar spine.  This can cause pain in your hip as well.  This will gradually heal over the next 4 to 6 weeks.  I did prescribe a stronger pain pill called Dilaudid.  You should not take this at the same time as oxycodone.  Both of these are narcotics, can make you dizzy.  This is intended to replace the oxycodone if it is not enough.  Please be aware this medicine will make you dizzy.  It could raise your risk of falling.  Try to take it as sparingly as possible  Please also be aware that these medicines can make you constipated.  I prescribed you a stool softener and a laxative that you can take to help with this - as needed.

## 2021-04-20 NOTE — ED Provider Notes (Signed)
[facility] DEPT Provider Note   CSN: [id number] Arrival date & time: 04/20/21  3825     History Chief Complaint  Patient presents with   Abdominal Pain    Elizabeth Stone is a 75 y.o. female presenting emergency department complaint of right lower quadrant abdominal pain.  She reports gradual onset of pain yesterday after dinnertime.  It is located right lower quadrant.  It is a sharp and stabbing pain.  It is constant but at times flares up.  She has never had it before.  She does report that she separately has lower back pain.  Her pain is worse with sitting up and movement.  She does endorse nausea, denies vomiting, denies diarrhea, reports had a regular bowel movement yesterday.  She does not feel constipated.  She is concerned about appendicitis.  She took her home dose of Norco 10 mg which she uses as needed for pain, says it did not help her.  Her PCP ordered a CT scan of the abdomen 3 days ago but she was unaware of the results.  Per medical record review, this showed a normal appendix, stool in the colon, and also a possible L2 minor compression fracture.  She denies any recent falls or trauma.  She was unaware of this fracture.  HPI     Past Medical History:  Diagnosis Date   Acute blood loss anemia    hx   Anxiety    Arthritis    Balance problem    Bipolar disorder ([place])    Cochlear implant in place    Cochlear implant in place    right ear   Depression    Dermatitis    on face   Dyspnea    Fibromyalgia    GERD (gastroesophageal reflux disease)    Hard of hearing    Hearing aid worn    left ear   Hearing impaired    has cochlear implant   HTN (hypertension)    Hypokalemia    Meniere disease    takes HCTZ for her inner ear problem   Mixed hyperlipidemia 09/27/2013   Osteoporosis    Pneumonia 11/2016   Pre-diabetes    Primary localized osteoarthritis of left knee 11/16/2018   Primary localized osteoarthritis of right knee     Slow transit constipation    Unsteady gait     Patient Active Problem List   Diagnosis Date Noted   History of cochlear implant 11/25/2020   Gait abnormality 10/07/2020   Bilateral low back pain with bilateral sciatica 10/07/2020   Dyspnea on exertion 04/30/2020   Hallux rigidus of right foot 12/24/2019   Hammer toes of both feet 12/24/2019   Primary localized osteoarthritis of left knee 11/16/2018   S/P reverse total shoulder arthroplasty, left 01/07/2017   Bipolar I disorder with depression, severe ([place]) 10/26/2016   Substance use disorder 10/25/2016   Severe episode of recurrent major depressive disorder, without psychotic features ([place])    Impacted cerumen of left ear 03/23/2016   Primary localized osteoarthritis of right knee    Rotator cuff tear arthropathy 12/13/2014   Ulcer of right second toe ([place]) 12/21/2013   Family history of ischemic heart disease 09/27/2013   Essential hypertension, benign 09/27/2013   Mixed hyperlipidemia 09/27/2013   Arthritis    Anxiety    Depression    GERD (gastroesophageal reflux disease)    Meniere disease    Osteoporosis    Hearing impaired     Past  Surgical History:  Procedure Laterality Date   ABDOMINAL HYSTERECTOMY  1986   bladder tack  2007   COCHLEAR IMPLANT     2007   COLONOSCOPY     COLONOSCOPY     HAMMER TOE SURGERY  2014   left foot   REVERSE SHOULDER ARTHROPLASTY Right 12/13/2014   dr Tamera Punt   REVERSE SHOULDER ARTHROPLASTY Right 12/13/2014   Procedure: REVERSE SHOULDER ARTHROPLASTY rt;  Surgeon: Nita Sells, MD;  Location: [facility];  Service: Orthopedics;  Laterality: Right;  Right reverse total shoulder replacement   REVERSE SHOULDER ARTHROPLASTY Left 01/07/2017   Procedure: REVERSE LEFT TOTAL SHOULDER ARTHROPLASTY;  Surgeon: Tania Ade, MD;  Location: [facility];  Service: Orthopedics;  Laterality: Left;  Left reveres total shoulder arthroplasty   TOTAL KNEE ARTHROPLASTY Right 12/16/2015   Procedure: TOTAL  KNEE ARTHROPLASTY;  Surgeon: Elsie Saas, MD;  Location: [facility];  Service: Orthopedics;  Laterality: Right;   TOTAL KNEE ARTHROPLASTY Left 11/28/2018   Procedure: LEFT TOTAL KNEE ARTHROPLASTY;  Surgeon: Elsie Saas, MD;  Location: [facility];  Service: Orthopedics;  Laterality: Left;     OB History   No obstetric history on file.     Family History  Problem Relation Age of Onset   Heart disease Mother    Breast cancer Mother    Cervical cancer Mother    Diabetes Mother    Heart disease Father    Diabetes Maternal Aunt    Colon cancer Neg Hx    Esophageal cancer Neg Hx    Rectal cancer Neg Hx    Stomach cancer Neg Hx    Liver cancer Neg Hx    Pancreatic cancer Neg Hx     Social History   Tobacco Use   Smoking status: Former    Packs/day: 0.25    Years: 23.00    Pack years: 5.75    Types: Cigarettes    Quit date: 08/25/1993    Years since quitting: 27.6   Smokeless tobacco: Never  Vaping Use   Vaping Use: Never used  Substance Use Topics   Alcohol use: Yes    Comment: 2 glasses of wine per night   Drug use: No    Home Medications Prior to Admission medications   Medication Sig Start Date End Date Taking? Authorizing Provider  acetaminophen (TYLENOL) 500 MG tablet Take 1,000 mg by mouth every 6 (six) hours as needed for mild pain (for pain.).     [provider]  albuterol (VENTOLIN HFA) 108 (90 Base) MCG/ACT inhaler Inhale 2 puffs into the lungs every 6 (six) hours as needed for wheezing or shortness of breath. 06/13/20   Byrum, Rose Fillers, MD  Ascorbic Acid (VITAMIN C PO) Take 500 mg by mouth daily.    [provider]  Asenapine Maleate 10 MG SUBL Place 10 mg under the tongue at bedtime.    [provider]  aspirin 81 MG chewable tablet Chew 81 mg by mouth daily.    [provider]  atorvastatin (LIPITOR) 10 MG tablet Take 10 mg by mouth at bedtime.     [provider]  buPROPion (WELLBUTRIN XL) 300 MG 24 hr tablet Take 300  mg by mouth daily.    [provider]  clonazePAM (KLONOPIN) 0.5 MG tablet Take 1 mg by mouth at bedtime.    [provider]  COVID-19 mRNA vaccine, Pfizer, 30 MCG/0.3ML injection USE AS DIRECTED 09/18/20 09/18/21  Carlyle Basques, MD  cyclobenzaprine (FLEXERIL) 10 MG tablet  Take 1 tablet (10 mg total) by mouth 2 (two) times daily as needed for muscle spasms. 12/11/20   Gareth Morgan, MD  DULoxetine (CYMBALTA) 60 MG capsule Take 60 mg by mouth 2 (two) times daily.     [provider]  hydrochlorothiazide (HYDRODIURIL) 25 MG tablet Take 25 mg by mouth Daily.  08/05/12   [provider]  HYDROcodone-acetaminophen (NORCO) 10-325 MG tablet Take 1 tablet by mouth every 12 (twelve) hours as needed for severe pain.    [provider]  ketorolac (ACULAR) 0.5 % ophthalmic solution Place 1 drop into the right eye 4 (four) times daily.    [provider]  KLOR-CON M20 20 MEQ tablet Take 20 mEq by mouth daily.  07/18/12   [provider]  lamoTRIgine (LAMICTAL) 150 MG tablet Take 300 mg by mouth daily.    [provider]  lidocaine (LIDODERM) 5 % Place 1 patch onto the skin daily. Remove & Discard patch within 12 hours or as directed by MD 12/11/20   Gareth Morgan, MD  meloxicam (MOBIC) 15 MG tablet Take 15 mg by mouth daily.    [provider]  metFORMIN (GLUCOPHAGE-XR) 750 MG 24 hr tablet Take 750 mg by mouth daily with breakfast.    [provider]  montelukast (SINGULAIR) 10 MG tablet Take 10 mg by mouth at bedtime. 07/15/12   [provider]  prednisoLONE acetate (PRED FORTE) 1 % ophthalmic suspension Place 1 drop into the right eye 3 (three) times daily.    [provider]  VITAMIN D PO Take 1,000 Units by mouth daily.    [provider]    Allergies    Adhesive [tape]  Review of Systems   Review of Systems  Constitutional:  Negative for chills and fever.  Eyes:  Negative for pain  and visual disturbance.  Respiratory:  Negative for cough and shortness of breath.   Cardiovascular:  Negative for chest pain and palpitations.  Gastrointestinal:  Positive for abdominal pain and nausea. Negative for constipation and vomiting.  Genitourinary:  Negative for dysuria and hematuria.  Musculoskeletal:  Positive for back pain. Negative for myalgias.  Skin:  Negative for color change and rash.  Neurological:  Negative for syncope and light-headedness.  All other systems reviewed and are negative.  Physical Exam Updated Vital Signs BP (!) 148/64   Pulse 94   Temp 98 F (36.7 C) (Oral)   Resp 18   LMP  (LMP Unknown)   SpO2 96%   Physical Exam Constitutional:      General: She is not in acute distress. HENT:     Head: Normocephalic and atraumatic.  Eyes:     Conjunctiva/sclera: Conjunctivae normal.     Pupils: Pupils are equal, round, and reactive to light.  Cardiovascular:     Rate and Rhythm: Normal rate and regular rhythm.  Pulmonary:     Effort: Pulmonary effort is normal. No respiratory distress.  Abdominal:     General: There is no distension.     Tenderness: There is abdominal tenderness in the right lower quadrant and suprapubic area. There is no guarding or rebound. Positive signs include McBurney's sign. Negative signs include Murphy's sign.  Musculoskeletal:     Comments: Right lower paraspinal ttp and midline ttp  Skin:    General: Skin is warm and dry.  Neurological:     General: No focal deficit present.     Mental Status: She is alert. Mental status is at baseline.  Motor: No weakness.  Psychiatric:        Mood and Affect: Mood normal.        Behavior: Behavior normal.     ED Results / Procedures / Treatments   Labs (all labs ordered are listed, but only abnormal results are displayed) Labs Reviewed - No data to display  EKG None  Radiology No results found.  Procedures Procedures   Medications Ordered in ED Medications - No  data to display  ED Course  I have reviewed the triage vital signs and the nursing notes.  Pertinent labs & imaging results that were available during my care of the patient were reviewed by me and considered in my medical decision making (see chart for details).    This patient complains of  RLQ abd pain and back pain. This involves an extensive number of treatment options, and is a complaint that carries with it a high risk of complications and morbidity.  The differential diagnosis includes referred pain from lumbar spine vs appendicitis vs UTI vs constipation vs other  I ordered, reviewed, and interpreted labs, showing no evident UTI, BMP and CBC unremarkable.  No leukocytosis. I ordered medication for pain I ordered imaging studies which included CT abdomen pelvis with contrast - pending at the time of signout to EDP Previous records obtained and reviewed showing prior CT on 04/17/21 with L2 endplate fx   Clinical Course as of 04/20/21 1646  Sun Apr 20, 2021  1554 Patient is pending CT scan of the abdomen because she continues to complain of focal right lower quadrant tenderness and has this on exam.  We will need to rule out appendicitis.  If this is negative, anticipate she can be discharged home with pain medications, suspect that her pain may be coming from her lumbar fracture that was seen 3 days ago.  Pt signed out to Dr Almyra Free EDP to follow up on this scan.  Patient updated regarding plan and in agreement. [MT]    Clinical Course User Index [MT] Treyton Slimp, Carola Rhine, MD    Final Clinical Impression(s) / ED Diagnoses Final diagnoses:  None    Rx / DC Orders ED Discharge Orders     None        Langston Masker Carola Rhine, MD 04/20/21 [phone]

## 2021-05-13 DIAGNOSIS — K5900 Constipation, unspecified: Secondary | ICD-10-CM | POA: Diagnosis not present

## 2021-05-13 DIAGNOSIS — M25552 Pain in left hip: Secondary | ICD-10-CM | POA: Diagnosis not present

## 2021-05-13 DIAGNOSIS — G8929 Other chronic pain: Secondary | ICD-10-CM | POA: Diagnosis not present

## 2021-05-15 DIAGNOSIS — M47816 Spondylosis without myelopathy or radiculopathy, lumbar region: Secondary | ICD-10-CM | POA: Diagnosis not present

## 2021-05-19 DIAGNOSIS — M545 Low back pain, unspecified: Secondary | ICD-10-CM | POA: Diagnosis not present

## 2021-05-22 ENCOUNTER — Other Ambulatory Visit: Payer: Self-pay | Admitting: Orthopedic Surgery

## 2021-05-22 DIAGNOSIS — G8929 Other chronic pain: Secondary | ICD-10-CM

## 2021-05-22 DIAGNOSIS — M545 Low back pain, unspecified: Secondary | ICD-10-CM

## 2021-05-29 ENCOUNTER — Other Ambulatory Visit: Payer: Self-pay | Admitting: Orthopedic Surgery

## 2021-05-29 DIAGNOSIS — M545 Low back pain, unspecified: Secondary | ICD-10-CM

## 2021-05-29 DIAGNOSIS — G8929 Other chronic pain: Secondary | ICD-10-CM

## 2021-05-30 DIAGNOSIS — R0781 Pleurodynia: Secondary | ICD-10-CM | POA: Diagnosis not present

## 2021-05-30 DIAGNOSIS — I10 Essential (primary) hypertension: Secondary | ICD-10-CM | POA: Diagnosis not present

## 2021-05-30 DIAGNOSIS — M545 Low back pain, unspecified: Secondary | ICD-10-CM | POA: Diagnosis not present

## 2021-06-03 ENCOUNTER — Other Ambulatory Visit: Payer: Self-pay

## 2021-06-03 ENCOUNTER — Other Ambulatory Visit: Payer: Self-pay | Admitting: Orthopedic Surgery

## 2021-06-03 DIAGNOSIS — M545 Low back pain, unspecified: Secondary | ICD-10-CM

## 2021-06-06 ENCOUNTER — Other Ambulatory Visit: Payer: Self-pay

## 2021-06-06 ENCOUNTER — Encounter (HOSPITAL_COMMUNITY)
Admission: RE | Admit: 2021-06-06 | Discharge: 2021-06-06 | Disposition: A | Discharge status: Home / Self Care | Payer: Medicare Other | Source: Ambulatory Visit | Attending: Orthopedic Surgery | Admitting: Orthopedic Surgery

## 2021-06-06 DIAGNOSIS — S32020A Wedge compression fracture of second lumbar vertebra, initial encounter for closed fracture: Secondary | ICD-10-CM | POA: Diagnosis not present

## 2021-06-06 DIAGNOSIS — M545 Low back pain, unspecified: Secondary | ICD-10-CM | POA: Diagnosis not present

## 2021-06-06 DIAGNOSIS — R296 Repeated falls: Secondary | ICD-10-CM | POA: Diagnosis not present

## 2021-06-06 DIAGNOSIS — M47816 Spondylosis without myelopathy or radiculopathy, lumbar region: Secondary | ICD-10-CM | POA: Diagnosis not present

## 2021-06-06 DIAGNOSIS — M47814 Spondylosis without myelopathy or radiculopathy, thoracic region: Secondary | ICD-10-CM | POA: Diagnosis not present

## 2021-06-06 MED ORDER — TECHNETIUM TC 99M MEDRONATE IV KIT
20.0000 | PACK | Freq: Once | INTRAVENOUS | Status: AC | PRN
  Administered 2021-06-06: 21.6 via INTRAVENOUS

## 2021-06-09 ENCOUNTER — Other Ambulatory Visit: Payer: Self-pay

## 2021-06-09 ENCOUNTER — Ambulatory Visit
Admission: RE | Admit: 2021-06-09 | Discharge: 2021-06-09 | Disposition: A | Discharge status: Home / Self Care | Payer: Medicare Other | Source: Ambulatory Visit | Attending: Orthopedic Surgery | Admitting: Orthopedic Surgery

## 2021-06-09 DIAGNOSIS — M2578 Osteophyte, vertebrae: Secondary | ICD-10-CM | POA: Diagnosis not present

## 2021-06-09 DIAGNOSIS — G8929 Other chronic pain: Secondary | ICD-10-CM

## 2021-06-09 DIAGNOSIS — M4856XA Collapsed vertebra, not elsewhere classified, lumbar region, initial encounter for fracture: Secondary | ICD-10-CM | POA: Diagnosis not present

## 2021-06-09 DIAGNOSIS — M5136 Other intervertebral disc degeneration, lumbar region: Secondary | ICD-10-CM | POA: Diagnosis not present

## 2021-06-09 DIAGNOSIS — M545 Low back pain, unspecified: Secondary | ICD-10-CM

## 2021-06-09 DIAGNOSIS — M5126 Other intervertebral disc displacement, lumbar region: Secondary | ICD-10-CM | POA: Diagnosis not present

## 2021-06-09 MED ORDER — IOPAMIDOL (ISOVUE-M 200) INJECTION 41%
20.0000 mL | Freq: Once | INTRAMUSCULAR | Status: AC
  Administered 2021-06-09: 20 mL via INTRATHECAL

## 2021-06-09 MED ORDER — DIAZEPAM 5 MG PO TABS
5.0000 mg | ORAL_TABLET | Freq: Once | ORAL | Status: AC
  Administered 2021-06-09: 5 mg via ORAL

## 2021-06-09 MED ORDER — ONDANSETRON HCL 4 MG/2ML IJ SOLN
4.0000 mg | Freq: Once | INTRAMUSCULAR | Status: AC | PRN
  Administered 2021-06-09: 4 mg via INTRAMUSCULAR

## 2021-06-09 MED ORDER — MEPERIDINE HCL 50 MG/ML IJ SOLN
50.0000 mg | Freq: Once | INTRAMUSCULAR | Status: AC | PRN
  Administered 2021-06-09: 50 mg via INTRAMUSCULAR

## 2021-06-09 NOTE — Discharge Instr - Other Orders (Addendum)
1340: pt reports pain 10/10 from myelogram procedure. See MAR.  [id number]: pt reports her pain is "better" no longer at a 10. Relaxing in nursing recovery area until d/c

## 2021-06-09 NOTE — Discharge Instructions (Signed)

## 2021-06-13 DIAGNOSIS — M5416 Radiculopathy, lumbar region: Secondary | ICD-10-CM | POA: Diagnosis not present

## 2021-06-19 DIAGNOSIS — M48061 Spinal stenosis, lumbar region without neurogenic claudication: Secondary | ICD-10-CM | POA: Diagnosis not present

## 2021-06-19 DIAGNOSIS — M5416 Radiculopathy, lumbar region: Secondary | ICD-10-CM | POA: Diagnosis not present

## 2021-06-25 DIAGNOSIS — H903 Sensorineural hearing loss, bilateral: Secondary | ICD-10-CM | POA: Diagnosis not present

## 2021-06-25 DIAGNOSIS — Z45328 Encounter for adjustment and management of other implanted hearing device: Secondary | ICD-10-CM | POA: Diagnosis not present

## 2021-06-25 DIAGNOSIS — Z9621 Cochlear implant status: Secondary | ICD-10-CM | POA: Diagnosis not present

## 2021-06-30 DIAGNOSIS — M5416 Radiculopathy, lumbar region: Secondary | ICD-10-CM | POA: Diagnosis not present

## 2021-07-08 DIAGNOSIS — G44319 Acute post-traumatic headache, not intractable: Secondary | ICD-10-CM | POA: Diagnosis not present

## 2021-07-08 DIAGNOSIS — Z23 Encounter for immunization: Secondary | ICD-10-CM | POA: Diagnosis not present

## 2021-07-08 DIAGNOSIS — G894 Chronic pain syndrome: Secondary | ICD-10-CM | POA: Diagnosis not present

## 2021-07-10 DIAGNOSIS — G44309 Post-traumatic headache, unspecified, not intractable: Secondary | ICD-10-CM | POA: Diagnosis not present

## 2021-07-10 DIAGNOSIS — M47812 Spondylosis without myelopathy or radiculopathy, cervical region: Secondary | ICD-10-CM | POA: Diagnosis not present

## 2021-07-10 DIAGNOSIS — S0990XA Unspecified injury of head, initial encounter: Secondary | ICD-10-CM | POA: Diagnosis not present

## 2021-07-16 DIAGNOSIS — F3181 Bipolar II disorder: Secondary | ICD-10-CM | POA: Diagnosis not present

## 2021-08-12 DIAGNOSIS — M47816 Spondylosis without myelopathy or radiculopathy, lumbar region: Secondary | ICD-10-CM | POA: Diagnosis not present

## 2021-08-12 DIAGNOSIS — M48061 Spinal stenosis, lumbar region without neurogenic claudication: Secondary | ICD-10-CM | POA: Diagnosis not present

## 2021-08-15 DIAGNOSIS — R0781 Pleurodynia: Secondary | ICD-10-CM | POA: Diagnosis not present

## 2021-08-28 DIAGNOSIS — L821 Other seborrheic keratosis: Secondary | ICD-10-CM | POA: Diagnosis not present

## 2021-08-28 DIAGNOSIS — D2230 Melanocytic nevi of unspecified part of face: Secondary | ICD-10-CM | POA: Diagnosis not present

## 2021-08-28 DIAGNOSIS — D225 Melanocytic nevi of trunk: Secondary | ICD-10-CM | POA: Diagnosis not present

## 2021-08-28 DIAGNOSIS — D485 Neoplasm of uncertain behavior of skin: Secondary | ICD-10-CM | POA: Diagnosis not present

## 2021-08-28 DIAGNOSIS — D0471 Carcinoma in situ of skin of right lower limb, including hip: Secondary | ICD-10-CM | POA: Diagnosis not present

## 2021-09-01 DIAGNOSIS — R0781 Pleurodynia: Secondary | ICD-10-CM | POA: Diagnosis not present

## 2021-09-23 DIAGNOSIS — D0471 Carcinoma in situ of skin of right lower limb, including hip: Secondary | ICD-10-CM | POA: Diagnosis not present

## 2021-09-25 DIAGNOSIS — E113392 Type 2 diabetes mellitus with moderate nonproliferative diabetic retinopathy without macular edema, left eye: Secondary | ICD-10-CM | POA: Diagnosis not present

## 2021-09-25 DIAGNOSIS — I700 Atherosclerosis of aorta: Secondary | ICD-10-CM | POA: Diagnosis not present

## 2021-09-25 DIAGNOSIS — E782 Mixed hyperlipidemia: Secondary | ICD-10-CM | POA: Diagnosis not present

## 2021-09-25 DIAGNOSIS — M545 Low back pain, unspecified: Secondary | ICD-10-CM | POA: Diagnosis not present

## 2021-09-29 DIAGNOSIS — R0781 Pleurodynia: Secondary | ICD-10-CM | POA: Diagnosis not present

## 2021-10-15 DIAGNOSIS — Z7984 Long term (current) use of oral hypoglycemic drugs: Secondary | ICD-10-CM | POA: Diagnosis not present

## 2021-10-15 DIAGNOSIS — E1169 Type 2 diabetes mellitus with other specified complication: Secondary | ICD-10-CM | POA: Diagnosis not present

## 2021-10-15 DIAGNOSIS — I10 Essential (primary) hypertension: Secondary | ICD-10-CM | POA: Diagnosis not present

## 2021-10-22 ENCOUNTER — Other Ambulatory Visit: Payer: Self-pay | Admitting: Podiatry

## 2021-10-22 ENCOUNTER — Other Ambulatory Visit: Payer: Self-pay

## 2021-10-22 ENCOUNTER — Ambulatory Visit (INDEPENDENT_AMBULATORY_CARE_PROVIDER_SITE_OTHER): Payer: Medicare Other

## 2021-10-22 ENCOUNTER — Encounter: Payer: Self-pay | Admitting: Podiatry

## 2021-10-22 ENCOUNTER — Ambulatory Visit: Payer: Medicare Other | Admitting: Podiatry

## 2021-10-22 DIAGNOSIS — M2042 Other hammer toe(s) (acquired), left foot: Secondary | ICD-10-CM | POA: Diagnosis not present

## 2021-10-22 DIAGNOSIS — L97529 Non-pressure chronic ulcer of other part of left foot with unspecified severity: Secondary | ICD-10-CM

## 2021-10-22 DIAGNOSIS — M21612 Bunion of left foot: Secondary | ICD-10-CM | POA: Diagnosis not present

## 2021-10-22 DIAGNOSIS — M21619 Bunion of unspecified foot: Secondary | ICD-10-CM

## 2021-10-22 DIAGNOSIS — M2041 Other hammer toe(s) (acquired), right foot: Secondary | ICD-10-CM | POA: Diagnosis not present

## 2021-10-22 NOTE — Progress Notes (Signed)
Subjective:   Patient ID: Elizabeth Stone, female   DOB: 76 y.o.   MRN: [id number]   HPI Patient presents concerned about a sore between the left toe and the second toe and states that she has been using Neosporin it seems better but she wants it checked due to having diabetes   ROS      Objective:  Physical Exam  Neurovascular status unchanged good circulatory status noted with patient found to have a irritation in the left hallux lateral side of the inner phalangeal joint localized no active drainage no erythema edema      Assessment:  Digital deformity with pressure between the hallux and second toe creating reactivity of tissue with no indication of ulceration      Plan:  H&P condition reviewed and discussed.  I do not recommend surgery currently and I discussed the possibility in the future for digital surgery if this becomes worse but at this point I just applied digital sleeve to take pressure off the toe along with wider shoes and will be seen back as needed

## 2021-11-05 DIAGNOSIS — E113293 Type 2 diabetes mellitus with mild nonproliferative diabetic retinopathy without macular edema, bilateral: Secondary | ICD-10-CM | POA: Diagnosis not present

## 2021-11-05 DIAGNOSIS — H02834 Dermatochalasis of left upper eyelid: Secondary | ICD-10-CM | POA: Diagnosis not present

## 2021-11-05 DIAGNOSIS — H02831 Dermatochalasis of right upper eyelid: Secondary | ICD-10-CM | POA: Diagnosis not present

## 2021-11-05 DIAGNOSIS — H40013 Open angle with borderline findings, low risk, bilateral: Secondary | ICD-10-CM | POA: Diagnosis not present

## 2021-11-11 DIAGNOSIS — L710 Perioral dermatitis: Secondary | ICD-10-CM | POA: Diagnosis not present

## 2021-11-12 DIAGNOSIS — I10 Essential (primary) hypertension: Secondary | ICD-10-CM | POA: Diagnosis not present

## 2021-12-23 DIAGNOSIS — E782 Mixed hyperlipidemia: Secondary | ICD-10-CM | POA: Diagnosis not present

## 2021-12-23 DIAGNOSIS — G894 Chronic pain syndrome: Secondary | ICD-10-CM | POA: Diagnosis not present

## 2021-12-23 DIAGNOSIS — I10 Essential (primary) hypertension: Secondary | ICD-10-CM | POA: Diagnosis not present

## 2022-01-29 DIAGNOSIS — L719 Rosacea, unspecified: Secondary | ICD-10-CM | POA: Diagnosis not present

## 2022-01-29 DIAGNOSIS — L578 Other skin changes due to chronic exposure to nonionizing radiation: Secondary | ICD-10-CM | POA: Diagnosis not present

## 2022-01-29 DIAGNOSIS — L603 Nail dystrophy: Secondary | ICD-10-CM | POA: Diagnosis not present

## 2022-01-29 DIAGNOSIS — L570 Actinic keratosis: Secondary | ICD-10-CM | POA: Diagnosis not present

## 2022-02-04 DIAGNOSIS — E119 Type 2 diabetes mellitus without complications: Secondary | ICD-10-CM | POA: Diagnosis not present

## 2022-02-11 DIAGNOSIS — M1990 Unspecified osteoarthritis, unspecified site: Secondary | ICD-10-CM | POA: Diagnosis not present

## 2022-02-11 DIAGNOSIS — E119 Type 2 diabetes mellitus without complications: Secondary | ICD-10-CM | POA: Diagnosis not present

## 2022-02-11 DIAGNOSIS — Z6829 Body mass index (BMI) 29.0-29.9, adult: Secondary | ICD-10-CM | POA: Diagnosis not present

## 2022-02-25 DIAGNOSIS — F3181 Bipolar II disorder: Secondary | ICD-10-CM | POA: Diagnosis not present

## 2022-03-25 DIAGNOSIS — Z1231 Encounter for screening mammogram for malignant neoplasm of breast: Secondary | ICD-10-CM | POA: Diagnosis not present

## 2022-04-01 DIAGNOSIS — B351 Tinea unguium: Secondary | ICD-10-CM | POA: Diagnosis not present

## 2022-04-01 DIAGNOSIS — D225 Melanocytic nevi of trunk: Secondary | ICD-10-CM | POA: Diagnosis not present

## 2022-04-01 DIAGNOSIS — L821 Other seborrheic keratosis: Secondary | ICD-10-CM | POA: Diagnosis not present

## 2022-04-01 DIAGNOSIS — Z85828 Personal history of other malignant neoplasm of skin: Secondary | ICD-10-CM | POA: Diagnosis not present

## 2022-04-20 DIAGNOSIS — M48061 Spinal stenosis, lumbar region without neurogenic claudication: Secondary | ICD-10-CM | POA: Diagnosis not present

## 2022-04-20 DIAGNOSIS — M5416 Radiculopathy, lumbar region: Secondary | ICD-10-CM | POA: Diagnosis not present

## 2022-05-01 DIAGNOSIS — M5416 Radiculopathy, lumbar region: Secondary | ICD-10-CM | POA: Diagnosis not present

## 2022-05-13 DIAGNOSIS — Z79899 Other long term (current) drug therapy: Secondary | ICD-10-CM | POA: Diagnosis not present

## 2022-05-13 DIAGNOSIS — M545 Low back pain, unspecified: Secondary | ICD-10-CM | POA: Diagnosis not present

## 2022-05-13 DIAGNOSIS — E782 Mixed hyperlipidemia: Secondary | ICD-10-CM | POA: Diagnosis not present

## 2022-05-13 DIAGNOSIS — I700 Atherosclerosis of aorta: Secondary | ICD-10-CM | POA: Diagnosis not present

## 2022-05-13 DIAGNOSIS — E113392 Type 2 diabetes mellitus with moderate nonproliferative diabetic retinopathy without macular edema, left eye: Secondary | ICD-10-CM | POA: Diagnosis not present

## 2022-06-09 DIAGNOSIS — M5416 Radiculopathy, lumbar region: Secondary | ICD-10-CM | POA: Diagnosis not present

## 2022-06-09 DIAGNOSIS — M48061 Spinal stenosis, lumbar region without neurogenic claudication: Secondary | ICD-10-CM | POA: Diagnosis not present

## 2022-06-24 DIAGNOSIS — M5416 Radiculopathy, lumbar region: Secondary | ICD-10-CM | POA: Diagnosis not present

## 2022-08-04 DIAGNOSIS — M5416 Radiculopathy, lumbar region: Secondary | ICD-10-CM | POA: Diagnosis not present

## 2022-08-04 DIAGNOSIS — M48061 Spinal stenosis, lumbar region without neurogenic claudication: Secondary | ICD-10-CM | POA: Diagnosis not present

## 2022-08-05 DIAGNOSIS — Z45328 Encounter for adjustment and management of other implanted hearing device: Secondary | ICD-10-CM | POA: Diagnosis not present

## 2022-08-05 DIAGNOSIS — Z9621 Cochlear implant status: Secondary | ICD-10-CM | POA: Diagnosis not present

## 2022-08-05 DIAGNOSIS — H903 Sensorineural hearing loss, bilateral: Secondary | ICD-10-CM | POA: Diagnosis not present

## 2022-08-26 DIAGNOSIS — E113392 Type 2 diabetes mellitus with moderate nonproliferative diabetic retinopathy without macular edema, left eye: Secondary | ICD-10-CM | POA: Diagnosis not present

## 2022-08-26 DIAGNOSIS — I700 Atherosclerosis of aorta: Secondary | ICD-10-CM | POA: Diagnosis not present

## 2022-08-26 DIAGNOSIS — E782 Mixed hyperlipidemia: Secondary | ICD-10-CM | POA: Diagnosis not present

## 2022-08-26 DIAGNOSIS — M545 Low back pain, unspecified: Secondary | ICD-10-CM | POA: Diagnosis not present

## 2022-09-02 DIAGNOSIS — M5416 Radiculopathy, lumbar region: Secondary | ICD-10-CM | POA: Diagnosis not present

## 2022-09-08 DIAGNOSIS — E1169 Type 2 diabetes mellitus with other specified complication: Secondary | ICD-10-CM | POA: Diagnosis not present

## 2022-10-07 ENCOUNTER — Emergency Department (HOSPITAL_COMMUNITY): Payer: Medicare Other

## 2022-10-07 ENCOUNTER — Inpatient Hospital Stay (HOSPITAL_COMMUNITY)
Admission: EM | Admit: 2022-10-07 | Discharge: 2022-10-11 | DRG: 062 | Disposition: A | Discharge status: Rehabilitation Facility | Payer: Medicare Other | Attending: Neurology | Admitting: Neurology

## 2022-10-07 ENCOUNTER — Other Ambulatory Visit: Payer: Self-pay

## 2022-10-07 DIAGNOSIS — Z8049 Family history of malignant neoplasm of other genital organs: Secondary | ICD-10-CM | POA: Diagnosis not present

## 2022-10-07 DIAGNOSIS — I6521 Occlusion and stenosis of right carotid artery: Principal | ICD-10-CM | POA: Diagnosis present

## 2022-10-07 DIAGNOSIS — H532 Diplopia: Secondary | ICD-10-CM | POA: Diagnosis present

## 2022-10-07 DIAGNOSIS — M810 Age-related osteoporosis without current pathological fracture: Secondary | ICD-10-CM | POA: Diagnosis present

## 2022-10-07 DIAGNOSIS — Z7984 Long term (current) use of oral hypoglycemic drugs: Secondary | ICD-10-CM

## 2022-10-07 DIAGNOSIS — I6782 Cerebral ischemia: Secondary | ICD-10-CM | POA: Diagnosis not present

## 2022-10-07 DIAGNOSIS — R29703 NIHSS score 3: Secondary | ICD-10-CM | POA: Diagnosis present

## 2022-10-07 DIAGNOSIS — E871 Hypo-osmolality and hyponatremia: Secondary | ICD-10-CM | POA: Diagnosis present

## 2022-10-07 DIAGNOSIS — H8109 Meniere's disease, unspecified ear: Secondary | ICD-10-CM | POA: Diagnosis present

## 2022-10-07 DIAGNOSIS — E782 Mixed hyperlipidemia: Secondary | ICD-10-CM | POA: Diagnosis present

## 2022-10-07 DIAGNOSIS — I10 Essential (primary) hypertension: Secondary | ICD-10-CM | POA: Diagnosis present

## 2022-10-07 DIAGNOSIS — Z91048 Other nonmedicinal substance allergy status: Secondary | ICD-10-CM

## 2022-10-07 DIAGNOSIS — R471 Dysarthria and anarthria: Secondary | ICD-10-CM | POA: Diagnosis present

## 2022-10-07 DIAGNOSIS — R519 Headache, unspecified: Secondary | ICD-10-CM | POA: Diagnosis not present

## 2022-10-07 DIAGNOSIS — K5901 Slow transit constipation: Secondary | ICD-10-CM | POA: Diagnosis present

## 2022-10-07 DIAGNOSIS — I69328 Other speech and language deficits following cerebral infarction: Secondary | ICD-10-CM | POA: Diagnosis not present

## 2022-10-07 DIAGNOSIS — E876 Hypokalemia: Secondary | ICD-10-CM | POA: Diagnosis present

## 2022-10-07 DIAGNOSIS — I639 Cerebral infarction, unspecified: Secondary | ICD-10-CM | POA: Diagnosis present

## 2022-10-07 DIAGNOSIS — Z833 Family history of diabetes mellitus: Secondary | ICD-10-CM

## 2022-10-07 DIAGNOSIS — Z803 Family history of malignant neoplasm of breast: Secondary | ICD-10-CM | POA: Diagnosis not present

## 2022-10-07 DIAGNOSIS — R2990 Unspecified symptoms and signs involving the nervous system: Secondary | ICD-10-CM | POA: Diagnosis not present

## 2022-10-07 DIAGNOSIS — G4489 Other headache syndrome: Secondary | ICD-10-CM | POA: Diagnosis not present

## 2022-10-07 DIAGNOSIS — F319 Bipolar disorder, unspecified: Secondary | ICD-10-CM | POA: Diagnosis present

## 2022-10-07 DIAGNOSIS — H538 Other visual disturbances: Secondary | ICD-10-CM | POA: Diagnosis present

## 2022-10-07 DIAGNOSIS — I6389 Other cerebral infarction: Secondary | ICD-10-CM | POA: Diagnosis not present

## 2022-10-07 DIAGNOSIS — H9190 Unspecified hearing loss, unspecified ear: Secondary | ICD-10-CM | POA: Diagnosis not present

## 2022-10-07 DIAGNOSIS — Z8249 Family history of ischemic heart disease and other diseases of the circulatory system: Secondary | ICD-10-CM | POA: Diagnosis not present

## 2022-10-07 DIAGNOSIS — M170 Bilateral primary osteoarthritis of knee: Secondary | ICD-10-CM | POA: Diagnosis present

## 2022-10-07 DIAGNOSIS — Z791 Long term (current) use of non-steroidal anti-inflammatories (NSAID): Secondary | ICD-10-CM

## 2022-10-07 DIAGNOSIS — Z87891 Personal history of nicotine dependence: Secondary | ICD-10-CM

## 2022-10-07 DIAGNOSIS — M797 Fibromyalgia: Secondary | ICD-10-CM | POA: Diagnosis present

## 2022-10-07 DIAGNOSIS — Z96611 Presence of right artificial shoulder joint: Secondary | ICD-10-CM | POA: Diagnosis not present

## 2022-10-07 DIAGNOSIS — L309 Dermatitis, unspecified: Secondary | ICD-10-CM | POA: Diagnosis not present

## 2022-10-07 DIAGNOSIS — I6523 Occlusion and stenosis of bilateral carotid arteries: Secondary | ICD-10-CM | POA: Diagnosis not present

## 2022-10-07 DIAGNOSIS — Z79899 Other long term (current) drug therapy: Secondary | ICD-10-CM

## 2022-10-07 DIAGNOSIS — K219 Gastro-esophageal reflux disease without esophagitis: Secondary | ICD-10-CM | POA: Diagnosis present

## 2022-10-07 DIAGNOSIS — F0630 Mood disorder due to known physiological condition, unspecified: Secondary | ICD-10-CM | POA: Diagnosis not present

## 2022-10-07 DIAGNOSIS — G8929 Other chronic pain: Secondary | ICD-10-CM | POA: Diagnosis not present

## 2022-10-07 DIAGNOSIS — E669 Obesity, unspecified: Secondary | ICD-10-CM | POA: Diagnosis not present

## 2022-10-07 DIAGNOSIS — Z6831 Body mass index (BMI) 31.0-31.9, adult: Secondary | ICD-10-CM | POA: Diagnosis not present

## 2022-10-07 DIAGNOSIS — Z96612 Presence of left artificial shoulder joint: Secondary | ICD-10-CM | POA: Diagnosis not present

## 2022-10-07 DIAGNOSIS — R29818 Other symptoms and signs involving the nervous system: Secondary | ICD-10-CM | POA: Diagnosis not present

## 2022-10-07 DIAGNOSIS — F419 Anxiety disorder, unspecified: Secondary | ICD-10-CM | POA: Diagnosis not present

## 2022-10-07 DIAGNOSIS — R7303 Prediabetes: Secondary | ICD-10-CM | POA: Diagnosis not present

## 2022-10-07 DIAGNOSIS — R5381 Other malaise: Secondary | ICD-10-CM | POA: Diagnosis not present

## 2022-10-07 LAB — COMPREHENSIVE METABOLIC PANEL
ALT: 33 U/L (ref 0–44)
AST: 40 U/L (ref 15–41)
Albumin: 3.9 g/dL (ref 3.5–5.0)
Alkaline Phosphatase: 98 U/L (ref 38–126)
Anion gap: 12 (ref 5–15)
BUN: 13 mg/dL (ref 8–23)
CO2: 23 mmol/L (ref 22–32)
Calcium: 9.9 mg/dL (ref 8.9–10.3)
Chloride: 91 mmol/L — ABNORMAL LOW (ref 98–111)
Creatinine, Ser: 0.77 mg/dL (ref 0.44–1.00)
GFR, Estimated: >60 mL/min (ref >60)
Glucose, Bld: 157 mg/dL — ABNORMAL HIGH (ref 70–99)
Potassium: 3.6 mmol/L (ref 3.5–5.1)
Sodium: 126 mmol/L — ABNORMAL LOW (ref 135–145)
Total Bilirubin: 0.5 mg/dL (ref 0.3–1.2)
Total Protein: 7 g/dL (ref 6.5–8.1)

## 2022-10-07 LAB — I-STAT CHEM 8, ED
BUN: 16 mg/dL (ref 8–23)
Calcium, Ion: 1.2 mmol/L (ref 1.15–1.40)
Chloride: 90 mmol/L — ABNORMAL LOW (ref 98–111)
Creatinine, Ser: 0.6 mg/dL (ref 0.44–1.00)
Glucose, Bld: 157 mg/dL — ABNORMAL HIGH (ref 70–99)
HCT: 41 % (ref 36.0–46.0)
Hemoglobin: 13.9 g/dL (ref 12.0–15.0)
Potassium: 3.8 mmol/L (ref 3.5–5.1)
Sodium: 126 mmol/L — ABNORMAL LOW (ref 135–145)
TCO2: 24 mmol/L (ref 22–32)

## 2022-10-07 LAB — DIFFERENTIAL
Abs Immature Granulocytes: 0.04 10*3/uL (ref 0.00–0.07)
Basophils Absolute: 0.1 10*3/uL (ref 0.0–0.1)
Basophils Relative: 1 %
Eosinophils Absolute: 0.1 10*3/uL (ref 0.0–0.5)
Eosinophils Relative: 1 %
Immature Granulocytes: 1 %
Lymphocytes Relative: 24 %
Lymphs Abs: 1.6 10*3/uL (ref 0.7–4.0)
Monocytes Absolute: 0.7 10*3/uL (ref 0.1–1.0)
Monocytes Relative: 10 %
Neutro Abs: 4.3 10*3/uL (ref 1.7–7.7)
Neutrophils Relative %: 63 %

## 2022-10-07 LAB — CBC
HCT: 38.7 % (ref 36.0–46.0)
Hemoglobin: 13.7 g/dL (ref 12.0–15.0)
MCH: 33.6 pg (ref 26.0–34.0)
MCHC: 35.4 g/dL (ref 30.0–36.0)
MCV: 94.9 fL (ref 80.0–100.0)
Platelets: 158 10*3/uL (ref 150–400)
RBC: 4.08 MIL/uL (ref 3.87–5.11)
RDW: 12.8 % (ref 11.5–15.5)
WBC: 6.8 10*3/uL (ref 4.0–10.5)
nRBC: 0 % (ref 0.0–0.2)

## 2022-10-07 LAB — PROTIME-INR
INR: 1.1 (ref 0.8–1.2)
Prothrombin Time: 14.3 seconds (ref 11.4–15.2)

## 2022-10-07 LAB — GLUCOSE, CAPILLARY: Glucose-Capillary: 93 mg/dL (ref 70–99)

## 2022-10-07 LAB — CBG MONITORING, ED: Glucose-Capillary: 175 mg/dL — ABNORMAL HIGH (ref 70–99)

## 2022-10-07 LAB — APTT: aPTT: 33 seconds (ref 24–36)

## 2022-10-07 LAB — ETHANOL: Alcohol, Ethyl (B): <10 mg/dL (ref <10)

## 2022-10-07 MED ORDER — BUPROPION HCL ER (XL) 150 MG PO TB24
300.0000 mg | ORAL_TABLET | Freq: Every day | ORAL | Status: DC
  Administered 2022-10-07 – 2022-10-11 (×5): 300 mg via ORAL
  Filled 2022-10-07: qty 2
  Filled 2022-10-07: qty 1
  Filled 2022-10-07: qty 2
  Filled 2022-10-07: qty 1
  Filled 2022-10-07: qty 2

## 2022-10-07 MED ORDER — SODIUM CHLORIDE 0.9 % IV SOLN: INTRAVENOUS | Status: DC

## 2022-10-07 MED ORDER — SENNOSIDES-DOCUSATE SODIUM 8.6-50 MG PO TABS
1.0000 | ORAL_TABLET | Freq: Every evening | ORAL | Status: DC | PRN
  Administered 2022-10-09 – 2022-10-10 (×2): 1 via ORAL
  Filled 2022-10-07 (×2): qty 1

## 2022-10-07 MED ORDER — STROKE: EARLY STAGES OF RECOVERY BOOK
Freq: Once | Status: AC
  Filled 2022-10-07: qty 1

## 2022-10-07 MED ORDER — ACETAMINOPHEN 650 MG RE SUPP: 650.0000 mg | RECTAL | Status: DC | PRN

## 2022-10-07 MED ORDER — LAMOTRIGINE 100 MG PO TABS
300.0000 mg | ORAL_TABLET | Freq: Every day | ORAL | Status: DC
  Administered 2022-10-07 – 2022-10-11 (×5): 300 mg via ORAL
  Filled 2022-10-07 (×4): qty 3
  Filled 2022-10-07: qty 2

## 2022-10-07 MED ORDER — CLONAZEPAM 0.5 MG PO TABS
1.0000 mg | ORAL_TABLET | Freq: Every day | ORAL | Status: DC
  Administered 2022-10-07 – 2022-10-08 (×2): 1 mg via ORAL
  Filled 2022-10-07 (×2): qty 2

## 2022-10-07 MED ORDER — HYDROCODONE-ACETAMINOPHEN 10-325 MG PO TABS: 1.0000 | ORAL_TABLET | Freq: Two times a day (BID) | ORAL | Status: DC | PRN

## 2022-10-07 MED ORDER — IOHEXOL 350 MG/ML SOLN
75.0000 mL | Freq: Once | INTRAVENOUS | Status: AC | PRN
  Administered 2022-10-07: 75 mL via INTRAVENOUS

## 2022-10-07 MED ORDER — ATORVASTATIN CALCIUM 10 MG PO TABS
10.0000 mg | ORAL_TABLET | Freq: Every day | ORAL | Status: DC
  Administered 2022-10-07 – 2022-10-10 (×4): 10 mg via ORAL
  Filled 2022-10-07 (×4): qty 1

## 2022-10-07 MED ORDER — CHLORHEXIDINE GLUCONATE CLOTH 2 % EX PADS
6.0000 | MEDICATED_PAD | Freq: Every day | CUTANEOUS | Status: DC
  Administered 2022-10-07 – 2022-10-10 (×4): 6 via TOPICAL

## 2022-10-07 MED ORDER — HYDROMORPHONE HCL 2 MG PO TABS
2.0000 mg | ORAL_TABLET | Freq: Four times a day (QID) | ORAL | Status: DC | PRN
  Administered 2022-10-08 – 2022-10-11 (×6): 2 mg via ORAL
  Filled 2022-10-07 (×6): qty 1

## 2022-10-07 MED ORDER — PANTOPRAZOLE SODIUM 40 MG IV SOLR
40.0000 mg | Freq: Every day | INTRAVENOUS | Status: DC
  Administered 2022-10-07: 40 mg via INTRAVENOUS
  Filled 2022-10-07: qty 10

## 2022-10-07 MED ORDER — ACETAMINOPHEN 500 MG PO TABS: 1000.0000 mg | ORAL_TABLET | Freq: Four times a day (QID) | ORAL | Status: DC | PRN

## 2022-10-07 MED ORDER — TENECTEPLASE FOR STROKE
0.2500 mg/kg | PACK | Freq: Once | INTRAVENOUS | Status: AC
  Administered 2022-10-07: 19 mg via INTRAVENOUS
  Filled 2022-10-07: qty 10

## 2022-10-07 MED ORDER — DULOXETINE HCL 60 MG PO CPEP
60.0000 mg | ORAL_CAPSULE | Freq: Two times a day (BID) | ORAL | Status: DC
  Administered 2022-10-07 – 2022-10-11 (×8): 60 mg via ORAL
  Filled 2022-10-07 (×2): qty 1
  Filled 2022-10-07: qty 2
  Filled 2022-10-07 (×5): qty 1

## 2022-10-07 MED ORDER — ORAL CARE MOUTH RINSE
15.0000 mL | OROMUCOSAL | Status: DC
  Administered 2022-10-08 – 2022-10-11 (×12): 15 mL via OROMUCOSAL

## 2022-10-07 MED ORDER — ORAL CARE MOUTH RINSE: 15.0000 mL | OROMUCOSAL | Status: DC | PRN

## 2022-10-07 MED ORDER — ACETAMINOPHEN 160 MG/5ML PO SOLN: 650.0000 mg | ORAL | Status: DC | PRN

## 2022-10-07 MED ORDER — SODIUM CHLORIDE 0.9% FLUSH
3.0000 mL | Freq: Once | INTRAVENOUS | Status: AC
  Administered 2022-10-07: 3 mL via INTRAVENOUS

## 2022-10-07 MED ORDER — ACETAMINOPHEN 325 MG PO TABS: 650.0000 mg | ORAL_TABLET | ORAL | Status: DC | PRN

## 2022-10-07 NOTE — ED Notes (Signed)
RN gave pt water

## 2022-10-07 NOTE — ED Provider Notes (Signed)
[facility] EMERGENCY DEPARTMENT Provider Note   CSN: [id number] Arrival date & time: 10/07/22  1210  An emergency department physician performed an initial assessment on this suspected stroke patient at 1212.  History  Chief Complaint  Patient presents with   Code Stroke    Elizabeth Stone is a 76 y.o. female.  HPI    76 year old female with past medical history significant for cochlear implant, anxiety, depression, HLD, HTN, Mnire's disease, bipolar disorder who presents to the emergency department with blurred vision/double vision.  She was brought to the emergency department as a code stroke activated by EMS.  The patient's last known well was around 1030 this morning.  On arrival, the patient's airway was intact and the patient was taken emergently to the [facility] for code stroke imaging with neurology bedside for evaluation. Home Medications Prior to Admission medications   Medication Sig Start Date End Date Taking? Authorizing Provider  acetaminophen (TYLENOL) 500 MG tablet Take 1,000 mg by mouth every 6 (six) hours as needed for mild pain (for pain.).     [provider]  albuterol (VENTOLIN HFA) 108 (90 Base) MCG/ACT inhaler Inhale 2 puffs into the lungs every 6 (six) hours as needed for wheezing or shortness of breath. Patient not taking: Reported on 04/20/2021 06/13/20   Collene Gobble, MD  Ascorbic Acid (VITAMIN C PO) Take 500 mg by mouth daily.    [provider]  aspirin 81 MG chewable tablet Chew 81 mg by mouth daily.    [provider]  atorvastatin (LIPITOR) 10 MG tablet Take 10 mg by mouth at bedtime.     [provider]  buPROPion (WELLBUTRIN XL) 300 MG 24 hr tablet Take 300 mg by mouth daily.    [provider]  clonazePAM (KLONOPIN) 1 MG tablet Take 1-2 mg by mouth at bedtime. 01/16/21   [provider]  cyclobenzaprine (FLEXERIL) 10 MG tablet Take 1 tablet (10 mg total) by mouth 2 (two) times  daily as needed for muscle spasms. Patient not taking: No sig reported 12/11/20   Gareth Morgan, MD  DULoxetine (CYMBALTA) 60 MG capsule Take 60 mg by mouth 2 (two) times daily.     [provider]  hydrochlorothiazide (HYDRODIURIL) 25 MG tablet Take 25 mg by mouth Daily.  08/05/12   [provider]  HYDROcodone-acetaminophen (NORCO) 10-325 MG tablet Take 1 tablet by mouth every 12 (twelve) hours as needed for severe pain.    [provider]  HYDROmorphone (DILAUDID) 2 MG tablet Take 1 tablet (2 mg total) by mouth every 6 (six) hours as needed for up to 10 doses for severe pain. 04/20/21   Wyvonnia Dusky, MD  lamoTRIgine (LAMICTAL) 150 MG tablet Take 300 mg by mouth daily.    [provider]  lidocaine (LIDODERM) 5 % Place 1 patch onto the skin daily. Remove & Discard patch within 12 hours or as directed by MD Patient not taking: No sig reported 12/11/20   Gareth Morgan, MD  meloxicam (MOBIC) 15 MG tablet Take 15 mg by mouth daily.    [provider]  metFORMIN (GLUCOPHAGE-XR) 750 MG 24 hr tablet Take 750 mg by mouth daily with breakfast.    [provider]  montelukast (SINGULAIR) 10 MG tablet Take 10 mg by mouth at bedtime. 07/15/12   [provider]  potassium chloride SA (KLOR-CON) 20 MEQ tablet Take 20 mEq by mouth daily.    [provider]  VITAMIN  D PO Take 1,000 Units by mouth daily.    [provider]      Allergies    Adhesive [tape]    Review of Systems   Review of Systems  Unable to perform ROS: Acuity of condition    Physical Exam Updated Vital Signs BP (!) 153/61   Pulse 84   Temp 98.5 F (36.9 C) (Oral)   Resp (!) 28   Wt 76.8 kg   LMP  (LMP Unknown)   SpO2 95%   BMI 31.99 kg/m  Physical Exam Vitals and nursing note reviewed.  Constitutional:      General: She is not in acute distress. HENT:     Head: Normocephalic and atraumatic.  Eyes:     Conjunctiva/sclera: Conjunctivae  normal.     Pupils: Pupils are equal, round, and reactive to light.  Cardiovascular:     Rate and Rhythm: Normal rate and regular rhythm.  Pulmonary:     Effort: Pulmonary effort is normal. No respiratory distress.  Abdominal:     General: There is no distension.     Tenderness: There is no guarding.  Musculoskeletal:        General: No deformity or signs of injury.     Cervical back: Neck supple.  Skin:    Findings: No lesion or rash.  Neurological:     Mental Status: She is alert.     Comments: Deferred to neurology who is examining the patient bedside     ED Results / Procedures / Treatments   Labs (all labs ordered are listed, but only abnormal results are displayed) Labs Reviewed  COMPREHENSIVE METABOLIC PANEL - Abnormal; Notable for the following components:      Result Value   Sodium 126 (*)    Chloride 91 (*)    Glucose, Bld 157 (*)    All other components within normal limits  I-STAT CHEM 8, ED - Abnormal; Notable for the following components:   Sodium 126 (*)    Chloride 90 (*)    Glucose, Bld 157 (*)    All other components within normal limits  CBG MONITORING, ED - Abnormal; Notable for the following components:   Glucose-Capillary 175 (*)    All other components within normal limits  PROTIME-INR  APTT  CBC  DIFFERENTIAL  ETHANOL  HEMOGLOBIN A1C  LIPID PANEL    EKG None  Radiology CT ANGIO HEAD NECK W WO CM (CODE STROKE)  Result Date: 10/07/2022 CLINICAL DATA:  Acute neuro deficit rule out stroke EXAM: CT ANGIOGRAPHY HEAD AND NECK TECHNIQUE: Multidetector CT imaging of the head and neck was performed using the standard protocol during bolus administration of intravenous contrast. Multiplanar CT image reconstructions and MIPs were obtained to evaluate the vascular anatomy. Carotid stenosis measurements (when applicable) are obtained utilizing NASCET criteria, using the distal internal carotid diameter as the denominator. RADIATION DOSE REDUCTION: This  exam was performed according to the departmental dose-optimization program which includes automated exposure control, adjustment of the mA and/or kV according to patient size and/or use of iterative reconstruction technique. CONTRAST:  47m OMNIPAQUE IOHEXOL 350 MG/ML SOLN COMPARISON:  CT head 10/07/2022 FINDINGS: CTA NECK FINDINGS Aortic arch: Atherosclerotic calcification aortic arch, mild. Proximal great vessels patent without stenosis. There is streak artifact obscuring the proximal great vessels and carotid bifurcation bilaterally due left shoulder replacement. Right carotid system: Atherosclerotic calcification right carotid bifurcation. Lumen difficult to accurately measure due to streak artifact and motion. Estimated 55% diameter stenosis proximal right internal  carotid artery. Left carotid system: Atherosclerotic calcification left carotid bifurcation without significant stenosis. Vertebral arteries: Both vertebral arteries patent to the skull base without stenosis. Left vertebral artery dominant. Skeleton: Cervical spondylosis.  No acute skeletal abnormality. Other neck: Negative for mass or adenopathy in the neck. Left shoulder replacement. Upper chest: Lung apices clear bilaterally. Chronic right anterior rib fracture. Review of the MIP images confirms the above findings CTA HEAD FINDINGS Anterior circulation: Mild atherosclerotic calcification in the cavernous carotid bilaterally. Negative for stenosis. Anterior and middle cerebral arteries patent without large vessel occlusion or flow limiting stenosis. Negative for aneurysm. Streak artifact from right cochlear implant. Posterior circulation: Both vertebral arteries patent to the basilar. PICA patent bilaterally. Basilar widely patent. Superior cerebellar and posterior cerebral arteries patent without stenosis or large vessel occlusion. Venous sinuses: Limited venous enhancement.  No focal abnormality Anatomic variants: None Review of the MIP images  confirms the above findings IMPRESSION: 1. Negative for intracranial large vessel occlusion. 2. Atherosclerotic calcification right carotid bifurcation with estimated 55% diameter stenosis proximal right internal carotid artery. 3. Atherosclerotic calcification left carotid bifurcation without significant stenosis. 4. Both vertebral arteries patent to the basilar. 5. Aortic atherosclerosis. Aortic Atherosclerosis (ICD10-I70.0). Electronically Signed   By: Franchot Gallo M.D.   On: 10/07/2022 12:53   CT HEAD CODE STROKE WO CONTRAST  Result Date: 10/07/2022 CLINICAL DATA:  Code stroke.  Acute neuro deficit.  Rule out stroke EXAM: CT HEAD WITHOUT CONTRAST TECHNIQUE: Contiguous axial images were obtained from the base of the skull through the vertex without intravenous contrast. RADIATION DOSE REDUCTION: This exam was performed according to the departmental dose-optimization program which includes automated exposure control, adjustment of the mA and/or kV according to patient size and/or use of iterative reconstruction technique. COMPARISON:  CT head 03/15/2021 FINDINGS: Brain: Cochlear implant on the right causing significant streak artifact. Ventricle size normal. Patchy white matter hypodensity bilaterally is similar to the prior study. Negative for acute infarct, hemorrhage, mass Vascular: Negative for hyperdense vessel Skull: Negative Sinuses/Orbits: Paranasal sinuses clear. Bilateral cataract extraction Other: None ASPECTS ([place] Stroke Program Early CT Score) - Ganglionic level infarction (caudate, lentiform nuclei, internal capsule, insula, M1-M3 cortex): 7 - Supraganglionic infarction (M4-M6 cortex): 3 Total score (0-10 with 10 being normal): 10 IMPRESSION: 1. No acute intracranial abnormality. 2. Aspects is 10. 3. Extensive artifact from cochlear implant on the right. 4. Code stroke imaging results were communicated on 10/07/2022 at 12:27 pm to provider [name] via [facility] text page Electronically  Signed   By: Franchot Gallo M.D.   On: 10/07/2022 12:27    Procedures .Critical Care  Performed by: Regan Lemming, MD Authorized by: Regan Lemming, MD   Critical care provider statement:    Critical care time (minutes):  30   Critical care was necessary to treat or prevent imminent or life-threatening deterioration of the following conditions:  CNS failure or compromise   Critical care was time spent personally by me on the following activities:  Development of treatment plan with patient or surrogate, discussions with consultants, evaluation of patient's response to treatment, examination of patient, ordering and review of laboratory studies, ordering and review of radiographic studies, ordering and performing treatments and interventions, pulse oximetry, re-evaluation of patient's condition and review of old charts   Care discussed with: admitting provider       Medications Ordered in ED Medications   stroke: early stages of recovery book (has no administration in time range)  0.9 %  sodium chloride infusion (  Intravenous New Bag/Given 10/07/22 1336)  acetaminophen (TYLENOL) tablet 650 mg (has no administration in time range)    Or  acetaminophen (TYLENOL) 160 MG/5ML solution 650 mg (has no administration in time range)    Or  acetaminophen (TYLENOL) suppository 650 mg (has no administration in time range)  senna-docusate (Senokot-S) tablet 1 tablet (has no administration in time range)  pantoprazole (PROTONIX) injection 40 mg (has no administration in time range)  atorvastatin (LIPITOR) tablet 10 mg (has no administration in time range)  buPROPion (WELLBUTRIN XL) 24 hr tablet 300 mg (300 mg Oral Given 10/07/22 1721)  clonazePAM (KLONOPIN) tablet 1-2 mg (has no administration in time range)  DULoxetine (CYMBALTA) DR capsule 60 mg (has no administration in time range)  HYDROcodone-acetaminophen (NORCO) 10-325 MG per tablet 1 tablet (has no administration in time range)   HYDROmorphone (DILAUDID) tablet 2 mg (has no administration in time range)  lamoTRIgine (LAMICTAL) tablet 300 mg (has no administration in time range)  sodium chloride flush (NS) 0.9 % injection 3 mL (3 mLs Intravenous Given 10/07/22 1249)  tenecteplase (TNKASE) injection for Stroke 19 mg (19 mg Intravenous Given 10/07/22 1227)  iohexol (OMNIPAQUE) 350 MG/ML injection 75 mL (75 mLs Intravenous Contrast Given 10/07/22 1236)    ED Course/ Medical Decision Making/ A&P                           Medical Decision Making Amount and/or Complexity of Data Reviewed Labs: ordered. Radiology: ordered.  Risk Decision regarding hospitalization.   76 year old female with past medical history significant for cochlear implant, anxiety, depression, HLD, HTN, Mnire's disease, bipolar disorder who presents to the emergency department with blurred vision/double vision.  She was brought to the emergency department as a code stroke activated by EMS.  The patient's last known well was around 1030 this morning.  On arrival, the patient's airway was intact and the patient was taken emergently to the [facility] for code stroke imaging with neurology bedside for evaluation.  On arrival, the patient was vitally stable, taken for code stroke imaging with a CT head without acute intracranial abnormality, CTA head and neck negative for large vessel occlusion.  Due to primary concern for acute ischemic stroke, the patient was administered TNK by neurology.  MRI brain was ordered.  Post TNK the patient was observed in the emergency department and then subsequently admitted to neurology with no significant change in mental status.  The patient subsequently admitted to the neuro ICU and critical condition.  Final Clinical Impression(s) / ED Diagnoses Final diagnoses:  Cerebrovascular accident (CVA), unspecified mechanism [facility])    Rx / [place] Orders ED Discharge Orders     None         Regan Lemming, MD 10/07/22  2114

## 2022-10-07 NOTE — Code Documentation (Addendum)
Stroke Response Nurse Documentation Code Documentation  Elizabeth Stone is a 76 y.o. female arriving to [facility]  via [facility] on 10/07/2022 with past medical hx of hard of hearing with cochlear implant; meniere disease, hypertension, anxiety, depression, hyperlipidemia, pre diabetes, fibromyalgia, and bipolar disorder. On No antithrombotic. Code stroke was activated by EMS.   Patient was LKW at 1030 and now complaining of a headache and blurred vision. EMS reported patient had difficulty walking and uses a walker baseline.   Stroke team at the bedside on patient arrival. Labs drawn and patient cleared for CT by EDP. Patient to CT with team. NIHSS 3, see documentation for details and code stroke times. Patient with left hemianopia, dysarthria , and Visual  neglect on exam. The following imaging was completed:  CT Head and CTA. IV Thrombolytic discussed and given 1227. Patient is not a candidate for IR due to no LVO.   Care Plan: Q15 assessments x2h; Q30 x6hr; then hourly; admit to ICU.   Bedside handoff with ED RN Herbert Spires.    Leverne Humbles Stroke Response RN

## 2022-10-07 NOTE — Progress Notes (Signed)
PHARMACIST CODE STROKE RESPONSE  Notified to mix TNK at 1224 by Dr. [name] TNK preparation completed at 1226  TNK dose = 19 mg IV over 5 seconds.   Issues/delays encountered (if applicable):   Bertis Ruddy 10/07/22 12:28 PM

## 2022-10-07 NOTE — H&P (Addendum)
Neurology H&P  CC: code stroke for headache and vision problem  History is obtained from:medical record and patient   HPI: Elizabeth Stone is a 76 y.o. female with past medical history of HOH s/p Cochlear implant, anxiety and depression, HTN, HLD, meniere disease, fibromyalgia, bipolar disorder who presents to [facility] ED for acute onset of headache and blurred vision/double vision. Patient was brought in by EMS and code stroke was activated by EMS. VS with EMS 152/56, HR 86, Sat 94%, BG 152. NIHSS 3 for visual deficit, dysarthria and neglect. Per patient LKW @ 1030 when she developed a severe headache in the front of her head and neck pain with blurry and double vision.   CT head no acute process, ASPECTS 10. CTA negative for LVO.  Patient was deemed appropriate for TNK administration and was administered at 1227.  Risks, benefits, alternatives of IV thrombolysis and details of administration were discussed and family/patient agreed to proceed. CT imaging was reviewed personally, by Dr. [name] prior to IV thrombolysis administration with no evidence of bleed. Thou somewhat limited by her cochlear implants.  LKW: 1030 tpa given?: Yes @ 8657 IR Thrombectomy? No LVO Modified Rankin Scale: 3-Moderate disability-requires help but walks WITHOUT assistance  NIHSS:  NIHSS components Score: Comment  1a Level of Conscious 0'[x]'$  1'[]'$  2'[]'$  3'[]'$      1b LOC Questions 0'[x]'$  1'[]'$  2'[]'$       1c LOC Commands 0'[x]'$  1'[]'$  2'[]'$       2 Best Gaze 0'[x]'$  1'[]'$  2'[]'$       3 Visual 0'[]'$  1'[x]'$  2'[]'$  3'[]'$      4 Facial Palsy 0'[x]'$  1'[]'$  2'[]'$  3'[]'$      5a Motor Arm - left 0'[x]'$  1'[]'$  2'[]'$  3'[]'$  4'[]'$  UN'[]'$    5b Motor Arm - Right 0'[x]'$  1'[]'$  2'[]'$  3'[]'$  4'[]'$  UN'[]'$    6a Motor Leg - Left 0'[x]'$  1'[]'$  2'[]'$  3'[]'$  4'[]'$  UN'[]'$    6b Motor Leg - Right 0'[x]'$  1'[]'$  2'[]'$  3'[]'$  4'[]'$  UN'[]'$    7 Limb Ataxia 0'[x]'$  1'[]'$  2'[]'$  3'[]'$  UN'[]'$     8 Sensory 0'[x]'$  1'[]'$  2'[]'$  UN'[]'$      9 Best Language 0'[x]'$  1'[]'$  2'[]'$  3'[]'$      10 Dysarthria 0'[]'$  1'[x]'$  2'[]'$  UN'[]'$      11 Extinct. and Inattention 0'[]'$  1'[x]'$  2'[]'$       TOTAL: 3      ROS: A complete ROS was performed and is negative except as noted in the HPI.    Past Medical History:  Diagnosis Date   Acute blood loss anemia    hx   Anxiety    Arthritis    Balance problem    Bipolar disorder (HCC)    Cochlear implant in place    Cochlear implant in place    right ear   Depression    Dermatitis    on face   Dyspnea    Fibromyalgia    GERD (gastroesophageal reflux disease)    Hard of hearing    Hearing aid worn    left ear   Hearing impaired    has cochlear implant   HTN (hypertension)    Hypokalemia    Meniere disease    takes HCTZ for her inner ear problem   Mixed hyperlipidemia 09/27/2013   Osteoporosis    Pneumonia 11/2016   Pre-diabetes    Primary localized osteoarthritis of left knee 11/16/2018   Primary localized osteoarthritis of right knee    Slow transit constipation    Unsteady gait      Family History  Problem Relation Age  of Onset   Heart disease Mother    Breast cancer Mother    Cervical cancer Mother    Diabetes Mother    Heart disease Father    Diabetes Maternal Aunt    Colon cancer Neg Hx    Esophageal cancer Neg Hx    Rectal cancer Neg Hx    Stomach cancer Neg Hx    Liver cancer Neg Hx    Pancreatic cancer Neg Hx      Social History:  reports that she quit smoking about 29 years ago. She has a 5.75 pack-year smoking history. She has never used smokeless tobacco. She reports current alcohol use. She reports that she does not use drugs.   Prior to Admission medications   Medication Sig Start Date End Date Taking? Authorizing Provider  acetaminophen (TYLENOL) 500 MG tablet Take 1,000 mg by mouth every 6 (six) hours as needed for mild pain (for pain.).     [provider]  albuterol (VENTOLIN HFA) 108 (90 Base) MCG/ACT inhaler Inhale 2 puffs into the lungs every 6 (six) hours as needed for wheezing or shortness of breath. Patient not taking: Reported on 04/20/2021 06/13/20   Collene Gobble, MD  Ascorbic Acid  (VITAMIN C PO) Take 500 mg by mouth daily.    [provider]  aspirin 81 MG chewable tablet Chew 81 mg by mouth daily.    [provider]  atorvastatin (LIPITOR) 10 MG tablet Take 10 mg by mouth at bedtime.     [provider]  buPROPion (WELLBUTRIN XL) 300 MG 24 hr tablet Take 300 mg by mouth daily.    [provider]  clonazePAM (KLONOPIN) 1 MG tablet Take 1-2 mg by mouth at bedtime. 01/16/21   [provider]  cyclobenzaprine (FLEXERIL) 10 MG tablet Take 1 tablet (10 mg total) by mouth 2 (two) times daily as needed for muscle spasms. Patient not taking: No sig reported 12/11/20   Gareth Morgan, MD  DULoxetine (CYMBALTA) 60 MG capsule Take 60 mg by mouth 2 (two) times daily.     [provider]  hydrochlorothiazide (HYDRODIURIL) 25 MG tablet Take 25 mg by mouth Daily.  08/05/12   [provider]  HYDROcodone-acetaminophen (NORCO) 10-325 MG tablet Take 1 tablet by mouth every 12 (twelve) hours as needed for severe pain.    [provider]  HYDROmorphone (DILAUDID) 2 MG tablet Take 1 tablet (2 mg total) by mouth every 6 (six) hours as needed for up to 10 doses for severe pain. 04/20/21   Wyvonnia Dusky, MD  lamoTRIgine (LAMICTAL) 150 MG tablet Take 300 mg by mouth daily.    [provider]  lidocaine (LIDODERM) 5 % Place 1 patch onto the skin daily. Remove & Discard patch within 12 hours or as directed by MD Patient not taking: No sig reported 12/11/20   Gareth Morgan, MD  meloxicam (MOBIC) 15 MG tablet Take 15 mg by mouth daily.    [provider]  metFORMIN (GLUCOPHAGE-XR) 750 MG 24 hr tablet Take 750 mg by mouth daily with breakfast.    [provider]  montelukast (SINGULAIR) 10 MG tablet Take 10 mg by mouth at bedtime. 07/15/12   [provider]  potassium chloride SA (KLOR-CON) 20 MEQ tablet Take 20 mEq by mouth daily.    [provider]  VITAMIN D PO Take 1,000 Units by  mouth daily.    [provider]     Exam: Current vital signs: Wt  76.8 kg   LMP  (LMP Unknown)   BMI 31.99 kg/m    Physical Exam  Constitutional: Appears well-developed and well-nourished.  Psych: Affect appropriate to situation Eyes: No scleral injection HENT: No OP obstruction. HOH with cochlear implant Head: Normocephalic.  Cardiovascular: Normal rate and regular rhythm.  Respiratory: Effort normal and breath sounds normal to anterior ascultation GI: Soft.  No distension. There is no tenderness.  Skin: WDI  Neuro: Mental Status: Patient is awake, alert, oriented to person, place, month, year, and situation.  Speech is dysarthric No signs of aphasia or neglect. Follows commands. Cranial Nerves: II: Visual Fields with possible left field cut, very hard to decipher. Pupils are equal, round, and reactive to light.   III,IV, VI: EOMI without ptosis or diploplia.  V: Facial sensation is symmetric to temperature VII: Facial movement is symmetric.  VIII: hearing is intact to voice X: Uvula elevates symmetrically XI: Shoulder shrug is symmetric. XII: tongue is midline without atrophy or fasciculations.  Motor: Tone is normal. Bulk is normal. 5/5 strength was present in all four extremities.  Sensory: Appears to be symmetric Cerebellar: Unable to assess   I have reviewed labs in epic and the pertinent results are: Na 126  I have reviewed the images obtained: CTH 1. No acute intracranial abnormality. 2. Aspects is 10. 3. Extensive artifact from cochlear implant on the right.  CTA Head and Neck:  1. Negative for intracranial large vessel occlusion. 2. Atherosclerotic calcification right carotid bifurcation with estimated 55% diameter stenosis proximal right internal carotid artery. 3. Atherosclerotic calcification left carotid bifurcation without significant stenosis. 4. Both vertebral arteries patent to the basilar. 5. Aortic atherosclerosis.  Primary  Diagnosis:  Acute ischemic stroke s/p TNK   Secondary Diagnosis: Essential (primary) hypertension and Hyponatremia  Plan: - Frequent Neuro checks per stroke unit protocol - MRI Brain stroke protocol - Vascular imaging - Repeat CT head 24 hrs s/p TNK. Unable to have MRI due to cochlear implants  - TTE - Lipid panel - Statin - will be started if LDL>70 or otherwise medically indicated - A1C - Antithrombotic - none for 24 hrs TNK. If repeat imaging negative for hemorrhage will start - DVT ppx - SCD's - SBP goal - <180, PRN labetalol if HR>60 and PRN Hydralazine if HR<60 - Telemetry monitoring for arrhythmia - 72h - Swallow screen - will be performed prior to PO intake - Stroke education - will be given - PT/OT/SLP - Dispo: ICU  Beulah Gandy DNP, ACNPC-AG  [facility]   NEUROHOSPITALIST ADDENDUM Performed a face to face diagnostic evaluation.   I have reviewed the contents of history and physical exam as documented by PA/ARNP/Resident and agree with above documentation.  I have discussed and formulated the above plan as documented. Edits to the note have been made as needed.  Impression/Key exam findings/Plan: presents with acute onset diplopia, blurred vision, vertigo and headaches. Reports hx of menieres disease and cochlear implants but not had vertigo in several years. Exam with inconsistent reponses when testing visual field on the left. Repeated multiple times but persistent incorrect responses on the left. Unable to get MRI Brain 2/2 cochlear implants. CTH with significant metal artifact from the implants and no ICH, reviewed this with neuro rads and they do not see ICH. Discussed risks and benefits of tnkase including the fact that I am not sure if she truly is having a stroke. Patient understands the risks and benefits and understandably worried about her vision and agreed to  tnkase administration. No LVO so not a candidate for thrombectomy.  This patient is  critically ill and at significant risk of neurological worsening, death and care requires constant monitoring of vital signs, hemodynamics,respiratory and cardiac monitoring, neurological assessment, discussion with family, other specialists and medical decision making of high complexity. I spent 45 minutes of neurocritical care time  in the care of  this patient. This was time spent independent of any time provided by nurse practitioner or PA.  Donnetta Simpers [facility] Pager Number [phone] 10/07/2022  3:26 PM   Donnetta Simpers, MD [facility] [phone]   If 7pm to 7am, please call on call as listed on AMION.

## 2022-10-07 NOTE — ED Notes (Signed)
[name] (son) would like to be notified when pt is transported upstairs. Phone is [phone]

## 2022-10-07 NOTE — ED Triage Notes (Signed)
Elizabeth Stone is a 76 y.o. female arriving to [facility]  via [facility] on 10/07/2022 with past medical hx of hard of hearing with cochlear implant; meniere disease, hypertension, anxiety, depression, hyperlipidemia, pre diabetes, fibromyalgia, and bipolar disorder. On No antithrombotic. Code stroke was activated by EMS.    Patient was LKW at 1030 and now complaining of a headache and blurred vision. EMS reported patient had difficulty walking and uses a walker baseline.

## 2022-10-07 NOTE — ED Notes (Signed)
RN updated [name], son.

## 2022-10-08 ENCOUNTER — Inpatient Hospital Stay (HOSPITAL_COMMUNITY): Payer: Medicare Other

## 2022-10-08 DIAGNOSIS — I6389 Other cerebral infarction: Secondary | ICD-10-CM

## 2022-10-08 DIAGNOSIS — R2990 Unspecified symptoms and signs involving the nervous system: Secondary | ICD-10-CM | POA: Diagnosis not present

## 2022-10-08 DIAGNOSIS — I639 Cerebral infarction, unspecified: Secondary | ICD-10-CM | POA: Diagnosis not present

## 2022-10-08 LAB — GLUCOSE, CAPILLARY
Glucose-Capillary: 162 mg/dL — ABNORMAL HIGH (ref 70–99)
Glucose-Capillary: 88 mg/dL (ref 70–99)
Glucose-Capillary: 209 mg/dL — ABNORMAL HIGH (ref 70–99)
Glucose-Capillary: 134 mg/dL — ABNORMAL HIGH (ref 70–99)

## 2022-10-08 LAB — HEMOGLOBIN A1C
Hgb A1c MFr Bld: 5.4 % (ref 4.8–5.6)
Mean Plasma Glucose: 108 mg/dL

## 2022-10-08 LAB — MRSA NEXT GEN BY PCR, NASAL: MRSA by PCR Next Gen: NOT DETECTED

## 2022-10-08 LAB — ECHOCARDIOGRAM COMPLETE
Area-P 1/2: 3.65 cm2
Calc EF: 77.4 %
S' Lateral: 2.2 cm
Single Plane A2C EF: 77.8 %
Single Plane A4C EF: 77.7 %
Weight: 2709.01 oz

## 2022-10-08 LAB — LIPID PANEL
Cholesterol: 149 mg/dL (ref 0–200)
HDL: 60 mg/dL (ref >40)
LDL Cholesterol: 77 mg/dL (ref 0–99)
Total CHOL/HDL Ratio: 2.5 RATIO
Triglycerides: 58 mg/dL (ref <150)
VLDL: 12 mg/dL (ref 0–40)

## 2022-10-08 MED ORDER — PANTOPRAZOLE SODIUM 40 MG PO TBEC
40.0000 mg | DELAYED_RELEASE_TABLET | Freq: Every day | ORAL | Status: DC
  Administered 2022-10-08 – 2022-10-11 (×4): 40 mg via ORAL
  Filled 2022-10-08 (×4): qty 1

## 2022-10-08 NOTE — Evaluation (Signed)
Occupational Therapy Evaluation Patient Details Name: Elizabeth Stone MRN: [id number] DOB: 18-Jan-1946 Today's Date: 10/08/2022   History of Present Illness Pt is a 76 y/o female presenting on 12/20 with acute onset of headache and blurred/double vision. CT negative for acute process, TNK given 12/20. Repeat CT 12/21 "Assessment of portions of the R parietal, occipital, and temporal lobes is limited due to streak artifact from patient's cochlear implant. Within this limitation there is no acute intracranial abnormality". PMH includes: anxiety, bipolar disorder, fibromyalgia, HTN, cochlear implant, PNA, meniere disease.   Clinical Impression   PTA patient reports living alone, independent with ADLs and mobility. Admitted for above and presents with problem list below.  She requires min assist for transfers, up to min assist for ADLS. Fatigued during session, intermittent cueing to maintain eyes open during visual and cognition testing.  She reports diplopia, more at distance than close up, but seeing both vertical and horizontal- trial of partial occlusion glasses but not successful; continue to assess.  She follows simple commands, but demonstrates some decreased awareness, problem solving, and safety.  Believe she will best benefit from continued OT services acutely and after dc at AIR level to optimize independence, safety and return to PLOF. Will follow acutely.       Recommendations for follow up therapy are one component of a multi-disciplinary discharge planning process, led by the attending physician.  Recommendations may be updated based on patient status, additional functional criteria and insurance authorization.   Follow Up Recommendations  Acute inpatient rehab (3hours/day)     Assistance Recommended at Discharge Frequent or constant Supervision/Assistance  Patient can return home with the following A little help with walking and/or transfers;A little help with  bathing/dressing/bathroom;Assistance with cooking/housework;Help with stairs or ramp for entrance;Assist for transportation;Direct supervision/assist for financial management;Direct supervision/assist for medications management    Functional Status Assessment  Patient has had a recent decline in their functional status and demonstrates the ability to make significant improvements in function in a reasonable and predictable amount of time.  Equipment Recommendations  Other (comment) (defer)    Recommendations for Other Services Rehab consult     Precautions / Restrictions Precautions Precautions: Fall Precaution Comments: diplopia (distance) Restrictions Weight Bearing Restrictions: No      Mobility Bed Mobility               General bed mobility comments: OOB in recliner upon entry    Transfers Overall transfer level: Needs assistance Equipment used: Rolling walker (2 wheels) Transfers: Sit to/from Stand Sit to Stand: Min assist           General transfer comment: to power up and steady from recliner      Balance Overall balance assessment: Needs assistance Sitting-balance support: No upper extremity supported, Feet supported Sitting balance-Leahy Scale: Fair     Standing balance support: No upper extremity supported, During functional activity Standing balance-Leahy Scale: Poor                             ADL either performed or assessed with clinical judgement   ADL Overall ADL's : Needs assistance/impaired Eating/Feeding: Set up;Sitting   Grooming: Set up;Sitting           Upper Body Dressing : Sitting;Set up   Lower Body Dressing: Minimal assistance;Sit to/from stand Lower Body Dressing Details (indicate cue type and reason): pt able to complete figure 4 with cueing, more difficulty reaching R foot compared to  L             Functional mobility during ADLs: Minimal assistance       Vision Baseline Vision/History: 0 No  visual deficits Ability to See in Adequate Light: 0 Adequate Patient Visual Report: Diplopia;Blurring of vision Vision Assessment?: Yes Eye Alignment: Within Functional Limits Ocular Range of Motion: Within Functional Limits;Other (comment) (requires multiple attempts to reach full ROM on R side) Alignment/Gaze Preference: Within Defined Limits Tracking/Visual Pursuits: Requires cues, head turns, or add eye shifts to track (difficulty holding eyes towards R side, jumping back to midline) Saccades: Additional eye shifts occurred during testing Visual Fields: No apparent deficits Diplopia Assessment: Disappears with one eye closed;Present in far gaze Depth Perception: Overshoots;Undershoots (mild) Additional Comments: Pts report of vision is inconsistent.  Initally reports diplopia of therapist close up, but improves throughout session. Consistent diplopia in far gaze, reports 2 tvs and clocks-- the tvs are horizontal, the clocks are vertical. Some vertical nystagmus noted intermittently.  Eye dominance tested revealing L eye dominance.  Trailed taping of glasses to R eye, as well as L eye- pt continues to report "Its hard to tell if its helping".  She is able to read close up (repeating the 1st line, but correctly reading the remainder of the page) and able to setup her meal with only slight overshooting when pouring sugar into drink (50% on lid).  Continue to assess pts needs- partial occlusion glasses vs patching.     Perception     Praxis      Pertinent Vitals/Pain Pain Assessment Pain Assessment: No/denies pain     Hand Dominance Right   Extremity/Trunk Assessment Upper Extremity Assessment Upper Extremity Assessment: Generalized weakness (B hands arthritic limiting [facility])   Lower Extremity Assessment Lower Extremity Assessment: Defer to PT evaluation       Communication Communication Communication: HOH;Expressive difficulties (cochlear implant, dysarthric)   Cognition  Arousal/Alertness: Awake/alert, Lethargic Behavior During Therapy: WFL for tasks assessed/performed Overall Cognitive Status: Impaired/Different from baseline Area of Impairment: Orientation, Attention, Memory, Following commands, Safety/judgement, Awareness, Problem solving                 Orientation Level: Disoriented to, Time Current Attention Level: Sustained Memory: Decreased short-term memory Following Commands: Follows one step commands consistently, Follows one step commands with increased time, Follows multi-step commands inconsistently Safety/Judgement: Decreased awareness of deficits, Decreased awareness of safety Awareness: Emergent Problem Solving: Slow processing, Requires verbal cues General Comments: patient reports Friday, corrects to thursday when cued.  Awake but appears fatigued, at times requiring tactile cues to maintain alertness. Follows simple commands with increased time, but some decreased awareness, safety and problem solving noted.     General Comments  VSS on RA    Exercises     Shoulder Instructions      Home Living Family/patient expects to be discharged to:: Private residence Living Arrangements: Alone Available Help at Discharge: Family;Available 24 hours/day Type of Home: Apartment (condo) Home Access: Stairs to enter [employer] of Steps: 8 Entrance Stairs-Rails: Right;Left Home Layout: One level     Bathroom Shower/Tub: Occupational psychologist: Handicapped height Bathroom Accessibility: No   Home Equipment: Conservation officer, nature (2 wheels)   Additional Comments: Life alert      Prior Functioning/Environment Prior Level of Function : Independent/Modified Independent;Driving             Mobility Comments: Pt uses a RW inside and outside. Drives ADLs Comments: Stands to shower, independent with ADL's  OT Problem List: Decreased strength;Decreased activity tolerance;Impaired balance (sitting and/or  standing);Impaired vision/perception;Decreased cognition;Decreased safety awareness;Decreased knowledge of use of DME or AE;Decreased knowledge of precautions      OT Treatment/Interventions: Self-care/ADL training;Neuromuscular education;DME and/or AE instruction;Therapeutic activities;Cognitive remediation/compensation;Visual/perceptual remediation/compensation;Patient/family education;Balance training    OT Goals(Current goals can be found in the care plan section) Acute Rehab OT Goals Patient Stated Goal: get better OT Goal Formulation: With patient Time For Goal Achievement: 10/22/22 Potential to Achieve Goals: Good  OT Frequency: Min 2X/week    Co-evaluation              AM-PAC OT "6 Clicks" Daily Activity     Outcome Measure Help from another person eating meals?: A Little Help from another person taking care of personal grooming?: A Little Help from another person toileting, which includes using toliet, bedpan, or urinal?: A Lot Help from another person bathing (including washing, rinsing, drying)?: A Little Help from another person to put on and taking off regular upper body clothing?: A Little Help from another person to put on and taking off regular lower body clothing?: A Little 6 Click Score: 17   End of Session Nurse Communication: Mobility status  Activity Tolerance: Patient tolerated treatment well Patient left: in chair;with call bell/phone within reach;with chair alarm set  OT Visit Diagnosis: Other abnormalities of gait and mobility (R26.89);Muscle weakness (generalized) (M62.81);Low vision, both eyes (H54.2);Other symptoms and signs involving cognitive function;Other symptoms and signs involving the nervous system (R29.898)                Time: 2751-7001 OT Time Calculation (min): 33 min Charges:  OT General Charges $OT Visit: 1 Visit OT Evaluation $OT Eval Moderate Complexity: 1 Mod OT Treatments $Self Care/Home Management : 8-22 mins  Jolaine Artist,  OT Acute Rehabilitation Services Office [phone]   Delight Stare 10/08/2022, 1:57 PM

## 2022-10-08 NOTE — Progress Notes (Signed)
EEG complete - results pending 

## 2022-10-08 NOTE — Progress Notes (Signed)
Patient endorsing diplopia this AM again. Will get a STAT CT Head and a routine EEG.  [place] Pager Number [phone]

## 2022-10-08 NOTE — Evaluation (Signed)
Physical Therapy Evaluation  Patient Details Name: Elizabeth Stone MRN: [id number] DOB: 05-11-1946 Today's Date: 10/08/2022  History of Present Illness  Pt is a 76 y/o female presenting on 12/20 with acute onset of headache and blurred/double vision. CT negative for acute process, TNK given 12/20. Repeat CT 12/21 "Assessment of portions of the R parietal, occipital, and temporal lobes is limited due to streak artifact from patient's cochlear implant. Within this limitation there is no acute intracranial abnormality". PMH includes: anxiety, bipolar disorder, fibromyalgia, HTN, cochlear implant, PNA, meniere disease.   Clinical Impression  Pt admitted with above diagnosis. Pt currently with functional limitations due to the deficits listed below (see PT Problem List). At the time of PT eval pt was able to perform transfers and ambulation with gross min assist and RW for support. Chair follow utilized for safety while in the hall. Pt reports diplopia throughout session and had increased difficulty when looking down the hallway. Recommending post-acute rehab at the AIR level to maximize functional independence and safety prior to return home, as pt is motivated to return to living alone. She does have a son who lives close by and is supportive. Pt will benefit from skilled PT to increase their independence and safety with mobility to allow discharge to the venue listed below.          Recommendations for follow up therapy are one component of a multi-disciplinary discharge planning process, led by the attending physician.  Recommendations may be updated based on patient status, additional functional criteria and insurance authorization.  Follow Up Recommendations Acute inpatient rehab (3hours/day)      Assistance Recommended at Discharge Frequent or constant Supervision/Assistance  Patient can return home with the following  A little help with walking and/or transfers;A little help with  bathing/dressing/bathroom;Assistance with cooking/housework;Assist for transportation;Help with stairs or ramp for entrance    Equipment Recommendations Other (comment) (TBD by next venue of care)  Recommendations for Other Services  Rehab consult    Functional Status Assessment Patient has had a recent decline in their functional status and demonstrates the ability to make significant improvements in function in a reasonable and predictable amount of time.     Precautions / Restrictions Precautions Precautions: Fall Precaution Comments: diplopia (distance) Restrictions Weight Bearing Restrictions: No      Mobility  Bed Mobility Overal bed mobility: Needs Assistance Bed Mobility: Rolling, Sidelying to Sit Rolling: Min guard Sidelying to sit: Min assist       General bed mobility comments: HOB slightly elevated. VC's for log roll for comfort due to back pain. Pt was able to complete with min assist for trunk elevation to full sittiong position.    Transfers Overall transfer level: Needs assistance Equipment used: Rolling walker (2 wheels) Transfers: Sit to/from Stand Sit to Stand: Min assist           General transfer comment: Assist to power up to full stand and gain/maintain standing balance.    Ambulation/Gait Ambulation/Gait assistance: Min assist Gait Distance (Feet): 50 Feet Assistive device: Rolling walker (2 wheels) Gait Pattern/deviations: Step-through pattern, Decreased stride length, Narrow base of support, Trunk flexed Gait velocity: Decreased Gait velocity interpretation: <1.31 ft/sec, indicative of household ambulator   General Gait Details: Slow and guarded. Pt reports diplopia when looking down the hallway. Occasional assist required for balance and walker management. Chair follow utilized.  Stairs            Wheelchair Mobility    Modified Rankin (Stroke Patients Only)  Modified Rankin (Stroke Patients Only) Pre-Morbid Rankin Score: No  significant disability Modified Rankin: Moderately severe disability     Balance Overall balance assessment: Needs assistance Sitting-balance support: No upper extremity supported, Feet supported Sitting balance-Leahy Scale: Fair     Standing balance support: No upper extremity supported, During functional activity Standing balance-Leahy Scale: Poor                               Pertinent Vitals/Pain Pain Assessment Pain Assessment: Faces Faces Pain Scale: Hurts a little bit Pain Location: back Pain Descriptors / Indicators: Discomfort, Aching Pain Intervention(s): Limited activity within patient's tolerance, Monitored during session, Repositioned    Home Living Family/patient expects to be discharged to:: Private residence Living Arrangements: Alone Available Help at Discharge: Family;Available 24 hours/day Type of Home: Apartment (condo) Home Access: Stairs to enter Entrance Stairs-Rails: Psychiatric nurse of Steps: 8   Home Layout: One level Home Equipment: Conservation officer, nature (2 wheels) Additional Comments: Life alert    Prior Function Prior Level of Function : Independent/Modified Independent;Driving             Mobility Comments: Pt uses a RW inside and outside. Drives ADLs Comments: Stands to shower, independent with ADL's     Hand Dominance   Dominant Hand: Right    Extremity/Trunk Assessment   Upper Extremity Assessment Upper Extremity Assessment: Defer to OT evaluation    Lower Extremity Assessment Lower Extremity Assessment: Generalized weakness (Mildly weak with MMT - grossly 4+/5 in quads, hip flexors, hamstrings, ankle DF)    Cervical / Trunk Assessment Cervical / Trunk Assessment: Other exceptions Cervical / Trunk Exceptions: Pt reports vertebral compression fracture however not listed in PMH. Back precautions for comfort.  Communication   Communication: HOH;Expressive difficulties (cochlear implant, dysarthric)   Cognition Arousal/Alertness: Awake/alert, Lethargic Behavior During Therapy: WFL for tasks assessed/performed Overall Cognitive Status: Impaired/Different from baseline Area of Impairment: Orientation, Attention, Memory, Following commands, Safety/judgement, Awareness, Problem solving                 Orientation Level: Disoriented to, Time Current Attention Level: Sustained Memory: Decreased short-term memory Following Commands: Follows one step commands consistently, Follows one step commands with increased time, Follows multi-step commands inconsistently Safety/Judgement: Decreased awareness of deficits, Decreased awareness of safety Awareness: Emergent Problem Solving: Slow processing, Requires verbal cues General Comments: patient reports Friday, corrects to thursday when cued.  Awake but appears fatigued, at times requiring tactile cues to maintain alertness. Follows simple commands with increased time, but some decreased awareness, safety and problem solving noted.        General Comments General comments (skin integrity, edema, etc.): VSS on RA    Exercises     Assessment/Plan    PT Assessment Patient needs continued PT services  PT Problem List Decreased strength;Decreased range of motion;Decreased activity tolerance;Decreased balance;Decreased mobility;Decreased knowledge of use of DME;Decreased cognition;Decreased knowledge of precautions;Decreased safety awareness;Pain       PT Treatment Interventions DME instruction;Gait training;Stair training;Functional mobility training;Therapeutic activities;Therapeutic exercise;Balance training;Patient/family education    PT Goals (Current goals can be found in the Care Plan section)  Acute Rehab PT Goals Patient Stated Goal: Go to rehab, get back to her home alone PT Goal Formulation: With patient Time For Goal Achievement: 10/22/22 Potential to Achieve Goals: Good    Frequency Min 4X/week     Co-evaluation  AM-PAC PT "6 Clicks" Mobility  Outcome Measure Help needed turning from your back to your side while in a flat bed without using bedrails?: A Little Help needed moving from lying on your back to sitting on the side of a flat bed without using bedrails?: A Little Help needed moving to and from a bed to a chair (including a wheelchair)?: A Little Help needed standing up from a chair using your arms (e.g., wheelchair or bedside chair)?: A Little Help needed to walk in hospital room?: A Little Help needed climbing 3-5 steps with a railing? : A Little 6 Click Score: 18    End of Session Equipment Utilized During Treatment: Gait belt Activity Tolerance: Patient tolerated treatment well Patient left: in chair;with call bell/phone within reach;Other (comment) (OT present) Nurse Communication: Mobility status PT Visit Diagnosis: Unsteadiness on feet (R26.81);Pain Pain - part of body:  (back)    Time: 5885-0277 PT Time Calculation (min) (ACUTE ONLY): 28 min   Charges:   PT Evaluation $PT Eval Moderate Complexity: 1 Mod PT Treatments $Gait Training: 8-22 mins        Rolinda Roan, PT, DPT Acute Rehabilitation Services Secure Chat Preferred Office: [phone]   Thelma Comp 10/08/2022, 3:26 PM

## 2022-10-08 NOTE — Progress Notes (Signed)
  Echocardiogram 2D Echocardiogram has been performed.  Elizabeth Stone 10/08/2022, 8:57 AM

## 2022-10-08 NOTE — Progress Notes (Signed)
OT Cancellation Note  Patient Details Name: Elizabeth Stone MRN: [id number] DOB: Feb 24, 1946   Cancelled Treatment:    Reason Eval/Treat Not Completed: Active bedrest order. Will follow and see as appropriate.   Jolaine Artist, OT Acute Rehabilitation Services Office [phone]   Delight Stare 10/08/2022, 8:14 AM

## 2022-10-08 NOTE — Plan of Care (Signed)
  Problem: Education: Goal: Knowledge of disease or condition will improve Outcome: Progressing   Problem: Self-Care: Goal: Ability to participate in self-care as condition permits will improve 10/08/2022 0513 by Nicholes Calamity, RN Outcome: Progressing 10/08/2022 0512 by Nicholes Calamity, RN Outcome: Progressing   Problem: Nutrition: Goal: Risk of aspiration will decrease Outcome: Progressing   Problem: Clinical Measurements: Goal: Ability to maintain clinical measurements within normal limits will improve Outcome: Progressing   Problem: Activity: Goal: Risk for activity intolerance will decrease Outcome: Progressing

## 2022-10-08 NOTE — Progress Notes (Addendum)
STROKE TEAM PROGRESS NOTE   INTERVAL HISTORY No family at the bedside.   Repeat head CT negative for acute abnormality.  Unable to have MRI due to cochlear implants. States her double/triple vision is intermittent. Continues to be dysarthric. Hemodynamically stable CT head x 2 is negative for acute infarct.  Blood pressure adequately controlled Vitals:   10/08/22 0525 10/08/22 0600 10/08/22 0700 10/08/22 0805  BP: 138/61 (!) 109/48 137/64   Pulse: 85 80 84   Resp: '13 16 19   '$ Temp:    98.2 F (36.8 C)  TempSrc:    Oral  SpO2: 97% 95% 95%   Weight:       CBC:  Recent Labs  Lab 10/07/22 1221  WBC 6.8  NEUTROABS 4.3  HGB 13.7  13.9  HCT 38.7  41.0  MCV 94.9  PLT 742   Basic Metabolic Panel:  Recent Labs  Lab 10/07/22 1221  NA 126*  126*  K 3.6  3.8  CL 91*  90*  CO2 23  GLUCOSE 157*  157*  BUN 13  16  CREATININE 0.77  0.60  CALCIUM 9.9   Lipid Panel: No results for input(s): "CHOL", "TRIG", "HDL", "CHOLHDL", "VLDL", "LDLCALC" in the last 168 hours. HgbA1c:  Recent Labs  Lab 10/07/22 1221  HGBA1C 5.4   Urine Drug Screen: No results for input(s): "LABOPIA", "COCAINSCRNUR", "LABBENZ", "AMPHETMU", "THCU", "LABBARB" in the last 168 hours.  Alcohol Level  Recent Labs  Lab 10/07/22 1221  ETH <10    IMAGING past 24 hours CT ANGIO HEAD NECK W WO CM (CODE STROKE)  Result Date: 10/07/2022 CLINICAL DATA:  Acute neuro deficit rule out stroke EXAM: CT ANGIOGRAPHY HEAD AND NECK TECHNIQUE: Multidetector CT imaging of the head and neck was performed using the standard protocol during bolus administration of intravenous contrast. Multiplanar CT image reconstructions and MIPs were obtained to evaluate the vascular anatomy. Carotid stenosis measurements (when applicable) are obtained utilizing NASCET criteria, using the distal internal carotid diameter as the denominator. RADIATION DOSE REDUCTION: This exam was performed according to the departmental dose-optimization  program which includes automated exposure control, adjustment of the mA and/or kV according to patient size and/or use of iterative reconstruction technique. CONTRAST:  77m OMNIPAQUE IOHEXOL 350 MG/ML SOLN COMPARISON:  CT head 10/07/2022 FINDINGS: CTA NECK FINDINGS Aortic arch: Atherosclerotic calcification aortic arch, mild. Proximal great vessels patent without stenosis. There is streak artifact obscuring the proximal great vessels and carotid bifurcation bilaterally due left shoulder replacement. Right carotid system: Atherosclerotic calcification right carotid bifurcation. Lumen difficult to accurately measure due to streak artifact and motion. Estimated 55% diameter stenosis proximal right internal carotid artery. Left carotid system: Atherosclerotic calcification left carotid bifurcation without significant stenosis. Vertebral arteries: Both vertebral arteries patent to the skull base without stenosis. Left vertebral artery dominant. Skeleton: Cervical spondylosis.  No acute skeletal abnormality. Other neck: Negative for mass or adenopathy in the neck. Left shoulder replacement. Upper chest: Lung apices clear bilaterally. Chronic right anterior rib fracture. Review of the MIP images confirms the above findings CTA HEAD FINDINGS Anterior circulation: Mild atherosclerotic calcification in the cavernous carotid bilaterally. Negative for stenosis. Anterior and middle cerebral arteries patent without large vessel occlusion or flow limiting stenosis. Negative for aneurysm. Streak artifact from right cochlear implant. Posterior circulation: Both vertebral arteries patent to the basilar. PICA patent bilaterally. Basilar widely patent. Superior cerebellar and posterior cerebral arteries patent without stenosis or large vessel occlusion. Venous sinuses: Limited venous enhancement.  No focal abnormality Anatomic  variants: None Review of the MIP images confirms the above findings IMPRESSION: 1. Negative for intracranial  large vessel occlusion. 2. Atherosclerotic calcification right carotid bifurcation with estimated 55% diameter stenosis proximal right internal carotid artery. 3. Atherosclerotic calcification left carotid bifurcation without significant stenosis. 4. Both vertebral arteries patent to the basilar. 5. Aortic atherosclerosis. Aortic Atherosclerosis (ICD10-I70.0). Electronically Signed   By: Franchot Gallo M.D.   On: 10/07/2022 12:53   CT HEAD CODE STROKE WO CONTRAST  Result Date: 10/07/2022 CLINICAL DATA:  Code stroke.  Acute neuro deficit.  Rule out stroke EXAM: CT HEAD WITHOUT CONTRAST TECHNIQUE: Contiguous axial images were obtained from the base of the skull through the vertex without intravenous contrast. RADIATION DOSE REDUCTION: This exam was performed according to the departmental dose-optimization program which includes automated exposure control, adjustment of the mA and/or kV according to patient size and/or use of iterative reconstruction technique. COMPARISON:  CT head 03/15/2021 FINDINGS: Brain: Cochlear implant on the right causing significant streak artifact. Ventricle size normal. Patchy white matter hypodensity bilaterally is similar to the prior study. Negative for acute infarct, hemorrhage, mass Vascular: Negative for hyperdense vessel Skull: Negative Sinuses/Orbits: Paranasal sinuses clear. Bilateral cataract extraction Other: None ASPECTS ([place] Stroke Program Early CT Score) - Ganglionic level infarction (caudate, lentiform nuclei, internal capsule, insula, M1-M3 cortex): 7 - Supraganglionic infarction (M4-M6 cortex): 3 Total score (0-10 with 10 being normal): 10 IMPRESSION: 1. No acute intracranial abnormality. 2. Aspects is 10. 3. Extensive artifact from cochlear implant on the right. 4. Code stroke imaging results were communicated on 10/07/2022 at 12:27 pm to provider [name] via [facility] text page Electronically Signed   By: Franchot Gallo M.D.   On: 10/07/2022 12:27    PHYSICAL  EXAM  Physical Exam  Constitutional: Appears well-developed and well-nourished pleasant elderly Caucasian lady.   Cardiovascular: Normal rate and regular rhythm.  Respiratory: Effort normal, non-labored breathing  Neuro: Mental Status: Patient is awake, alert, oriented to person, place, month, year, and situation. Speech is dysarthric Cranial Nerves: II: Visual Fields are full. Pupils are equal, round, and reactive to light.   III,IV, VI: EOMI without ptosis. Endorses double and triple vision intermittently and with one eye covered  V: Facial sensation is symmetric to temperature VII: Facial movement is symmetric resting and smiling VIII: Hard of hearing, cochlear implant in place X: Palate elevates symmetrically XI: Shoulder shrug is symmetric. XII: Tongue protrudes midline without atrophy or fasciculations.  Motor: Tone is normal. Bulk is normal. 5/5 strength was present in all four extremities.  Sensory: Sensation is symmetric to light touch and temperature in the arms and legs.  Cerebellar: FNF and HKS inaccurate, but no gross ataxia    ASSESSMENT/PLAN Elizabeth Stone is a 76 y.o. female with history of HOH s/p Cochlear implant, anxiety and depression, HTN, HLD, meniere disease, fibromyalgia, bipolar disorder who presents to [facility] ED for acute onset of headache and blurred vision/double vision. Her vision is fluctuating, she states that the diplopia comes and goes. Will do one more CT scan tomorrow morning. CIR eval pending.  Stroke:  Stroke like symptoms s/p TNK Etiology:  small vessel disease  Code Stroke CT head No acute abnormality. Streak artifact from cochlear implant CTA head & neck No LVO Repeat head CT- no acute intracranial abnormality Carotid Doppler   2D Echo EF 65-70% LDL 77 HgbA1c 5.4 VTE prophylaxis - SCDs    Diet   Diet Heart Room service appropriate? Yes; Fluid consistency: Thin   aspirin  81 mg daily prior to admission, now on No antithrombotic. Until  24 hours post TNK Therapy recommendations:  CIR Disposition:  pending  Hypertension Home meds:  hydrochlorothiazide Stable BP goal less than 180 Long-term BP goal normotensive  Hyperlipidemia Home meds:  Lipitor '10mg'$  LDL 77, goal < 70 Increase atorvastatin to '20mg'$  Continue statin at discharge  Diabetes type II Controlled Home meds:  metformin HgbA1c 5.4, goal < 7.0 CBGs Recent Labs    10/07/22 1213 10/07/22 2315 10/08/22 0804  GLUCAP 175* 93 162*    SSI  Other Stroke Risk Factors Advanced Age >/= 65  Obesity, Body mass index is 31.99 kg/m., BMI >/= 30 associated with increased stroke risk, recommend weight loss, diet and exercise as appropriate   Other Active Problems Menieres disease Fibromyalgia Bipolar disorder Anxiety  Hospital day # 1  Patient seen and examined by NP/APP with MD. MD to update note as needed.   Janine Ores, DNP, FNP-BC [facility] Pager: [phone]  I have personally obtained history,examined this patient, reviewed notes, independently viewed imaging studies, participated in medical decision making and plan of care.ROS completed by me personally and pertinent positives fully documented  I have made any additions or clarifications directly to the above note. Agree with note above.  Patient with with sudden onset of dysarthria and diplopia probably small brainstem infarct and received IV TNK and has shown only slight improvement.  Neurological exam is nonfocal except for subjective diplopia and mild dysarthria.  Continue close neurological monitoring as per post TNK protocol strict blood pressure control with blood pressure goal below 185/110.  Mobilize out of bed.  Therapy consults.  Continue ongoing stroke workup.  Discussed with son over the phone and answered questions.  This patient is critically ill and at significant risk of neurological worsening, death and care requires constant monitoring of vital signs,  hemodynamics,respiratory and cardiac monitoring, extensive review of multiple databases, frequent neurological assessment, discussion with family, other specialists and medical decision making of high complexity.I have made any additions or clarifications directly to the above note.This critical care time does not reflect procedure time, or teaching time or supervisory time of PA/NP/Med Resident etc but could involve care discussion time.  I spent 30 minutes of neurocritical care time  in the care of  this patient.     Antony Contras, MD Medical Director [facility] Pager: [phone] 10/08/2022 5:06 PM  To contact Stroke Continuity provider, please refer to [email or web address] After hours, contact General Neurology

## 2022-10-08 NOTE — Progress Notes (Signed)
SLP Cancellation Note  Patient Details Name: YATZIRY DEAKINS MRN: [id number] DOB: 07/22/1946   Cancelled treatment:       Reason Eval/Treat Not Completed: Patient at procedure or test/unavailable   Elvina Sidle, M.S., CCC-SLP 10/08/2022, 2:35 PM

## 2022-10-08 NOTE — Progress Notes (Signed)
Pt reporting double vision again with worsening slurred speech.VSS and NIH is 4. Md notified and STAT head CT ordered.

## 2022-10-08 NOTE — Procedures (Signed)
Patient Name: Elizabeth Stone  MRN: [id number]  Epilepsy Attending: Lora Havens  Referring Physician/Provider: Donnetta Simpers, MD  Date: 10/08/2022 Duration: 24.26 mins  Patient history: 76yo F presents with acute onset diplopia, blurred vision, vertigo and headaches. EEg to evaluate for seizure  Level of alertness: Awake  AEDs during EEG study: LTG, Clonazepam  Technical aspects: This EEG study was done with scalp electrodes positioned according to the 10-20 International system of electrode placement. Electrical activity was reviewed with band pass filter of 1-'70Hz'$ , sensitivity of 7 uV/mm, display speed of 71m/sec with a '60Hz'$  notched filter applied as appropriate. EEG data were recorded continuously and digitally stored.  Video monitoring was available and reviewed as appropriate.  Description: The posterior dominant rhythm consists of 8-9 Hz activity of moderate voltage (25-35 uV) seen predominantly in posterior head regions, symmetric and reactive to eye opening and eye closing. Hyperventilation and photic stimulation were not performed.     IMPRESSION: This study is within normal limits. No seizures or epileptiform discharges were seen throughout the recording.  A normal interictal EEG does not exclude the diagnosis of epilepsy.  [name] [name]

## 2022-10-08 NOTE — Progress Notes (Signed)
? ?  Inpatient Rehab Admissions Coordinator : ? ?Per therapy recommendations, patient was screened for CIR candidacy by Josclyn Rosales RN MSN.  At this time patient appears to be a potential candidate for CIR. I will place a rehab consult per protocol for full assessment. Please call me with any questions. ? ?Prairie Stenberg RN MSN ?Admissions Coordinator ?[phone] ?  ?

## 2022-10-09 ENCOUNTER — Inpatient Hospital Stay (HOSPITAL_COMMUNITY): Payer: Medicare Other

## 2022-10-09 ENCOUNTER — Encounter (HOSPITAL_COMMUNITY): Payer: Self-pay | Admitting: Neurology

## 2022-10-09 DIAGNOSIS — I639 Cerebral infarction, unspecified: Secondary | ICD-10-CM | POA: Diagnosis not present

## 2022-10-09 MED ORDER — AMLODIPINE BESYLATE 5 MG PO TABS
5.0000 mg | ORAL_TABLET | Freq: Every day | ORAL | Status: DC
  Administered 2022-10-10 – 2022-10-11 (×2): 5 mg via ORAL
  Filled 2022-10-09 (×2): qty 1

## 2022-10-09 MED ORDER — CLONAZEPAM 0.5 MG PO TBDP
1.0000 mg | ORAL_TABLET | Freq: Every day | ORAL | Status: DC
  Administered 2022-10-09 – 2022-10-10 (×2): 1 mg via ORAL
  Filled 2022-10-09 (×2): qty 2

## 2022-10-09 MED ORDER — LOSARTAN POTASSIUM 50 MG PO TABS
100.0000 mg | ORAL_TABLET | Freq: Every day | ORAL | Status: DC
  Administered 2022-10-10 – 2022-10-11 (×2): 100 mg via ORAL
  Filled 2022-10-09 (×2): qty 2

## 2022-10-09 NOTE — Progress Notes (Signed)
SLP Cancellation Note  Patient Details Name: Elizabeth Stone MRN: [id number] DOB: 1946/06/13   Cancelled treatment:       Reason Eval/Treat Not Completed: Other (comment) (Pt was approached for speech-language-cognition evaluation. During the initial discussion, pt discovered that her cochlear implant battery was broken and that she did not leave it this way. Pt was upset since these reportedly cost $28,000 and she therefore requested to speak with the director and that the evaluation be deferred to facilitate this. SLP will follow up later as schedule allows.)  Benny Deutschman I. Hardin Negus, [place], [place] Office number [phone]  Horton Marshall 10/09/2022, 10:25 AM

## 2022-10-09 NOTE — Progress Notes (Signed)
STROKE TEAM PROGRESS NOTE   INTERVAL HISTORY Patient states that she is doing well.  States her double/triple vision is improved.  Continues to be dysarthric. Hemodynamically stable CT head x 3 is negative for acute infarct.  Blood pressure adequately controlled Vitals:   10/09/22 0757 10/09/22 1053 10/09/22 1110 10/09/22 1705  BP: (!) 153/65  (!) 165/93 (!) 172/74  Pulse: 91  89 90  Resp: '18  18 18  '$ Temp: 98.2 F (36.8 C)  97.6 F (36.4 C) 98 F (36.7 C)  TempSrc: Oral  Oral Oral  SpO2: 98%  98% 98%  Weight:      Height:  '5\' 1"'$  (1.549 m)     CBC:  Recent Labs  Lab 10/07/22 1221  WBC 6.8  NEUTROABS 4.3  HGB 13.7  13.9  HCT 38.7  41.0  MCV 94.9  PLT 161   Basic Metabolic Panel:  Recent Labs  Lab 10/07/22 1221  NA 126*  126*  K 3.6  3.8  CL 91*  90*  CO2 23  GLUCOSE 157*  157*  BUN 13  16  CREATININE 0.77  0.60  CALCIUM 9.9   Lipid Panel:  Recent Labs  Lab 10/08/22 0658  CHOL 149  TRIG 58  HDL 60  CHOLHDL 2.5  VLDL 12  LDLCALC 77   HgbA1c:  Recent Labs  Lab 10/07/22 1221  HGBA1C 5.4   Urine Drug Screen: No results for input(s): "LABOPIA", "COCAINSCRNUR", "LABBENZ", "AMPHETMU", "THCU", "LABBARB" in the last 168 hours.  Alcohol Level  Recent Labs  Lab 10/07/22 1221  ETH <10    IMAGING past 24 hours CT HEAD WO CONTRAST (5MM)  Result Date: 10/09/2022 CLINICAL DATA:  24 hours post TNK EXAM: CT HEAD WITHOUT CONTRAST TECHNIQUE: Contiguous axial images were obtained from the base of the skull through the vertex without intravenous contrast. RADIATION DOSE REDUCTION: This exam was performed according to the departmental dose-optimization program which includes automated exposure control, adjustment of the mA and/or kV according to patient size and/or use of iterative reconstruction technique. COMPARISON:  Yesterday FINDINGS: Brain: No evidence of acute infarction, hemorrhage, hydrocephalus, extra-axial collection or mass lesion/mass effect. Chronic  small vessel ischemic type change in the cerebral white matter. Streak artifact which is unavoidable from cochlear implant Vascular: No hyperdense vessel or unexpected calcification. Skull: Normal. Negative for fracture or focal lesion. Sinuses/Orbits: Cochlear implant on the right. IMPRESSION: Stable head CT.  No hemorrhage or visible gray matter infarct. Electronically Signed   By: Jorje Guild M.D.   On: 10/09/2022 04:27    PHYSICAL EXAM  Physical Exam  Constitutional: Appears well-developed and well-nourished pleasant elderly Caucasian lady.   Cardiovascular: Normal rate and regular rhythm.  Respiratory: Effort normal, non-labored breathing  Neuro: Mental Status: Patient is awake, alert, oriented to person, place, month, year, and situation. Speech is dysarthric Cranial Nerves: II: Visual Fields are full. Pupils are equal, round, and reactive to light.   III,IV, VI: EOMI without ptosis. Endorses double and triple vision intermittently and with one eye covered  V: Facial sensation is symmetric to temperature VII: Facial movement is symmetric resting and smiling VIII: Hard of hearing, cochlear implant in place X: Palate elevates symmetrically XI: Shoulder shrug is symmetric. XII: Tongue protrudes midline without atrophy or fasciculations.  Motor: Tone is normal. Bulk is normal. 5/5 strength was present in all four extremities.  Sensory: Sensation is symmetric to light touch and temperature in the arms and legs.  Cerebellar: FNF and HKS inaccurate, but  no gross ataxia    ASSESSMENT/PLAN Ms. LATICHA FERRUCCI is a 76 y.o. female with history of HOH s/p Cochlear implant, anxiety and depression, HTN, HLD, meniere disease, fibromyalgia, bipolar disorder who presents to [facility] ED for acute onset of headache and blurred vision/double vision. Her vision is fluctuating, she states that the diplopia comes and goes. Will do one more CT scan tomorrow morning. CIR eval pending.  Stroke:  Stroke like  symptoms s/p TNK Etiology:  small vessel disease  Code Stroke CT head No acute abnormality. Streak artifact from cochlear implant CTA head & neck No LVO Repeat head CT- no acute intracranial abnormality Carotid Doppler   2D Echo EF 65-70% LDL 77 HgbA1c 5.4 VTE prophylaxis - SCDs    Diet   Diet Heart Room service appropriate? No; Fluid consistency: Thin   aspirin 81 mg daily prior to admission, now on No antithrombotic. Until 24 hours post TNK Therapy recommendations:  CIR Disposition:  pending  Hypertension Home meds:  hydrochlorothiazide Stable BP goal less than 180 Long-term BP goal normotensive  Hyperlipidemia Home meds:  Lipitor '10mg'$  LDL 77, goal < 70 Increase atorvastatin to '20mg'$  Continue statin at discharge  Diabetes type II Controlled Home meds:  metformin HgbA1c 5.4, goal < 7.0 CBGs Recent Labs    10/08/22 1152 10/08/22 1535 10/08/22 1952  GLUCAP 88 209* 134*    SSI  Other Stroke Risk Factors Advanced Age >/= 78  Obesity, Body mass index is 31.99 kg/m., BMI >/= 30 associated with increased stroke risk, recommend weight loss, diet and exercise as appropriate   Other Active Problems Menieres disease Fibromyalgia Bipolar disorder Anxiety  Hospital day # 2   Patient with with sudden onset of dysarthria and diplopia probably small brainstem infarct and received IV TNK and has shown only slight improvement.  Neurological exam is nonfocal except for subjective diplopia and mild dysarthria.    Mobilize out of bed.  Therapy recommended inpatient rehab.  Medically stable for transfer to rehab when bed available.     Antony Contras, MD Medical Director [facility] Pager: [phone] 10/09/2022 5:09 PM  To contact Stroke Continuity provider, please refer to [email or web address] After hours, contact General Neurology

## 2022-10-09 NOTE — Progress Notes (Signed)
  Inpatient Rehabilitation Admissions Coordinator   Met with patient at bedside for rehab assessment. We discussed goals and expectations of a possible CIR admit.  She would like me to follow up with her son to clarify what caregiver supports he and his wife can provide. Also she is questioning if she is mobilizing well enough to directly d/c home with son and HH. I await further therapy progress today and I will contact son to further discuss. Please call me with any questions.   Danne Baxter, RN, MSN Rehab Admissions Coordinator [phone]

## 2022-10-09 NOTE — Progress Notes (Signed)
  Inpatient Rehabilitation Admissions Coordinator   Met with patient and son at bedside for rehab assessment. We discussed goals and expectations of a possible CIR admit. They prefer CIR for rehab. Family can provide expected caregiver support that is recommended . I will begin insurance Auth with [facility] for possible CIR admit pending approval. Please call me with any questions.   Danne Baxter, RN, MSN Rehab Admissions Coordinator [phone]

## 2022-10-09 NOTE — Progress Notes (Signed)
Occupational Therapy Treatment Patient Details Name: Elizabeth Stone MRN: [id number] DOB: Oct 09, 1946 Today's Date: 10/09/2022   History of present illness Pt is a 76 y/o female presenting on 12/20 with acute onset of headache and blurred/double vision. CT negative for acute process, TNK given 12/20. Repeat CT 12/21 "Assessment of portions of the R parietal, occipital, and temporal lobes is limited due to streak artifact from patient's cochlear implant. Within this limitation there is no acute intracranial abnormality". PMH includes: anxiety, bipolar disorder, fibromyalgia, HTN, cochlear implant, PNA, meniere disease.   OT comments  Pt progressing with rehab. VSS on RA however pt complaining of dizziness with mobility. Recommend taking orthostatics. Pt reported vision has improved, however once ambulating and more active, pt required mod A to prevent fall due to falling toward R. Pt complaining of double and triple vision, even with monocular vision. Appeared to have nystagmus. Poor orientation of self in space and bumping into objects on L. Recommend intensive rehab at AIR to maximize functional level of independence to facilitate safe DC home with support.    Recommendations for follow up therapy are one component of a multi-disciplinary discharge planning process, led by the attending physician.  Recommendations may be updated based on patient status, additional functional criteria and insurance authorization.    Follow Up Recommendations  Acute inpatient rehab (3hours/day)     Assistance Recommended at Discharge Frequent or constant Supervision/Assistance  Patient can return home with the following  A little help with walking and/or transfers;A little help with bathing/dressing/bathroom;Assistance with cooking/housework;Help with stairs or ramp for entrance;Assist for transportation;Direct supervision/assist for financial management;Direct supervision/assist for medications management    Equipment Recommendations  Other (comment)    Recommendations for Other Services Rehab consult    Precautions / Restrictions Precautions Precautions: Fall Precaution Comments: impaired vision       Mobility Bed Mobility Overal bed mobility: Needs Assistance Bed Mobility: Supine to Sit, Sit to Supine     Supine to sit: Supervision Sit to supine: Supervision        Transfers Overall transfer level: Needs assistance   Transfers: Sit to/from Stand Sit to Stand: Min assist                 Balance Overall balance assessment: Needs assistance   Sitting balance-Leahy Scale: Fair       Standing balance-Leahy Scale: Poor                             ADL either performed or assessed with clinical judgement   ADL Overall ADL's : Needs assistance/impaired Eating/Feeding: Set up;Sitting   Grooming: Set up;Sitting   Upper Body Bathing: Set up;Supervision/ safety;Sitting   Lower Body Bathing: Minimal assistance;Sit to/from stand   Upper Body Dressing : Set up;Supervision/safety;Sitting   Lower Body Dressing: Minimal assistance;Sit to/from stand   Toilet Transfer: Minimal assistance;Ambulation   Toileting- Clothing Manipulation and Hygiene: Minimal assistance       Functional mobility during ADLs: Moderate assistance (mod A to prevent fall toward L wihtout use of AD)      Extremity/Trunk Assessment Upper Extremity Assessment Upper Extremity Assessment:  (L grip feels slightly weaker than R however both functional; hx of frozen shoulder on R; deformities B hands from arthritis - using functionally - difficulty with fine motor at baseline)   Lower Extremity Assessment Lower Extremity Assessment: Defer to PT evaluation        Vision   Additional Comments: vision  impaired; abl eto read words on baord adn up close without difficulty; after mobilizing pt then complained of double and triple vision, even with monocular vision; will trial letter  cancellation; will continue to assess; pt reports taped glasses "don't help"; unable to maintain fixation on target when tracking; extra eye movements during saccades   Perception Perception Perception: Impaired   Praxis Praxis Praxis:  (poor sense of self in environment; poor spatial positioning when walking to the bathroom - running into sink on L)    Cognition Arousal/Alertness: Awake/alert Behavior During Therapy: Impulsive Overall Cognitive Status: Difficult to assess                     Current Attention Level: Selective   Following Commands: Follows one step commands consistently Safety/Judgement: Decreased awareness of safety, Decreased awareness of deficits Awareness: Emergent Problem Solving: Slow processing General Comments: Pt given instructions to throw her papaer towel in the trash. Pt verbalized understanding to throw the paper towel in the trashm however she threw it into the shower        Exercises Exercises: Other exercises Other Exercises Other Exercises: visual fixation, then pursuits in horizontal plane    Shoulder Instructions       General Comments SpO2 97 on RA    Pertinent Vitals/ Pain       Pain Assessment Pain Assessment: Faces Faces Pain Scale: Hurts little more Pain Location: back Pain Descriptors / Indicators: Discomfort, Aching Pain Intervention(s): Limited activity within patient's tolerance  Home Living                                          Prior Functioning/Environment              Frequency  Min 2X/week        Progress Toward Goals  OT Goals(current goals can now be found in the care plan section)  Progress towards OT goals: Progressing toward goals  Acute Rehab OT Goals Patient Stated Goal: to get better adn go home OT Goal Formulation: With patient Time For Goal Achievement: 10/22/22 Potential to Achieve Goals: Good ADL Goals Pt Will Perform Grooming: with modified  independence;standing Pt Will Perform Lower Body Dressing: with modified independence;sit to/from stand Pt Will Transfer to Toilet: with modified independence;ambulating Pt Will Perform Toileting - Clothing Manipulation and hygiene: with modified independence;sit to/from stand Additional ADL Goal #1: Pt will utilize visual compensatory techniques with supervision to optimize safety during ADL routine.  Plan Discharge plan remains appropriate    Co-evaluation                 AM-PAC OT "6 Clicks" Daily Activity     Outcome Measure   Help from another person eating meals?: A Little Help from another person taking care of personal grooming?: A Little Help from another person toileting, which includes using toliet, bedpan, or urinal?: A Little Help from another person bathing (including washing, rinsing, drying)?: A Little Help from another person to put on and taking off regular upper body clothing?: A Little Help from another person to put on and taking off regular lower body clothing?: A Little 6 Click Score: 18    End of Session Equipment Utilized During Treatment: Gait belt  OT Visit Diagnosis: Other abnormalities of gait and mobility (R26.89);Muscle weakness (generalized) (M62.81);Low vision, both eyes (H54.2);Other symptoms and signs involving cognitive function;Other symptoms  and signs involving the nervous system (R29.898)   Activity Tolerance Patient tolerated treatment well   Patient Left in bed;in CPM;with bed alarm set;with family/visitor present   Nurse Communication Mobility status;Other (comment) (encourage staff to walk her to the bathroom or use [facility])        Time: 7493-5521 OT Time Calculation (min): 32 min  Charges: OT General Charges $OT Visit: 1 Visit OT Treatments $Self Care/Home Management : 23-37 mins  Maurie Boettcher, OT/L   Acute OT Clinical Specialist [place] Pager [phone] Office [phone]    [facility] 10/09/2022, 2:06 PM

## 2022-10-09 NOTE — Progress Notes (Signed)
Physical Therapy Treatment Patient Details Name: Elizabeth Stone MRN: [id number] DOB: 24-Jul-1946 Today's Date: 10/09/2022   History of Present Illness Pt is a 76 y/o female presenting on 12/20 with acute onset of headache and blurred/double vision. CT negative for acute process, TNK given 12/20. Repeat CT 12/21 "Assessment of portions of the R parietal, occipital, and temporal lobes is limited due to streak artifact from patient's cochlear implant. Within this limitation there is no acute intracranial abnormality". PMH includes: anxiety, bipolar disorder, fibromyalgia, HTN, cochlear implant, PNA, meniere disease.    PT Comments    Pt making gradual progress but is inconsistent (required more assist and unsteady with OT than with PT today).  She required min A for balance and frequent cues for safety.  Denied any diplopia today.  OT was concerned about possible orthostatic hypotension but BP stable during PT session.  Session focused on ambulation and static balance challenges (pt only able to accept minimal challenges).  Continue plan of care.     Recommendations for follow up therapy are one component of a multi-disciplinary discharge planning process, led by the attending physician.  Recommendations may be updated based on patient status, additional functional criteria and insurance authorization.  Follow Up Recommendations  Acute inpatient rehab (3hours/day)     Assistance Recommended at Discharge Frequent or constant Supervision/Assistance  Patient can return home with the following A little help with walking and/or transfers;A little help with bathing/dressing/bathroom;Assistance with cooking/housework;Assist for transportation;Help with stairs or ramp for entrance   Equipment Recommendations  Other (comment) (TBD next venue)    Recommendations for Other Services Rehab consult     Precautions / Restrictions Precautions Precautions: Fall Precaution Comments: impaired vision      Mobility  Bed Mobility Overal bed mobility: Needs Assistance Bed Mobility: Supine to Sit, Sit to Supine     Supine to sit: Supervision Sit to supine: Supervision        Transfers Overall transfer level: Needs assistance Equipment used: Rolling walker (2 wheels) Transfers: Sit to/from Stand Sit to Stand: Min assist           General transfer comment: Min A to steady; performed x 3    Ambulation/Gait Ambulation/Gait assistance: Min assist Gait Distance (Feet): 80 Feet Assistive device: Rolling walker (2 wheels) Gait Pattern/deviations: Step-through pattern, Narrow base of support Gait velocity: Decreased     General Gait Details: Cues for RW around obstacles and RW proxmity.  Min A to steady; denied diplopia   Marine scientist Rankin (Stroke Patients Only) Modified Rankin (Stroke Patients Only) Pre-Morbid Rankin Score: No significant disability Modified Rankin: Moderately severe disability     Balance Overall balance assessment: Needs assistance Sitting-balance support: No upper extremity supported, Feet supported Sitting balance-Leahy Scale: Fair     Standing balance support: No upper extremity supported, During functional activity, Bilateral upper extremity supported Standing balance-Leahy Scale: Poor Standing balance comment: Could static stand without UE support at times but RW to ambulate and with unsteadiness               High Level Balance Comments: Worked on balance during session including standing feet apart EO and EC without UE support.  Pt very unsteady with EC requiring min A at time but improved with practice (started ~5 seconds improving to ~15 on third trial).  Feet together EO again 5 sec progressing to 15 sec, did not try EC.  Reaching for targets with single UE support then reaching for targets without UE support but moved targets closer. Turned in circle needed UE support and cues for focus  points            Cognition Arousal/Alertness: Awake/alert Behavior During Therapy: Impulsive Overall Cognitive Status: Difficult to assess                     Current Attention Level: Selective   Following Commands: Follows one step commands consistently Safety/Judgement: Decreased awareness of safety, Decreased awareness of deficits Awareness: Emergent Problem Solving: Slow processing          Exercises      General Comments General comments (skin integrity, edema, etc.): On RA SpO2 96%.  BP was stable with transfers supine 168/81 sit 157/85 stand 173/81. Denied dizziness or diplopia during sesion      Pertinent Vitals/Pain Pain Assessment Pain Assessment: No/denies pain    Home Living     Available Help at Discharge:  (son, dtr in Sports coach and church friends would provide support as needed after d/c from CIR) Type of Home: Other(Comment) (townhome)                  Prior Function            PT Goals (current goals can now be found in the care plan section) Progress towards PT goals: Progressing toward goals    Frequency    Min 4X/week      PT Plan Current plan remains appropriate    Co-evaluation              AM-PAC PT "6 Clicks" Mobility   Outcome Measure  Help needed turning from your back to your side while in a flat bed without using bedrails?: A Little Help needed moving from lying on your back to sitting on the side of a flat bed without using bedrails?: A Little Help needed moving to and from a bed to a chair (including a wheelchair)?: A Little Help needed standing up from a chair using your arms (e.g., wheelchair or bedside chair)?: A Little Help needed to walk in hospital room?: A Little Help needed climbing 3-5 steps with a railing? : A Lot 6 Click Score: 17    End of Session Equipment Utilized During Treatment: Gait belt Activity Tolerance: Patient tolerated treatment well Patient left: with call bell/phone within  reach;in bed;with bed alarm set Nurse Communication: Mobility status PT Visit Diagnosis: Unsteadiness on feet (R26.81);Pain     Time: 8546-2703 PT Time Calculation (min) (ACUTE ONLY): 23 min  Charges:  $Gait Training: 8-22 mins $Neuromuscular Re-education: 8-22 mins                     Abran Richard, PT Acute Rehab [facility] [place] 10/09/2022, 3:17 PM

## 2022-10-09 NOTE — TOC Initial Note (Signed)
Transition of Care [facility]) - Initial/Assessment Note    Patient Details  Name: Elizabeth Stone MRN: [id number] Date of Birth: 1946-05-20  Transition of Care [facility]) CM/SW Contact:    Verdell Carmine, RN Phone Number: 10/09/2022, 12:28 PM  Clinical Narrative:                 Melida Gimenez presented to ED with stroke like symptoms. Uses a walker at baseline, gets assistance from her son. She has a history of Menier's disease and cochlear implants, HPTN.  PT evaluation revealed recommendation for CIR, CIR is following patient. .Director is addressing concerns for Cochlear implant.   TOC will follow patient for needs, recommendations, and transitions of care.  Expected Discharge Plan: IP Rehab Facility Barriers to Discharge: Continued Medical Work up   Patient Goals and CMS Choice            Expected Discharge Plan and Services   Discharge Planning Services: CM Consult   Living arrangements for the past 2 months: [name]                                      Prior Living Arrangements/Services Living arrangements for the past 2 months: Single Family Home   Patient language and need for interpreter reviewed:: Yes        Need for Family Participation in Patient Care: Yes (Comment) Care giver support system in place?: Yes (comment)   Criminal Activity/Legal Involvement Pertinent to Current Situation/Hospitalization: No - Comment as needed  Activities of Daily Living Home Assistive Devices/Equipment: Eyeglasses, Hearing aid ADL Screening (condition at time of admission) Patient's cognitive ability adequate to safely complete daily activities?: Yes Is the patient deaf or have difficulty hearing?: Yes Does the patient have difficulty seeing, even when wearing glasses/contacts?: No (Pt has a cochlear implant) Does the patient have difficulty concentrating, remembering, or making decisions?: Yes Patient able to express need for assistance with ADLs?: No Does the  patient have difficulty dressing or bathing?: No Independently performs ADLs?: Yes (appropriate for developmental age) Does the patient have difficulty walking or climbing stairs?: Yes Weakness of Legs: Both Weakness of Arms/Hands: None  Permission Sought/Granted                  Emotional Assessment       Orientation: : Oriented to Self, Oriented to Place, Oriented to  Time, Oriented to Situation Alcohol / Substance Use: Not Applicable Psych Involvement: No (comment)  Admission diagnosis:  Stroke [facility]) [I63.9] Cerebrovascular accident (CVA), unspecified mechanism ([place]) [I63.9] Patient Active Problem List   Diagnosis Date Noted   Stroke ([place]) 10/07/2022   History of cochlear implant 11/25/2020   Gait abnormality 10/07/2020   Bilateral low back pain with bilateral sciatica 10/07/2020   Dyspnea on exertion 04/30/2020   Hallux rigidus of right foot 12/24/2019   Hammer toes of both feet 12/24/2019   Primary localized osteoarthritis of left knee 11/16/2018   S/P reverse total shoulder arthroplasty, left 01/07/2017   Bipolar I disorder with depression, severe ([place]) 10/26/2016   Substance use disorder 10/25/2016   Severe episode of recurrent major depressive disorder, without psychotic features ([place])    Impacted cerumen of left ear 03/23/2016   Primary localized osteoarthritis of right knee    Rotator cuff tear arthropathy 12/13/2014   Ulcer of right second toe ([place]) 12/21/2013   Family history of ischemic heart disease  09/27/2013   Essential hypertension, benign 09/27/2013   Mixed hyperlipidemia 09/27/2013   Arthritis    Anxiety    Depression    GERD (gastroesophageal reflux disease)    Meniere disease    Osteoporosis    Hearing impaired    PCP:  Orpah Melter, MD Pharmacy:   [address], [place] [address] Phone: [phone] Fax: [phone]     Social Determinants of  Health (SDOH) Social History: [place]: No Food Insecurity (10/09/2022)  Housing: Low Risk  (10/09/2022)  Transportation Needs: Unmet Transportation Needs (10/09/2022)  Utilities: Not At Risk (10/09/2022)  Tobacco Use: Medium Risk (10/09/2022)   SDOH Interventions:     Readmission Risk Interventions     No data to display

## 2022-10-09 NOTE — PMR Pre-admission (Signed)
PMR Admission Coordinator Pre-Admission Assessment  Patient: Elizabeth Stone is an 76 y.o., female MRN: [id number] DOB: Jul 16, 1946 Height: 5' 1" (154.9 cm) Weight: 76.8 kg  Insurance Information HMO:     PPO:      PCP:      IPA:      80/20:      OTHER:  PRIMARY: Blue Medicare      Policy#: [id number]      Subscriber: pt CM Name: Larene Beach      Phone#: [phone]     Fax#: [phone] Pre-Cert#: [id number]      Employer:  Manuela Schwartz with [facility] [employer] [facility] [employer] [facility] Medicare called and gave approval for admit on 10/10/22. Will need to call for actual auth dates Tuesday Benefits:  Phone #: [phone]     Name: 12/22 Eff. Date: 10/19/21     Deduct: does not have one      Out of Pocket Max: $3,950 ($303/97 met)      Life Max: n/a  CIR: $335 per day copay for days 1-5      SNF: $0 copay per day for days 1-20, $196 per day copay for days 21-60. $0 copay for days 61-100. Maximum of 100 days per benefit period  Outpatient: $10 copay per visitHome  Health: 100%       DME: 80%     Co-Pay: 20% Providers: in network  SECONDARY: none  Financial Counselor:       Phone#:   The Engineer, petroleum" for patients in Inpatient Rehabilitation Facilities with attached "Privacy Act [place] Records" was provided and verbally reviewed with: Patient and Family  Emergency Contact Information Contact Information     Name Relation Home Work Mobile   [place] Son   [phone]   Dorthy Cooler Daughter [phone]     [place] Code [phone]        Current Medical History  Patient Admitting Diagnosis: CVA  History of Present Illness: 76 year old female with history of cochlear implant, anxiety, depression, HLD, Meniere disease, fibromyalgia, bipolar disored, type 2 DM and HTN. Presented on 10/07/22 for acute onset of headache and blurred /double vision. CT head no acute process. CTA negative for LVO. TNK administered. CT imaging with no evidence of bleed but somewhat limited by her cochlear  implants. Unable to obtain MRI due to cochlear implants.  2 d echo EF 65-70%. On asa prior to admit, now on no antithrombotic until post TNK 24 hrs.  Home meds of HCTZ remains normotensive. Home meds Lipitor with LDL 77. Increase to 20 mg of Lipitor. Home meds of metformin with Hgb A1c 5.4. Pt. Seen by PT/OT who recommend CIR to assit return to PLOF.   Complete NIHSS TOTAL: 3  Patient's medical record from [facility] has been reviewed by the rehabilitation admission coordinator and physician.  Past Medical History  Past Medical History:  Diagnosis Date   Acute blood loss anemia    hx   Anxiety    Arthritis    Balance problem    Bipolar disorder ([place])    Cochlear implant in place    Cochlear implant in place    right ear   Depression    Dermatitis    on face   Dyspnea    Fibromyalgia    GERD (gastroesophageal reflux disease)    Hard of hearing    Hearing aid worn    left ear   Hearing impaired    has cochlear implant   HTN (hypertension)    Hypokalemia  Meniere disease    takes HCTZ for her inner ear problem   Mixed hyperlipidemia 09/27/2013   Osteoporosis    Pneumonia 11/2016   Pre-diabetes    Primary localized osteoarthritis of left knee 11/16/2018   Primary localized osteoarthritis of right knee    Slow transit constipation    Unsteady gait    Has the patient had major surgery during 100 days prior to admission? No  Family History   family history includes Breast cancer in her mother; Cervical cancer in her mother; Diabetes in her maternal aunt and mother; Heart disease in her father and mother.  Current Medications  Current Facility-Administered Medications:    acetaminophen (TYLENOL) tablet 650 mg, 650 mg, Oral, Q4H PRN **OR** acetaminophen (TYLENOL) 160 MG/5ML solution 650 mg, 650 mg, Per Tube, Q4H PRN **OR** acetaminophen (TYLENOL) suppository 650 mg, 650 mg, Rectal, Q4H PRN, Thressa Sheller, Erin C, NP   atorvastatin (LIPITOR) tablet 10 mg, 10 mg, Oral,  QHS, Khaliqdina, Salman, MD, 10 mg at 10/08/22 2114   buPROPion (WELLBUTRIN XL) 24 hr tablet 300 mg, 300 mg, Oral, Daily, Donnetta Simpers, MD, 300 mg at 10/09/22 0948   Chlorhexidine Gluconate Cloth 2 % PADS 6 each, 6 each, Topical, Daily, Donnetta Simpers, MD, 6 each at 10/09/22 0949   clonazePAM (KLONOPIN) tablet 1-2 mg, 1-2 mg, Oral, QHS, Khaliqdina, Alferd Patee, MD, 1 mg at 10/08/22 2114   DULoxetine (CYMBALTA) DR capsule 60 mg, 60 mg, Oral, BID, Donnetta Simpers, MD, 60 mg at 10/09/22 0948   HYDROcodone-acetaminophen (NORCO) 10-325 MG per tablet 1 tablet, 1 tablet, Oral, Q12H PRN, Donnetta Simpers, MD   HYDROmorphone (DILAUDID) tablet 2 mg, 2 mg, Oral, Q6H PRN, Donnetta Simpers, MD, 2 mg at 10/08/22 1839   lamoTRIgine (LAMICTAL) tablet 300 mg, 300 mg, Oral, Daily, Donnetta Simpers, MD, 300 mg at 10/09/22 4174   Oral care mouth rinse, 15 mL, Mouth Rinse, 4 times per day, Donnetta Simpers, MD, 15 mL at 10/09/22 1305   Oral care mouth rinse, 15 mL, Mouth Rinse, PRN, Donnetta Simpers, MD   pantoprazole (PROTONIX) EC tablet 40 mg, 40 mg, Oral, Daily, Donnetta Simpers, MD, 40 mg at 10/09/22 0948   senna-docusate (Senokot-S) tablet 1 tablet, 1 tablet, Oral, QHS PRN, Otelia Santee, NP  Patients Current Diet:  Diet Order             Diet Heart Room service appropriate? No; Fluid consistency: Thin  Diet effective now                  Precautions / Restrictions Precautions Precautions: Fall Precaution Comments: impaired vision Restrictions Weight Bearing Restrictions: No   Has the patient had 2 or more falls or a fall with injury in the past year? No  Prior Activity Level Community (5-7x/wk): Mod I with RW; drives  Prior Functional Level Self Care: Did the patient need help bathing, dressing, using the toilet or eating? Independent  Indoor Mobility: Did the patient need assistance with walking from room to room (with or without device)? Independent  Stairs: Did the  patient need assistance with internal or external stairs (with or without device)? Independent  Functional Cognition: Did the patient need help planning regular tasks such as shopping or remembering to take medications? Independent  Patient Information Are you of Hispanic, Latino/a,or Spanish origin?: A. No, not of Hispanic, Latino/a, or Spanish origin What is your race?: A. White Do you need or want an interpreter to communicate with a doctor or health care staff?: 0. No  Patient's Response To:  Health Literacy and Transportation Is the patient able to respond to health literacy and transportation needs?: Yes Health Literacy - How often do you need to have someone help you when you read instructions, pamphlets, or other written material from your doctor or pharmacy?: Never In the past 12 months, has lack of transportation kept you from medical appointments or from getting medications?: No In the past 12 months, has lack of transportation kept you from meetings, work, or from getting things needed for daily living?: No  Home Assistive Devices / [place] Devices/Equipment: Wellsite geologist, Hearing aid Home Equipment: Conservation officer, nature (2 wheels)  Prior Device Use: Indicate devices/aids used by the patient prior to current illness, exacerbation or injury? Walker  Current Functional Level Cognition  Overall Cognitive Status: Difficult to assess Current Attention Level: Selective Orientation Level: Oriented X4 Following Commands: Follows one step commands consistently Safety/Judgement: Decreased awareness of safety, Decreased awareness of deficits General Comments: Pt given instructions to throw her papaer towel in the trash. Pt verbalized understanding to throw the paper towel in the trashm however she threw it into the shower    Extremity Assessment (includes Sensation/Coordination)  Upper Extremity Assessment:  (L grip feels slightly weaker than R however both functional; hx  of frozen shoulder on R; deformities B hands from arthritis - using functionally - difficulty with fine motor at baseline)  Lower Extremity Assessment: Defer to PT evaluation    ADLs  Overall ADL's : Needs assistance/impaired Eating/Feeding: Set up, Sitting Grooming: Set up, Sitting Upper Body Bathing: Set up, Supervision/ safety, Sitting Lower Body Bathing: Minimal assistance, Sit to/from stand Upper Body Dressing : Set up, Supervision/safety, Sitting Lower Body Dressing: Minimal assistance, Sit to/from stand Lower Body Dressing Details (indicate cue type and reason): pt able to complete figure 4 with cueing, more difficulty reaching R foot compared to L Toilet Transfer: Minimal assistance, Ambulation Toileting- Clothing Manipulation and Hygiene: Minimal assistance Functional mobility during ADLs: Moderate assistance (mod A to prevent fall toward L wihtout use of AD)    Mobility  Overal bed mobility: Needs Assistance Bed Mobility: Supine to Sit, Sit to Supine Rolling: Min guard Sidelying to sit: Min assist Supine to sit: Supervision Sit to supine: Supervision General bed mobility comments: HOB slightly elevated. VC's for log roll for comfort due to back pain. Pt was able to complete with min assist for trunk elevation to full sittiong position.    Transfers  Overall transfer level: Needs assistance Equipment used: Rolling walker (2 wheels) Transfers: Sit to/from Stand Sit to Stand: Min assist General transfer comment: Assist to power up to full stand and gain/maintain standing balance.    Ambulation / Gait / Stairs / Wheelchair Mobility  Ambulation/Gait Ambulation/Gait assistance: Herbalist (Feet): 50 Feet Assistive device: Rolling walker (2 wheels) Gait Pattern/deviations: Step-through pattern, Decreased stride length, Narrow base of support, Trunk flexed General Gait Details: Slow and guarded. Pt reports diplopia when looking down the hallway. Occasional assist  required for balance and walker management. Chair follow utilized. Gait velocity: Decreased Gait velocity interpretation: <1.31 ft/sec, indicative of household ambulator    Posture / Balance Balance Overall balance assessment: Needs assistance Sitting-balance support: No upper extremity supported, Feet supported Sitting balance-Leahy Scale: Fair Standing balance support: No upper extremity supported, During functional activity Standing balance-Leahy Scale: Poor    Special needs/care consideration Cochlear implants and reads lips   Previous Home Environment  Living Arrangements: Alone  Lives With: Alone Available Help at Discharge:  (  son, dtr in Sports coach and church friends would provide support as needed after d/c from CIR) Type of Home: Other(Comment) (townhome) Home Layout: One level Home Access: Stairs to enter Entrance Stairs-Rails: Right, Left Entrance Stairs-Number of Steps: 8 Bathroom Shower/Tub: Multimedia programmer: Handicapped height Bathroom Accessibility: Yes How Accessible: Accessible via walker [place]:  (private OP SLP) Additional Comments: Life alert  Discharge Living Setting Plans for Discharge Living Setting: Patient's home, Other (Comment) (townhome) Type of Home at Discharge: Other (Comment) (townhome) Discharge Home Layout: One level Discharge Home Access: Stairs to enter Entrance Stairs-Rails: Right, Left Entrance Stairs-Number of Steps: 8 Discharge Bathroom Shower/Tub: Walk-in shower Discharge Bathroom Toilet: Handicapped height Discharge Bathroom Accessibility: Yes How Accessible: Accessible via walker Does the patient have any problems obtaining your medications?: No  Social/Family/Support Systems Patient Roles: Parent Contact Information: son, Jaynie Collins Anticipated Caregiver: son, dtr in Sports coach and church friends Anticipated Ambulance person Information: see contacts Ability/Limitations of Caregiver: son works, but his wife is  Buyer, retail Availability: 24/7 Discharge Plan Discussed with Primary Caregiver: Yes Is Caregiver In Agreement with Plan?: Yes Does Caregiver/Family have Issues with Lodging/Transportation while Pt is in Rehab?: No  Goals Patient/Family Goal for Rehab: Mod I to superivsion with PT, OT and SLP Expected length of stay: ELOS 10 to 12 days Additional Information: Cochlear implants and reads lips Pt/Family Agrees to Admission and willing to participate: Yes Program Orientation Provided & Reviewed with Pt/Caregiver Including Roles  & Responsibilities: Yes  Decrease burden of Care through IP rehab admission: n/a  Possible need for SNF placement upon discharge: not anticipated  Patient Condition: I have reviewed medical records from [facility], spoken with CM, and patient and son. I met with patient at the bedside for inpatient rehabilitation assessment.  Patient will benefit from ongoing PT, OT, and SLP, can actively participate in 3 hours of therapy a day 5 days of the week, and can make measurable gains during the admission.  Patient will also benefit from the coordinated team approach during an Inpatient Acute Rehabilitation admission.  The patient will receive intensive therapy as well as Rehabilitation physician, nursing, social worker, and care management interventions.  Due to bladder management, bowel management, safety, skin/wound care, disease management, medication administration, pain management, and patient education the patient requires 24 hour a day rehabilitation nursing.   Discharge setting and therapy post discharge at home with home health is anticipated.  Patient has agreed to participate in the Acute Inpatient Rehabilitation Program and will admit today.  Preadmission Screen Completed [id number] Jakhai Fant RN MSN with updates by Clemens Catholic, MS, CCC-SLP   Cleatrice Burke, 10/09/2022 2:12 PM ______________________________________________________________________    Discussed status with Dr. Ranell Patrick  on 10/11/22 at 26 and received approval for admission today.  Admission Coordinator: Danne Baxter RN MSN with updates by Clemens Catholic, MS, CCC-SLP  Julious Payer Audelia Acton, RN, time 930/Date 10/11/22   Assessment/Plan: Diagnosis: CVA Does the need for close, 24 hr/day Medical supervision in concert with the patient's rehab needs make it unreasonable for this patient to be served in a less intensive setting? Yes Co-Morbidities requiring supervision/potential complications: obesity, history of cochlear implant, anxiety, depression, HLD, Meniere disease, fibromyalgia Due to bladder management, bowel management, safety, skin/wound care, disease management, medication administration, pain management, and patient education, does the patient require 24 hr/day rehab nursing? Yes Does the patient require coordinated care of a physician, rehab nurse, PT, OT to address physical and functional deficits in the context of the  above medical diagnosis(es)? Yes Addressing deficits in the following areas: balance, endurance, locomotion, strength, transferring, bowel/bladder control, bathing, dressing, feeding, grooming, toileting, cognition, and psychosocial support Can the patient actively participate in an intensive therapy program of at least 3 hrs of therapy 5 days a week? Yes The potential for patient to make measurable gains while on inpatient rehab is excellent Anticipated functional outcomes upon discharge from inpatient rehab: supervision PT, supervision OT, independent SLP Estimated rehab length of stay to reach the above functional goals is: 5-7 days Anticipated discharge destination: Home 10. Overall Rehab/Functional Prognosis: excellent   MD Signature: Leeroy Cha, MD

## 2022-10-10 DIAGNOSIS — R2990 Unspecified symptoms and signs involving the nervous system: Secondary | ICD-10-CM | POA: Diagnosis not present

## 2022-10-10 LAB — CBC
HCT: 41.4 % (ref 36.0–46.0)
Hemoglobin: 14.4 g/dL (ref 12.0–15.0)
MCH: 33.6 pg (ref 26.0–34.0)
MCHC: 34.8 g/dL (ref 30.0–36.0)
MCV: 96.7 fL (ref 80.0–100.0)
Platelets: 112 10*3/uL — ABNORMAL LOW (ref 150–400)
RBC: 4.28 MIL/uL (ref 3.87–5.11)
RDW: 13 % (ref 11.5–15.5)
WBC: 6.1 10*3/uL (ref 4.0–10.5)
nRBC: 0 % (ref 0.0–0.2)

## 2022-10-10 LAB — BASIC METABOLIC PANEL
Anion gap: 10 (ref 5–15)
BUN: 8 mg/dL (ref 8–23)
CO2: 25 mmol/L (ref 22–32)
Calcium: 9.6 mg/dL (ref 8.9–10.3)
Chloride: 96 mmol/L — ABNORMAL LOW (ref 98–111)
Creatinine, Ser: 0.61 mg/dL (ref 0.44–1.00)
GFR, Estimated: >60 mL/min (ref >60)
Glucose, Bld: 125 mg/dL — ABNORMAL HIGH (ref 70–99)
Potassium: 4.6 mmol/L (ref 3.5–5.1)
Sodium: 131 mmol/L — ABNORMAL LOW (ref 135–145)

## 2022-10-10 MED ORDER — ASPIRIN 81 MG PO TBEC
81.0000 mg | DELAYED_RELEASE_TABLET | Freq: Every day | ORAL | Status: DC
  Administered 2022-10-11: 81 mg via ORAL
  Filled 2022-10-10: qty 1

## 2022-10-10 MED ORDER — HYDROCHLOROTHIAZIDE 25 MG PO TABS
25.0000 mg | ORAL_TABLET | Freq: Every day | ORAL | Status: DC
  Administered 2022-10-11: 25 mg via ORAL
  Filled 2022-10-10: qty 1

## 2022-10-10 NOTE — Progress Notes (Signed)
STROKE TEAM PROGRESS NOTE   INTERVAL HISTORY No family at bedside.  Patient lying in bed, still complaining of intermittent blurry vision and double vision as well as headache.  She stated that this that she had double vision/double vision for about a 40 minutes and then had brief headache lasting about 5 minutes.  Currently no symptoms.  She stated that she was on aspirin 81 before but discontinued by PCP.  She was okay with resumption of aspirin 81.  Vitals:   10/10/22 0633 10/10/22 0845 10/10/22 1534 10/10/22 1936  BP: (!) 140/61 (!) 145/61 (!) 144/69 (!) 154/72  Pulse: 73 79 88 87  Resp: '16 18 14 18  '$ Temp: 98.4 F (36.9 C) 98.2 F (36.8 C) 98.8 F (37.1 C) 98.4 F (36.9 C)  TempSrc: Oral   Oral  SpO2: 97% 93% 93% 95%  Weight:      Height:       CBC:  Recent Labs  Lab 10/07/22 1221 10/10/22 0230  WBC 6.8 6.1  NEUTROABS 4.3  --   HGB 13.7  13.9 14.4  HCT 38.7  41.0 41.4  MCV 94.9 96.7  PLT 158 326*   Basic Metabolic Panel:  Recent Labs  Lab 10/07/22 1221 10/10/22 0230  NA 126*  126* 131*  K 3.6  3.8 4.6  CL 91*  90* 96*  CO2 23 25  GLUCOSE 157*  157* 125*  BUN '13  16 8  '$ CREATININE 0.77  0.60 0.61  CALCIUM 9.9 9.6   Lipid Panel:  Recent Labs  Lab 10/08/22 0658  CHOL 149  TRIG 58  HDL 60  CHOLHDL 2.5  VLDL 12  LDLCALC 77   HgbA1c:  Recent Labs  Lab 10/07/22 1221  HGBA1C 5.4   Alcohol Level  Recent Labs  Lab 10/07/22 1221  ETH <10    IMAGING past 24 hours No results found.  PHYSICAL EXAM  Physical Exam  Constitutional: Appears well-developed and well-nourished pleasant elderly Caucasian lady.   Cardiovascular: Normal rate and regular rhythm.  Respiratory: Effort normal, non-labored breathing  Neuro: Mental Status: Patient is awake, alert, oriented to person, place, month, year, and situation. Speech is dysarthric Cranial Nerves: II: Visual Fields are full. Pupils are equal, round, and reactive to light.   III,IV, VI: EOMI  without ptosis. Endorses double and triple vision intermittently and with one eye covered  V: Facial sensation is symmetric to temperature VII: Facial movement is symmetric resting and smiling VIII: Hard of hearing, cochlear implant in place X: Palate elevates symmetrically XI: Shoulder shrug is symmetric. XII: Tongue protrudes midline without atrophy or fasciculations.  Motor: Tone is normal. Bulk is normal. 5/5 strength was present in all four extremities.  Sensory: Sensation is symmetric to light touch and temperature in the arms and legs.  Cerebellar: FNF and HKS inaccurate, but no gross ataxia    ASSESSMENT/PLAN Ms. MIKEILA BURGEN is a 76 y.o. female with history of HOH s/p Cochlear implant, anxiety and depression, HTN, HLD, meniere disease, fibromyalgia, bipolar disorder who presents to [facility] ED for acute onset of headache and blurred vision/double vision. Her vision is fluctuating, she states that the diplopia comes and goes. Will do one more CT scan tomorrow morning. CIR eval pending.  Stroke like symptoms s/p TNK Intermittent headache, blurry vision and double vision Code Stroke CT head No acute abnormality. Streak artifact from cochlear implant CTA head & neck No LVO Repeat head CT- no acute intracranial abnormality 2D Echo EF 65-70% LDL 77  HgbA1c 5.4 VTE prophylaxis - SCDs No antithrombotics prior to admission, now on aspirin 81. Therapy recommendations:  CIR Disposition:  pending  Hypertension Home meds:  hydrochlorothiazide, Norvasc, Cozaar Stable On home BP meds including Norvasc 5, Cozaar 100, HCTZ 25 Long-term BP goal normotensive  Hyperlipidemia Home meds:  Lipitor '10mg'$  LDL 77, goal < 70 Increase atorvastatin to '20mg'$  Continue statin at discharge  Hyponatremia, improved Baseline sodium 130s This admission sodium 126->131 EEG no seizure  Other Stroke Risk Factors Advanced Age >/= 11  Obesity, Body mass index is 31.99 kg/m., BMI >/= 30 associated with  increased stroke risk, recommend weight loss, diet and exercise as appropriate   Other Active Problems Menieres disease status post cochlear implant Fibromyalgia Bipolar disorder Anxiety  Hospital day # 3  Rosalin Hawking, MD PhD Stroke Neurology 10/10/2022 11:06 PM   To contact Stroke Continuity provider, please refer to [email or web address] After hours, contact General Neurology

## 2022-10-11 ENCOUNTER — Inpatient Hospital Stay (HOSPITAL_COMMUNITY)
Admission: RE | Admit: 2022-10-11 | Payer: Medicare Other | Source: Home / Self Care | Admitting: Physical Medicine & Rehabilitation

## 2022-10-11 ENCOUNTER — Inpatient Hospital Stay (HOSPITAL_COMMUNITY)
Admission: RE | Admit: 2022-10-11 | Discharge: 2022-10-21 | DRG: 057 | Disposition: A | Discharge status: Home Health | Payer: Medicare Other | Source: Intra-hospital | Attending: Physical Medicine & Rehabilitation | Admitting: Physical Medicine & Rehabilitation

## 2022-10-11 ENCOUNTER — Encounter (HOSPITAL_COMMUNITY): Payer: Self-pay

## 2022-10-11 ENCOUNTER — Other Ambulatory Visit: Payer: Self-pay

## 2022-10-11 DIAGNOSIS — E669 Obesity, unspecified: Secondary | ICD-10-CM | POA: Diagnosis present

## 2022-10-11 DIAGNOSIS — F0630 Mood disorder due to known physiological condition, unspecified: Secondary | ICD-10-CM

## 2022-10-11 DIAGNOSIS — E782 Mixed hyperlipidemia: Secondary | ICD-10-CM | POA: Diagnosis not present

## 2022-10-11 DIAGNOSIS — Z79899 Other long term (current) drug therapy: Secondary | ICD-10-CM | POA: Diagnosis not present

## 2022-10-11 DIAGNOSIS — I639 Cerebral infarction, unspecified: Secondary | ICD-10-CM | POA: Diagnosis not present

## 2022-10-11 DIAGNOSIS — Z7984 Long term (current) use of oral hypoglycemic drugs: Secondary | ICD-10-CM

## 2022-10-11 DIAGNOSIS — R2990 Unspecified symptoms and signs involving the nervous system: Secondary | ICD-10-CM | POA: Diagnosis present

## 2022-10-11 DIAGNOSIS — R519 Headache, unspecified: Secondary | ICD-10-CM

## 2022-10-11 DIAGNOSIS — E871 Hypo-osmolality and hyponatremia: Secondary | ICD-10-CM | POA: Diagnosis present

## 2022-10-11 DIAGNOSIS — R7303 Prediabetes: Secondary | ICD-10-CM | POA: Diagnosis present

## 2022-10-11 DIAGNOSIS — M810 Age-related osteoporosis without current pathological fracture: Secondary | ICD-10-CM | POA: Diagnosis present

## 2022-10-11 DIAGNOSIS — Z96612 Presence of left artificial shoulder joint: Secondary | ICD-10-CM | POA: Diagnosis not present

## 2022-10-11 DIAGNOSIS — I10 Essential (primary) hypertension: Secondary | ICD-10-CM | POA: Diagnosis present

## 2022-10-11 DIAGNOSIS — K5901 Slow transit constipation: Secondary | ICD-10-CM | POA: Diagnosis not present

## 2022-10-11 DIAGNOSIS — Z7982 Long term (current) use of aspirin: Secondary | ICD-10-CM | POA: Diagnosis not present

## 2022-10-11 DIAGNOSIS — H8109 Meniere's disease, unspecified ear: Secondary | ICD-10-CM | POA: Diagnosis not present

## 2022-10-11 DIAGNOSIS — Z96611 Presence of right artificial shoulder joint: Secondary | ICD-10-CM | POA: Diagnosis present

## 2022-10-11 DIAGNOSIS — I6300 Cerebral infarction due to thrombosis of unspecified precerebral artery: Secondary | ICD-10-CM

## 2022-10-11 DIAGNOSIS — H538 Other visual disturbances: Secondary | ICD-10-CM | POA: Diagnosis present

## 2022-10-11 DIAGNOSIS — R5381 Other malaise: Secondary | ICD-10-CM | POA: Diagnosis present

## 2022-10-11 DIAGNOSIS — M797 Fibromyalgia: Secondary | ICD-10-CM | POA: Diagnosis present

## 2022-10-11 DIAGNOSIS — E876 Hypokalemia: Secondary | ICD-10-CM | POA: Diagnosis not present

## 2022-10-11 DIAGNOSIS — Z87891 Personal history of nicotine dependence: Secondary | ICD-10-CM | POA: Diagnosis not present

## 2022-10-11 DIAGNOSIS — Z803 Family history of malignant neoplasm of breast: Secondary | ICD-10-CM | POA: Diagnosis not present

## 2022-10-11 DIAGNOSIS — I63011 Cerebral infarction due to thrombosis of right vertebral artery: Secondary | ICD-10-CM

## 2022-10-11 DIAGNOSIS — K219 Gastro-esophageal reflux disease without esophagitis: Secondary | ICD-10-CM | POA: Diagnosis not present

## 2022-10-11 DIAGNOSIS — G8929 Other chronic pain: Secondary | ICD-10-CM | POA: Diagnosis not present

## 2022-10-11 DIAGNOSIS — Z833 Family history of diabetes mellitus: Secondary | ICD-10-CM

## 2022-10-11 DIAGNOSIS — Z96653 Presence of artificial knee joint, bilateral: Secondary | ICD-10-CM | POA: Diagnosis present

## 2022-10-11 DIAGNOSIS — Z8049 Family history of malignant neoplasm of other genital organs: Secondary | ICD-10-CM | POA: Diagnosis not present

## 2022-10-11 DIAGNOSIS — I69328 Other speech and language deficits following cerebral infarction: Principal | ICD-10-CM

## 2022-10-11 DIAGNOSIS — F319 Bipolar disorder, unspecified: Secondary | ICD-10-CM | POA: Diagnosis not present

## 2022-10-11 DIAGNOSIS — Z8249 Family history of ischemic heart disease and other diseases of the circulatory system: Secondary | ICD-10-CM

## 2022-10-11 DIAGNOSIS — H8103 Meniere's disease, bilateral: Secondary | ICD-10-CM | POA: Diagnosis present

## 2022-10-11 DIAGNOSIS — Z91048 Other nonmedicinal substance allergy status: Secondary | ICD-10-CM | POA: Diagnosis not present

## 2022-10-11 DIAGNOSIS — M545 Low back pain, unspecified: Secondary | ICD-10-CM | POA: Diagnosis present

## 2022-10-11 DIAGNOSIS — F419 Anxiety disorder, unspecified: Secondary | ICD-10-CM | POA: Diagnosis present

## 2022-10-11 DIAGNOSIS — G629 Polyneuropathy, unspecified: Secondary | ICD-10-CM | POA: Diagnosis present

## 2022-10-11 DIAGNOSIS — Z6831 Body mass index (BMI) 31.0-31.9, adult: Secondary | ICD-10-CM

## 2022-10-11 LAB — CBC
HCT: 39.1 % (ref 36.0–46.0)
Hemoglobin: 13.2 g/dL (ref 12.0–15.0)
MCH: 33.2 pg (ref 26.0–34.0)
MCHC: 33.8 g/dL (ref 30.0–36.0)
MCV: 98.2 fL (ref 80.0–100.0)
Platelets: 145 10*3/uL — ABNORMAL LOW (ref 150–400)
RBC: 3.98 MIL/uL (ref 3.87–5.11)
RDW: 13 % (ref 11.5–15.5)
WBC: 6.6 10*3/uL (ref 4.0–10.5)
nRBC: 0 % (ref 0.0–0.2)

## 2022-10-11 LAB — BASIC METABOLIC PANEL
Anion gap: 5 (ref 5–15)
BUN: 9 mg/dL (ref 8–23)
CO2: 27 mmol/L (ref 22–32)
Calcium: 9.6 mg/dL (ref 8.9–10.3)
Chloride: 98 mmol/L (ref 98–111)
Creatinine, Ser: 0.65 mg/dL (ref 0.44–1.00)
GFR, Estimated: >60 mL/min (ref >60)
Glucose, Bld: 108 mg/dL — ABNORMAL HIGH (ref 70–99)
Potassium: 4 mmol/L (ref 3.5–5.1)
Sodium: 130 mmol/L — ABNORMAL LOW (ref 135–145)

## 2022-10-11 MED ORDER — ACETAMINOPHEN 160 MG/5ML PO SOLN: 650.0000 mg | ORAL | Status: DC | PRN

## 2022-10-11 MED ORDER — HYDROCHLOROTHIAZIDE 25 MG PO TABS
25.0000 mg | ORAL_TABLET | Freq: Every day | ORAL | Status: DC
  Administered 2022-10-12 – 2022-10-21 (×10): 25 mg via ORAL
  Filled 2022-10-11 (×10): qty 1

## 2022-10-11 MED ORDER — ACETAMINOPHEN 325 MG PO TABS
650.0000 mg | ORAL_TABLET | ORAL | Status: DC | PRN
  Administered 2022-10-13: 650 mg via ORAL
  Filled 2022-10-11: qty 2

## 2022-10-11 MED ORDER — CLONAZEPAM 0.25 MG PO TBDP: 1.0000 mg | ORAL_TABLET | Freq: Every day | ORAL | Status: DC

## 2022-10-11 MED ORDER — ASPIRIN 81 MG PO TBEC: 81.0000 mg | DELAYED_RELEASE_TABLET | Freq: Every day | ORAL | 12 refills | Status: AC

## 2022-10-11 MED ORDER — CHLORHEXIDINE GLUCONATE CLOTH 2 % EX PADS
6.0000 | MEDICATED_PAD | Freq: Every day | CUTANEOUS | Status: DC
  Administered 2022-10-12: 6 via TOPICAL

## 2022-10-11 MED ORDER — SENNOSIDES-DOCUSATE SODIUM 8.6-50 MG PO TABS
1.0000 | ORAL_TABLET | Freq: Every evening | ORAL | Status: DC | PRN
  Administered 2022-10-11 – 2022-10-20 (×8): 1 via ORAL
  Filled 2022-10-11 (×8): qty 1

## 2022-10-11 MED ORDER — LAMOTRIGINE 100 MG PO TABS
300.0000 mg | ORAL_TABLET | Freq: Every day | ORAL | Status: DC
  Administered 2022-10-12 – 2022-10-21 (×10): 300 mg via ORAL
  Filled 2022-10-11 (×10): qty 3

## 2022-10-11 MED ORDER — BUPROPION HCL ER (XL) 300 MG PO TB24
300.0000 mg | ORAL_TABLET | Freq: Every day | ORAL | Status: DC
  Administered 2022-10-12 – 2022-10-21 (×10): 300 mg via ORAL
  Filled 2022-10-11 (×10): qty 1

## 2022-10-11 MED ORDER — ATORVASTATIN CALCIUM 10 MG PO TABS
10.0000 mg | ORAL_TABLET | Freq: Every day | ORAL | Status: DC
  Administered 2022-10-11: 10 mg via ORAL
  Filled 2022-10-11: qty 1

## 2022-10-11 MED ORDER — AMLODIPINE BESYLATE 5 MG PO TABS
5.0000 mg | ORAL_TABLET | Freq: Every day | ORAL | Status: DC
  Administered 2022-10-12 – 2022-10-21 (×10): 5 mg via ORAL
  Filled 2022-10-11 (×10): qty 1

## 2022-10-11 MED ORDER — DULOXETINE HCL 30 MG PO CPEP
60.0000 mg | ORAL_CAPSULE | Freq: Two times a day (BID) | ORAL | Status: DC
  Administered 2022-10-11 – 2022-10-21 (×20): 60 mg via ORAL
  Filled 2022-10-11 (×21): qty 2

## 2022-10-11 MED ORDER — PANTOPRAZOLE SODIUM 40 MG PO TBEC
40.0000 mg | DELAYED_RELEASE_TABLET | Freq: Every day | ORAL | Status: DC
  Administered 2022-10-12 – 2022-10-21 (×10): 40 mg via ORAL
  Filled 2022-10-11 (×10): qty 1

## 2022-10-11 MED ORDER — DOCUSATE SODIUM 100 MG PO CAPS
100.0000 mg | ORAL_CAPSULE | Freq: Every day | ORAL | Status: DC | PRN
  Administered 2022-10-11 – 2022-10-20 (×7): 100 mg via ORAL
  Filled 2022-10-11 (×7): qty 1

## 2022-10-11 MED ORDER — HYDROCODONE-ACETAMINOPHEN 10-325 MG PO TABS
1.0000 | ORAL_TABLET | Freq: Two times a day (BID) | ORAL | Status: DC | PRN
  Administered 2022-10-13 – 2022-10-21 (×6): 1 via ORAL
  Filled 2022-10-11 (×7): qty 1

## 2022-10-11 MED ORDER — LOSARTAN POTASSIUM 50 MG PO TABS
100.0000 mg | ORAL_TABLET | Freq: Every day | ORAL | Status: DC
  Administered 2022-10-12 – 2022-10-21 (×10): 100 mg via ORAL
  Filled 2022-10-11 (×10): qty 2

## 2022-10-11 MED ORDER — HYDROMORPHONE HCL 2 MG PO TABS
2.0000 mg | ORAL_TABLET | Freq: Four times a day (QID) | ORAL | Status: DC | PRN
  Administered 2022-10-14 – 2022-10-20 (×10): 2 mg via ORAL
  Filled 2022-10-11 (×11): qty 1

## 2022-10-11 MED ORDER — ASPIRIN 81 MG PO TBEC
81.0000 mg | DELAYED_RELEASE_TABLET | Freq: Every day | ORAL | Status: DC
  Administered 2022-10-12 – 2022-10-21 (×10): 81 mg via ORAL
  Filled 2022-10-11 (×10): qty 1

## 2022-10-11 MED ORDER — CLONAZEPAM 0.25 MG PO TBDP
1.0000 mg | ORAL_TABLET | Freq: Every day | ORAL | Status: DC
  Administered 2022-10-11 – 2022-10-20 (×10): 1 mg via ORAL
  Filled 2022-10-11 (×10): qty 4

## 2022-10-11 MED ORDER — ACETAMINOPHEN 650 MG RE SUPP: 650.0000 mg | RECTAL | Status: DC | PRN

## 2022-10-11 NOTE — H&P (Signed)
Physical Medicine and Rehabilitation Admission H&P    CC: CVA  HPI: [name] is a 76 year old woman who was admitted with stroke like symptoms s/p TNK who has continued impaired speech, intermittent headache, blurry vision, and diplopia. Admitting to CIR on 12/24 with above deficits.     ROS +blurry vision Past Medical History:  Diagnosis Date   Acute blood loss anemia    hx   Anxiety    Arthritis    Balance problem    Bipolar disorder (HCC)    Cochlear implant in place    Cochlear implant in place    right ear   Depression    Dermatitis    on face   Dyspnea    Fibromyalgia    GERD (gastroesophageal reflux disease)    Hard of hearing    Hearing aid worn    left ear   Hearing impaired    has cochlear implant   HTN (hypertension)    Hypokalemia    Meniere disease    takes HCTZ for her inner ear problem   Mixed hyperlipidemia 09/27/2013   Osteoporosis    Pneumonia 11/2016   Pre-diabetes    Primary localized osteoarthritis of left knee 11/16/2018   Primary localized osteoarthritis of right knee    Slow transit constipation    Unsteady gait    Past Surgical History:  Procedure Laterality Date   ABDOMINAL HYSTERECTOMY  1986   bladder tack  2007   COCHLEAR IMPLANT     2007   COLONOSCOPY     COLONOSCOPY     HAMMER TOE SURGERY  2014   left foot   REVERSE SHOULDER ARTHROPLASTY Right 12/13/2014   dr Tamera Punt   REVERSE SHOULDER ARTHROPLASTY Right 12/13/2014   Procedure: REVERSE SHOULDER ARTHROPLASTY rt;  Surgeon: Nita Sells, MD;  Location: [facility];  Service: Orthopedics;  Laterality: Right;  Right reverse total shoulder replacement   REVERSE SHOULDER ARTHROPLASTY Left 01/07/2017   Procedure: REVERSE LEFT TOTAL SHOULDER ARTHROPLASTY;  Surgeon: Tania Ade, MD;  Location: [facility];  Service: Orthopedics;  Laterality: Left;  Left reveres total shoulder arthroplasty   TOTAL KNEE ARTHROPLASTY Right 12/16/2015   Procedure: TOTAL KNEE ARTHROPLASTY;  Surgeon:  Elsie Saas, MD;  Location: [facility];  Service: Orthopedics;  Laterality: Right;   TOTAL KNEE ARTHROPLASTY Left 11/28/2018   Procedure: LEFT TOTAL KNEE ARTHROPLASTY;  Surgeon: Elsie Saas, MD;  Location: [facility];  Service: Orthopedics;  Laterality: Left;   Family History  Problem Relation Age of Onset   Heart disease Mother    Breast cancer Mother    Cervical cancer Mother    Diabetes Mother    Heart disease Father    Diabetes Maternal Aunt    Colon cancer Neg Hx    Esophageal cancer Neg Hx    Rectal cancer Neg Hx    Stomach cancer Neg Hx    Liver cancer Neg Hx    Pancreatic cancer Neg Hx    Social History:  reports that she quit smoking about 29 years ago. She has a 5.75 pack-year smoking history. She has never used smokeless tobacco. She reports current alcohol use. She reports that she does not use drugs. Allergies:  Allergies  Allergen Reactions   Adhesive [Tape] Rash and Other (See Comments)    PT STATED ALLERGY TO TAPE, NOT LATEX GLOVES AS PREVIOUSLY DOCUMENTED (Pt prefers paper tape.)   Medications Prior to Admission  Medication Sig Dispense Refill   acetaminophen (TYLENOL)  500 MG tablet Take 1,000 mg by mouth at bedtime.     amLODipine (NORVASC) 5 MG tablet Take 5 mg by mouth daily.     Ascorbic Acid (VITAMIN C) 1000 MG tablet 1 tablet Orally Once a day     Asenapine Maleate 10 MG SUBL Place 1 tablet under the tongue at bedtime.     [START ON 10/12/2022] aspirin EC 81 MG tablet Take 1 tablet (81 mg total) by mouth daily. Swallow whole. 30 tablet 12   atorvastatin (LIPITOR) 10 MG tablet Take 10 mg by mouth at bedtime.      buPROPion (WELLBUTRIN XL) 300 MG 24 hr tablet Take 300 mg by mouth daily.     clonazePAM (KLONOPIN) 1 MG tablet Take 1-2 mg by mouth at bedtime.     DULoxetine (CYMBALTA) 60 MG capsule Take 60 mg by mouth 2 (two) times daily.      esomeprazole (NEXIUM) 20 MG capsule Take 20 mg by mouth daily.     gabapentin (NEURONTIN) 300 MG capsule Take 300 mg by mouth  at bedtime.     hydrochlorothiazide (HYDRODIURIL) 25 MG tablet Take 25 mg by mouth Daily.      HYDROcodone-acetaminophen (NORCO) 10-325 MG tablet Take 1 tablet by mouth every 12 (twelve) hours as needed for severe pain.     lamoTRIgine (LAMICTAL) 150 MG tablet Take 300 mg by mouth at bedtime.     losartan (COZAAR) 100 MG tablet Take 100 mg by mouth daily.     meloxicam (MOBIC) 15 MG tablet Take 15 mg by mouth daily as needed (inflammation).     metFORMIN (GLUCOPHAGE-XR) 750 MG 24 hr tablet Take 750 mg by mouth every evening.     montelukast (SINGULAIR) 10 MG tablet Take 10 mg by mouth at bedtime.     VITAMIN D PO Take 1,000 Units by mouth daily.        Home: Home Living Family/patient expects to be discharged to:: Private residence Living Arrangements: Alone   Functional History:    Functional Status:  Mobility:          ADL:    Cognition: Cognition Orientation Level: Oriented X4    Physical Exam: There were no vitals taken for this visit. Physical Exam Gen: no distress, normal appearing HEENT: oral mucosa pink and moist, NCAT Cardio: Reg rate Chest: normal effort, normal rate of breathing Abd: soft, non-distended Ext: no edema Psych: pleasant, normal affect Skin: intact Neuro: Alert and oriented x3  Musculoskeletal: 5/5 strength throughout  Results for orders placed or performed during the hospital encounter of 10/07/22 (from the past 48 hour(s))  CBC     Status: Abnormal   Collection Time: 10/10/22  2:30 AM  Result Value Ref Range   WBC 6.1 4.0 - 10.5 K/uL   RBC 4.28 3.87 - 5.11 MIL/uL   Hemoglobin 14.4 12.0 - 15.0 g/dL   HCT 41.4 36.0 - 46.0 %   MCV 96.7 80.0 - 100.0 fL   MCH 33.6 26.0 - 34.0 pg   MCHC 34.8 30.0 - 36.0 g/dL   RDW 13.0 11.5 - 15.5 %   Platelets 112 (L) 150 - 400 K/uL   nRBC 0.0 0.0 - 0.2 %    Comment: Performed at [facility], [place] [address] [place], [place] [address]  Basic metabolic panel     Status: Abnormal   Collection  Time: 10/10/22  2:30 AM  Result Value Ref Range   Sodium 131 (L) 135 - 145 mmol/L  Potassium 4.6 3.5 - 5.1 mmol/L    Comment: HEMOLYSIS AT THIS LEVEL MAY AFFECT RESULT   Chloride 96 (L) 98 - 111 mmol/L   CO2 25 22 - 32 mmol/L   Glucose, Bld 125 (H) 70 - 99 mg/dL    Comment: Glucose reference range applies only to samples taken after fasting for at least 8 hours.   BUN 8 8 - 23 mg/dL   Creatinine, Ser 0.61 0.44 - 1.00 mg/dL   Calcium 9.6 8.9 - 10.3 mg/dL   GFR, Estimated >60 >60 mL/min    Comment: (NOTE) Calculated using the CKD-EPI Creatinine Equation (2021)    Anion gap 10 5 - 15    Comment: Performed at [facility] [address], [place], [place] [address]  CBC     Status: Abnormal   Collection Time: 10/11/22  2:46 AM  Result Value Ref Range   WBC 6.6 4.0 - 10.5 K/uL   RBC 3.98 3.87 - 5.11 MIL/uL   Hemoglobin 13.2 12.0 - 15.0 g/dL   HCT 39.1 36.0 - 46.0 %   MCV 98.2 80.0 - 100.0 fL   MCH 33.2 26.0 - 34.0 pg   MCHC 33.8 30.0 - 36.0 g/dL   RDW 13.0 11.5 - 15.5 %   Platelets 145 (L) 150 - 400 K/uL   nRBC 0.0 0.0 - 0.2 %    Comment: Performed at [facility], [place] [address] [place], [place] [address]  Basic metabolic panel     Status: Abnormal   Collection Time: 10/11/22  2:46 AM  Result Value Ref Range   Sodium 130 (L) 135 - 145 mmol/L   Potassium 4.0 3.5 - 5.1 mmol/L   Chloride 98 98 - 111 mmol/L   CO2 27 22 - 32 mmol/L   Glucose, Bld 108 (H) 70 - 99 mg/dL    Comment: Glucose reference range applies only to samples taken after fasting for at least 8 hours.   BUN 9 8 - 23 mg/dL   Creatinine, Ser 0.65 0.44 - 1.00 mg/dL   Calcium 9.6 8.9 - 10.3 mg/dL   GFR, Estimated >60 >60 mL/min    Comment: (NOTE) Calculated using the CKD-EPI Creatinine Equation (2021)    Anion gap 5 5 - 15    Comment: Performed at [facility] [address], [place], [place] [address]   No results found.    There were no vitals taken for this visit.  Medical Problem  List and Plan: 1. Functional deficits secondary to stroke like symptoms s/p TNK  -patient may shower  -ELOS/Goals: 5-7 days S  Admit to CIR 2.  Antithrombotics: -DVT/anticoagulation:  Mechanical: Sequential compression devices, entire leg Bilateral lower extremities  -antiplatelet therapy: aspirin '81mg'$  daily 3. Neuropathy: continue cymbalta '60mg'$  BID.  4. Anxiety: decrease klonopin to '1mg'$  HS 5. Neuropsych/cognition: This patient is capable of making decisions on her own behalf. 6. HTN: continue cozaar '100mg'$  daily, HCTZ '25mg'$  daily, and Norvasc '5mg'$  dauly.  7. HLD: atorvastain increased to '20mg'$  as LDL is 77 and goal is <70 8. Hyponatremia: Na improved to baseline, monitor weekly 9. 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 Fibromyalgia: on dilaudid, discuss current use of dilaudid   I have personally performed a face to face diagnostic evaluation, including, but not limited to relevant history and physical exam findings, of this patient and developed relevant assessment and plan.  Additionally, I have reviewed and concur with the  physician assistant's documentation above.   Izora Ribas, MD 10/11/2022

## 2022-10-11 NOTE — Discharge Summary (Addendum)
Stroke Discharge Summary  Patient ID: Elizabeth Stone   MRN: [id number]      DOB: September 16, 1946  Date of Admission: 10/07/2022 Date of Discharge: 10/11/2022  Attending Physician:  Stroke, Md, MD, Stroke MD Consultant(s):   None Patient's PCP:  Orpah Melter, MD  Discharge Diagnoses:  Stroke like symptoms status post TNK with intermittent headache, blurry vision and diplopia  Other Principal Problems:   HTN   HLD   Hyponatremia    Obesity    Menieres disease status post cochlear implant   Fibromyalgia   Bipolar disorder   Anxiety   Medications to be continued on Rehab Allergies as of 10/11/2022       Reactions   Adhesive [tape] Rash, Other (See Comments)   PT STATED ALLERGY TO TAPE, NOT LATEX GLOVES AS PREVIOUSLY DOCUMENTED (Pt prefers paper tape.)        Medication List     STOP taking these medications    albuterol 108 (90 Base) MCG/ACT inhaler Commonly known as: VENTOLIN HFA   potassium chloride SA 20 MEQ tablet Commonly known as: KLOR-CON M   valACYclovir 1000 MG tablet Commonly known as: VALTREX   VITAMIN C PO       TAKE these medications    acetaminophen 500 MG tablet Commonly known as: TYLENOL Take 1,000 mg by mouth at bedtime.   amLODipine 5 MG tablet Commonly known as: NORVASC Take 5 mg by mouth daily.   Asenapine Maleate 10 MG Subl Place 1 tablet under the tongue at bedtime.   aspirin EC 81 MG tablet Take 1 tablet (81 mg total) by mouth daily. Swallow whole. Start taking on: October 12, 2022   atorvastatin 10 MG tablet Commonly known as: LIPITOR Take 10 mg by mouth at bedtime.   buPROPion 300 MG 24 hr tablet Commonly known as: WELLBUTRIN XL Take 300 mg by mouth daily.   clonazePAM 1 MG tablet Commonly known as: KLONOPIN Take 1-2 mg by mouth at bedtime.   DULoxetine 60 MG capsule Commonly known as: CYMBALTA Take 60 mg by mouth 2 (two) times daily.   esomeprazole 20 MG capsule Commonly known as: NEXIUM Take 20 mg by  mouth daily.   gabapentin 300 MG capsule Commonly known as: NEURONTIN Take 300 mg by mouth at bedtime.   hydrochlorothiazide 25 MG tablet Commonly known as: HYDRODIURIL Take 25 mg by mouth Daily.   HYDROcodone-acetaminophen 10-325 MG tablet Commonly known as: NORCO Take 1 tablet by mouth every 12 (twelve) hours as needed for severe pain.   lamoTRIgine 150 MG tablet Commonly known as: LAMICTAL Take 300 mg by mouth at bedtime.   losartan 100 MG tablet Commonly known as: COZAAR Take 100 mg by mouth daily.   meloxicam 15 MG tablet Commonly known as: MOBIC Take 15 mg by mouth daily as needed (inflammation).   metFORMIN 750 MG 24 hr tablet Commonly known as: GLUCOPHAGE-XR Take 750 mg by mouth every evening.   montelukast 10 MG tablet Commonly known as: SINGULAIR Take 10 mg by mouth at bedtime.   vitamin C 1000 MG tablet 1 tablet Orally Once a day   VITAMIN D PO Take 1,000 Units by mouth daily.        LABORATORY STUDIES CBC    Component Value Date/Time   WBC 6.6 10/11/2022 0246   RBC 3.98 10/11/2022 0246   HGB 13.2 10/11/2022 0246   HCT 39.1 10/11/2022 0246   PLT 145 (L) 10/11/2022 0246   MCV 98.2 10/11/2022  0246   MCH 33.2 10/11/2022 0246   MCHC 33.8 10/11/2022 0246   RDW 13.0 10/11/2022 0246   LYMPHSABS 1.6 10/07/2022 1221   MONOABS 0.7 10/07/2022 1221   EOSABS 0.1 10/07/2022 1221   BASOSABS 0.1 10/07/2022 1221   CMP    Component Value Date/Time   NA 130 (L) 10/11/2022 0246   NA 135 (A) 01/13/2017 0000   K 4.0 10/11/2022 0246   CL 98 10/11/2022 0246   CO2 27 10/11/2022 0246   GLUCOSE 108 (H) 10/11/2022 0246   BUN 9 10/11/2022 0246   BUN 10 01/13/2017 0000   CREATININE 0.65 10/11/2022 0246   CREATININE 0.69 11/10/2019 1349   CALCIUM 9.6 10/11/2022 0246   PROT 7.0 10/07/2022 1221   ALBUMIN 3.9 10/07/2022 1221   AST 40 10/07/2022 1221   ALT 33 10/07/2022 1221   ALKPHOS 98 10/07/2022 1221   BILITOT 0.5 10/07/2022 1221   GFRNONAA >60 10/11/2022  0246   GFRAA >60 11/29/2018 0354   COAGS Lab Results  Component Value Date   INR 1.1 10/07/2022   INR 1.14 11/21/2018   INR 1.06 12/30/2016   Lipid Panel    Component Value Date/Time   CHOL 149 10/08/2022 0658   TRIG 58 10/08/2022 0658   HDL 60 10/08/2022 0658   CHOLHDL 2.5 10/08/2022 0658   VLDL 12 10/08/2022 0658   LDLCALC 77 10/08/2022 0658   HgbA1C  Lab Results  Component Value Date   HGBA1C 5.4 10/07/2022   Urinalysis    Component Value Date/Time   COLORURINE YELLOW 04/20/2021 0904   APPEARANCEUR CLEAR 04/20/2021 0904   LABSPEC 1.011 04/20/2021 0904   PHURINE 7.0 04/20/2021 0904   GLUCOSEU NEGATIVE 04/20/2021 0904   HGBUR NEGATIVE 04/20/2021 0904   BILIRUBINUR NEGATIVE 04/20/2021 0904   KETONESUR NEGATIVE 04/20/2021 0904   PROTEINUR NEGATIVE 04/20/2021 0904   UROBILINOGEN 1.0 12/04/2014 1517   NITRITE NEGATIVE 04/20/2021 0904   LEUKOCYTESUR NEGATIVE 04/20/2021 0904   Urine Drug Screen     Component Value Date/Time   LABOPIA POSITIVE (A) 10/22/2016 1801   COCAINSCRNUR NONE DETECTED 10/22/2016 1801   LABBENZ NONE DETECTED 10/22/2016 1801   AMPHETMU NONE DETECTED 10/22/2016 1801   THCU POSITIVE (A) 10/22/2016 1801   LABBARB NONE DETECTED 10/22/2016 1801    Alcohol Level    Component Value Date/Time   ETH <10 10/07/2022 1221     SIGNIFICANT DIAGNOSTIC STUDIES CT HEAD WO CONTRAST (5MM)  Result Date: 10/09/2022 CLINICAL DATA:  24 hours post TNK EXAM: CT HEAD WITHOUT CONTRAST TECHNIQUE: Contiguous axial images were obtained from the base of the skull through the vertex without intravenous contrast. RADIATION DOSE REDUCTION: This exam was performed according to the departmental dose-optimization program which includes automated exposure control, adjustment of the mA and/or kV according to patient size and/or use of iterative reconstruction technique. COMPARISON:  Yesterday FINDINGS: Brain: No evidence of acute infarction, hemorrhage, hydrocephalus,  extra-axial collection or mass lesion/mass effect. Chronic small vessel ischemic type change in the cerebral white matter. Streak artifact which is unavoidable from cochlear implant Vascular: No hyperdense vessel or unexpected calcification. Skull: Normal. Negative for fracture or focal lesion. Sinuses/Orbits: Cochlear implant on the right. IMPRESSION: Stable head CT.  No hemorrhage or visible gray matter infarct. Electronically Signed   By: Jorje Guild M.D.   On: 10/09/2022 04:27   EEG adult  Result Date: 10/08/2022 Lora Havens, MD     10/08/2022  4:43 PM Patient Name: Elizabeth Stone MRN: [id number] Epilepsy  Attending: Lora Havens Referring Physician/Provider: Donnetta Simpers, MD Date: 10/08/2022 Duration: 24.26 mins Patient history: 76yo F presents with acute onset diplopia, blurred vision, vertigo and headaches. EEg to evaluate for seizure Level of alertness: Awake AEDs during EEG study: LTG, Clonazepam Technical aspects: This EEG study was done with scalp electrodes positioned according to the 10-20 International system of electrode placement. Electrical activity was reviewed with band pass filter of 1-'70Hz'$ , sensitivity of 7 uV/mm, display speed of 55m/sec with a '60Hz'$  notched filter applied as appropriate. EEG data were recorded continuously and digitally stored.  Video monitoring was available and reviewed as appropriate. Description: The posterior dominant rhythm consists of 8-9 Hz activity of moderate voltage (25-35 uV) seen predominantly in posterior head regions, symmetric and reactive to eye opening and eye closing. Hyperventilation and photic stimulation were not performed.   IMPRESSION: This study is within normal limits. No seizures or epileptiform discharges were seen throughout the recording. A normal interictal EEG does not exclude the diagnosis of epilepsy. PLora Havens  ECHOCARDIOGRAM COMPLETE  Result Date: 10/08/2022    ECHOCARDIOGRAM REPORT   Patient Name:   Elizabeth DAPONTEDate of Exam: 10/08/2022 Medical Rec #:  [id number]   Height:       61.0 in Accession #:    [id number]  Weight:       169.3 lb Date of Birth:  [birth year]   BSA:          1.760 m Patient Age:    89years     BP:           138/61 mmHg Patient Gender: F            HR:           87 bpm. Exam Location:  Inpatient Procedure: 2D Echo, Cardiac Doppler and Color Doppler Indications:    Stroke  History:        Patient has prior history of Echocardiogram examinations, most                 recent 11/18/2018. Stroke, Signs/Symptoms:Dyspnea; Risk                 Factors:Hypertension and Dyslipidemia. Substance abuse.  Sonographer:    TRoseanna RainbowRDCS Referring Phys: [id number] GivensLPalos S[facility] Sonographer Comments: Technically difficult study due to poor echo windows and patient is obese. Image acquisition challenging due to patient body habitus. IMPRESSIONS  1. Left ventricular ejection fraction, by estimation, is 65 to 70%. The left ventricle has normal function. The left ventricle has no regional wall motion abnormalities. Left ventricular diastolic parameters are consistent with Grade I diastolic dysfunction (impaired relaxation).  2. Right ventricular systolic function is normal. The right ventricular size is normal. Tricuspid regurgitation signal is inadequate for assessing PA pressure.  3. The mitral valve is normal in structure. No evidence of mitral valve regurgitation. No evidence of mitral stenosis.  4. The aortic valve is tricuspid. Aortic valve regurgitation is not visualized. No aortic stenosis is present.  5. The inferior vena cava is normal in size with greater than 50% respiratory variability, suggesting right atrial pressure of 3 mmHg. FINDINGS  Left Ventricle: Left ventricular ejection fraction, by estimation, is 65 to 70%. The left ventricle has normal function. The left ventricle has no regional wall motion abnormalities. The left ventricular internal cavity size was normal in size. There is  no left  ventricular hypertrophy. Left ventricular diastolic parameters are consistent with  Grade I diastolic dysfunction (impaired relaxation). Right Ventricle: The right ventricular size is normal. No increase in right ventricular wall thickness. Right ventricular systolic function is normal. Tricuspid regurgitation signal is inadequate for assessing PA pressure. Left Atrium: Left atrial size was normal in size. Right Atrium: Right atrial size was normal in size. Pericardium: There is no evidence of pericardial effusion. Mitral Valve: The mitral valve is normal in structure. No evidence of mitral valve regurgitation. No evidence of mitral valve stenosis. Tricuspid Valve: The tricuspid valve is normal in structure. Tricuspid valve regurgitation is not demonstrated. Aortic Valve: The aortic valve is tricuspid. Aortic valve regurgitation is not visualized. No aortic stenosis is present. Pulmonic Valve: The pulmonic valve was normal in structure. Pulmonic valve regurgitation is not visualized. Aorta: The aortic root is normal in size and structure. Venous: The inferior vena cava is normal in size with greater than 50% respiratory variability, suggesting right atrial pressure of 3 mmHg. IAS/Shunts: No atrial level shunt detected by color flow Doppler.  LEFT VENTRICLE PLAX 2D LVIDd:         3.80 cm     Diastology LVIDs:         2.20 cm     LV e' medial:    7.29 cm/s LV PW:         1.20 cm     LV E/e' medial:  15.6 LV IVS:        0.70 cm     LV e' lateral:   10.60 cm/s LVOT diam:     1.90 cm     LV E/e' lateral: 10.8 LV SV:         58 LV SV Index:   33 LVOT Area:     2.84 cm  LV Volumes (MOD) LV vol d, MOD A2C: 68.6 ml LV vol d, MOD A4C: 36.5 ml LV vol s, MOD A2C: 15.2 ml LV vol s, MOD A4C: 8.1 ml LV SV MOD A2C:     53.4 ml LV SV MOD A4C:     36.5 ml LV SV MOD BP:      42.3 ml RIGHT VENTRICLE             IVC RV S prime:     17.60 cm/s  IVC diam: 1.70 cm TAPSE (M-mode): 2.0 cm LEFT ATRIUM             Index        RIGHT ATRIUM           Index LA diam:        3.00 cm 1.70 cm/m   RA Area:     8.14 cm LA Vol (A2C):   8.3 ml  4.72 ml/m   RA Volume:   15.10 ml 8.58 ml/m LA Vol (A4C):   24.3 ml 13.81 ml/m LA Biplane Vol: 15.0 ml 8.52 ml/m  AORTIC VALVE LVOT Vmax:   125.00 cm/s LVOT Vmean:  78.800 cm/s LVOT VTI:    0.205 m  AORTA Ao Root diam: 2.80 cm Ao Asc diam:  2.80 cm MITRAL VALVE MV Area (PHT): 3.65 cm     SHUNTS MV Decel Time: 208 msec     Systemic VTI:  0.20 m MV E velocity: 114.00 cm/s  Systemic Diam: 1.90 cm MV A velocity: 126.00 cm/s MV E/A ratio:  0.90 Dalton McleanMD Electronically signed by Franki Monte Signature Date/Time: 10/08/2022/10:26:27 AM    Final    CT HEAD WO CONTRAST (5MM)  Result Date: 10/08/2022 CLINICAL DATA:  Neuro deficit stroke suspected EXAM: CT HEAD WITHOUT CONTRAST TECHNIQUE: Contiguous axial images were obtained from the base of the skull through the vertex without intravenous contrast. RADIATION DOSE REDUCTION: This exam was performed according to the departmental dose-optimization program which includes automated exposure control, adjustment of the mA and/or kV according to patient size and/or use of iterative reconstruction technique. COMPARISON:  CT Head 10/07/22 FINDINGS: Brain: Assessment of portions of the right parietal, occipital, and temporal lobes is limited due to streak artifact from patient's cochlear implant. Within this limitation there is no hydrocephalus hemorrhage, extra-axial fluid collection, or mass effect. No CT evidence of an acute infarct. Vascular: No hyperdense vessel or unexpected calcification. Skull: Postoperative changes from placement of a right-sided cochlear implant. Negative for fracture or focal lesion. Sinuses/Orbits: Bilateral lens replacement. There is a right-sided cochlear place. Other: None IMPRESSION: Assessment of portions of the right parietal, occipital, and temporal lobes is limited due to streak artifact from patient's cochlear implant. Within this  limitation there is no acute intracranial abnormality. Electronically Signed   By: Marin Roberts M.D.   On: 10/08/2022 09:01   CT ANGIO HEAD NECK W WO CM (CODE STROKE)  Result Date: 10/07/2022 CLINICAL DATA:  Acute neuro deficit rule out stroke EXAM: CT ANGIOGRAPHY HEAD AND NECK TECHNIQUE: Multidetector CT imaging of the head and neck was performed using the standard protocol during bolus administration of intravenous contrast. Multiplanar CT image reconstructions and MIPs were obtained to evaluate the vascular anatomy. Carotid stenosis measurements (when applicable) are obtained utilizing NASCET criteria, using the distal internal carotid diameter as the denominator. RADIATION DOSE REDUCTION: This exam was performed according to the departmental dose-optimization program which includes automated exposure control, adjustment of the mA and/or kV according to patient size and/or use of iterative reconstruction technique. CONTRAST:  72m OMNIPAQUE IOHEXOL 350 MG/ML SOLN COMPARISON:  CT head 10/07/2022 FINDINGS: CTA NECK FINDINGS Aortic arch: Atherosclerotic calcification aortic arch, mild. Proximal great vessels patent without stenosis. There is streak artifact obscuring the proximal great vessels and carotid bifurcation bilaterally due left shoulder replacement. Right carotid system: Atherosclerotic calcification right carotid bifurcation. Lumen difficult to accurately measure due to streak artifact and motion. Estimated 55% diameter stenosis proximal right internal carotid artery. Left carotid system: Atherosclerotic calcification left carotid bifurcation without significant stenosis. Vertebral arteries: Both vertebral arteries patent to the skull base without stenosis. Left vertebral artery dominant. Skeleton: Cervical spondylosis.  No acute skeletal abnormality. Other neck: Negative for mass or adenopathy in the neck. Left shoulder replacement. Upper chest: Lung apices clear bilaterally. Chronic right anterior  rib fracture. Review of the MIP images confirms the above findings CTA HEAD FINDINGS Anterior circulation: Mild atherosclerotic calcification in the cavernous carotid bilaterally. Negative for stenosis. Anterior and middle cerebral arteries patent without large vessel occlusion or flow limiting stenosis. Negative for aneurysm. Streak artifact from right cochlear implant. Posterior circulation: Both vertebral arteries patent to the basilar. PICA patent bilaterally. Basilar widely patent. Superior cerebellar and posterior cerebral arteries patent without stenosis or large vessel occlusion. Venous sinuses: Limited venous enhancement.  No focal abnormality Anatomic variants: None Review of the MIP images confirms the above findings IMPRESSION: 1. Negative for intracranial large vessel occlusion. 2. Atherosclerotic calcification right carotid bifurcation with estimated 55% diameter stenosis proximal right internal carotid artery. 3. Atherosclerotic calcification left carotid bifurcation without significant stenosis. 4. Both vertebral arteries patent to the basilar. 5. Aortic atherosclerosis. Aortic Atherosclerosis (ICD10-I70.0). Electronically Signed   By: CFranchot GalloM.D.   On: 10/07/2022  12:53   CT HEAD CODE STROKE WO CONTRAST  Result Date: 10/07/2022 CLINICAL DATA:  Code stroke.  Acute neuro deficit.  Rule out stroke EXAM: CT HEAD WITHOUT CONTRAST TECHNIQUE: Contiguous axial images were obtained from the base of the skull through the vertex without intravenous contrast. RADIATION DOSE REDUCTION: This exam was performed according to the departmental dose-optimization program which includes automated exposure control, adjustment of the mA and/or kV according to patient size and/or use of iterative reconstruction technique. COMPARISON:  CT head 03/15/2021 FINDINGS: Brain: Cochlear implant on the right causing significant streak artifact. Ventricle size normal. Patchy white matter hypodensity bilaterally is  similar to the prior study. Negative for acute infarct, hemorrhage, mass Vascular: Negative for hyperdense vessel Skull: Negative Sinuses/Orbits: Paranasal sinuses clear. Bilateral cataract extraction Other: None ASPECTS ([place] Stroke Program Early CT Score) - Ganglionic level infarction (caudate, lentiform nuclei, internal capsule, insula, M1-M3 cortex): 7 - Supraganglionic infarction (M4-M6 cortex): 3 Total score (0-10 with 10 being normal): 10 IMPRESSION: 1. No acute intracranial abnormality. 2. Aspects is 10. 3. Extensive artifact from cochlear implant on the right. 4. Code stroke imaging results were communicated on 10/07/2022 at 12:27 pm to provider [name] via [facility] text page Electronically Signed   By: Franchot Gallo M.D.   On: 10/07/2022 12:27       HISTORY OF PRESENT ILLNESS Patient with history of cochlear implant, anxiety, depression, hypertension, hyperlipidemia, Mnire's disease, fibromyalgia and bipolar disorder presented with acute onset of headache and blurred vision with intermittent diplopia.  HOSPITAL COURSE Patient's symptoms were initially thought to be due to stroke, so TNK was administered.  She was unable to have MRI due to cochlear implant, so repeat CT was performed and was negative for acute abnormality.   Stroke like symptoms s/p TNK Intermittent headache, blurry vision and double vision Code Stroke CT head No acute abnormality. Streak artifact from cochlear implant CTA head & neck No LVO Repeat head CT- no acute intracranial abnormality 2D Echo EF 65-70% LDL 77 HgbA1c 5.4 VTE prophylaxis - SCDs No antithrombotics prior to admission, now on aspirin 81. Therapy recommendations:  CIR Disposition:  pending   Hypertension Home meds:  hydrochlorothiazide, Norvasc, Cozaar Stable On home BP meds including Norvasc 5, Cozaar 100, HCTZ 25 Long-term BP goal normotensive   Hyperlipidemia Home meds:  Lipitor '10mg'$  LDL 77, goal < 70 Increase atorvastatin to  '20mg'$  Continue statin at discharge   Hyponatremia, improved Baseline sodium 130s This admission sodium 126->131 EEG no seizure   Other Stroke Risk Factors Advanced Age >/= 65  Obesity, Body mass index is 31.99 kg/m., BMI >/= 30 associated with increased stroke risk, recommend weight loss, diet and exercise as appropriate    Other Active Problems Menieres disease status post cochlear implant Fibromyalgia Bipolar disorder Anxiety  DISCHARGE EXAM Blood pressure (!) 149/59, pulse 92, temperature 98 F (36.7 C), resp. rate 18, height '5\' 1"'$  (1.549 m), weight 76.8 kg, SpO2 92 %. General: Alert, obese elderly patient in no acute distress Respiratory: Regular unlabored respirations on room air  NEURO:  Mental Status: AA&Ox3  Speech/Language: speech is without dysarthria or aphasia.  Fluency, and comprehension intact.  Cranial Nerves:  II: PERRL.  III, IV, VI: EOMI. Eyelids elevate symmetrically.  V: Sensation is intact to light touch and symmetrical to face.  VII: Smile is symmetrical.  VIII: hearing intact to voice. IX, X: Phonation is normal.  OM:VEHMCNOB shrug 5/5. XII: tongue is midline without fasciculations. Motor: 5/5 strength to all muscle groups tested.  Tone: is normal and bulk is normal Sensation- Intact to light touch bilaterally.  Coordination: FTN intact bilaterally with very mild dysmetria Gait- deferred   Discharge Diet      Diet   Diet Heart Room service appropriate? No; Fluid consistency: Thin   liquids  DISCHARGE PLAN Disposition:  Transfer to [facility] for ongoing PT, OT and ST aspirin 81 mg daily for secondary stroke prevention indefinitely Recommend ongoing stroke risk factor control by Primary Care Physician at time of discharge from inpatient rehabilitation. Follow-up PCP Orpah Melter, MD in 2 weeks following discharge from rehab. Follow-up in [facility] in 4 weeks following discharge from  rehab, office to schedule an appointment.   35 minutes were spent preparing discharge.  [place] , MSN, AGACNP-BC [facility] See Amion for schedule and pager information 10/11/2022 1:03 PM   ATTENDING NOTE: I reviewed above note and agree with the assessment and plan.   Neuro stable overnight, no acute event. On ASA and lipitor. Medically ready for CIR admission. Will follow up at [facility] in 4 weeks.   For detailed assessment and plan, please refer to above/below as I have made changes wherever appropriate.   Rosalin Hawking, MD PhD Stroke Neurology 10/11/2022 2:45 PM

## 2022-10-11 NOTE — Progress Notes (Signed)
Inpatient Rehab Admissions Coordinator:    I have a CIR bed for this Pt. And she will admit today. RN may call report to [place], [place], [place] Admissions Coordinator  [phone] ([place]) [phone] (office)

## 2022-10-11 NOTE — Progress Notes (Signed)
Occupational Therapy Treatment Patient Details Name: Elizabeth Stone MRN: [id number] DOB: 02-05-1946 Today's Date: 10/11/2022   History of present illness Pt is a 76 y/o female presenting on 12/20 with acute onset of headache and blurred/double vision. CT negative for acute process, TNK given 12/20. Repeat CT 12/21 "Assessment of portions of the R parietal, occipital, and temporal lobes is limited due to streak artifact from patient's cochlear implant. Within this limitation there is no acute intracranial abnormality". PMH includes: anxiety, bipolar disorder, fibromyalgia, HTN, cochlear implant, PNA, meniere disease.   OT comments  Patient demonstrated good progress with OT treatment. Patient able to donn slippers with min assist and performed grooming and self care tasks standing at sink with min guard assist for safety. Patient states she had less difficulty with vision on this date. Patient is expected to discharge to [facility] for continued OT to address vision, functional transfer, and self care.    Recommendations for follow up therapy are one component of a multi-disciplinary discharge planning process, led by the attending physician.  Recommendations may be updated based on patient status, additional functional criteria and insurance authorization.    Follow Up Recommendations  Acute inpatient rehab (3hours/day)     Assistance Recommended at Discharge Frequent or constant Supervision/Assistance  Patient can return home with the following  A little help with walking and/or transfers;A little help with bathing/dressing/bathroom;Assistance with cooking/housework;Help with stairs or ramp for entrance;Assist for transportation;Direct supervision/assist for financial management;Direct supervision/assist for medications management   Equipment Recommendations  Other (comment)    Recommendations for Other Services      Precautions / Restrictions Precautions Precautions: Fall Precaution Comments:  impaired vision Restrictions Weight Bearing Restrictions: No       Mobility Bed Mobility Overal bed mobility: Needs Assistance             General bed mobility comments: OOB in recliner    Transfers Overall transfer level: Needs assistance Equipment used: Rolling walker (2 wheels) Transfers: Sit to/from Stand Sit to Stand: Min assist           General transfer comment: min assist to stand from recliner and chair     Balance Overall balance assessment: Needs assistance Sitting-balance support: No upper extremity supported, Feet supported Sitting balance-Leahy Scale: Fair     Standing balance support: No upper extremity supported, During functional activity, Bilateral upper extremity supported Standing balance-Leahy Scale: Poor Standing balance comment: able to stand at sink and perform BUE activities                           ADL either performed or assessed with clinical judgement   ADL Overall ADL's : Needs assistance/impaired     Grooming: Wash/dry hands;Wash/dry face;Oral care;Brushing hair;Min guard;Standing Grooming Details (indicate cue type and reason): Patient able to locate all items on sink and demonstrated good visual scanning             Lower Body Dressing: Minimal assistance;Sitting/lateral leans Lower Body Dressing Details (indicate cue type and reason): to donn slippers                    Extremity/Trunk Assessment              Vision       Perception     Praxis      Cognition Arousal/Alertness: Awake/alert Behavior During Therapy: Impulsive Overall Cognitive Status: Difficult to assess  Current Attention Level: Selective Memory: Decreased short-term memory Following Commands: Follows one step commands consistently Safety/Judgement: Decreased awareness of safety, Decreased awareness of deficits Awareness: Emergent Problem Solving: Slow processing          Exercises       Shoulder Instructions       General Comments      Pertinent Vitals/ Pain       Pain Assessment Pain Assessment: Faces Faces Pain Scale: Hurts little more Pain Location: back Pain Descriptors / Indicators: Discomfort, Aching Pain Intervention(s): Limited activity within patient's tolerance, Monitored during session, Repositioned  Home Living                                          Prior Functioning/Environment              Frequency  Min 2X/week        Progress Toward Goals  OT Goals(current goals can now be found in the care plan section)  Progress towards OT goals: Progressing toward goals  Acute Rehab OT Goals Patient Stated Goal: go to rehab OT Goal Formulation: With patient Time For Goal Achievement: 10/22/22 Potential to Achieve Goals: Good ADL Goals Pt Will Perform Grooming: with modified independence;standing Pt Will Perform Lower Body Dressing: with modified independence;sit to/from stand Pt Will Transfer to Toilet: with modified independence;ambulating Pt Will Perform Toileting - Clothing Manipulation and hygiene: with modified independence;sit to/from stand Additional ADL Goal #1: Pt will utilize visual compensatory techniques with supervision to optimize safety during ADL routine.  Plan Discharge plan remains appropriate    Co-evaluation                 AM-PAC OT "6 Clicks" Daily Activity     Outcome Measure   Help from another person eating meals?: A Little Help from another person taking care of personal grooming?: A Little Help from another person toileting, which includes using toliet, bedpan, or urinal?: A Little Help from another person bathing (including washing, rinsing, drying)?: A Little Help from another person to put on and taking off regular upper body clothing?: A Little Help from another person to put on and taking off regular lower body clothing?: A Little 6 Click Score: 18    End of Session  Equipment Utilized During Treatment: Gait belt;Rolling walker (2 wheels)  OT Visit Diagnosis: Other abnormalities of gait and mobility (R26.89);Muscle weakness (generalized) (M62.81);Low vision, both eyes (H54.2);Other symptoms and signs involving cognitive function;Other symptoms and signs involving the nervous system (R29.898)   Activity Tolerance Patient tolerated treatment well   Patient Left in chair;with call bell/phone within reach;with chair alarm set   Nurse Communication Mobility status        Time: 2297-9892 OT Time Calculation (min): 28 min  Charges: OT General Charges $OT Visit: 1 Visit OT Treatments $Self Care/Home Management : 23-37 mins  Lodema Hong, [place]  Office [place] 10/11/2022, 1:41 PM

## 2022-10-11 NOTE — Progress Notes (Signed)
Inpatient Rehabilitation Admission Medication Review by a Pharmacist  A complete drug regimen review was completed for this patient to identify any potential clinically significant medication issues.  High Risk Drug Classes Is patient taking? Indication by Medication  Antipsychotic Yes Lamictal - bipolar  Anticoagulant No   Antibiotic No   Opioid Yes Vicodin - pain  Antiplatelet Yes ASA - CVA  Hypoglycemics/insulin No   Vasoactive Medication Yes Amlodipine/HCTZ/losartan - HTN  Chemotherapy No   Other Yes Atorvastatin - HLD Buproprion/Cymbalta - depression Clonazapam - anixiety Protonix - GERD     Type of Medication Issue Identified Description of Issue Recommendation(s)  Drug Interaction(s) (clinically significant)     Duplicate Therapy     Allergy     No Medication Administration End Date     Incorrect Dose  Dc note said to increase ator to '20mg'$  Message MD in AM  Additional Drug Therapy Needed     Significant med changes from prior encounter (inform family/care partners about these prior to discharge).    Other  Gabapentin, metformin, montelukast, vit c, asenapine  Consider resume at dc if needed    Clinically significant medication issues were identified that warrant physician communication and completion of prescribed/recommended actions by midnight of the next day:  Yes  Name of provider notified for urgent issues identified: CIR team  Provider Method of Notification: secure chat    Pharmacist comments:   Time spent performing this drug regimen review (minutes):  20

## 2022-10-12 DIAGNOSIS — R2990 Unspecified symptoms and signs involving the nervous system: Secondary | ICD-10-CM | POA: Diagnosis not present

## 2022-10-12 DIAGNOSIS — H8109 Meniere's disease, unspecified ear: Secondary | ICD-10-CM | POA: Diagnosis not present

## 2022-10-12 LAB — CBC
HCT: 40.2 % (ref 36.0–46.0)
Hemoglobin: 14.2 g/dL (ref 12.0–15.0)
MCH: 34.1 pg — ABNORMAL HIGH (ref 26.0–34.0)
MCHC: 35.3 g/dL (ref 30.0–36.0)
MCV: 96.6 fL (ref 80.0–100.0)
Platelets: 148 10*3/uL — ABNORMAL LOW (ref 150–400)
RBC: 4.16 MIL/uL (ref 3.87–5.11)
RDW: 13.2 % (ref 11.5–15.5)
WBC: 7 10*3/uL (ref 4.0–10.5)
nRBC: 0 % (ref 0.0–0.2)

## 2022-10-12 LAB — BASIC METABOLIC PANEL
Anion gap: 13 (ref 5–15)
BUN: 19 mg/dL (ref 8–23)
CO2: 21 mmol/L — ABNORMAL LOW (ref 22–32)
Calcium: 9.9 mg/dL (ref 8.9–10.3)
Chloride: 97 mmol/L — ABNORMAL LOW (ref 98–111)
Creatinine, Ser: 0.78 mg/dL (ref 0.44–1.00)
GFR, Estimated: >60 mL/min (ref >60)
Glucose, Bld: 122 mg/dL — ABNORMAL HIGH (ref 70–99)
Potassium: 3.7 mmol/L (ref 3.5–5.1)
Sodium: 131 mmol/L — ABNORMAL LOW (ref 135–145)

## 2022-10-12 LAB — VITAMIN D 25 HYDROXY (VIT D DEFICIENCY, FRACTURES): Vit D, 25-Hydroxy: 46.55 ng/mL (ref 30–100)

## 2022-10-12 MED ORDER — MAGNESIUM GLUCONATE 500 MG PO TABS
250.0000 mg | ORAL_TABLET | Freq: Every day | ORAL | Status: DC
  Administered 2022-10-12 – 2022-10-20 (×9): 250 mg via ORAL
  Filled 2022-10-12 (×9): qty 1

## 2022-10-12 MED ORDER — ATORVASTATIN CALCIUM 10 MG PO TABS
20.0000 mg | ORAL_TABLET | Freq: Every day | ORAL | Status: DC
  Administered 2022-10-12 – 2022-10-20 (×9): 20 mg via ORAL
  Filled 2022-10-12 (×9): qty 2

## 2022-10-12 MED ORDER — B COMPLEX-C PO TABS
1.0000 | ORAL_TABLET | Freq: Every day | ORAL | Status: DC
  Administered 2022-10-12 – 2022-10-21 (×10): 1 via ORAL
  Filled 2022-10-12 (×10): qty 1

## 2022-10-12 NOTE — Progress Notes (Signed)
PROGRESS NOTE   Subjective/Complaints: +fatigue, will start vitamin C and B supplement, added on vitamin D level.  No other complaints Labs reviewed and stable  ROS: +fatigue   Objective:   No results found. Recent Labs    10/11/22 0246 10/12/22 0729  WBC 6.6 7.0  HGB 13.2 14.2  HCT 39.1 40.2  PLT 145* 148*   Recent Labs    10/11/22 0246 10/12/22 0729  NA 130* 131*  K 4.0 3.7  CL 98 97*  CO2 27 21*  GLUCOSE 108* 122*  BUN 9 19  CREATININE 0.65 0.78  CALCIUM 9.6 9.9    Intake/Output Summary (Last 24 hours) at 10/12/2022 1328 Last data filed at 10/12/2022 1314 Gross per 24 hour  Intake 570 ml  Output --  Net 570 ml        Physical Exam: Vital Signs Blood pressure (!) 146/60, pulse 89, temperature 98.5 F (36.9 C), temperature source Oral, resp. rate 16, SpO2 93 %. Gen: no distress, normal appearing HEENT: oral mucosa pink and moist, NCAT Cardio: Reg rate Chest: normal effort, normal rate of breathing Abd: soft, non-distended Ext: no edema Psych: pleasant, normal affect Skin: intact Neuro: Alert and oriented x3  Musculoskeletal: 5/5 strength throughout      Assessment/Plan: 1. Functional deficits which require 3+ hours per day of interdisciplinary therapy in a comprehensive inpatient rehab setting. Physiatrist is providing close team supervision and 24 hour management of active medical problems listed below. Physiatrist and rehab team continue to assess barriers to discharge/monitor patient progress toward functional and medical goals  Care Tool:  Bathing              Bathing assist       Upper Body Dressing/Undressing Upper body dressing        Upper body assist      Lower Body Dressing/Undressing Lower body dressing            Lower body assist       Toileting Toileting    Toileting assist       Transfers Chair/bed transfer  Transfers assist            Locomotion Ambulation   Ambulation assist              Walk 10 feet activity   Assist           Walk 50 feet activity   Assist           Walk 150 feet activity   Assist           Walk 10 feet on uneven surface  activity   Assist           Wheelchair     Assist               Wheelchair 50 feet with 2 turns activity    Assist            Wheelchair 150 feet activity     Assist          Blood pressure (!) 146/60, pulse 89, temperature 98.5 F (36.9 C), temperature source Oral, resp. rate 16, SpO2 93 %.  Medical  Problem List and Plan: 1. Functional deficits secondary to stroke like symptoms s/p TNK             -patient may shower             -ELOS/Goals: 5-7 days S             Admit to CIR 2.  Antithrombotics: -DVT/anticoagulation:  Mechanical: Sequential compression devices, entire leg Bilateral lower extremities             -antiplatelet therapy: aspirin '81mg'$  daily 3. Neuropathy: continue cymbalta '60mg'$  BID.  4. Anxiety: decrease klonopin to '1mg'$  HS 5. Neuropsych/cognition: This patient is capable of making decisions on her own behalf. 6. HTN: continue cozaar '100mg'$  daily, HCTZ '25mg'$  daily, and Norvasc '5mg'$  dauly. Magnesium gluconate '250mg'$  ordered HS 7. HLD: atorvastain increased to '20mg'$  as LDL is 77 and goal is <70 8. Hyponatremia: Na improved to baseline, monitor weekly 9. 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 Fibromyalgia: on dilaudid, discuss current use of dilaudid 12. Fatigue: add vitamin B and B supplements 13. Screening for vitamin D deficiency: check vitamin D level today    LOS: 1 days A FACE TO FACE EVALUATION WAS PERFORMED  Genette Huertas P Kaiya Boatman 10/12/2022, 1:28 PM

## 2022-10-12 NOTE — Progress Notes (Signed)
PROGRESS NOTE   Subjective/Complaints: Reports she continues to have generalized weakness.She has had continued blurred vision and headache yesterday although this has improved today.   ROS: +fatigue, no CP or SOB, no abdominal pain, HA-improved, no changes in sensation or strength today   Objective:   No results found. Recent Labs    10/11/22 0246 10/12/22 0729  WBC 6.6 7.0  HGB 13.2 14.2  HCT 39.1 40.2  PLT 145* 148*    Recent Labs    10/11/22 0246 10/12/22 0729  NA 130* 131*  K 4.0 3.7  CL 98 97*  CO2 27 21*  GLUCOSE 108* 122*  BUN 9 19  CREATININE 0.65 0.78  CALCIUM 9.6 9.9     Intake/Output Summary (Last 24 hours) at 10/12/2022 1435 Last data filed at 10/12/2022 1314 Gross per 24 hour  Intake 570 ml  Output --  Net 570 ml         Physical Exam: Vital Signs Blood pressure (!) 146/60, pulse 89, temperature 98.5 F (36.9 C), temperature source Oral, resp. rate 16, SpO2 93 %. Gen: no distress, normal appearing HEENT: oral mucosa pink and moist, NCAT Cardio: Reg rate Chest: CTAB, normal effort, normal rate of breathing Abd: soft, non-distended, +BS Ext: no edema Psych: pleasant, normal affect Skin: intact Neuro: Alert and oriented x3  Musculoskeletal: 5/5 strength throughout      Assessment/Plan: 1. Functional deficits which require 3+ hours per day of interdisciplinary therapy in a comprehensive inpatient rehab setting. Physiatrist is providing close team supervision and 24 hour management of active medical problems listed below. Physiatrist and rehab team continue to assess barriers to discharge/monitor patient progress toward functional and medical goals  Care Tool:  Bathing              Bathing assist       Upper Body Dressing/Undressing Upper body dressing        Upper body assist      Lower Body Dressing/Undressing Lower body dressing            Lower body  assist       Toileting Toileting    Toileting assist       Transfers Chair/bed transfer  Transfers assist           Locomotion Ambulation   Ambulation assist              Walk 10 feet activity   Assist           Walk 50 feet activity   Assist           Walk 150 feet activity   Assist           Walk 10 feet on uneven surface  activity   Assist           Wheelchair     Assist               Wheelchair 50 feet with 2 turns activity    Assist            Wheelchair 150 feet activity     Assist          Blood pressure Marland Kitchen)  146/60, pulse 89, temperature 98.5 F (36.9 C), temperature source Oral, resp. rate 16, SpO2 93 %.  Medical Problem List and Plan: 1. Functional deficits secondary to stroke like symptoms s/p TNK             -patient may shower             -ELOS/Goals: 5-7 days S             -Continue CIR with PT/OT/SLP  -NO therapy today due to holiday-resume tomorrow 2.  Antithrombotics: -DVT/anticoagulation:  Mechanical: Sequential compression devices, entire leg Bilateral lower extremities             -antiplatelet therapy: aspirin '81mg'$  daily 3. Neuropathy: continue cymbalta '60mg'$  BID.  4. Anxiety: decrease klonopin to '1mg'$  HS 5. Neuropsych/cognition: This patient is capable of making decisions on her own behalf. 6. HTN: continue cozaar '100mg'$  daily, HCTZ '25mg'$  daily, and Norvasc '5mg'$  dauly. Magnesium gluconate '250mg'$  ordered HS  -12/25 fair control, continue current medications     10/12/2022    1:11 PM 10/12/2022    4:09 AM 10/11/2022    7:20 PM  Vitals with BMI  Systolic 768 088 110  Diastolic 60 49 67  Pulse 89 78 87    7. HLD: atorvastain increased to '20mg'$  as LDL is 77 and goal is <70, heart healthy diet 8. Hyponatremia: Na improved to baseline, monitor weekly  -12/25 stable at 131 today 9. Obesity: BMI 31.99: provide dietary education 10. Menieres disease: patient has cochlear  inplant 11. Fibromyalgia: on dilaudid, discuss current use of dilaudid  -Continue cymbalta 12. Fatigue: add vitamin B and B supplements 13. Screening for vitamin D deficiency: check vitamin D level today    LOS: 1 days A FACE TO FACE EVALUATION WAS PERFORMED  Jennye Boroughs 10/12/2022, 2:35 PM

## 2022-10-13 DIAGNOSIS — R2990 Unspecified symptoms and signs involving the nervous system: Secondary | ICD-10-CM | POA: Diagnosis not present

## 2022-10-13 DIAGNOSIS — H8109 Meniere's disease, unspecified ear: Secondary | ICD-10-CM | POA: Diagnosis not present

## 2022-10-13 LAB — BASIC METABOLIC PANEL
Anion gap: 10 (ref 5–15)
BUN: 10 mg/dL (ref 8–23)
CO2: 26 mmol/L (ref 22–32)
Calcium: 10.2 mg/dL (ref 8.9–10.3)
Chloride: 91 mmol/L — ABNORMAL LOW (ref 98–111)
Creatinine, Ser: 0.58 mg/dL (ref 0.44–1.00)
GFR, Estimated: >60 mL/min (ref >60)
Glucose, Bld: 171 mg/dL — ABNORMAL HIGH (ref 70–99)
Potassium: 3.8 mmol/L (ref 3.5–5.1)
Sodium: 127 mmol/L — ABNORMAL LOW (ref 135–145)

## 2022-10-13 LAB — CBC
HCT: 41.6 % (ref 36.0–46.0)
Hemoglobin: 14.9 g/dL (ref 12.0–15.0)
MCH: 34 pg (ref 26.0–34.0)
MCHC: 35.8 g/dL (ref 30.0–36.0)
MCV: 95 fL (ref 80.0–100.0)
Platelets: 145 10*3/uL — ABNORMAL LOW (ref 150–400)
RBC: 4.38 MIL/uL (ref 3.87–5.11)
RDW: 13 % (ref 11.5–15.5)
WBC: 6.1 10*3/uL (ref 4.0–10.5)
nRBC: 0 % (ref 0.0–0.2)

## 2022-10-13 MED ORDER — VITAMIN D (ERGOCALCIFEROL) 1.25 MG (50000 UNIT) PO CAPS
50000.0000 [IU] | ORAL_CAPSULE | ORAL | Status: DC
  Administered 2022-10-13 – 2022-10-20 (×2): 50000 [IU] via ORAL
  Filled 2022-10-13 (×2): qty 1

## 2022-10-13 MED ORDER — ASENAPINE MALEATE 5 MG SL SUBL: 10.0000 mg | SUBLINGUAL_TABLET | Freq: Every day | SUBLINGUAL | Status: DC

## 2022-10-13 MED ORDER — TRAZODONE HCL 50 MG PO TABS
50.0000 mg | ORAL_TABLET | Freq: Every day | ORAL | Status: DC
  Administered 2022-10-13 – 2022-10-20 (×8): 50 mg via ORAL
  Filled 2022-10-13 (×8): qty 1

## 2022-10-13 NOTE — Evaluation (Signed)
Speech Language Pathology Assessment and Plan  Patient Details  Name: Elizabeth Stone MRN: [id number] Date of Birth: 1946/07/09  SLP Diagnosis: Cognitive Impairments;Speech and Language deficits;Dysarthria  Rehab Potential: Good ELOS: 5-7 days    Today's Date: 10/13/2022 SLP Individual Time: 2774-1287 SLP Individual Time Calculation (min): 75 min   Hospital Problem: Principal Problem:   CVA (cerebral vascular accident) [facility])  Past Medical History:  Past Medical History:  Diagnosis Date   Acute blood loss anemia    hx   Anxiety    Arthritis    Balance problem    Bipolar disorder ([place])    Cochlear implant in place    Cochlear implant in place    right ear   Depression    Dermatitis    on face   Dyspnea    Fibromyalgia    GERD (gastroesophageal reflux disease)    Hard of hearing    Hearing aid worn    left ear   Hearing impaired    has cochlear implant   HTN (hypertension)    Hypokalemia    Meniere disease    takes HCTZ for her inner ear problem   Mixed hyperlipidemia 09/27/2013   Osteoporosis    Pneumonia 11/2016   Pre-diabetes    Primary localized osteoarthritis of left knee 11/16/2018   Primary localized osteoarthritis of right knee    Slow transit constipation    Unsteady gait    Past Surgical History:  Past Surgical History:  Procedure Laterality Date   ABDOMINAL HYSTERECTOMY  1986   bladder tack  2007   COCHLEAR IMPLANT     2007   COLONOSCOPY     COLONOSCOPY     HAMMER TOE SURGERY  2014   left foot   REVERSE SHOULDER ARTHROPLASTY Right 12/13/2014   dr Tamera Punt   REVERSE SHOULDER ARTHROPLASTY Right 12/13/2014   Procedure: REVERSE SHOULDER ARTHROPLASTY rt;  Surgeon: Nita Sells, MD;  Location: [place];  Service: Orthopedics;  Laterality: Right;  Right reverse total shoulder replacement   REVERSE SHOULDER ARTHROPLASTY Left 01/07/2017   Procedure: REVERSE LEFT TOTAL SHOULDER ARTHROPLASTY;  Surgeon: Tania Ade, MD;  Location: [place];   Service: Orthopedics;  Laterality: Left;  Left reveres total shoulder arthroplasty   TOTAL KNEE ARTHROPLASTY Right 12/16/2015   Procedure: TOTAL KNEE ARTHROPLASTY;  Surgeon: Elsie Saas, MD;  Location: [place];  Service: Orthopedics;  Laterality: Right;   TOTAL KNEE ARTHROPLASTY Left 11/28/2018   Procedure: LEFT TOTAL KNEE ARTHROPLASTY;  Surgeon: Elsie Saas, MD;  Location: [place];  Service: Orthopedics;  Laterality: Left;    Assessment / Plan / Recommendation Clinical Impression   76 year old female with history of cochlear implant, anxiety, depression, HLD, Meniere disease, fibromyalgia, bipolar disored, type 2 DM and HTN. Presented on 10/07/22 for acute onset of headache and blurred /double vision. CT head no acute process. CTA negative for LVO. TNK administered. CT imaging with no evidence of bleed but somewhat limited by her cochlear implants. Unable to obtain MRI due to cochlear implants.  2 d echo EF 65-70%. On asa prior to admit, now on no antithrombotic until post TNK 24 hrs.  Home meds of HCTZ remains normotensive. Home meds Lipitor with LDL 77. Increase to 20 mg of Lipitor. Home meds of metformin with Hgb A1c 5.4. Pt. Seen by PT/OT who recommend CIR to assit return to PLOF.   Pt presents with mild-moderate cognitive linguistic impairments, further impacted by chronic hearing loss and acute visual deficits. Pt supports  chronic impairments in speech intelligibility over the past year and her audiologist recommended ST therapy (pt had not yet f/u.) Pt has a cochlear implant since 2005 with acute hearing loss started at age 36. Pt noted acute changes in double vision, attention and memory impacting higher level problem solving. SLP administered formal cognitive linguistic assessment CLQT, pt scores are listed below. Pt would benefit from skilled ST services to maximize functional independence and reduce burden of care, possible 24 hour supervision and continued OPST services at discharge.     Skilled Therapeutic Interventions          SLP facilitated cognitive linguistic skills administering CLQT. Pt scored severe deficits in executive function (further impacted by visual deficits), moderate deficits in attention (further impacted by visual deficits), mild deficits in visuospatial awareness and [facility] for memory. SLP provided written strategies to increase communication with pt due to hearing impairment. SLP and pt created treatment plan. Pt was left with call bell within reach and bed alarm set. Recommend to continue ST services.    SLP Assessment  Patient will need skilled [place] [facility]ology Services during CIR admission    Recommendations  Patient destination: Home Follow up Recommendations: Outpatient SLP Equipment Recommended: None recommended by SLP    SLP Frequency 3 to 5 out of 7 days   SLP Duration  SLP Intensity  SLP Treatment/Interventions 5-7 days  Minumum of 1-2 x/day, 30 to 90 minutes  Cognitive remediation/compensation;Cueing hierarchy;Functional tasks;Patient/family education;Internal/external aids;Environmental controls    Pain Pain Assessment Pain Scale: 0-10 Pain Score: 0-No pain Pain Type: Chronic pain Pain Location: Back Pain Orientation: Lower Pain Descriptors / Indicators: Aching;Throbbing Pain Onset: On-going Patients Stated Pain Goal: 3 Pain Intervention(s): Medication (See eMAR);Rest;Repositioned;Ambulation/increased activity  Prior Functioning Cognitive/Linguistic Baseline: Baseline deficits Baseline deficit details: pt supports baseline deficits in completing mazes Type of Home: House  Lives With: Alone Available Help at Discharge: Other (Comment) Vocation: On disability  SLP Evaluation Cognition Overall Cognitive Status: Impaired/Different from baseline Arousal/Alertness: Awake/alert Orientation Level: Oriented X4 Attention: Selective Selective Attention: Impaired Selective Attention Impairment: Functional  complex Memory: Impaired Memory Impairment: Decreased short term memory Decreased Short Term Memory: Functional complex;Verbal complex Awareness: Appears intact Problem Solving: Impaired Problem Solving Impairment: Verbal complex;Functional complex Behaviors: Impulsive Safety/Judgment: Appears intact  Comprehension Auditory Comprehension Overall Auditory Comprehension: Impaired Yes/No Questions: Within Functional Limits Commands: Impaired Multistep Basic Commands: 75-100% accurate (memory and hearing deficits) Conversation: Complex Interfering Components: Hearing;Processing speed;Attention EffectiveTechniques: Extra processing time;Repetition;Increased volume;Pausing;Slowed speech Visual Recognition/Discrimination Discrimination: Within Function Limits Reading Comprehension Reading Status: Not tested Expression Expression Primary Mode of Expression: Verbal Verbal Expression Overall Verbal Expression: Impaired Initiation: No impairment Level of Generative/Spontaneous Verbalization: Conversation Repetition: No impairment Naming: No impairment Pragmatics: No impairment Interfering Components: Speech intelligibility;Premorbid deficit Written Expression Dominant Hand: Right Oral Motor Oral Motor/Sensory Function Overall Oral Motor/Sensory Function: Within functional limits Motor Speech Overall Motor Speech: Impaired Respiration: Within functional limits Phonation: Normal Articulation: Impaired Level of Impairment: Sentence Intelligibility: Intelligibility reduced Word: 75-100% accurate Phrase: 75-100% accurate Sentence: 50-74% accurate Conversation: 50-74% accurate Motor Planning: Witnin functional limits Motor Speech Errors: Not applicable Interfering Components: Premorbid status;Hearing loss Effective Techniques: Slow rate;Increased vocal intensity  Care Tool Care Tool Cognition Ability to hear (with hearing aid or hearing appliances if normally used Ability to  hear (with hearing aid or hearing appliances if normally used): 2. Moderate difficulty - speaker has to increase volume and speak distinctly   Expression of Ideas and Wants Expression of Ideas and Wants: 3. Some  difficulty - exhibits some difficulty with expressing needs and ideas (e.g, some words or finishing thoughts) or speech is not clear   Understanding Verbal and Non-Verbal Content Understanding Verbal and Non-Verbal Content: 3. Usually understands - understands most conversations, but misses some part/intent of message. Requires cues at times to understand  Memory/Recall Ability Memory/Recall Ability : Current season;That he or she is in a hospital/hospital unit   PMSV Assessment  PMSV Trial Intelligibility: Intelligibility reduced Word: 75-100% accurate Phrase: 75-100% accurate Sentence: 50-74% accurate Conversation: 50-74% accurate    Short Term Goals: Week 1: SLP Short Term Goal 1 (Week 1): STG=LTG due to short ELOS (5-7 days)  Refer to Care Plan for Long Term Goals  Recommendations for other services: None   Discharge Criteria: Patient will be discharged from SLP if patient refuses treatment 3 consecutive times without medical reason, if treatment goals not met, if there is a change in medical status, if patient makes no progress towards goals or if patient is discharged from hospital.  The above assessment, treatment plan, treatment alternatives and goals were discussed and mutually agreed upon: by patient  Korene Dula  [facility] 10/13/2022, 2:57 PM

## 2022-10-13 NOTE — Plan of Care (Signed)
Problem: RH Balance Goal: LTG Patient will maintain dynamic standing with ADLs (OT) Description: LTG:  Patient will maintain dynamic standing balance with assist during activities of daily living (OT)  Flowsheets (Taken 10/13/2022 1603) LTG: Pt will maintain dynamic standing balance during ADLs with: Independent with assistive device   Problem: Sit to Stand Goal: LTG:  Patient will perform sit to stand in prep for activites of daily living with assistance level (OT) Description: LTG:  Patient will perform sit to stand in prep for activites of daily living with assistance level (OT) Flowsheets (Taken 10/13/2022 1603) LTG: PT will perform sit to stand in prep for activites of daily living with assistance level: Independent with assistive device   Problem: RH Bathing Goal: LTG Patient will bathe all body parts with assist levels (OT) Description: LTG: Patient will bathe all body parts with assist levels (OT) Flowsheets (Taken 10/13/2022 1603) LTG: Pt will perform bathing with assistance level/cueing: Independent with assistive device  LTG: Position pt will perform bathing: Shower   Problem: RH Dressing Goal: LTG Patient will perform upper body dressing (OT) Description: LTG Patient will perform upper body dressing with assist, with/without cues (OT). Flowsheets (Taken 10/13/2022 1603) LTG: Pt will perform upper body dressing with assistance level of: Independent Goal: LTG Patient will perform lower body dressing w/assist (OT) Description: LTG: Patient will perform lower body dressing with assist, with/without cues in positioning using equipment (OT) Flowsheets (Taken 10/13/2022 1603) LTG: Pt will perform lower body dressing with assistance level of: Independent with assistive device   Problem: RH Toileting Goal: LTG Patient will perform toileting task (3/3 steps) with assistance level (OT) Description: LTG: Patient will perform toileting task (3/3 steps) with assistance level (OT)   Flowsheets (Taken 10/13/2022 1603) LTG: Pt will perform toileting task (3/3 steps) with assistance level: Independent with assistive device   Problem: RH Vision Goal: RH LTG Vision Theme park manager) Flowsheets (Taken 10/13/2022 1603) LTG: Vision Goals: Patient will demonstrate safe compensation for diplopia as needed   Problem: RH Simple Meal Prep Goal: LTG Patient will perform simple meal prep w/assist (OT) Description: LTG: Patient will perform simple meal prep with assistance, with/without cues (OT). Flowsheets (Taken 10/13/2022 1603) LTG: Pt will perform simple meal prep with assistance level of: Contact Guard/Touching assist LTG: Pt will perform simple meal prep w/level of: Ambulate with device   Problem: RH Laundry Goal: LTG Patient will perform laundry w/assist, cues (OT) Description: LTG: Patient will perform laundry with assistance, with/without cues (OT). Flowsheets (Taken 10/13/2022 1603) LTG: Pt will perform laundry with assistance level of: Minimal Assistance - Patient > 75% LTG: Pt will perform laundry with level of: Ambulate with device   Problem: RH Light Housekeeping Goal: LTG Patient will perform light housekeeping w/assist (OT) Description: LTG: Patient will perform light housekeeping with assistance, with/without cues (OT). Flowsheets (Taken 10/13/2022 1603) LTG: Pt will perform light housekeeping with assistance level of: Minimal Assistance - Patient > 75% LTG: Pt will perform light housekeeping w/level of: Ambulate with device   Problem: RH Memory Goal: LTG Patient will demonstrate ability for day to day recall/carry over during activities of daily living with assistance level (OT) Description: LTG:  Patient will demonstrate ability for day to day recall/carry over during activities of daily living with assistance level (OT). Flowsheets (Taken 10/13/2022 1603) LTG:  Patient will demonstrate ability for day to day recall/carry over during activities of daily living  with assistance level (OT): Modified Independent   Problem: RH Tub/Shower Transfers Goal: LTG Patient will perform  tub/shower transfers w/assist (OT) Description: LTG: Patient will perform tub/shower transfers with assist, with/without cues using equipment (OT) Flowsheets (Taken 10/13/2022 1603) LTG: Pt will perform tub/shower stall transfers with assistance level of: Independent with assistive device LTG: Pt will perform tub/shower transfers from: Walk in shower   Problem: RH Attention Goal: LTG Patient will demonstrate this level of attention during functional activites (OT) Description: LTG:  Patient will demonstrate this level of attention during functional activites  (OT) Flowsheets (Taken 10/13/2022 1603) Patient will demonstrate this level of attention during functional activites: Alternating LTG: Patient will demonstrate this level of attention during functional activites (OT): Modified Independent   Problem: RH Awareness Goal: LTG: Patient will demonstrate awareness during functional activites type of (OT) Description: LTG: Patient will demonstrate awareness during functional activites type of (OT) Flowsheets (Taken 10/13/2022 1603) Patient will demonstrate awareness during functional activites type of: Anticipatory LTG: Patient will demonstrate awareness during functional activites type of (OT): Modified Independent

## 2022-10-13 NOTE — Progress Notes (Addendum)
PMR Admission Coordinator Pre-Admission Assessment   Patient: Elizabeth Stone is an 76 y.o., female MRN: [id number] DOB: [birth year] Height: _0  (154.9 cm) Weight: 76.8 kg   Insurance Information HMO:     PPO:      PCP:      IPA:      80/20:      OTHER:  PRIMARY: Blue Medicare      Policy#: [id number]      Subscriber: pt CM Name: Larene Beach      Phone#: [phone]     Fax#: [phone] Pre-Cert#: [id number]      Employer:  Manuela Schwartz with [facility] Medicare called and gave approval for admit on 10/10/22, Authed for admit 10/11/22-10/16/22. Benefits:  Phone #: [phone]     Name: 12/22 Eff. Date: 10/19/21     Deduct: does not have one      Out of Pocket Max: $3,950 ($303/97 met)      Life Max: n/a  CIR: $335 per day copay for days 1-5      SNF: $0 copay per day for days 1-20, $196 per day copay for days 21-60. $0 copay for days 61-100. Maximum of 100 days per benefit period  Outpatient: $10 copay per visitHome  Health: 100%        DME: 80%     Co-Pay: 20% Providers: in network   SECONDARY: none   Financial Counselor:       Phone#:    The Engineer, petroleum" for patients in Inpatient Rehabilitation Facilities with attached "Privacy Act [place] Records" was provided and verbally reviewed with: Patient and Family   Emergency Contact Information Contact Information       Name Relation Home Work Mobile    [place] Son     [phone]    Dorthy Cooler Daughter [phone]        [place] Code [phone]             Current Medical History  Patient Admitting Diagnosis: CVA   History of Present Illness: 76 year old female with history of cochlear implant, anxiety, depression, HLD, Meniere disease, fibromyalgia, bipolar disored, type 2 DM and HTN. Presented on 10/07/22 for acute onset of headache and blurred /double vision. CT head no acute process. CTA negative for LVO. TNK administered. CT imaging with no evidence of bleed but somewhat limited by  her cochlear implants. Unable to obtain MRI due to cochlear implants.  2 d echo EF 65-70%. On asa prior to admit, now on no antithrombotic until post TNK 24 hrs.  Home meds of HCTZ remains normotensive. Home meds Lipitor with LDL 77. Increase to 20 mg of Lipitor. Home meds of metformin with Hgb A1c 5.4. Pt. Seen by PT/OT who recommend CIR to assit return to PLOF.    Complete NIHSS TOTAL: 3   Patient's medical record from [facility] has been reviewed by the rehabilitation admission coordinator and physician.   Past Medical History      Past Medical History:  Diagnosis Date   Acute blood loss anemia      hx   Anxiety     Arthritis     Balance problem     Bipolar disorder ([place])     Cochlear implant in place     Cochlear implant in place      right ear   Depression     Dermatitis      on face   Dyspnea     Fibromyalgia  GERD (gastroesophageal reflux disease)     Hard of hearing     Hearing aid worn      left ear   Hearing impaired      has cochlear implant   HTN (hypertension)     Hypokalemia     Meniere disease      takes HCTZ for her inner ear problem   Mixed hyperlipidemia 09/27/2013   Osteoporosis     Pneumonia 11/2016   Pre-diabetes     Primary localized osteoarthritis of left knee 11/16/2018   Primary localized osteoarthritis of right knee     Slow transit constipation     Unsteady gait      Has the patient had major surgery during 100 days prior to admission? No   Family History   family history includes Breast cancer in her mother; Cervical cancer in her mother; Diabetes in her maternal aunt and mother; Heart disease in her father and mother.   Current Medications   Current Facility-Administered Medications:    acetaminophen (TYLENOL) tablet 650 mg, 650 mg, Oral, Q4H PRN **OR** acetaminophen (TYLENOL) 160 MG/5ML solution 650 mg, 650 mg, Per Tube, Q4H PRN **OR** acetaminophen (TYLENOL) suppository 650 mg, 650 mg, Rectal, Q4H PRN, Thressa Sheller, Erin C, NP    atorvastatin (LIPITOR) tablet 10 mg, 10 mg, Oral, QHS, Khaliqdina, Salman, MD, 10 mg at 10/08/22 2114   buPROPion (WELLBUTRIN XL) 24 hr tablet 300 mg, 300 mg, Oral, Daily, Donnetta Simpers, MD, 300 mg at 10/09/22 0948   Chlorhexidine Gluconate Cloth 2 % PADS 6 each, 6 each, Topical, Daily, Donnetta Simpers, MD, 6 each at 10/09/22 0949   clonazePAM (KLONOPIN) tablet 1-2 mg, 1-2 mg, Oral, QHS, Khaliqdina, Alferd Patee, MD, 1 mg at 10/08/22 2114   DULoxetine (CYMBALTA) DR capsule 60 mg, 60 mg, Oral, BID, Donnetta Simpers, MD, 60 mg at 10/09/22 0948   HYDROcodone-acetaminophen (NORCO) 10-325 MG per tablet 1 tablet, 1 tablet, Oral, Q12H PRN, Donnetta Simpers, MD   HYDROmorphone (DILAUDID) tablet 2 mg, 2 mg, Oral, Q6H PRN, Donnetta Simpers, MD, 2 mg at 10/08/22 1839   lamoTRIgine (LAMICTAL) tablet 300 mg, 300 mg, Oral, Daily, Donnetta Simpers, MD, 300 mg at 10/09/22 9417   Oral care mouth rinse, 15 mL, Mouth Rinse, 4 times per day, Donnetta Simpers, MD, 15 mL at 10/09/22 1305   Oral care mouth rinse, 15 mL, Mouth Rinse, PRN, Donnetta Simpers, MD   pantoprazole (PROTONIX) EC tablet 40 mg, 40 mg, Oral, Daily, Donnetta Simpers, MD, 40 mg at 10/09/22 0948   senna-docusate (Senokot-S) tablet 1 tablet, 1 tablet, Oral, QHS PRN, Otelia Santee, NP   Patients Current Diet:  Diet Order                  Diet Heart Room service appropriate? No; Fluid consistency: Thin  Diet effective now                       Precautions / Restrictions Precautions Precautions: Fall Precaution Comments: impaired vision Restrictions Weight Bearing Restrictions: No    Has the patient had 2 or more falls or a fall with injury in the past year? No   Prior Activity Level Community (5-7x/wk): Mod I with RW; drives   Prior Functional Level Self Care: Did the patient need help bathing, dressing, using the toilet or eating? Independent   Indoor Mobility: Did the patient need assistance with walking from room  to room (with or without device)? Independent   Stairs: Did  the patient need assistance with internal or external stairs (with or without device)? Independent   Functional Cognition: Did the patient need help planning regular tasks such as shopping or remembering to take medications? Independent   Patient Information Are you of Hispanic, Latino/a,or Spanish origin?: A. No, not of Hispanic, Latino/a, or Spanish origin What is your race?: A. White Do you need or want an interpreter to communicate with a doctor or health care staff?: 0. No   Patient's Response To:  Health Literacy and Transportation Is the patient able to respond to health literacy and transportation needs?: Yes Health Literacy - How often do you need to have someone help you when you read instructions, pamphlets, or other written material from your doctor or pharmacy?: Never In the past 12 months, has lack of transportation kept you from medical appointments or from getting medications?: No In the past 12 months, has lack of transportation kept you from meetings, work, or from getting things needed for daily living?: No   Home Assistive Devices / [place] Devices/Equipment: Wellsite geologist, Hearing aid Home Equipment: Conservation officer, nature (2 wheels)   Prior Device Use: Indicate devices/aids used by the patient prior to current illness, exacerbation or injury? Walker   Current Functional Level Cognition   Overall Cognitive Status: Difficult to assess Current Attention Level: Selective Orientation Level: Oriented X4 Following Commands: Follows one step commands consistently Safety/Judgement: Decreased awareness of safety, Decreased awareness of deficits General Comments: Pt given instructions to throw her papaer towel in the trash. Pt verbalized understanding to throw the paper towel in the trashm however she threw it into the shower    Extremity Assessment (includes Sensation/Coordination)   Upper Extremity  Assessment:  (L grip feels slightly weaker than R however both functional; hx of frozen shoulder on R; deformities B hands from arthritis - using functionally - difficulty with fine motor at baseline)  Lower Extremity Assessment: Defer to PT evaluation     ADLs   Overall ADL's : Needs assistance/impaired Eating/Feeding: Set up, Sitting Grooming: Set up, Sitting Upper Body Bathing: Set up, Supervision/ safety, Sitting Lower Body Bathing: Minimal assistance, Sit to/from stand Upper Body Dressing : Set up, Supervision/safety, Sitting Lower Body Dressing: Minimal assistance, Sit to/from stand Lower Body Dressing Details (indicate cue type and reason): pt able to complete figure 4 with cueing, more difficulty reaching R foot compared to L Toilet Transfer: Minimal assistance, Ambulation Toileting- Clothing Manipulation and Hygiene: Minimal assistance Functional mobility during ADLs: Moderate assistance (mod A to prevent fall toward L wihtout use of AD)     Mobility   Overal bed mobility: Needs Assistance Bed Mobility: Supine to Sit, Sit to Supine Rolling: Min guard Sidelying to sit: Min assist Supine to sit: Supervision Sit to supine: Supervision General bed mobility comments: HOB slightly elevated. VC's for log roll for comfort due to back pain. Pt was able to complete with min assist for trunk elevation to full sittiong position.     Transfers   Overall transfer level: Needs assistance Equipment used: Rolling walker (2 wheels) Transfers: Sit to/from Stand Sit to Stand: Min assist General transfer comment: Assist to power up to full stand and gain/maintain standing balance.     Ambulation / Gait / Stairs / Wheelchair Mobility   Ambulation/Gait Ambulation/Gait assistance: Herbalist (Feet): 50 Feet Assistive device: Rolling walker (2 wheels) Gait Pattern/deviations: Step-through pattern, Decreased stride length, Narrow base of support, Trunk flexed General Gait Details:  Slow and guarded. Pt reports diplopia  when looking down the hallway. Occasional assist required for balance and walker management. Chair follow utilized. Gait velocity: Decreased Gait velocity interpretation: <1.31 ft/sec, indicative of household ambulator     Posture / Balance Balance Overall balance assessment: Needs assistance Sitting-balance support: No upper extremity supported, Feet supported Sitting balance-Leahy Scale: Fair Standing balance support: No upper extremity supported, During functional activity Standing balance-Leahy Scale: Poor     Special needs/care consideration Cochlear implants and reads lips    Previous Home Environment  Living Arrangements: Alone  Lives With: Alone Available Help at Discharge:  (son, dtr in Sports coach and church friends would provide support as needed after d/c from CIR) Type of Home: Other(Comment) (townhome) Home Layout: One level Home Access: Stairs to enter Entrance Stairs-Rails: Right, Left Entrance Stairs-Number of Steps: 8 Bathroom Shower/Tub: Multimedia programmer: Handicapped height Bathroom Accessibility: Yes How Accessible: Accessible via walker [place]:  (private OP SLP) Additional Comments: Life alert   Discharge Living Setting Plans for Discharge Living Setting: Patient's home, Other (Comment) (townhome) Type of Home at Discharge: Other (Comment) (townhome) Discharge Home Layout: One level Discharge Home Access: Stairs to enter Entrance Stairs-Rails: Right, Left Entrance Stairs-Number of Steps: 8 Discharge Bathroom Shower/Tub: Walk-in shower Discharge Bathroom Toilet: Handicapped height Discharge Bathroom Accessibility: Yes How Accessible: Accessible via walker Does the patient have any problems obtaining your medications?: No   Social/Family/Support Systems Patient Roles: Parent Contact Information: son, Jaynie Collins Anticipated Caregiver: son, dtr in Sports coach and church friends Anticipated Garment/textile technologist Information: see contacts Ability/Limitations of Caregiver: son works, but his wife is Buyer, retail Availability: 24/7 Discharge Plan Discussed with Primary Caregiver: Yes Is Caregiver In Agreement with Plan?: Yes Does Caregiver/Family have Issues with Lodging/Transportation while Pt is in Rehab?: No   Goals Patient/Family Goal for Rehab: Mod I to superivsion with PT, OT and SLP Expected length of stay: ELOS 10 to 12 days Additional Information: Cochlear implants and reads lips Pt/Family Agrees to Admission and willing to participate: Yes Program Orientation Provided & Reviewed with Pt/Caregiver Including Roles  & Responsibilities: Yes   Decrease burden of Care through IP rehab admission: n/a   Possible need for SNF placement upon discharge: not anticipated   Patient Condition: I have reviewed medical records from [facility], spoken with CM, and patient and son. I met with patient at the bedside for inpatient rehabilitation assessment.  Patient will benefit from ongoing PT, OT, and SLP, can actively participate in 3 hours of therapy a day 5 days of the week, and can make measurable gains during the admission.  Patient will also benefit from the coordinated team approach during an Inpatient Acute Rehabilitation admission.  The patient will receive intensive therapy as well as Rehabilitation physician, nursing, social worker, and care management interventions.  Due to bladder management, bowel management, safety, skin/wound care, disease management, medication administration, pain management, and patient education the patient requires 24 hour a day rehabilitation nursing.   Discharge setting and therapy post discharge at home with home health is anticipated.  Patient has agreed to participate in the Acute Inpatient Rehabilitation Program and will admit today.   Preadmission Screen Completed SF:[id number] Boyette RN MSN with updates by Clemens Catholic, MS, CCC-SLP   Cleatrice Burke, 10/09/2022 2:12 PM ______________________________________________________________________   Discussed status with Dr. Ranell Patrick  on 10/11/22 at 30 and received approval for admission today.   Admission Coordinator: Danne Baxter RN MSN with updates by Clemens Catholic, [place], [place], Audelia Acton,  RN, time 930/Date 10/11/22    Assessment/Plan: Diagnosis: CVA Does the need for close, 24 hr/day Medical supervision in concert with the patient's rehab needs make it unreasonable for this patient to be served in a less intensive setting? Yes Co-Morbidities requiring supervision/potential complications: obesity, history of cochlear implant, anxiety, depression, HLD, Meniere disease, fibromyalgia Due to bladder management, bowel management, safety, skin/wound care, disease management, medication administration, pain management, and patient education, does the patient require 24 hr/day rehab nursing? Yes Does the patient require coordinated care of a physician, rehab nurse, PT, OT to address physical and functional deficits in the context of the above medical diagnosis(es)? Yes Addressing deficits in the following areas: balance, endurance, locomotion, strength, transferring, bowel/bladder control, bathing, dressing, feeding, grooming, toileting, cognition, and psychosocial support Can the patient actively participate in an intensive therapy program of at least 3 hrs of therapy 5 days a week? Yes The potential for patient to make measurable gains while on inpatient rehab is excellent Anticipated functional outcomes upon discharge from inpatient rehab: supervision PT, supervision OT, independent SLP Estimated rehab length of stay to reach the above functional goals is: 5-7 days Anticipated discharge destination: Home 10. Overall Rehab/Functional Prognosis: excellent     MD Signature: Leeroy Cha, MD

## 2022-10-13 NOTE — Evaluation (Signed)
Occupational Therapy Assessment and Plan  Patient Details  Name: Elizabeth Stone MRN: [id number] Date of Birth: [birth year]  OT Diagnosis: abnormal posture, ataxia, disturbance of vision, and muscle weakness (generalized) Rehab Potential: Rehab Potential (ACUTE ONLY): Good ELOS: 5-7 days   Today's Date: 10/13/2022 OT Individual Time: 2947-6546 OT Individual Time Calculation (min): 78 min     Hospital Problem: Principal Problem:   CVA (cerebral vascular accident) ([place])   Past Medical History:  Past Medical History:  Diagnosis Date   Acute blood loss anemia    hx   Anxiety    Arthritis    Balance problem    Bipolar disorder ([place])    Cochlear implant in place    Cochlear implant in place    right ear   Depression    Dermatitis    on face   Dyspnea    Fibromyalgia    GERD (gastroesophageal reflux disease)    Hard of hearing    Hearing aid worn    left ear   Hearing impaired    has cochlear implant   HTN (hypertension)    Hypokalemia    Meniere disease    takes HCTZ for her inner ear problem   Mixed hyperlipidemia 09/27/2013   Osteoporosis    Pneumonia 11/2016   Pre-diabetes    Primary localized osteoarthritis of left knee 11/16/2018   Primary localized osteoarthritis of right knee    Slow transit constipation    Unsteady gait    Past Surgical History:  Past Surgical History:  Procedure Laterality Date   ABDOMINAL HYSTERECTOMY  1986   bladder tack  2007   COCHLEAR IMPLANT     2007   COLONOSCOPY     COLONOSCOPY     HAMMER TOE SURGERY  2014   left foot   REVERSE SHOULDER ARTHROPLASTY Right 12/13/2014   dr Tamera Punt   REVERSE SHOULDER ARTHROPLASTY Right 12/13/2014   Procedure: REVERSE SHOULDER ARTHROPLASTY rt;  Surgeon: Nita Sells, MD;  Location: [facility];  Service: Orthopedics;  Laterality: Right;  Right reverse total shoulder replacement   REVERSE SHOULDER ARTHROPLASTY Left 01/07/2017   Procedure: REVERSE LEFT TOTAL SHOULDER ARTHROPLASTY;  Surgeon:  Tania Ade, MD;  Location: [facility];  Service: Orthopedics;  Laterality: Left;  Left reveres total shoulder arthroplasty   TOTAL KNEE ARTHROPLASTY Right 12/16/2015   Procedure: TOTAL KNEE ARTHROPLASTY;  Surgeon: Elsie Saas, MD;  Location: [facility];  Service: Orthopedics;  Laterality: Right;   TOTAL KNEE ARTHROPLASTY Left 11/28/2018   Procedure: LEFT TOTAL KNEE ARTHROPLASTY;  Surgeon: Elsie Saas, MD;  Location: [facility];  Service: Orthopedics;  Laterality: Left;    Assessment & Plan Clinical Impression: Patient is a 76 y.o. female with history of HOH s/p Cochlear implant, anxiety and depression, HTN, HLD, meniere disease, fibromyalgia, bipolar disorder who presents to [facility] ED for acute onset of headache and blurred vision/double vision.  Patient transferred to CIR on 10/11/2022 .    Patient currently requires min with basic self-care skills secondary to unbalanced muscle activation, ataxia, and decreased coordination, decreased midline orientation and decreased motor planning, and decreased standing balance, decreased postural control, and decreased balance strategies.  Prior to hospitalization, patient could complete ADL/IADL with independent .  Patient will benefit from skilled intervention to increase independence with basic self-care skills and increase level of independence with iADL prior to discharge home with care partner.  Anticipate patient will require intermittent supervision and minimal physical assistance and follow up outpatient.  OT - End of  Session Activity Tolerance: Decreased this session Endurance Deficit: Yes Endurance Deficit Description: requires seated rest breaks OT Assessment Rehab Potential (ACUTE ONLY): Good OT Patient demonstrates impairments in the following area(s): Balance;Motor;Cognition;Vision;Perception;Endurance;Safety OT Basic ADL's Functional Problem(s): Bathing;Dressing;Toileting OT Advanced ADL's Functional Problem(s): Simple Meal Preparation;Laundry;Light  Housekeeping OT Transfers Functional Problem(s): Toilet;Tub/Shower OT Plan OT Intensity: Minimum of 1-2 x/day, 45 to 90 minutes OT Frequency: 5 out of 7 days OT Duration/Estimated Length of Stay: 5-7 days OT Treatment/Interventions: Balance/vestibular training;Neuromuscular re-education;Patient/family education;Self Care/advanced ADL retraining;Therapeutic Exercise;UE/LE Coordination activities;Visual/perceptual remediation/compensation;UE/LE Strength taining/ROM;Therapeutic Activities;Pain management;Functional mobility training;DME/adaptive equipment instruction;Discharge planning OT Basic Self-Care Anticipated Outcome(s): Mod I OT Toileting Anticipated Outcome(s): Mod I OT Bathroom Transfers Anticipated Outcome(s): Mod I OT Recommendation Patient destination: Home Follow Up Recommendations: Outpatient OT Equipment Recommended: To be determined   OT Evaluation Precautions/Restrictions  Precautions Precautions: Fall Precaution Comments: h/o falls, Menierre's Disease s/p cochlear implant Restrictions Weight Bearing Restrictions: No  Pain Assessment Pain Scale: 0-10 Pain Score: 0-No pain Pain Type: Chronic pain Pain Location: Back Pain Orientation: Lower Pain Descriptors / Indicators: Aching;Throbbing Pain Onset: On-going Patients Stated Pain Goal: 3 Pain Intervention(s): Medication (See eMAR);Rest;Repositioned;Ambulation/increased activity Home Living/Prior Functioning Home Living Family/patient expects to be discharged to:: Private residence Living Arrangements: Alone Available Help at Discharge: Available 24 hours/day (per pt's son, [name], family planning to provide 24hr support at D/C with pt either D/Cing to her home or to her son's home) Type of Home: Apartment Home Access: Stairs to enter [employer] of Steps: 9 Entrance Stairs-Rails: Right, Left (too wide to reach) Home Layout: One level Bathroom Shower/Tub: Multimedia programmer: Handicapped  height Bathroom Accessibility: Yes Additional Comments: Life alert  Lives With: Alone IADL History Homemaking Responsibilities: Yes Meal Prep Responsibility: Primary Laundry Responsibility: Primary Cleaning Responsibility: Secondary Current License: Yes Mode of Transportation: Car Occupation: Retired 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, 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 Leisure and [place]: church, active friend group, host dinner parties, read Prior Function Level of Independence: Independent with gait, Independent with transfers, Requires assistive device for independence (pt reports she would intermittently use RW on days she didn't feel stable but otherwise would furniture walk in the home; she would used motorized buggy at grocery store; would walk in/out of church building on Sundays)  Able to [place]?: Yes Driving: Yes Vocation: On disability Vocation Requirements: was driving to/from church and grocery store independently [place] Vision/History: 0 No visual deficits (denies double vision at this time) Ability to See in Adequate Light: 0 Adequate Patient Visual Report: Blurring of vision;Diplopia (Reports diplopia seems to have resolved) Vision Assessment?: Yes Eye Alignment: Within Functional Limits Ocular Range of Motion: Within Functional Limits;Other (comment) Alignment/Gaze Preference: Within Defined Limits Tracking/Visual Pursuits: Able to track stimulus in all quads without difficulty Depth Perception: Overshoots;Undershoots Perception  Perception: Impaired Spatial Orientation: posterior lean bias with impaired midline orientation Praxis Praxis: Impaired Praxis Impairment Details: Motor planning Cognition Cognition Overall Cognitive Status: Impaired/Different from baseline Arousal/Alertness: Awake/alert Orientation Level: Person;Place;Situation Person: Oriented Place: Oriented Situation: Oriented Memory: Impaired Memory Impairment: Decreased  short term memory Decreased Short Term Memory: Functional complex;Verbal complex Attention: Focused;Sustained Focused Attention: Appears intact Sustained Attention: Appears intact Selective Attention: Impaired Selective Attention Impairment: Functional complex Awareness: Appears intact Problem Solving: Impaired Problem Solving Impairment: Verbal complex;Functional complex Behaviors: Impulsive Safety/Judgment: Appears intact Brief Interview for Mental Status (BIMS) Repetition of Three Words (First Attempt): 3 Temporal Orientation: Year: Correct Temporal Orientation: Month: Accurate within 5 days Temporal Orientation: Day: Correct Recall: "Sock":  Yes, no cue required Recall: "Blue": Yes, no cue required Recall: "Bed": Yes, no cue required BIMS Summary Score: 15 Sensation Sensation Light Touch: Impaired Detail Peripheral sensation comments: impaired distally with pt reporting feels numb in her feet - appears to be decreased throughout bilateral LEs Light Touch Impaired Details: Impaired RLE;Impaired LLE Hot/Cold: Not tested Proprioception: Impaired by gross assessment (appears to have grossly impaired awareness of B LE placement/positioning during mobility tasks) Stereognosis: Not tested Coordination Gross Motor Movements are Fluid and Coordinated: No Fine Motor Movements are Fluid and Coordinated: No Coordination and Movement Description: GM movements impaired due to generalized weakness, impaired midline orientation, and overall impaired endurance Finger Nose Finger Test: Grossly overshooting LUE Motor  Motor Motor: Abnormal postural alignment and control;Other (comment) Motor - Skilled Clinical Observations: impaired due to generalized weakness, impaired midline orientation, and overall impaired endurance  Trunk/Postural Assessment  Cervical Assessment Cervical Assessment: Within Functional Limits Thoracic Assessment Thoracic Assessment: Exceptions to [facility] Lumbar  Assessment Lumbar Assessment: Exceptions to [facility] Postural Control Postural Control: Deficits on evaluation Protective Responses: delayed and inadequate in standing Postural Limitations: decreased in standing  Balance Balance Balance Assessed: Yes Static Sitting Balance Static Sitting - Balance Support: Feet supported Static Sitting - Level of Assistance: 5: Stand by assistance Dynamic Sitting Balance Dynamic Sitting - Balance Support: Feet supported Dynamic Sitting - Level of Assistance: 5: Stand by assistance;Other (comment) Static Standing Balance Static Standing - Balance Support: During functional activity;Bilateral upper extremity supported Static Standing - Level of Assistance: 4: Min assist Dynamic Standing Balance Dynamic Standing - Balance Support: During functional activity Dynamic Standing - Level of Assistance: 4: Min assist Dynamic Standing - Balance Activities: Forward lean/weight shifting;Reaching for objects;Reaching across midline Extremity/Trunk Assessment RUE Assessment RUE Assessment: Exceptions to [facility] Active Range of Motion (AROM) Comments: Can raise arm overhead h/o shoulder replacement General Strength Comments: 4-/5 RUE Body System: Ortho LUE Assessment LUE Assessment: Exceptions to [facility] Active Range of Motion (AROM) Comments: Can raise arm overhead.  H/O Shoulder Replacement LUE Body System: Neuro Brunstrum levels for arm and hand: Arm;Hand Brunstrum level for arm: Stage V Relative Independence from Synergy Brunstrum level for hand: Stage VI Isolated joint movements  Care Tool Care Tool Self Care Eating Eating activity did not occur: Safety/medical concerns (had already eaten breakfast)      Oral Care    Oral Care Assist Level: Supervision/Verbal cueing    Bathing   Body parts bathed by patient: Right arm;Left arm;Chest;Abdomen;Front perineal area;Buttocks;Right upper leg;Left upper leg;Right lower leg;Left lower leg;Face     Assist Level:  Contact Guard/Touching assist    Upper Body Dressing(including orthotics)   What is the patient wearing?: Bra;Pull over shirt   Assist Level: Minimal Assistance - Patient > 75%    Lower Body Dressing (excluding footwear)   What is the patient wearing?: Underwear/pull up;Pants Assist for lower body dressing: Contact Guard/Touching assist    Putting on/Taking off footwear   What is the patient wearing?: Shoes Assist for footwear: Supervision/Verbal cueing       Care Tool Toileting Toileting activity Toileting Activity did not occur (Clothing management and hygiene only): N/A (no void or bm)       Care Tool Bed Mobility Roll left and right activity   Roll left and right assist level: Independent with assistive device    Sit to lying activity Sit to lying activity did not occur: N/A      Lying to sitting on side of bed activity   Lying  to sitting on side of bed assist level: the ability to move from lying on the back to sitting on the side of the bed with no back support.: Supervision/Verbal cueing     Care Tool Transfers Sit to stand transfer   Sit to stand assist level: Contact Guard/Touching assist    Chair/bed transfer   Chair/bed transfer assist level: Minimal Assistance - Patient > 75%     Toilet transfer   Assist Level: Minimal Assistance - Patient > 75%     Care Tool Cognition  Expression of Ideas and Wants Expression of Ideas and Wants: 3. Some difficulty - exhibits some difficulty with expressing needs and ideas (e.g, some words or finishing thoughts) or speech is not clear  Understanding Verbal and Non-Verbal Content Understanding Verbal and Non-Verbal Content: 3. Usually understands - understands most conversations, but misses some part/intent of message. Requires cues at times to understand   Memory/Recall Ability Memory/Recall Ability : Current season;That he or she is in a hospital/hospital unit   Refer to Care Plan for [place] [facility]  STG= LTG due to LOS    Recommendations for other services: None    Skilled Therapeutic Intervention Patient receievd supine in bed.  Agreeable to OT evaluation and eager to shower.  ADL status as indicated below.  Patient with poor balance - shuffling gait, small fast steps, uses furniture for support.  Backward loss of balance noted.  Patient reports having balance trouble for years, but worsened by recent stroke like event.  ADL ADL Eating: Unable to assess Grooming: Contact guard Where Assessed-Grooming: Standing at sink Upper Body Bathing: Contact guard Where Assessed-Upper Body Bathing: Shower Lower Body Bathing: Contact guard Where Assessed-Lower Body Bathing: Shower Upper Body Dressing: Minimal assistance Where Assessed-Upper Body Dressing: Chair Lower Body Dressing: Minimal assistance Where Assessed-Lower Body Dressing: Chair Toileting: Unable to assess Toilet Transfer: Minimal assistance Toilet Transfer Method: Counselling psychologist: Grab bars Tub/Shower Transfer: Unable to assess Tub/Shower Transfer Method: Unable to assess Social research officer, government: Minimal assistance Social research officer, government Method: Heritage manager: Radio broadcast assistant ADL Comments: Patient has a shower seat at home but does not use it.  Patient's balance is biggest limiting factor for functional independence Mobility  Bed Mobility Bed Mobility: Supine to Sit;Sit to Supine Rolling Right: Supervision/verbal cueing Supine to Sit: Supervision/Verbal cueing Sit to Supine: Supervision/Verbal cueing Transfers Sit to Stand: Minimal Assistance - Patient > 75% Stand to Sit: Minimal Assistance - Patient > 75%   Discharge Criteria: Patient will be discharged from OT if patient refuses treatment 3 consecutive times without medical reason, if treatment goals not met, if there is a change in medical status, if patient makes no progress towards goals or if patient is  discharged from hospital.  The above assessment, treatment plan, treatment alternatives and goals were discussed and mutually agreed upon: by patient  Mariah Milling 10/13/2022, 3:48 PM

## 2022-10-13 NOTE — Plan of Care (Signed)
  Problem: RH Balance Goal: LTG Patient will maintain dynamic sitting balance (PT) Description: LTG:  Patient will maintain dynamic sitting balance with assistance during mobility activities (PT) Flowsheets (Taken 10/13/2022 1842) LTG: Pt will maintain dynamic sitting balance during mobility activities with:: Independent with assistive device  Goal: LTG Patient will maintain dynamic standing balance (PT) Description: LTG:  Patient will maintain dynamic standing balance with assistance during mobility activities (PT) Flowsheets (Taken 10/13/2022 1842) LTG: Pt will maintain dynamic standing balance during mobility activities with:: Supervision/Verbal cueing   Problem: Sit to Stand Goal: LTG:  Patient will perform sit to stand with assistance level (PT) Description: LTG:  Patient will perform sit to stand with assistance level (PT) Flowsheets (Taken 10/13/2022 1842) LTG: PT will perform sit to stand in preparation for functional mobility with assistance level: Independent with assistive device   Problem: RH Bed Mobility Goal: LTG Patient will perform bed mobility with assist (PT) Description: LTG: Patient will perform bed mobility with assistance, with/without cues (PT). Flowsheets (Taken 10/13/2022 1842) LTG: Pt will perform bed mobility with assistance level of: Independent with assistive device    Problem: RH Bed to Chair Transfers Goal: LTG Patient will perform bed/chair transfers w/assist (PT) Description: LTG: Patient will perform bed to chair transfers with assistance (PT). Flowsheets (Taken 10/13/2022 1842) LTG: Pt will perform Bed to Chair Transfers with assistance level: Independent with assistive device    Problem: RH Car Transfers Goal: LTG Patient will perform car transfers with assist (PT) Description: LTG: Patient will perform car transfers with assistance (PT). Flowsheets (Taken 10/13/2022 1842) LTG: Pt will perform car transfers with assist:: Supervision/Verbal cueing    Problem: RH Ambulation Goal: LTG Patient will ambulate in controlled environment (PT) Description: LTG: Patient will ambulate in a controlled environment, # of feet with assistance (PT). Flowsheets (Taken 10/13/2022 1842) LTG: Pt will ambulate in controlled environ  assist needed:: Supervision/Verbal cueing LTG: Ambulation distance in controlled environment: 132f using LRAD Goal: LTG Patient will ambulate in home environment (PT) Description: LTG: Patient will ambulate in home environment, # of feet with assistance (PT). Flowsheets (Taken 10/13/2022 1842) LTG: Pt will ambulate in home environ  assist needed:: Supervision/Verbal cueing LTG: Ambulation distance in home environment: 518fusing LRAD   Problem: RH Stairs Goal: LTG Patient will ambulate up and down stairs w/assist (PT) Description: LTG: Patient will ambulate up and down # of stairs with assistance (PT) Flowsheets (Taken 10/13/2022 1842) LTG: Pt will ambulate up/down stairs assist needed:: Supervision/Verbal cueing LTG: Pt will  ambulate up and down number of stairs: 8 steps using 1 HR per home set-up

## 2022-10-13 NOTE — Progress Notes (Signed)
[facility] Individual Statement of Services  Patient Name:  SHAMERE DILWORTH  Date:  10/13/2022  Welcome to the [facility].  Our goal is to provide you with an individualized program based on your diagnosis and situation, designed to meet your specific needs.  With this comprehensive rehabilitation program, you will be expected to participate in at least 3 hours of rehabilitation therapies Monday-Friday, with modified therapy programming on the weekends.  Your rehabilitation program will include the following services:  Physical Therapy (PT), Occupational Therapy (OT), Speech Therapy (ST), 24 hour per day rehabilitation nursing, Therapeutic Recreaction (TR), Neuropsychology, Care Coordinator, Rehabilitation Medicine, Nutrition Services, Pharmacy Services, and Other  Weekly team conferences will be held on Wednesdays to discuss your progress.  Your Inpatient Rehabilitation Care Coordinator will talk with you frequently to get your input and to update you on team discussions.  Team conferences with you and your family in attendance may also be held.  Expected length of stay:  10-12 Days  Overall anticipated outcome:  MOD I/Supervision  Depending on your progress and recovery, your program may change. Your Inpatient Rehabilitation Care Coordinator will coordinate services and will keep you informed of any changes. Your Inpatient Rehabilitation Care Coordinator's name and contact numbers are listed  below.  The following services may also be recommended but are not provided by the [facility]:   [place] will be made to provide these services after discharge if needed.  Arrangements include referral to agencies that provide these services.  Your insurance has been verified to be:   [place] Your primary doctor is:  Orpah Melter, MD  Pertinent  information will be shared with your doctor and your insurance company.  Inpatient Rehabilitation Care Coordinator:  Erlene Quan, [place] or [phone]  Information discussed with and copy given to patient by: Dyanne Iha, 10/13/2022, 12:08 PM

## 2022-10-13 NOTE — Progress Notes (Signed)
Patient ID: Elizabeth Stone, female   DOB: 1946/05/27, 76 y.o.   MRN: [id number] Met with the patient and her son to review current situation, rehab process, team conference and plan of care. Reviewed home situation; discussing home with son temporarily to avoid 8 ste and have assistance. Patient reports she does not feel any difference other than deconditioned ; feels weak.  Reviewed medications, dietary modification recommendations; back pain, treated with ice, MD ordered aquathermia. Reported usually gets up OOB to chair in the evening when her back is hurting and uses ice for pain control. Continue to follow along to address educational needs to facilitate preparation for discharge. Margarito Liner

## 2022-10-13 NOTE — Plan of Care (Signed)
  Problem: RH Expression Communication Goal: LTG Patient will increase speech intelligibility (SLP) Description: LTG: Patient will increase speech intelligibility at word/phrase/conversation level with cues, % of the time (SLP) Flowsheets (Taken 10/13/2022 1448) LTG: Patient will increase speech intelligibility (SLP): Supervision Level: (sentence) Other (Comment) Percent of time patient will use intelligible speech: 80%   Problem: RH Attention Goal: LTG Patient will demonstrate this level of attention during functional activites (SLP) Description: LTG:  Patient will will demonstrate this level of attention during functional activites (SLP) Flowsheets (Taken 10/13/2022 1448) Patient will demonstrate during cognitive/linguistic activities the attention type of: Selective Patient will demonstrate this level of attention during cognitive/linguistic activities in: Controlled LTG: Patient will demonstrate this level of attention during cognitive/linguistic activities with assistance of (SLP): Supervision   Problem: RH Memory Goal: LTG Patient will demonstrate ability for day to day (SLP) Description: LTG:   Patient will demonstrate ability for day to day recall/carryover during cognitive/linguistic activities with assist  (SLP) Flowsheets (Taken 10/13/2022 1448) LTG: Patient will demonstrate ability for day to day recall:  New information  Daily complex information LTG: Patient will demonstrate ability for day to day recall/carryover during cognitive/linguistic activities with assist (SLP): Supervision   Problem: RH Problem Solving Goal: LTG Patient will demonstrate problem solving for (SLP) Description: LTG:  Patient will demonstrate problem solving for basic/complex daily situations with cues  (SLP) Flowsheets (Taken 10/13/2022 1448) LTG: Patient will demonstrate problem solving for (SLP): (mildly to complex functional) Other (comment) LTG Patient will demonstrate problem solving for:  Supervision

## 2022-10-13 NOTE — Progress Notes (Signed)
PROGRESS NOTE   Subjective/Complaints: Continues to feel fatigued Looks bright and alert and working with SLP Did great on staying in the lines with maze but continues to have blurry vision  ROS: +fatigue, blurry vision   Objective:   No results found. Recent Labs    10/12/22 0729 10/13/22 0936  WBC 7.0 6.1  HGB 14.2 14.9  HCT 40.2 41.6  PLT 148* 145*   Recent Labs    10/12/22 0729 10/13/22 0936  NA 131* 127*  K 3.7 3.8  CL 97* 91*  CO2 21* 26  GLUCOSE 122* 171*  BUN 19 10  CREATININE 0.78 0.58  CALCIUM 9.9 10.2    Intake/Output Summary (Last 24 hours) at 10/13/2022 1207 Last data filed at 10/12/2022 1846 Gross per 24 hour  Intake 540 ml  Output --  Net 540 ml        Physical Exam: Vital Signs Blood pressure 123/72, pulse 81, temperature 98.1 F (36.7 C), temperature source Oral, resp. rate 16, SpO2 100 %. Gen: no distress, normal appearing, BMI 31.99 HEENT: oral mucosa pink and moist, NCAT Cardio: Reg rate Chest: normal effort, normal rate of breathing Abd: soft, non-distended Ext: no edema Psych: pleasant, normal affect Skin: intact Neuro: Alert and oriented x3  Was able to stay in lines when completing mae task Musculoskeletal: 5/5 strength throughout      Assessment/Plan: 1. Functional deficits which require 3+ hours per day of interdisciplinary therapy in a comprehensive inpatient rehab setting. Physiatrist is providing close team supervision and 24 hour management of active medical problems listed below. Physiatrist and rehab team continue to assess barriers to discharge/monitor patient progress toward functional and medical goals  Care Tool:  Bathing              Bathing assist       Upper Body Dressing/Undressing Upper body dressing        Upper body assist      Lower Body Dressing/Undressing Lower body dressing            Lower body assist        Toileting Toileting    Toileting assist       Transfers Chair/bed transfer  Transfers assist           Locomotion Ambulation   Ambulation assist              Walk 10 feet activity   Assist           Walk 50 feet activity   Assist           Walk 150 feet activity   Assist           Walk 10 feet on uneven surface  activity   Assist           Wheelchair     Assist               Wheelchair 50 feet with 2 turns activity    Assist            Wheelchair 150 feet activity     Assist          Blood pressure 123/72,  pulse 81, temperature 98.1 F (36.7 C), temperature source Oral, resp. rate 16, SpO2 100 %.  Medical Problem List and Plan: 1. Functional deficits secondary to stroke like symptoms s/p TNK             -patient may shower             -ELOS/Goals: 5-7 days S             Continue CIR 2.  Antithrombotics: -DVT/anticoagulation:  Mechanical: Sequential compression devices, entire leg Bilateral lower extremities             -antiplatelet therapy: aspirin '81mg'$  daily 3. Neuropathy: continue cymbalta '60mg'$  BID.  4. Anxiety: decrease klonopin to '1mg'$  HS 5. Neuropsych/cognition: This patient is capable of making decisions on her own behalf. 6. HTN: continue cozaar '100mg'$  daily, HCTZ '25mg'$  daily, and Norvasc '5mg'$  dauly. Magnesium gluconate '250mg'$  ordered HS 7. HLD: atorvastain increased to '20mg'$  as LDL is 77 and goal is <70 8. Hearing impaired: has cochlear implant in left ear 9. 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 Fibromyalgia: on dilaudid, discuss current use of dilaudid 12. Fatigue: add vitamin B and B supplements 13. Suboptimal vitamin D: start ergocalciferol 50,000U once per week for 7 weeks 14. Blurry vision, discussed and has been stable 15. Hyponatremia: Na dropped back down to 127, no new symptoms, fluid restrict to 1851m and repeat  tomorrow.  16. Low back pain: kpad ordered    LOS: 2 days A FACE TO FACE EVALUATION WAS PERFORMED  KMartha ClanP Zoye Chandra 10/13/2022, 12:07 PM

## 2022-10-13 NOTE — Progress Notes (Signed)
Inpatient Rehabilitation Care Coordinator Assessment and Plan Patient Details  Name: Elizabeth Stone MRN: [id number] Date of Birth: 01/11/1946  Today's Date: 10/13/2022  Hospital Problems: Principal Problem:   CVA (cerebral vascular accident) [facility])  Past Medical History:  Past Medical History:  Diagnosis Date   Acute blood loss anemia    hx   Anxiety    Arthritis    Balance problem    Bipolar disorder ([place])    Cochlear implant in place    Cochlear implant in place    right ear   Depression    Dermatitis    on face   Dyspnea    Fibromyalgia    GERD (gastroesophageal reflux disease)    Hard of hearing    Hearing aid worn    left ear   Hearing impaired    has cochlear implant   HTN (hypertension)    Hypokalemia    Meniere disease    takes HCTZ for her inner ear problem   Mixed hyperlipidemia 09/27/2013   Osteoporosis    Pneumonia 11/2016   Pre-diabetes    Primary localized osteoarthritis of left knee 11/16/2018   Primary localized osteoarthritis of right knee    Slow transit constipation    Unsteady gait    Past Surgical History:  Past Surgical History:  Procedure Laterality Date   ABDOMINAL HYSTERECTOMY  1986   bladder tack  2007   COCHLEAR IMPLANT     2007   COLONOSCOPY     COLONOSCOPY     HAMMER TOE SURGERY  2014   left foot   REVERSE SHOULDER ARTHROPLASTY Right 12/13/2014   dr Tamera Punt   REVERSE SHOULDER ARTHROPLASTY Right 12/13/2014   Procedure: REVERSE SHOULDER ARTHROPLASTY rt;  Surgeon: Nita Sells, MD;  Location: [place];  Service: Orthopedics;  Laterality: Right;  Right reverse total shoulder replacement   REVERSE SHOULDER ARTHROPLASTY Left 01/07/2017   Procedure: REVERSE LEFT TOTAL SHOULDER ARTHROPLASTY;  Surgeon: Tania Ade, MD;  Location: [place];  Service: Orthopedics;  Laterality: Left;  Left reveres total shoulder arthroplasty   TOTAL KNEE ARTHROPLASTY Right 12/16/2015   Procedure: TOTAL KNEE ARTHROPLASTY;  Surgeon: Elsie Saas, MD;   Location: [place];  Service: Orthopedics;  Laterality: Right;   TOTAL KNEE ARTHROPLASTY Left 11/28/2018   Procedure: LEFT TOTAL KNEE ARTHROPLASTY;  Surgeon: Elsie Saas, MD;  Location: [place];  Service: Orthopedics;  Laterality: Left;   Social History:  reports that she quit smoking about 29 years ago. She has a 5.75 pack-year smoking history. She has never used smokeless tobacco. She reports current alcohol use. She reports that she does not use drugs.  Family / Support Systems Children: [name] (son) Dionne Milo (daughter) Anticipated Caregiver: son. DIL and church friends Ability/Limitations of Caregiver: son works. DIL will be avaliable Family Dynamics: support from children and church family  Social History Preferred language: English Religion: [employer] - How often do you need to have someone help you when you read instructions, pamphlets, or other written material from your doctor or pharmacy?: Never Writes: Yes   Abuse/Neglect Abuse/Neglect Assessment Can Be Completed: Yes Physical Abuse: Denies Verbal Abuse: Denies Sexual Abuse: Denies Exploitation of patient/patient's resources: Denies Self-Neglect: Denies  Patient response to: Social Isolation - How often do you feel lonely or isolated from those around you?: Never  Emotional Status Recent Psychosocial Issues: coping Psychiatric History: hx of depression, anxiety and bi polar Substance Abuse History: none  Patient / Family Perceptions, Expectations & Goals Pt/Family understanding of  illness & functional limitations: yes Premorbid pt/family roles/activities: MOD I with RW and driving Anticipated changes in roles/activities/participation: Son and DIL able to assist and support pt and discharge Pt/family expectations/goals: MOD I/Supervision  [employer]: None Premorbid Home Care/DME Agencies: Other (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, 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) Transportation available at discharge: son or DIL Is  the patient able to respond to transportation needs?: Yes In the past 12 months, has lack of transportation kept you from medical appointments or from getting medications?: No In the past 12 months, has lack of transportation kept you from meetings, work, or from getting things needed for daily living?: No Resource referrals recommended: Neuropsychology  Discharge Planning Living Arrangements: Alone Support Systems: Children, Social worker community Type of Residence: Private residence Insurance Resources: Multimedia programmer (specify) Forensic psychologist Medicare) Financial Resources: Radio broadcast assistant Screen Referred: No Living Expenses: Own Money Management: Patient, Family Does the patient have any problems obtaining your medications?: No Home Management: Independent Patient/Family Preliminary Plans: Son able to assist as needed Care Coordinator Anticipated Follow Up Needs: HH/OP Expected length of stay: 10-12 Days  Clinical Impression SW met with patient and son, [name] in room and provided conference updates. Patient anticipates discharging home with assistance from her son and DIL. Son does work but DIL will be available in addition to [employer]. Sw will discuss updates with son and patient tomorrow. No additional questions or concerns.   Dyanne Iha 10/13/2022, 2:09 PM

## 2022-10-13 NOTE — Discharge Instructions (Addendum)
Inpatient Rehab Discharge Instructions  Elizabeth Stone Discharge date and time: No discharge date for patient encounter.   Activities/Precautions/ Functional Status: Activity: activity as tolerated Diet: regular diet Wound Care: Routine skin checks Functional status:  ___ No restrictions     ___ Walk up steps independently ___ 24/7 supervision/assistance   ___ Walk up steps with assistance ___ Intermittent supervision/assistance  ___ Bathe/dress independently ___ Walk with walker     _x__ Bathe/dress with assistance ___ Walk Independently    ___ Shower independently ___ Walk with assistance    ___ Shower with assistance ___ No alcohol     ___ Return to work/school ________  COMMUNITY REFERRALS UPON DISCHARGE:    Home Health:   PT     OT     ST                      Agency: [facility]  Phone: [phone]     Special Instructions: No driving smoking or alcoholSTROKE/TIA DISCHARGE INSTRUCTIONS SMOKING Cigarette smoking nearly doubles your risk of having a stroke & is the single most alterable risk factor  If you smoke or have smoked in the last 12 months, you are advised to quit smoking for your health. Most of the excess cardiovascular risk related to smoking disappears within a year of stopping. Ask you doctor about anti-smoking medications [place] Quit Line: 1-800-QUIT NOW Free Smoking Cessation Classes [phone]  CHOLESTEROL Know your levels; limit fat & cholesterol in your diet  Lipid Panel     Component Value Date/Time   CHOL 149 10/08/2022 0658   TRIG 58 10/08/2022 0658   HDL 60 10/08/2022 0658   CHOLHDL 2.5 10/08/2022 0658   VLDL 12 10/08/2022 0658   LDLCALC 77 10/08/2022 0658     Many patients benefit from treatment even if their cholesterol is at goal. Goal: Total Cholesterol (CHOL) less than 160 Goal:  Triglycerides (TRIG) less than 150 Goal:  HDL greater than 40 Goal:  LDL (LDLCALC) less than 100   BLOOD PRESSURE American Stroke Association blood pressure target is  less that 120/80 mm/Hg  Your discharge blood pressure is:  BP: 123/72 Monitor your blood pressure Limit your salt and alcohol intake Many individuals will require more than one medication for high blood pressure  DIABETES (A1c is a blood sugar average for last 3 months) Goal HGBA1c is under 7% (HBGA1c is blood sugar average for last 3 months)  Diabetes: No known diagnosis of diabetes    Lab Results  Component Value Date   HGBA1C 5.4 10/07/2022    Your HGBA1c can be lowered with medications, healthy diet, and exercise. Check your blood sugar as directed by your physician Call your physician if you experience unexplained or low blood sugars.  PHYSICAL ACTIVITY/REHABILITATION Goal is 30 minutes at least 4 days per week  Activity: Increase activity slowly, Therapies: Physical Therapy: Home Health Return to work:  Activity decreases your risk of heart attack and stroke and makes your heart stronger.  It helps control your weight and blood pressure; helps you relax and can improve your mood. Participate in a regular exercise program. Talk with your doctor about the best form of exercise for you (dancing, walking, swimming, cycling).  DIET/WEIGHT Goal is to maintain a healthy weight  Your discharge diet is:  Diet Order             Diet Heart Room service appropriate? No; Fluid consistency: Thin  Diet effective now  liquids Your height is:    Your current weight is:   Your Body Mass Index (BMI) is:    Following the type of diet specifically designed for you will help prevent another stroke. Your goal weight range is:   Your goal Body Mass Index (BMI) is 19-24. Healthy food habits can help reduce 3 risk factors for stroke:  High cholesterol, hypertension, and excess weight.  RESOURCES Stroke/Support Group:  Call [phone]   STROKE EDUCATION PROVIDED/REVIEWED AND GIVEN TO PATIENT Stroke warning signs and symptoms How to activate emergency medical system (call  [phone]). Medications prescribed at discharge. Need for follow-up after discharge. Personal risk factors for stroke. Pneumonia vaccine given: No Flu vaccine given: No My questions have been answered, the writing is legible, and I understand these instructions.  I will adhere to these goals & educational materials that have been provided to me after my discharge from the hospital.      My questions have been answered and I understand these instructions. I will adhere to these goals and the provided educational materials after my discharge from the hospital.  Patient/Caregiver Signature _______________________________ Date __________  Clinician Signature _______________________________________ Date __________  Please bring this form and your medication list with you to all your follow-up doctor's appointments.

## 2022-10-13 NOTE — Evaluation (Signed)
Physical Therapy Assessment and Plan  Patient Details  Name: Elizabeth Stone MRN: [id number] Date of Birth: 02-07-1946  PT Diagnosis: Abnormal posture, Abnormality of gait, Cognitive deficits, Difficulty walking, Impaired cognition, Impaired sensation, Muscle weakness, and Pain in low back Rehab Potential: Good ELOS: 7-10 days   Today's Date: 10/13/2022 PT Individual Time: 8413-2440 PT Individual Time Calculation (min): 68 min    Hospital Problem: Principal Problem:   CVA (cerebral vascular accident) [facility])   Past Medical History:  Past Medical History:  Diagnosis Date   Acute blood loss anemia    hx   Anxiety    Arthritis    Balance problem    Bipolar disorder ([place])    Cochlear implant in place    Cochlear implant in place    right ear   Depression    Dermatitis    on face   Dyspnea    Fibromyalgia    GERD (gastroesophageal reflux disease)    Hard of hearing    Hearing aid worn    left ear   Hearing impaired    has cochlear implant   HTN (hypertension)    Hypokalemia    Meniere disease    takes HCTZ for her inner ear problem   Mixed hyperlipidemia 09/27/2013   Osteoporosis    Pneumonia 11/2016   Pre-diabetes    Primary localized osteoarthritis of left knee 11/16/2018   Primary localized osteoarthritis of right knee    Slow transit constipation    Unsteady gait    Past Surgical History:  Past Surgical History:  Procedure Laterality Date   ABDOMINAL HYSTERECTOMY  1986   bladder tack  2007   COCHLEAR IMPLANT     2007   COLONOSCOPY     COLONOSCOPY     HAMMER TOE SURGERY  2014   left foot   REVERSE SHOULDER ARTHROPLASTY Right 12/13/2014   dr Tamera Punt   REVERSE SHOULDER ARTHROPLASTY Right 12/13/2014   Procedure: REVERSE SHOULDER ARTHROPLASTY rt;  Surgeon: Nita Sells, MD;  Location: [place];  Service: Orthopedics;  Laterality: Right;  Right reverse total shoulder replacement   REVERSE SHOULDER ARTHROPLASTY Left 01/07/2017   Procedure: REVERSE LEFT  TOTAL SHOULDER ARTHROPLASTY;  Surgeon: Tania Ade, MD;  Location: [place];  Service: Orthopedics;  Laterality: Left;  Left reveres total shoulder arthroplasty   TOTAL KNEE ARTHROPLASTY Right 12/16/2015   Procedure: TOTAL KNEE ARTHROPLASTY;  Surgeon: Elsie Saas, MD;  Location: [place];  Service: Orthopedics;  Laterality: Right;   TOTAL KNEE ARTHROPLASTY Left 11/28/2018   Procedure: LEFT TOTAL KNEE ARTHROPLASTY;  Surgeon: Elsie Saas, MD;  Location: [place];  Service: Orthopedics;  Laterality: Left;    Assessment & Plan Clinical Impression: Patient is a 76 y.o. year old female who was admitted with stroke like symptoms s/p TNK who has continued impaired speech, intermittent headache, blurry vision, and diplopia. Admitting to CIR on 12/24 with above deficits. Patient transferred to CIR on 10/11/2022 .   Patient currently requires min assist with mobility secondary to muscle weakness, decreased cardiorespiratoy endurance, impaired timing and sequencing and unbalanced muscle activation, decreased midline orientation, decreased problem solving and decreased memory, and decreased standing balance, decreased postural control, and decreased balance strategies.  Prior to hospitalization, patient was modified independent  with mobility and lived with Alone in a House (condo) home.  Home access is 8Stairs to enter.  Patient will benefit from skilled PT intervention to maximize safe functional mobility, minimize fall risk, and decrease caregiver burden for planned discharge  home with 24 hour supervision.  Anticipate patient will benefit from follow up [place] at discharge.  PT - End of Session Activity Tolerance: Tolerates 30+ min activity with multiple rests Endurance Deficit: Yes Endurance Deficit Description: requires seated rest breaks PT Assessment Rehab Potential (ACUTE/IP ONLY): Good PT Barriers to Discharge: Home environment access/layout;Inaccessible home environment PT Patient demonstrates  impairments in the following area(s): Balance;Perception;Behavior;Safety;Edema;Sensory;Endurance;Skin Integrity;Motor;Nutrition;Pain PT Transfers Functional Problem(s): Bed Mobility;Bed to Chair;Car;Furniture;Floor PT Locomotion Functional Problem(s): Ambulation;Stairs PT Plan PT Intensity: Minimum of 1-2 x/day ,45 to 90 minutes PT Frequency: 5 out of 7 days PT Duration Estimated Length of Stay: 7-10 days PT Treatment/Interventions: Ambulation/gait training;Community reintegration;DME/adaptive equipment instruction;Neuromuscular re-education;Psychosocial support;Stair training;UE/LE Strength taining/ROM;Balance/vestibular training;Discharge planning;Functional electrical stimulation;Pain management;Skin care/wound management;Therapeutic Activities;UE/LE Coordination activities;Cognitive remediation/compensation;Disease management/prevention;Functional mobility training;Patient/family education;Splinting/orthotics;Therapeutic Exercise;Visual/perceptual remediation/compensation PT Transfers Anticipated Outcome(s): mod-I using LRAD PT Locomotion Anticipated Outcome(s): supervision using LRAD PT Recommendation Recommendations for Other Services: Therapeutic Recreation consult Therapeutic Recreation Interventions: Kitchen group Follow Up Recommendations: Home health PT;24 hour supervision/assistance Patient destination: Home Equipment Recommended: To be determined   PT Evaluation Precautions/Restrictions Precautions Precautions: Fall Precaution Comments: h/o falls, Menierre's Disease s/p cochlear implant Restrictions Weight Bearing Restrictions: No Pain Pain Assessment Pain Scale: 0-10 Pain Score: 7  Pain Type: Chronic pain Pain Location: Back Pain Orientation: Lower Pain Radiating Towards: legs Pain Descriptors / Indicators: Aching;Throbbing Pain Onset: On-going Patients Stated Pain Goal: 3 Pain Intervention(s): Medication (See eMAR);Rest;Repositioned;Ambulation/increased  activity Pain Interference Pain Interference Pain Effect on Sleep: 3. Frequently Pain Interference with Therapy Activities: 2. Occasionally Pain Interference with Day-to-Day Activities: 3. Frequently Home Living/Prior Functioning Home Living Living Arrangements: Alone Available Help at Discharge: Available 24 hours/day (per pt's son, [name], family planning to provide 24hr support at D/C with pt either D/Cing to her home or to her son's home) Type of Home: Apartment Home Access: Stairs to enter [employer] of Steps: 9 Entrance Stairs-Rails: Right;Left (too wide to reach) Home Layout: One level Bathroom Shower/Tub: Multimedia programmer: Handicapped height Bathroom Accessibility: Yes Additional Comments: Life alert  Lives With: Alone Prior Function Level of Independence: Independent with gait;Independent with transfers;Requires assistive device for independence (pt reports she would intermittently use RW on days she didn't feel stable but otherwise would furniture walk in the home; she would used motorized buggy at grocery store; would walk in/out of church building on Sundays)  Able to Take Stairs?: Yes Driving: Yes Vocation: On disability Vocation Requirements: was driving to/from church and grocery store independently Vision/Perception  Vision - History Ability to See in Adequate Light: 0 Adequate Vision - Assessment Eye Alignment: Within Functional Limits Ocular Range of Motion: Within Functional Limits;Other (comment) Alignment/Gaze Preference: Within Defined Limits Tracking/Visual Pursuits: Able to track stimulus in all quads without difficulty Perception Perception: Impaired Spatial Orientation: posterior lean bias with impaired midline orientation Praxis Praxis: Impaired Praxis Impairment Details: Motor planning  Cognition Overall Cognitive Status: Impaired/Different from baseline Arousal/Alertness: Awake/alert Orientation Level: Oriented X4 Year:  2023 Month: December Day of Week: Correct Attention: Focused;Sustained Focused Attention: Appears intact Sustained Attention: Appears intact Memory: Impaired Behaviors: Impulsive Safety/Judgment: Appears intact Sensation Sensation Light Touch: Impaired Detail Peripheral sensation comments: impaired distally with pt reporting feels numb in her feet - appears to be decreased throughout bilateral LEs Light Touch Impaired Details: Impaired RLE;Impaired LLE Hot/Cold: Not tested Proprioception: Impaired by gross assessment (appears to have grossly impaired awareness of B LE placement/positioning during mobility tasks) Stereognosis: Not tested Coordination Gross Motor Movements are Fluid and Coordinated: No Fine Motor  Movements are Fluid and Coordinated: No Coordination and Movement Description: GM movements impaired due to generalized weakness, impaired midline orientation, and overall impaired endurance Finger Nose Finger Test: Grossly overshooting LUE Motor  Motor Motor: Abnormal postural alignment and control;Other (comment) Motor - Skilled Clinical Observations: impaired due to generalized weakness, impaired midline orientation, and overall impaired endurance   Trunk/Postural Assessment  Cervical Assessment Cervical Assessment: Within Functional Limits Thoracic Assessment Thoracic Assessment: Exceptions to [facility] (slight rounded shoulders) Lumbar Assessment Lumbar Assessment: Exceptions to [facility] (flexible posterior pelvic tilt) Postural Control Postural Control: Deficits on evaluation Protective Responses: delayed and inadequate in standing Postural Limitations: decreased in standing  Balance Balance Balance Assessed: Yes Static Sitting Balance Static Sitting - Balance Support: Feet supported Static Sitting - Level of Assistance: 5: Stand by assistance Dynamic Sitting Balance Dynamic Sitting - Balance Support: Feet supported Dynamic Sitting - Level of Assistance: 5: Stand by  assistance;Other (comment) (CGA) Static Standing Balance Static Standing - Balance Support: During functional activity;Bilateral upper extremity supported Static Standing - Level of Assistance: Other (comment) (CGA) Dynamic Standing Balance Dynamic Standing - Balance Support: During functional activity Dynamic Standing - Level of Assistance: 4: Min assist;3: Mod assist Extremity Assessment  RLE Assessment RLE Assessment: Exceptions to [facility] Active Range of Motion (AROM) Comments: WFL/WNL General Strength Comments: assessed in sitting RLE Strength Right Hip Flexion: 4-/5 Right Knee Flexion: 3+/5 Right Knee Extension: 4/5 Right Ankle Dorsiflexion: 4/5 Right Ankle Plantar Flexion: 4/5 LLE Assessment LLE Assessment: Exceptions to [facility] Active Range of Motion (AROM) Comments: WFL/WNL General Strength Comments: assessed in sitting LLE Strength Left Hip Flexion: 4/5 Left Knee Flexion: 4/5 Left Knee Extension: 4/5 Left Ankle Dorsiflexion: 4/5 Left Ankle Plantar Flexion: 4/5  Care Tool Care Tool Bed Mobility Roll left and right activity   Roll left and right assist level: Independent with assistive device    Sit to lying activity   Sit to lying assist level: Supervision/Verbal cueing    Lying to sitting on side of bed activity   Lying to sitting on side of bed assist level: the ability to move from lying on the back to sitting on the side of the bed with no back support.: Supervision/Verbal cueing     Care Tool Transfers Sit to stand transfer   Sit to stand assist level: Minimal Assistance - Patient > 75%    Chair/bed transfer   Chair/bed transfer assist level: Minimal Assistance - Patient > 75%     Physiological scientist transfer assist level: Minimal Assistance - Patient > 75%      Care Tool Locomotion Ambulation   Assist level: Minimal Assistance - Patient > 75% Assistive device: Walker-rolling Max distance: 102f  Walk 10 feet activity   Assist  level: Minimal Assistance - Patient > 75% Assistive device: Walker-rolling   Walk 50 feet with 2 turns activity Walk 50 feet with 2 turns activity did not occur: Safety/medical concerns      Walk 150 feet activity Walk 150 feet activity did not occur: Safety/medical concerns      Walk 10 feet on uneven surfaces activity Walk 10 feet on uneven surfaces activity did not occur: Safety/medical concerns      Stairs Stair activity did not occur: Safety/medical concerns        Walk up/down 1 step activity Walk up/down 1 step or curb (drop down) activity did not occur: Safety/medical concerns      Walk up/down 4  steps activity Walk up/down 4 steps activity did not occur: Safety/medical concerns      Walk up/down 12 steps activity Walk up/down 12 steps activity did not occur: Safety/medical concerns      Pick up small objects from floor   Pick up small object from the floor assist level: Moderate Assistance - Patient 50 - 74%    Wheelchair Is the patient using a wheelchair?: Yes (pt with limited walking distance prior to hospitalization but did not require use of w/c for her functional mobility in the home - now using for transport to/from gym as this is further than pt walks at baseline)     Wheelchair assist level: Dependent - Patient 0%    Wheel 50 feet with 2 turns activity   Assist Level: Dependent - Patient 0%  Wheel 150 feet activity   Assist Level: Dependent - Patient 0%    Refer to Care Plan for Long Term Goals  SHORT TERM GOAL WEEK 1 PT Short Term Goal 1 (Week 1): = to LTGs based on ELOS  Recommendations for other services: Therapeutic Recreation  Kitchen group  Skilled Therapeutic Intervention Pt received sitting in recliner with her son, [name], present and pt agreeable to therapy session. Evaluation completed (see details above) with patient education regarding purpose of PT evaluation, PT POC and goals, therapy schedule, weekly team meetings, and other CIR  information including safety plan and fall risk safety. Pt performed the below functional mobility tasks with the specified levels of skilled cuing and assistance. Pt reports she feels that her greatest mobility change is that her LEs feel weak making her feel more imbalanced. Pt appears and reports feeling "unstable" when walking demonstrating guarded upper body posturing and tendency for minor posterior lean while keeping AD too far anterior. At end of session, pt left seated in recliner with her needs in reach, family present with Nursing Care Coordinator, and chair alarm on.  Mobility Bed Mobility Bed Mobility: Supine to Sit;Sit to Supine Rolling Right: Supervision/verbal cueing Supine to Sit: Supervision/Verbal cueing Sit to Supine: Supervision/Verbal cueing Transfers Transfers: Sit to Stand;Stand Pivot Transfers;Stand to Sit Sit to Stand: Minimal Assistance - Patient > 75% Stand to Sit: Minimal Assistance - Patient > 75% Stand Pivot Transfers: Minimal Assistance - Patient > 75% Stand Pivot Transfer Details: Tactile cues for sequencing;Tactile cues for posture;Verbal cues for precautions/safety;Verbal cues for sequencing;Verbal cues for gait pattern;Verbal cues for technique;Tactile cues for weight shifting;Tactile cues for placement Stand Pivot Transfer Details (indicate cue type and reason): pt often will stop moving her feet, not turning fully, and then have to pivot hips as she lowers with poor eccentric control into the seat Transfer (Assistive device): None Locomotion  Gait Ambulation: Yes Gait Assistance: Minimal Assistance - Patient > 75% Gait Distance (Feet): 46 Feet (x2) Assistive device: 1 person hand held assist;Rolling walker (1st walk with R HHA transitiond to using RW during 2nd walk) Gait Assistance Details: Verbal cues for precautions/safety;Verbal cues for sequencing;Verbal cues for safe use of DME/AE;Manual facilitation for weight shifting;Other (comment);Tactile cues  for weight shifting;Tactile cues for posture;Tactile cues for sequencing;Verbal cues for technique;Verbal cues for gait pattern (manual facilitation for safe AD management as pt tends to push it too far forward) Gait Gait: Yes Gait Pattern: Impaired Gait Pattern: Step-through pattern;Decreased step length - left;Decreased step length - right;Narrow base of support (slight step-through; holds trunk in a guarded posture due to fear of falling) Gait velocity: Decreased Stairs / Additional Locomotion Stairs: No (pt reports  feeling unsafe to attempt them today) Wheelchair Mobility Wheelchair Mobility: No   Discharge Criteria: Patient will be discharged from PT if patient refuses treatment 3 consecutive times without medical reason, if treatment goals not met, if there is a change in medical status, if patient makes no progress towards goals or if patient is discharged from hospital.  The above assessment, treatment plan, treatment alternatives and goals were discussed and mutually agreed upon: by patient and by family  Tawana Scale , PT, DPT, NCS, CSRS 10/13/2022, 12:29 PM

## 2022-10-14 DIAGNOSIS — I639 Cerebral infarction, unspecified: Secondary | ICD-10-CM | POA: Diagnosis not present

## 2022-10-14 LAB — CBC
HCT: 39.6 % (ref 36.0–46.0)
Hemoglobin: 13.8 g/dL (ref 12.0–15.0)
MCH: 33.3 pg (ref 26.0–34.0)
MCHC: 34.8 g/dL (ref 30.0–36.0)
MCV: 95.4 fL (ref 80.0–100.0)
Platelets: 148 10*3/uL — ABNORMAL LOW (ref 150–400)
RBC: 4.15 MIL/uL (ref 3.87–5.11)
RDW: 12.9 % (ref 11.5–15.5)
WBC: 6.4 10*3/uL (ref 4.0–10.5)
nRBC: 0 % (ref 0.0–0.2)

## 2022-10-14 LAB — BASIC METABOLIC PANEL
Anion gap: 10 (ref 5–15)
BUN: 11 mg/dL (ref 8–23)
CO2: 24 mmol/L (ref 22–32)
Calcium: 9.5 mg/dL (ref 8.9–10.3)
Chloride: 93 mmol/L — ABNORMAL LOW (ref 98–111)
Creatinine, Ser: 0.62 mg/dL (ref 0.44–1.00)
GFR, Estimated: >60 mL/min (ref >60)
Glucose, Bld: 127 mg/dL — ABNORMAL HIGH (ref 70–99)
Potassium: 3.6 mmol/L (ref 3.5–5.1)
Sodium: 127 mmol/L — ABNORMAL LOW (ref 135–145)

## 2022-10-14 NOTE — Progress Notes (Signed)
Occupational Therapy Session Note  Patient Details  Name: Elizabeth Stone MRN: [id number] Date of Birth: [birth year]  Today's Date: 10/14/2022 OT Individual Time: 5170-0174 OT Individual Time Calculation (min): 45 min    Short Term Goals: Week 1:   no STGs  Skilled Therapeutic Interventions/Progress Updates:    Pt received in bed dressed and ready, pt sat to EOB with no A. Pt then used RW to ambulate to sink to brush teeth. Along the way stopped to pick up tissue box from the floor using RW for support with no LOB.  Pt stood at sink and completed oral care.  After standing several minutes, pt did seem to get short of breath, but O2 sats WNL.  Pt stated she did not feel short of breath.    Pt sat to rest while I obtained new sheets for the bed.  Pt worked on making the bed with mod A.  Pt stated her son helps her with this task at home due to her history of back pain.   Pt stated she has been struggling with B hand weakness due to arthritis.  Provided pt with green   foam block and soft yellow theraputty.  Pt worked on hand exercises.  Pt resting in recliner with seat alarm on and all needs met.   Therapy Documentation Precautions:  Precautions Precautions: Fall Precaution Comments: h/o falls, Menierre's Disease s/p cochlear implant Restrictions Weight Bearing Restrictions: No   Pain: c/o low back after standing several minutes,relieved with sitting        Therapy/Group: Individual Therapy  Chanze Teagle 10/14/2022, 8:20 AM

## 2022-10-14 NOTE — Progress Notes (Signed)
Occupational Therapy Session Note  Patient Details  Name: Elizabeth Stone MRN: [id number] Date of Birth: 13-Nov-1945  Today's Date: 10/14/2022 OT Individual Time: 1035-1120 OT Individual Time Calculation (min): 45 min    Short Term Goals: Week 1:  STG= LTGS   Skilled Therapeutic Interventions/Progress Updates:  Pt greeted supine, pt agreeable to OT intervention. Session focus on BADL reeducation, functional mobility, dynamic standing balance and decreasing overall caregiver burden.     Pt completed supine>sit with CGA with use of bed features. Pt donned shoes from EOB with set- up assist. Pt completed stand pivot to w/c with Rw and CGA. Total A transport to gym in w/c for time mgmt.         Worked on dynamic standing balance with pt standing on airex cushion to challenge balance with pt reaching out of BOS to BITs to tap stimulus with BUEs, pt required unilateral support from RW with pt needing MIN A for balance during task, score indicated below:  93% accuracy, 2 mins, 2.13 reaction time, 4 misses, slowest on L upper using RUE     Further challenged dynamic balance with pt reaching out of BOS to place/remove squigz on mirror with an emphasis on dynamic reaching, pt completed task with unilateral support with MINA.     Worked on dynamic balance with pt instructed to step up/down on aerobic step with BUE support on RW, pt completed task with MINA, minor LOB during task however pt presents with good balance reactions.  Pt completed functional ambulation towards room ~ 49f with Rw and CGA with chair follow. Pt completed ambulatory toilet transfer with CGA with Rw, pt completed 3/3 toileting tasks with supervision.      Ended session with pt supine in bed with all needs within reach and bed alarm activated.                    Therapy Documentation Precautions:  Precautions Precautions: Fall Precaution Comments: h/o falls, Menierre's Disease s/p cochlear implant Restrictions Weight  Bearing Restrictions: No   Pain: no pain     Therapy/Group: Individual Therapy  [name]12/27/2023, 12:28 PM

## 2022-10-14 NOTE — IPOC Note (Signed)
Overall Plan of Care [facility]) Patient Details Name: Elizabeth Stone MRN: [id number] DOB: 1946/04/06  Admitting Diagnosis: CVA (cerebral vascular accident) [facility])  Hospital Problems: Principal Problem:   CVA (cerebral vascular accident) [facility])     Functional Problem List: Nursing Bowel, Pain, Medication Management, Behavior  PT Balance, Perception, Behavior, Safety, Edema, Sensory, Endurance, Skin Integrity, Motor, Nutrition, Pain  OT Balance, Motor, Cognition, Vision, Perception, Endurance, Safety  SLP Cognition  TR         Basic ADL's: OT Bathing, Dressing, Toileting     Advanced  ADL's: OT Simple Meal Preparation, Laundry, Light Housekeeping     Transfers: PT Bed Mobility, Bed to Chair, Car, [employer], Floor  OT Toilet, Tub/Shower     Locomotion: PT Ambulation, Stairs     Additional Impairments: OT    SLP Communication, Social Cognition expression Problem Solving, Memory, Attention  TR      Anticipated Outcomes Item Anticipated Outcome  Self Feeding    Swallowing      Basic self-care  Mod I  Toileting  Mod I   Bathroom Transfers Mod I  Bowel/Bladder  Continent x2  Transfers  mod-I using LRAD  Locomotion  supervision using LRAD  Communication  Supervision A  Cognition  Supervision A  Pain  less than 4  Safety/Judgment  remain fall free while in rehab   Therapy Plan: PT Intensity: Minimum of 1-2 x/day ,45 to 90 minutes PT Frequency: 5 out of 7 days PT Duration Estimated Length of Stay: 7-10 days OT Intensity: Minimum of 1-2 x/day, 45 to 90 minutes OT Frequency: 5 out of 7 days OT Duration/Estimated Length of Stay: 5-7 days SLP Intensity: Minumum of 1-2 x/day, 30 to 90 minutes SLP Frequency: 3 to 5 out of 7 days SLP Duration/Estimated Length of Stay: 5-7 days   Team Interventions: Nursing Interventions Patient/Family Education, Disease Management/Prevention, Discharge Planning, Bowel Management, Pain Management, Psychosocial Support, Medication  Management  PT interventions Ambulation/gait training, Community reintegration, DME/adaptive equipment instruction, Neuromuscular re-education, Psychosocial support, Stair training, UE/LE Strength taining/ROM, Training and development officer, Discharge planning, Functional electrical stimulation, Pain management, Skin care/wound management, Therapeutic Activities, UE/LE Coordination activities, Cognitive remediation/compensation, Disease management/prevention, Functional mobility training, Patient/family education, Splinting/orthotics, Therapeutic Exercise, Visual/perceptual remediation/compensation  OT Interventions Balance/vestibular training, Neuromuscular re-education, Patient/family education, Self Care/advanced ADL retraining, Therapeutic Exercise, UE/LE Coordination activities, Visual/perceptual remediation/compensation, UE/LE Strength taining/ROM, Therapeutic Activities, Pain management, Functional mobility training, DME/adaptive equipment instruction, Discharge planning  SLP Interventions Cognitive remediation/compensation, Cueing hierarchy, Functional tasks, Patient/family education, Internal/external aids, Environmental controls  TR Interventions    SW/CM Interventions Discharge Planning, Patient/Family Education, Disease Management/Prevention, Psychosocial Support   Barriers to Discharge MD  Medical stability  Nursing Home environment access/layout, Lack of/limited family support, Weight, Behavior Discharge alone with help from family and friends to [place] with 8 ste entry rail on left  PT Home environment access/layout, Inaccessible home environment    OT      SLP      SW       Team Discharge Planning: Destination: PT-Home ,OT- Home , SLP-Home Projected Follow-up: PT-Home health PT, 24 hour supervision/assistance, OT-  Outpatient OT, SLP-Outpatient SLP Projected Equipment Needs: PT-To be determined, OT- To be determined, SLP-None recommended by SLP Equipment Details: PT- , OT-   Patient/family involved in discharge planning: PT- Patient, Family member/caregiver,  OT-Patient, SLP-Patient  MD ELOS: 7d Medical Rehab Prognosis:  Excellent Assessment: The patient has been admitted for CIR therapies with the diagnosis of CVA. The team will be addressing functional mobility,  strength, stamina, balance, safety, adaptive techniques and equipment, self-care, bowel and bladder mgt, patient and caregiver education, Pain and BP management. Goals have been set at Mod I. Anticipated discharge destination is Home.        See Team Conference Notes for weekly updates to the plan of care

## 2022-10-14 NOTE — Progress Notes (Signed)
PROGRESS NOTE   Subjective/Complaints:  Deaf without cochlear implant hearing aide  States her son will help her after discharge but won't be back in town until end of next week  ROS: +fatigue, blurry vision   Objective:   No results found. Recent Labs    10/13/22 0936 10/14/22 0602  WBC 6.1 6.4  HGB 14.9 13.8  HCT 41.6 39.6  PLT 145* 148*    Recent Labs    10/13/22 0936 10/14/22 0602  NA 127* 127*  K 3.8 3.6  CL 91* 93*  CO2 26 24  GLUCOSE 171* 127*  BUN 10 11  CREATININE 0.58 0.62  CALCIUM 10.2 9.5     Intake/Output Summary (Last 24 hours) at 10/14/2022 0736 Last data filed at 10/13/2022 1700 Gross per 24 hour  Intake 354 ml  Output --  Net 354 ml         Physical Exam: Vital Signs Blood pressure (!) 143/57, pulse 79, temperature 97.9 F (36.6 C), temperature source Oral, resp. rate 18, SpO2 94 %. Gen: no distress, normal appearing, BMI 31.99  General: No acute distress Mood and affect are appropriate Heart: Regular rate and rhythm no rubs murmurs or extra sounds Lungs: Clear to auscultation, breathing unlabored, no rales or wheezes Abdomen: Positive bowel sounds, soft nontender to palpation, nondistended Extremities: No clubbing, cyanosis, or edema Skin: No evidence of breakdown, no evidence of rash   Neuro: Alert and oriented x3  Was able to stay in lines when completing mae task Musculoskeletal: 5/5 strength throughout      Assessment/Plan: 1. Functional deficits which require 3+ hours per day of interdisciplinary therapy in a comprehensive inpatient rehab setting. Physiatrist is providing close team supervision and 24 hour management of active medical problems listed below. Physiatrist and rehab team continue to assess barriers to discharge/monitor patient progress toward functional and medical goals  Care Tool:  Bathing    Body parts bathed by patient: Right arm, Left arm,  Chest, Abdomen, Front perineal area, Buttocks, Right upper leg, Left upper leg, Right lower leg, Left lower leg, Face         Bathing assist Assist Level: Contact Guard/Touching assist     Upper Body Dressing/Undressing Upper body dressing   What is the patient wearing?: Bra, Pull over shirt    Upper body assist Assist Level: Minimal Assistance - Patient > 75%    Lower Body Dressing/Undressing Lower body dressing      What is the patient wearing?: Underwear/pull up, Pants     Lower body assist Assist for lower body dressing: Contact Guard/Touching assist     Toileting Toileting Toileting Activity did not occur (Clothing management and hygiene only): N/A (no void or bm)  Toileting assist       Transfers Chair/bed transfer  Transfers assist     Chair/bed transfer assist level: Minimal Assistance - Patient > 75%     Locomotion Ambulation   Ambulation assist      Assist level: Minimal Assistance - Patient > 75% Assistive device: Walker-rolling Max distance: 94f   Walk 10 feet activity   Assist     Assist level: Minimal Assistance - Patient > 75% Assistive  device: Walker-rolling   Walk 50 feet activity   Assist Walk 50 feet with 2 turns activity did not occur: Safety/medical concerns         Walk 150 feet activity   Assist Walk 150 feet activity did not occur: Safety/medical concerns         Walk 10 feet on uneven surface  activity   Assist Walk 10 feet on uneven surfaces activity did not occur: Safety/medical concerns         Wheelchair     Assist Is the patient using a wheelchair?: Yes (pt with limited walking distance prior to hospitalization but did not require use of w/c for her functional mobility in the home - now using for transport to/from gym as this is further than pt walks at baseline)      Wheelchair assist level: Dependent - Patient 0%      Wheelchair 50 feet with 2 turns activity    Assist         Assist Level: Dependent - Patient 0%   Wheelchair 150 feet activity     Assist      Assist Level: Dependent - Patient 0%   Blood pressure (!) 143/57, pulse 79, temperature 97.9 F (36.6 C), temperature source Oral, resp. rate 18, SpO2 94 %.  Medical Problem List and Plan: 1. Functional deficits secondary to stroke like symptoms s/p TNK             -patient may shower             -ELOS/Goals: 5-7 days S             Continue CIR 2.  Antithrombotics: -DVT/anticoagulation:  Mechanical: Sequential compression devices, entire leg Bilateral lower extremities             -antiplatelet therapy: aspirin '81mg'$  daily 3. Neuropathy: continue cymbalta '60mg'$  BID.  4. Anxiety: decrease klonopin to '1mg'$  HS 5. Neuropsych/cognition: This patient is capable of making decisions on her own behalf. 6. HTN: continue cozaar '100mg'$  daily, HCTZ '25mg'$  daily, and Norvasc '5mg'$  dauly. Magnesium gluconate '250mg'$  ordered HS Vitals:   10/13/22 1919 10/14/22 0233  BP: (!) 146/63 (!) 143/57  Pulse: 78 79  Resp: 18 18  Temp: 98.2 F (36.8 C) 97.9 F (36.6 C)  SpO2: 96% 94%    7. HLD: atorvastain increased to '20mg'$  as LDL is 77 and goal is <70 8. Hearing impaired: has cochlear implant in right  ear 9. 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 Fibromyalgia: on dilaudid, discuss current use of dilaudid 12. Fatigue: add vitamin B and B supplements 13. Suboptimal vitamin D: start ergocalciferol 50,000U once per week for 7 weeks 14. Blurry vision, discussed and has been stable 15. Hyponatremia: Na dropped back down to 127, no new symptoms, fluid restrict to 189m and repeat tomorrow.  16. Low back pain: kpad ordered    LOS: 3 days A FACE TO FACE EVALUATION WAS PERFORMED  ACharlett Blake12/27/2023, 7:36 AM

## 2022-10-14 NOTE — Progress Notes (Signed)
Physical Therapy Session Note  Patient Details  Name: Elizabeth Stone MRN: [id number] Date of Birth: [birth year]  Today's Date: 10/14/2022 PT Individual Time: 1340-1455 PT Individual Time Calculation (min): 75 min   Short Term Goals: Week 1:  PT Short Term Goal 1 (Week 1): = to LTGs based on ELOS  Skilled Therapeutic Interventions/Progress Updates: Pt presents semi-reclined and agreeable to beginning therapy early.  Pt transfers to EOB w/ supervision.  Pt dons shoes w/ set-up, bending over to get heel in.  PT suggested Figure-4 position for safet/balance, but pt states unable 2/2 B TKA.  Encouraged slow progression to avoid dizziness, sit before putting on shoes, as pt states LOB before.  Pt states lower surface at home which makes it easier.  Pt transfers sit to stand w/ CGA and verbal cues for hand placement.  Pt amb w/ RW to w/c w/ min A and verbal cues for step length.  Pt wheeled to small gym for energy conservation.  Pt amb w/ RW up/down ramp w/ min A and verbal cues for maintaining position w/in RW.  Pt amb multiple trials w/ RW x 65' including turns.  Pt w/ short steps and improved balance w/ cueing for step length.  Pt performed 3 x 8-10 STS w/o UE, occasional retropulsion w/o cueing for scooting and forward lean.  Pt performed toe-taps to 6" platform w/ RW, but verbal cueing for increasing WB to feet.  Pt performed standing reaching forward and to left for horseshoes and hooking over chair back (B shoulder surgeries).  Pt amb w/ L HHA x 25' w/ verbal cues for step length.  Pt wheeled to main gym and negotiated 4 steps using B hands on R raiing and min A.  Pt states has been performing in this fashion at home.  Pt required verbal and visual cues for advancing hands down railing when descending .  Pt amb x 50' w/ RW and min A before requiring w/c.  Pt then amb from hallway into room to bed w/ min A.  Pt transfers sit to supine w/ supervision.  Bed alarm on and all needs in reach, son present.      Therapy Documentation Precautions:  Precautions Precautions: Fall Precaution Comments: h/o falls, Menierre's Disease s/p cochlear implant Restrictions Weight Bearing Restrictions: No General:   Vital Signs: Therapy Vitals Temp: 98 F (36.7 C) Temp Source: Oral Pulse Rate: 72 Resp: 15 BP: (!) 143/64 Patient Position (if appropriate): Lying Oxygen Therapy SpO2: 96 % O2 Device: Room Air Pain:no c/o. Pain Assessment Faces Pain Scale: Hurts a little bit Pain Type: Chronic pain Pain Location: Back Pain Onset: On-going Pain Intervention(s): Rest Mobility:      Therapy/Group: Individual Therapy  Elizabeth Stone 10/14/2022, 3:15 PM

## 2022-10-14 NOTE — Progress Notes (Signed)
Patient ID: Elizabeth Stone, female   DOB: 1945/11/16, 76 y.o.   MRN: [id number]  Team Conference Report to Patient/Family  Team Conference discussion was reviewed with the patient and caregiver, including goals, any changes in plan of care and target discharge date.  Patient and caregiver express understanding and are in agreement.  The patient has a target discharge date of 10/21/22.  Sw met with patient and called son by telephone, no answer left detailed VM. Patient informed of progress and upcoming discharge date. Patient informed SW that son will be out of town until 10-22-2022. Sw will confirm with son once follow up is received. No additional questions or concerns.  Dyanne Iha 10/14/2022, 1:29 PM

## 2022-10-14 NOTE — Progress Notes (Signed)
Speech Language Pathology Daily Session Note  Patient Details  Name: Elizabeth Stone MRN: [id number] Date of Birth: 1945/11/18  Today's Date: 10/14/2022 SLP Individual Time: 1937-9024 SLP Individual Time Calculation (min): 46 min  Short Term Goals: Week 1: SLP Short Term Goal 1 (Week 1): STG=LTG due to short ELOS (5-7 days)  Skilled Therapeutic Interventions: Skilled ST services focused on cognitive skills. Pt demonstrated recall of novel medications, but could not recall all medication names prior to admission. SLP created current medication list, pt demonstrated recall of mostly unchanged medications as pt/SLP reviewed list. Pt demonstrated appropriate verbal problem solving of medication consumed 1-2x a day and required an initial cue for functional problem solving due to novel set up. Pt did require mod A verbal cues for recall within task and to redirect attention during several interrupts. BID pill organizer task will be completed in a an upcoming session due to limited time. Pt reports no deficits with double vision on this date. Pt was left with call bell within reach and bed alarm set. Recommend to continue [place] services.     Pain Pain Assessment Pain Score: 3  Faces Pain Scale: Hurts a little bit Pain Type: Chronic pain Pain Location: Back Pain Onset: On-going Pain Intervention(s): Rest  Therapy/Group: Individual Therapy  Damonique Brunelle  [facility] 10/14/2022, 1:51 PM

## 2022-10-15 DIAGNOSIS — I639 Cerebral infarction, unspecified: Secondary | ICD-10-CM | POA: Diagnosis not present

## 2022-10-15 LAB — BASIC METABOLIC PANEL
Anion gap: 9 (ref 5–15)
BUN: 9 mg/dL (ref 8–23)
CO2: 26 mmol/L (ref 22–32)
Calcium: 9.7 mg/dL (ref 8.9–10.3)
Chloride: 96 mmol/L — ABNORMAL LOW (ref 98–111)
Creatinine, Ser: 0.66 mg/dL (ref 0.44–1.00)
GFR, Estimated: >60 mL/min (ref >60)
Glucose, Bld: 119 mg/dL — ABNORMAL HIGH (ref 70–99)
Potassium: 3.7 mmol/L (ref 3.5–5.1)
Sodium: 131 mmol/L — ABNORMAL LOW (ref 135–145)

## 2022-10-15 LAB — CBC
HCT: 39.7 % (ref 36.0–46.0)
Hemoglobin: 14.4 g/dL (ref 12.0–15.0)
MCH: 34.4 pg — ABNORMAL HIGH (ref 26.0–34.0)
MCHC: 36.3 g/dL — ABNORMAL HIGH (ref 30.0–36.0)
MCV: 94.7 fL (ref 80.0–100.0)
Platelets: 156 10*3/uL (ref 150–400)
RBC: 4.19 MIL/uL (ref 3.87–5.11)
RDW: 13 % (ref 11.5–15.5)
WBC: 6.9 10*3/uL (ref 4.0–10.5)
nRBC: 0 % (ref 0.0–0.2)

## 2022-10-15 LAB — GLUCOSE, CAPILLARY: Glucose-Capillary: 190 mg/dL — ABNORMAL HIGH (ref 70–99)

## 2022-10-15 NOTE — Progress Notes (Signed)
Occupational Therapy Session Note  Patient Details  Name: Elizabeth Stone MRN: [id number] Date of Birth: 17-Apr-1946  Today's Date: 10/15/2022 OT Individual Time: 1430-1530 OT Individual Time Calculation (min): 60 min    Skilled Therapeutic Interventions/Progress Updates:     Pt received resting in bed in good spirits and receptive to skilled OT session. Pt reporting 8/10 pain- offered rest breaks and repositioning during session. Pt completed sit>stand and ambulated to wc CGA. Pt transported to therapy gym total A for time management. Pt participated in simulated kitchen tasks, ambulating kitchen with RW. Pt able to reach into overhead and low cabinets to retrieve lightweight bowls, plates, and cups CGA. Pt able to place items into dishwasher while maintaining balance and utilizing stable surface of counter top to balance with mod verbal cues for safety and pacing. Pt able to retrieve light and moderate weight food items from cabinet without dropping alternating using R/LUE. Pt placed items back into cabinet utilizing BUE while leaning on counter maintaining balance with CGA. Pt fatigued following activity provided seated rest break in wc.  Pt and OT discussed safety in kitchen when using RW and fall prevention strategies to keep Pt safe following d/c. Pt educated on energy conservation and importance of pacing self, taking rest breaks, completing difficult tasks early in the day, and placing often used items in easily access able location. Pt verbalized understanding and taught back three strategies to implement at home.  Pt transported back to room in wc total A. Pt requested to use restroom at end of session. Pt able to ambulated to bathroom using RW and complete 3/3 toileting tasks with CGA. Pt was left resting in bed with call bell in reach, bed alarm on, and all needs met.   Therapy Documentation Precautions:  Precautions Precautions: Fall Precaution Comments: h/o falls, Menierre's Disease s/p  cochlear implant Restrictions Weight Bearing Restrictions: No General:   Vital Signs:  Pain: Pain Assessment Pain Scale: 0-10 Pain Score: 0-No pain ADL: ADL Eating: Unable to assess Grooming: Contact guard Where Assessed-Grooming: Standing at sink Upper Body Bathing: Contact guard Where Assessed-Upper Body Bathing: Shower Lower Body Bathing: Contact guard Where Assessed-Lower Body Bathing: Shower Upper Body Dressing: Minimal assistance Where Assessed-Upper Body Dressing: Chair Lower Body Dressing: Minimal assistance Where Assessed-Lower Body Dressing: Chair Toileting: Unable to assess Toilet Transfer: Minimal assistance Toilet Transfer Method: Counselling psychologist: Grab bars Tub/Shower Transfer: Unable to assess Tub/Shower Transfer Method: Unable to assess Social research officer, government: Minimal assistance Social research officer, government Method: Heritage manager: Radio broadcast assistant ADL Comments: Patient has a shower seat at home but does not use it.  Patient's balance is biggest limiting factor for functional independence Vision   Perception    Praxis   Balance   Exercises:   Other Treatments:     Therapy/Group: Individual Therapy  Janey Genta 10/15/2022, 3:07 PM

## 2022-10-15 NOTE — Progress Notes (Signed)
Occupational Therapy Session Note  Patient Details  Name: Elizabeth Stone MRN: [id number] Date of Birth: 05/12/1946  Today's Date: 10/15/2022 OT Individual Time: 0805-0901 OT Individual Time Calculation (min): 56 min    Short Term Goals: Week 1:   STG= LTGs  Skilled Therapeutic Interventions/Progress Updates:  Pt greeted supine in bed, pt agreeable to OT intervention. Session focus on BADL reeducation, functional mobility, dynamic standing balance and decreasing overall caregiver burden.                     Pt completed supine>sit with supervision with use of bed features. Pt completed sit>stand with RW with supervision with able to ambulate to closet with RW to collect clothes for today. Pt ambulated to bathroom for toileting with supervision with RW, pt completed 3/3 toileting tasks with supervisiom, + urine void. Pt ambulated into shower with close supervision with Rw. Facilitated balance challenges in shower with pt able to stand for showering with close CGA- MINA, pt mildly unsafe during showering with pt leaning down to pick up soap often however no true LOB. Pt exited shower with close MIN A with RW. Pt sat EOB for dressing with pt completing dressing with overall CGA.   Pt stood for grooming tasks at sink with no UE support or LOB for ~ 5 mins.   Ended session with pt seated in recliner with all needs within reach.            Therapy Documentation Precautions:  Precautions Precautions: Fall Precaution Comments: h/o falls, Menierre's Disease s/p cochlear implant Restrictions Weight Bearing Restrictions: No  Pain: no pain reported    Therapy/Group: Individual Therapy  Deziah Renwick [facility] 10/15/2022, 12:06 PM

## 2022-10-15 NOTE — Progress Notes (Signed)
Speech Language Pathology Daily Session Note  Patient Details  Name: Elizabeth Stone MRN: [id number] Date of Birth: [birth year]  Today's Date: 10/15/2022 SLP Individual Time: 1400-1430 SLP Individual Time Calculation (min): 30 min  Short Term Goals: Week 1: SLP Short Term Goal 1 (Week 1): STG=LTG due to short ELOS (5-7 days)  Skilled Therapeutic Interventions: Skilled treatment session focused on cognitive goals. Upon arrival, patient was awake in bed. SLP facilitated session by providing overall supervision level verbal cues to self-monitor and correct errors while completing a BID pill box. Patient also required overall Min verbal cues for use of speech intelligibility strategies at the sentence level to maximize intelligibility to 100%. Patient left upright in bed with alarm on and all needs within reach. Continue with current plan of care.      Pain Pain Assessment Pain Scale: 0-10 Pain Score: 0-No pain  Therapy/Group: Individual Therapy  Tierre Gerard 10/15/2022, 3:08 PM

## 2022-10-15 NOTE — Progress Notes (Signed)
Physical Therapy Session Note  Patient Details  Name: Elizabeth Stone MRN: [id number] Date of Birth: 1946-01-27  Today's Date: 10/15/2022 PT Individual Time: 1015-1058 PT Individual Time Calculation (min): 43 min   Short Term Goals: Week 1:  PT Short Term Goal 1 (Week 1): = to LTGs based on ELOS  Skilled Therapeutic Interventions/Progress Updates:       Pt sleeping soundly in bed - awakens to touch. In agreement to therapy session and denies pain. Supine<>sitting EOB with supervision. She applies her cochlear processor without assist however later in session she was forgetful that she put it on - she was also forgetful to turn on the processor. Pt indicates she needs to use the restroom prior to leaving her room. She's quick to move and needs safety cues during session.   Sit<>stand to RW with CGA - pt using back of legs to help with standing. She ambulates with CGA and RW to the bathroom. Cues for safety awareness and safety approach to toilet while she steps backwards. She completes 3/3 toileting tasks without assist, continent of bladder void - charted.   Transported in w/c to main rehab gym for time.   Gait training ~23f with CGA and RW - cues for increasing stride lengthy and relaxing upper body - patient appears very tense in the shoulders and back - rigid. Gait distances limited by fatigue - she reports she "walks until she feels like her legs and back will go out." Educated on energy conservation to lower her falls risk.   BERG balance testing with results outlined below.   Patient demonstrates increased fall risk as noted by score of   21/56 on Berg Balance Scale.  (<36= high risk for falls, close to 100%; 37-45 significant >80%; 46-51 moderate >50%; 52-55 lower >25%)  Pt returned to her room and assisted to bed. Bed mobility completed without assist. Alarm on, call bell in reach, and all personal items provided.  Therapy Documentation Precautions:  Precautions Precautions:  Fall Precaution Comments: h/o falls, Menierre's Disease s/p cochlear implant Restrictions Weight Bearing Restrictions: No General:    Balance: Balance Balance Assessed: Yes Standardized Balance Assessment Standardized Balance Assessment: Berg Balance Test Berg Balance Test Sit to Stand: Able to stand without using hands and stabilize independently Standing Unsupported: Able to stand 2 minutes with supervision Sitting with Back Unsupported but Feet Supported on Floor or Stool: Able to sit safely and securely 2 minutes Stand to Sit: Sits safely with minimal use of hands Transfers: Able to transfer with verbal cueing and /or supervision Standing Unsupported with Eyes Closed: Able to stand 10 seconds with supervision Standing Ubsupported with Feet Together: Needs help to attain position and unable to hold for 15 seconds From Standing, Reach Forward with Outstretched Arm: Reaches forward but needs supervision From Standing Position, Pick up Object from Floor: Unable to try/needs assist to keep balance From Standing Position, Turn to Look Behind Over each Shoulder: Needs assist to keep from losing balance and falling Turn 360 Degrees: Needs assistance while turning Standing Unsupported, Alternately Place Feet on Step/Stool: Needs assistance to keep from falling or unable to try Standing Unsupported, One Foot in Front: Loses balance while stepping or standing Standing on One Leg: Unable to try or needs assist to prevent fall Total Score: 21   Therapy/Group: Individual Therapy  Caidence Kaseman P Johnta Couts 10/15/2022, 10:50 AM

## 2022-10-15 NOTE — Patient Care Conference (Signed)
Inpatient RehabilitationTeam Conference and Plan of Care Update Date: 10/14/22   Time: 10:12 AM    Patient Name: Elizabeth Stone      Medical Record Number: [id number]  Date of Birth: 1946-08-05 Sex: Female         Room/Bed: 4M11C/4M11C-01 Payor Info: Payor: [place] / Plan: BCBS MEDICARE / Product Type: *No Product type* /    Admit Date/Time:  10/11/2022  4:00 PM  Primary Diagnosis:  CVA (cerebral vascular accident) [facility])  Hospital Problems: Principal Problem:   CVA (cerebral vascular accident) [facility])    Expected Discharge Date: Expected Discharge Date: 10/21/22  Team Members Present: Physician leading conference: Dr. Alysia Penna Social Worker Present: Erlene Quan, BSW Nurse Present: Dorien Chihuahua, RN PT Present: Page Spiro, PT OT Present: Meriel Pica, OT SLP Present: Charolett Bumpers, SLP PPS Coordinator present : Gunnar Fusi, SLP     Current Status/Progress Goal Weekly Team Focus  Bowel/Bladder   pt continent of b/b   Remain continent   Assist with toileting qshift and prn    Swallow/Nutrition/ Hydration               ADL's   min A   mod I with basic ADLs; min A with I ADLS (meal prep, housekeeping)   balance, functional mobility, endurance, pt education    Mobility   CGA supine<>sit, CGA/min assist sit<>stand and stand pivot transfers using RW, min A gait up to 31f using RW - pt walks very guarded with posterior lean bias   mod-I transfers and supervision gait using LRAD  pt/family education, transfer training, gait training, DME training, D/C planning, dynamic standing balance, B LE strengthening    Communication   baseline speech impairments for the last year, 70-75% intelligibility   supervision A   communication repair startegies and then OPST recommendation    Safety/Cognition/ Behavioral Observations  mod-min A, further impacted by visual deficits   Supervision A   higher level attention, memory and  executive function    Pain   c/o chronic lower back pain 6/10, prn meds given   <3 pain score   Assess pain qshift and prn    Skin   skin intact   Remain intact  Assess pain qshift and prn      Discharge Planning:  Discharging home with assistance from son and DIL. Son work DIL will be aResearch officer, trade union   Team Discussion: Patient post CVA with poor endurance, posterior bias balance issues; visual deficits/double vision and tracking issues. Patient with chronic hearing issues reported speech issues for the past year PTA.  Patient on target to meet rehab goals: yes, currently needs min - CGA  for ADLs and to ambulate 44 with a RW. Needs supervision for communication and cognitive tasks; working on attention, executive function. Goals for discharge set for independent/mod I overall.  *See Care Plan and progress notes for long and short-term goals.   Revisions to Treatment Plan:  N/a  Teaching Needs: Safety, medications, transfers, toileting, etc.   Current Barriers to Discharge: Decreased caregiver support and Home enviroment access/layout; 8 steps in townhome.  Possible Resolutions to Barriers: Family education HH follow up services     Medical Summary Current Status: severe hearing impairment, limited family support, chronic pain issues  Barriers to Discharge: Medical stability   Possible Resolutions to [employer] CorporationFocus: HTN control, needs to work on steps   Continued Need for Acute Rehabilitation Level of Care: The patient requires daily medical management by a  physician with specialized training in physical medicine and rehabilitation for the following reasons: Direction of a multidisciplinary physical rehabilitation program to maximize functional independence : Yes Medical management of patient stability for increased activity during participation in an intensive rehabilitation regime.: Yes Analysis of laboratory values and/or radiology reports with any subsequent  need for medication adjustment and/or medical intervention. : Yes   I attest that I was present, lead the team conference, and concur with the assessment and plan of the team.   Dorien Chihuahua B 10/15/2022, 8:25 AM

## 2022-10-15 NOTE — Progress Notes (Signed)
Patient ID: Elizabeth Stone, female   DOB: 02-Oct-1946, 76 y.o.   MRN: [id number]  Patient [facility] referral sent [facility] for FU. Patient approved for PT/OT.

## 2022-10-15 NOTE — Progress Notes (Signed)
PROGRESS NOTE   Subjective/Complaints:  No issues overnite , + constipation , drinking coffee and prune juice   ROS: +fatigue, blurry vision   Objective:   No results found. Recent Labs    10/14/22 0602 10/15/22 0541  WBC 6.4 6.9  HGB 13.8 14.4  HCT 39.6 39.7  PLT 148* 156    Recent Labs    10/14/22 0602 10/15/22 0541  NA 127* 131*  K 3.6 3.7  CL 93* 96*  CO2 24 26  GLUCOSE 127* 119*  BUN 11 9  CREATININE 0.62 0.66  CALCIUM 9.5 9.7     Intake/Output Summary (Last 24 hours) at 10/15/2022 0744 Last data filed at 10/14/2022 1756 Gross per 24 hour  Intake 416 ml  Output --  Net 416 ml         Physical Exam: Vital Signs Blood pressure (!) 140/59, pulse 76, temperature 98 F (36.7 C), temperature source Oral, resp. rate 16, SpO2 98 %. Gen: no distress, normal appearing, BMI 31.99  General: No acute distress Mood and affect are appropriate Heart: Regular rate and rhythm no rubs murmurs or extra sounds Lungs: Clear to auscultation, breathing unlabored, no rales or wheezes Abdomen: Positive bowel sounds, soft nontender to palpation, nondistended Extremities: No clubbing, cyanosis, or edema Skin: No evidence of breakdown, no evidence of rash   Neuro: Alert and oriented x3  Was able to stay in lines when completing mae task Musculoskeletal: 5/5 strength throughout      Assessment/Plan: 1. Functional deficits which require 3+ hours per day of interdisciplinary therapy in a comprehensive inpatient rehab setting. Physiatrist is providing close team supervision and 24 hour management of active medical problems listed below. Physiatrist and rehab team continue to assess barriers to discharge/monitor patient progress toward functional and medical goals  Care Tool:  Bathing    Body parts bathed by patient: Right arm, Left arm, Chest, Abdomen, Front perineal area, Buttocks, Right upper leg, Left  upper leg, Right lower leg, Left lower leg, Face         Bathing assist Assist Level: Contact Guard/Touching assist     Upper Body Dressing/Undressing Upper body dressing   What is the patient wearing?: Bra, Pull over shirt    Upper body assist Assist Level: Minimal Assistance - Patient > 75%    Lower Body Dressing/Undressing Lower body dressing      What is the patient wearing?: Underwear/pull up, Pants     Lower body assist Assist for lower body dressing: Contact Guard/Touching assist     Toileting Toileting Toileting Activity did not occur (Clothing management and hygiene only): N/A (no void or bm)  Toileting assist Assist for toileting: Supervision/Verbal cueing     Transfers Chair/bed transfer  Transfers assist     Chair/bed transfer assist level: Contact Guard/Touching assist     Locomotion Ambulation   Ambulation assist      Assist level: Minimal Assistance - Patient > 75% Assistive device: Walker-rolling Max distance: 65   Walk 10 feet activity   Assist     Assist level: Minimal Assistance - Patient > 75% Assistive device: Walker-rolling   Walk 50 feet activity   Assist Walk 50  feet with 2 turns activity did not occur: Safety/medical concerns  Assist level: Minimal Assistance - Patient > 75% Assistive device: Walker-rolling    Walk 150 feet activity   Assist Walk 150 feet activity did not occur: Safety/medical concerns         Walk 10 feet on uneven surface  activity   Assist Walk 10 feet on uneven surfaces activity did not occur: Safety/medical concerns   Assist level: Minimal Assistance - Patient > 75% (ramp) Assistive device: Walker-rolling   Wheelchair     Assist Is the patient using a wheelchair?: Yes (pt with limited walking distance prior to hospitalization but did not require use of w/c for her functional mobility in the home - now using for transport to/from gym as this is further than pt walks at  baseline) Type of Wheelchair: Manual    Wheelchair assist level: Dependent - Patient 0%      Wheelchair 50 feet with 2 turns activity    Assist        Assist Level: Dependent - Patient 0%   Wheelchair 150 feet activity     Assist      Assist Level: Dependent - Patient 0%   Blood pressure (!) 140/59, pulse 76, temperature 98 F (36.7 C), temperature source Oral, resp. rate 16, SpO2 98 %.  Medical Problem List and Plan: 1. Functional deficits secondary to stroke like symptoms s/p TNK             -patient may shower             -ELOS/Goals: 5-7 days S             Continue CIR 2.  Antithrombotics: -DVT/anticoagulation:  Mechanical: Sequential compression devices, entire leg Bilateral lower extremities             -antiplatelet therapy: aspirin '81mg'$  daily 3. Neuropathy: continue cymbalta '60mg'$  BID.  4. Anxiety: decrease klonopin to '1mg'$  HS 5. Neuropsych/cognition: This patient is capable of making decisions on her own behalf. 6. HTN: continue cozaar '100mg'$  daily, HCTZ '25mg'$  daily, and Norvasc '5mg'$  dauly. Magnesium gluconate '250mg'$  ordered HS Vitals:   10/14/22 1909 10/15/22 0423  BP: 139/62 (!) 140/59  Pulse: 77 76  Resp: 16 16  Temp: 98.5 F (36.9 C) 98 F (36.7 C)  SpO2: 96% 98%    7. HLD: atorvastain increased to '20mg'$  as LDL is 77 and goal is <70 8. Hearing impaired: has cochlear implant in right  ear 9. 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 Chronic pain hx of multiple joint replacements (bilateral shoulders and knees) Fibromyalgia: on dilaudid, discuss current use of dilaudid 12. Fatigue: add vitamin B and B supplements 13. Suboptimal vitamin D: start ergocalciferol 50,000U once per week for 7 weeks 14. Blurry vision, discussed and has been stable 15. Hyponatremia: Na dropped back down to 127, no new symptoms, fluid restrict to 1829m and repeat tomorrow.  16. Low back pain: kpad ordered     LOS: 4 days A FACE TO F[place]ratmoorE Jamel Holzmann 10/15/2022, 7:44 AM

## 2022-10-16 DIAGNOSIS — I639 Cerebral infarction, unspecified: Secondary | ICD-10-CM | POA: Diagnosis not present

## 2022-10-16 LAB — BASIC METABOLIC PANEL
Anion gap: 9 (ref 5–15)
BUN: 11 mg/dL (ref 8–23)
CO2: 26 mmol/L (ref 22–32)
Calcium: 9.7 mg/dL (ref 8.9–10.3)
Chloride: 96 mmol/L — ABNORMAL LOW (ref 98–111)
Creatinine, Ser: 0.67 mg/dL (ref 0.44–1.00)
GFR, Estimated: >60 mL/min (ref >60)
Glucose, Bld: 119 mg/dL — ABNORMAL HIGH (ref 70–99)
Potassium: 3.7 mmol/L (ref 3.5–5.1)
Sodium: 131 mmol/L — ABNORMAL LOW (ref 135–145)

## 2022-10-16 LAB — CBC
HCT: 39.6 % (ref 36.0–46.0)
Hemoglobin: 13.7 g/dL (ref 12.0–15.0)
MCH: 33.5 pg (ref 26.0–34.0)
MCHC: 34.6 g/dL (ref 30.0–36.0)
MCV: 96.8 fL (ref 80.0–100.0)
Platelets: 150 10*3/uL (ref 150–400)
RBC: 4.09 MIL/uL (ref 3.87–5.11)
RDW: 13.1 % (ref 11.5–15.5)
WBC: 6.3 10*3/uL (ref 4.0–10.5)
nRBC: 0 % (ref 0.0–0.2)

## 2022-10-16 MED ORDER — ACETAMINOPHEN 650 MG RE SUPP: 650.0000 mg | RECTAL | Status: DC | PRN

## 2022-10-16 MED ORDER — ACETAMINOPHEN 500 MG PO TABS
500.0000 mg | ORAL_TABLET | Freq: Three times a day (TID) | ORAL | Status: DC
  Administered 2022-10-16 – 2022-10-21 (×19): 500 mg via ORAL
  Filled 2022-10-16 (×22): qty 1

## 2022-10-16 MED ORDER — ACETAMINOPHEN 160 MG/5ML PO SOLN: 650.0000 mg | ORAL | Status: DC | PRN

## 2022-10-16 MED ORDER — ACETAMINOPHEN 325 MG PO TABS
325.0000 mg | ORAL_TABLET | ORAL | Status: DC | PRN
  Administered 2022-10-20: 325 mg via ORAL

## 2022-10-16 NOTE — Progress Notes (Signed)
Occupational Therapy Session Note  Patient Details  Name: Elizabeth Stone MRN: [id number] Date of Birth: [birth year]  Today's Date: 10/16/2022 OT Individual Time: 0413-6438 OT Individual Time Calculation (min): 75 min    Short Term Goals: Week 1:   STGS = LTGs   Skilled Therapeutic Interventions/Progress Updates:    Pt received sleeping in bed but awoke easily. She was napping post her PT session.  Pt sat to EOB and stood up quickly but had a posterior lean and relied heavily on the RW for support. Demonstrated to patient how this puts her at a high fall risk and what movement patterns will make her safer. In the room and later in the gym, pt worked on numerous sit to stands to focus on her center of gravity, foot placement, forward lean and weight shift. Pt followed cues extremely well and was able to transition sit to stands without UE support with close S.    In between sets of 5-10 sit to stands at a time, intermixed UE and core strengthening exercises using yellow and then green therabands.  Pt did well working on rows, B arm pulls for upper back strength, resisted trunk rotation to 45 degrees with her focusing on using her core.  UE stretching for shoulders and upper back.  Pt does have ongoing back pain but able to tolerate exercises.  Also talked at length about recommendation not to drive until cleared by the MD. Discussed various recommendations for when she needs groceries, who to call to go for errands, etc.  Also recommended when pt is cleared to drive, to practice with her son in a quiet neighborhood or church parking lot.   Pt returned to her room and opted to return to bed to rest.  Bed alarm set and all needs met.   Therapy Documentation Precautions:  Precautions Precautions: Fall Precaution Comments: h/o falls, Menierre's Disease s/p cochlear implant Restrictions Weight Bearing Restrictions: No  Vital Signs: Therapy Vitals Temp: 97.8 F (36.6 C) Temp Source: Oral Pulse  Rate: 75 Resp: 18 BP: (Abnormal) 116/48 Patient Position (if appropriate): Lying Oxygen Therapy SpO2: 96 % O2 Device: Room Air Pain: Pain Assessment Pain Scale: 0-10 Pain Score: 7  Pain Type: Chronic pain Pain Location: Neck Pain Descriptors / Indicators: Aching Pain Onset: On-going Patients Stated Pain Goal: 2 Pain Intervention(s): RN made aware ADL: ADL Eating: Unable to assess Grooming: Contact guard Where Assessed-Grooming: Standing at sink Upper Body Bathing: Contact guard Where Assessed-Upper Body Bathing: Shower Lower Body Bathing: Contact guard Where Assessed-Lower Body Bathing: Shower Upper Body Dressing: Minimal assistance Where Assessed-Upper Body Dressing: Chair Lower Body Dressing: Minimal assistance Where Assessed-Lower Body Dressing: Chair Toileting: Unable to assess Toilet Transfer: Minimal assistance Toilet Transfer Method: Counselling psychologist: Grab bars Tub/Shower Transfer: Unable to assess Tub/Shower Transfer Method: Unable to assess Social research officer, government: Minimal assistance Social research officer, government Method: Heritage manager: Transfer tub bench ADL Comments: Patient has a shower seat at home but does not use it.  Patient's balance is biggest limiting factor for functional independence   Therapy/Group: Individual Therapy  [place] 10/16/2022, 8:29 AM

## 2022-10-16 NOTE — Progress Notes (Signed)
PROGRESS NOTE   Subjective/Complaints:  No issues overnite but did not sleep well due to low back pain, mainly takes ES Tylenol at home but occ takes hydocodone   ROS: +fatigue, blurry vision   Objective:   No results found. Recent Labs    10/15/22 0541 10/16/22 0543  WBC 6.9 6.3  HGB 14.4 13.7  HCT 39.7 39.6  PLT 156 150    Recent Labs    10/15/22 0541 10/16/22 0543  NA 131* 131*  K 3.7 3.7  CL 96* 96*  CO2 26 26  GLUCOSE 119* 119*  BUN 9 11  CREATININE 0.66 0.67  CALCIUM 9.7 9.7     Intake/Output Summary (Last 24 hours) at 10/16/2022 0757 Last data filed at 10/15/2022 1804 Gross per 24 hour  Intake 480 ml  Output --  Net 480 ml         Physical Exam: Vital Signs Blood pressure (!) 116/48, pulse 75, temperature 97.8 F (36.6 C), temperature source Oral, resp. rate 18, SpO2 96 %. Gen: no distress, normal appearing, BMI 31.99  General: No acute distress Mood and affect are appropriate Heart: Regular rate and rhythm no rubs murmurs or extra sounds Lungs: Clear to auscultation, breathing unlabored, no rales or wheezes Abdomen: Positive bowel sounds, soft nontender to palpation, nondistended Extremities: No clubbing, cyanosis, or edema Skin: No evidence of breakdown, no evidence of rash   Neuro: Alert and oriented x3  Was able to stay in lines when completing mae task Musculoskeletal: 5/5 strength throughout      Assessment/Plan: 1. Functional deficits which require 3+ hours per day of interdisciplinary therapy in a comprehensive inpatient rehab setting. Physiatrist is providing close team supervision and 24 hour management of active medical problems listed below. Physiatrist and rehab team continue to assess barriers to discharge/monitor patient progress toward functional and medical goals  Care Tool:  Bathing    Body parts bathed by patient: Right arm, Left arm, Chest, Abdomen, Front  perineal area, Buttocks, Right upper leg, Left upper leg, Right lower leg, Left lower leg, Face         Bathing assist Assist Level: Contact Guard/Touching assist     Upper Body Dressing/Undressing Upper body dressing   What is the patient wearing?: Bra, Pull over shirt    Upper body assist Assist Level: Minimal Assistance - Patient > 75%    Lower Body Dressing/Undressing Lower body dressing      What is the patient wearing?: Underwear/pull up, Pants     Lower body assist Assist for lower body dressing: Contact Guard/Touching assist     Toileting Toileting Toileting Activity did not occur (Clothing management and hygiene only): N/A (no void or bm)  Toileting assist Assist for toileting: Supervision/Verbal cueing     Transfers Chair/bed transfer  Transfers assist     Chair/bed transfer assist level: Contact Guard/Touching assist     Locomotion Ambulation   Ambulation assist      Assist level: Minimal Assistance - Patient > 75% Assistive device: Walker-rolling Max distance: 65   Walk 10 feet activity   Assist     Assist level: Minimal Assistance - Patient > 75% Assistive device: Walker-rolling  Walk 50 feet activity   Assist Walk 50 feet with 2 turns activity did not occur: Safety/medical concerns  Assist level: Minimal Assistance - Patient > 75% Assistive device: Walker-rolling    Walk 150 feet activity   Assist Walk 150 feet activity did not occur: Safety/medical concerns         Walk 10 feet on uneven surface  activity   Assist Walk 10 feet on uneven surfaces activity did not occur: Safety/medical concerns   Assist level: Minimal Assistance - Patient > 75% (ramp) Assistive device: Walker-rolling   Wheelchair     Assist Is the patient using a wheelchair?: Yes (pt with limited walking distance prior to hospitalization but did not require use of w/c for her functional mobility in the home - now using for transport to/from gym  as this is further than pt walks at baseline) Type of Wheelchair: Manual    Wheelchair assist level: Dependent - Patient 0%      Wheelchair 50 feet with 2 turns activity    Assist        Assist Level: Dependent - Patient 0%   Wheelchair 150 feet activity     Assist      Assist Level: Dependent - Patient 0%   Blood pressure (!) 116/48, pulse 75, temperature 97.8 F (36.6 C), temperature source Oral, resp. rate 18, SpO2 96 %.  Medical Problem List and Plan: 1. Functional deficits secondary to stroke like symptoms s/p TNK             -patient may shower             -ELOS/Goals: 5-7 days S             Continue CIR 2.  Antithrombotics: -DVT/anticoagulation:  Mechanical: Sequential compression devices, entire leg Bilateral lower extremities             -antiplatelet therapy: aspirin '81mg'$  daily 3. Neuropathy: continue cymbalta '60mg'$  BID.  4. Anxiety: decrease klonopin to '1mg'$  HS 5. Neuropsych/cognition: This patient is capable of making decisions on her own behalf. 6. HTN: continue cozaar '100mg'$  daily, HCTZ '25mg'$  daily, and Norvasc '5mg'$  dauly. Magnesium gluconate '250mg'$  ordered HS Vitals:   10/15/22 1910 10/16/22 0507  BP: (!) 135/52 (!) 116/48  Pulse: 81 75  Resp: 18 18  Temp: 98.4 F (36.9 C) 97.8 F (36.6 C)  SpO2: 96% 96%    7. HLD: atorvastain increased to '20mg'$  as LDL is 77 and goal is <70 8. Hearing impaired: has cochlear implant in right  ear 9. 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 Chronic pain hx of multiple joint replacements (bilateral shoulders and knees) Fibromyalgia:will schedule tylenol , also on dilaudid, which she is taking only ~1 x per day  12. Fatigue: add vitamin B and B supplements 13. Suboptimal vitamin D: start ergocalciferol 50,000U once per week for 7 weeks 14. Blurry vision, discussed and has been stable 15. Hyponatremia: stable at 131    Latest Ref Rng & Units 10/16/2022     5:43 AM 10/15/2022    5:41 AM 10/14/2022    6:02 AM  BMP  Glucose 70 - 99 mg/dL 119  119  127   BUN 8 - 23 mg/dL '11  9  11   '$ Creatinine 0.44 - 1.00 mg/dL 0.67  0.66  0.62   Sodium 135 - 145 mmol/L 131  131  127   Potassium 3.5 - 5.1 mmol/L 3.7  3.7  3.6  Chloride 98 - 111 mmol/L 96  96  93   CO2 22 - 32 mmol/L '26  26  24   '$ Calcium 8.9 - 10.3 mg/dL 9.7  9.7  9.5     16. Low back pain: kpad ordered, will schedule tylenol    LOS: 5 days A FACE TO FACE EVALUATION WAS PERFORMED  Charlett Blake 10/16/2022, 7:57 AM

## 2022-10-16 NOTE — Progress Notes (Signed)
Patient ID: Elizabeth Stone, female   DOB: 1946-08-10, 76 y.o.   MRN: [id number]  NO DME required per pt and therapy. [place] set with [facility]

## 2022-10-16 NOTE — Progress Notes (Signed)
Occupational Therapy Session Note  Patient Details  Name: Elizabeth Stone MRN: [id number] Date of Birth: 13-Jun-1946  Today's Date: 10/16/2022 OT Individual Time: 1351-1447 OT Individual Time Calculation (min): 56 min    Short Term Goals: Week 1:  OT Short Term Goal 1 (Week 1): STGS = LTGs  Skilled Therapeutic Interventions/Progress Updates:  Pt greeted supine in bed, pt agreeable to OT intervention. Session focus on BADL reeducation, IADLS functional mobility, dynamic standing balance and decreasing overall caregiver burden.                     Pt completed supine>sit with supervision, ambulatory transfer to toilet with Rw and supervision. Pt completed 3/3 toileting tasks with supervision with + urine void. Pt transported to ADL in w/c with total A for time mgmt.   Pt completed stand pivot transfer into walkin shower using grab bar on R side with CGA. Pt reports she has a TTb at home but doesn't use it because its too big, recommended pt buy a smaller shower seat d/t balance impairments and for energy conservation. Pt reports she would think about it. Pt reports she doesn't shower unless she feels like she has enough energy and showers every other day.   Pt completed IADL task in apt where pt collected towels placed around room with RW. Pt completed task with CGA with no LOB, towels placed at hip height with recommendation to use reacher if reaching items from floor level. Pt able to stand at table to fold towels with supervision with no UE support or LOB. Pt reports she's used to standing up to iron but often tasks breaks as needed.   Remainder of session focused on higher level balance tasks for improved ADL participation and to decrease risk of falls.   - pt able to stand with no UE support to complete standign chest preses with weighted ball with CGA with no LOB - graded task up and had pt hold ball at chest height and complete standing marches, pt needed up to MIN A for task d/t LOB  posteriorly - pt stood on compliant surface with unilateral support to reach across midline to place clothespin on net of basketball hoop to challenge dynamic standing balance, pt completed task with CGA with no LOB  Pt completed ~ 100 ft of functional mobility back to room with Rw and CGA, no LOB.   Ended session with pt supine in bed with all needs within reach and bed alarm activated.                    Therapy Documentation Precautions:  Precautions Precautions: Fall Precaution Comments: h/o falls, Menierre's Disease s/p cochlear implant Restrictions Weight Bearing Restrictions: No  Pain: unrated pain reported at back, ice applied for pain mgmt.     Therapy/Group: Individual Therapy  Felipa Laroche 10/16/2022, 3:24 PM

## 2022-10-16 NOTE — Progress Notes (Shared)
Inpatient Rehabilitation Care Coordinator Discharge Note   Patient Details  Name: Elizabeth Stone MRN: [id number] Date of Birth: April 30, 1946   Discharge location: Home  Length of Stay: 8 Days  Discharge activity level: Sup  Home/community participation: Son and DIL  Patient response WU:JWJXBJ Literacy - How often do you need to have someone help you when you read instructions, pamphlets, or other written material from your doctor or pharmacy?: Sometimes  Patient response YN:WGNFAO Isolation - How often do you feel lonely or isolated from those around you?: Never  Services provided included: MD, RD, PT, OT, SLP, RN, CM, TR, Pharmacy, Neuropsych, SW  Financial Services:  Charity fundraiser Utilized: Multimedia programmer [employer]  Choices offered to/list presented to: pt  Follow-up services arranged:  [place]: [place]         Patient response to transportation need: Is the patient able to respond to transportation needs?: Yes In the past 12 months, has lack of transportation kept you from medical appointments or from getting medications?: No In the past 12 months, has lack of transportation kept you from meetings, work, or from getting things needed for daily living?: No    Comments (or additional information):  Patient/Family verbalized understanding of follow-up arrangements:  Yes  Individual responsible for coordination of the follow-up plan: son, [name]  Confirmed correct DME delivered: Dyanne Iha 10/16/2022    Dyanne Iha

## 2022-10-16 NOTE — Progress Notes (Signed)
Physical Therapy Session Note  Patient Details  Name: Elizabeth Stone MRN: [id number] Date of Birth: 03/20/1946  Today's Date: 10/16/2022 PT Individual Time: 0822-0905 PT Individual Time Calculation (min): 43 min   Short Term Goals: Week 1:  PT Short Term Goal 1 (Week 1): = to LTGs based on ELOS   Skilled Therapeutic Interventions/Progress Updates:   Pt received sitting EOB, with RN present and pt requesting to finish breakfast. PT returned in 20 min. Pt then agreeable to PT. Min assist for management of lids on oatmeal and drink while sitting EOB.   Dressing at EOB with supervision assist-CGA to pull pants to waits in standing and to attempt to don bra. 1 near LOB while attempting to don Bra in standing. Pt then allowed PT to clasp to prevent fall.   Toileting with RW and distant supervision assist cues for use of rail in bathroom for safety.   Gait training in rehab gym 45ft x 2. Supervision assist with RW and cues for decreased speed of turns.   Dynamic balance training to complete moderate difficulty peg board puzzle while standing on red wedge. CGA for balance with cues for BLE position and improved WB through the forefoot.   Pt returned to room and performed stand pivot transfer to bed with RW and supervision assist for safety. Sit>supine completed without assist, and left supine in bed with call bell in reach and all needs met.       Therapy Documentation Precautions:  Precautions Precautions: Fall Precaution Comments: h/o falls, Menierre's Disease s/p cochlear implant Restrictions Weight Bearing Restrictions: No   Pain: Pain Assessment Pain Scale: 0-10 Pain Score: 7  Pain Type: Chronic pain Pain Location: Neck Pain Descriptors / Indicators: Aching Pain Onset: On-going Patients Stated Pain Goal: 2 Pain Intervention(s): RN made aware    Therapy/Group: Individual Therapy   E  10/16/2022, 9:50 AM  

## 2022-10-17 DIAGNOSIS — R2990 Unspecified symptoms and signs involving the nervous system: Secondary | ICD-10-CM | POA: Diagnosis present

## 2022-10-17 LAB — GLUCOSE, CAPILLARY
Glucose-Capillary: 106 mg/dL — ABNORMAL HIGH (ref 70–99)
Glucose-Capillary: 61 mg/dL — ABNORMAL LOW (ref 70–99)
Glucose-Capillary: 143 mg/dL — ABNORMAL HIGH (ref 70–99)

## 2022-10-17 NOTE — Progress Notes (Signed)
Physical Therapy Session Note  Patient Details  Name: Elizabeth Stone MRN: [id number] Date of Birth: 1946-06-07  Today's Date: 10/17/2022 PT Individual Time: 5784-6962 PT Individual Time Calculation (min): 58 min   Short Term Goals: Week 1:  PT Short Term Goal 1 (Week 1): = to LTGs based on ELOS  Skilled Therapeutic Interventions/Progress Updates:  Patient supine in bed on entrance to room. Patient alert and agreeable to PT session.   Patient with minimal pain complaint at start of session at lower back.  Therapeutic Activity: Bed Mobility: Pt performed supine <> sit with supervision. No cueing required. Transfers: Pt performed sit<>stand and stand pivot transfers throughout session with close supervision. Provided verbal cues for reaching full upright posture.  Gait Training:  Pt ambulated 100' x1/ 120' x1/ 100' x1 using RW with close supervision. Demonstrated slow but consistent pace. Provided vc/ tc for upright posture and level gaze.  Pt guided in stair training and is able to complete eight 6" steps using RHR with BUE and step-to gait pattern on ascent and BHR on descent with CGA/ supervision with reciprocating gait pattern. Pt also able to ascend/ descend an 8" curb step using HR to L side with BUE support and CGA/ close supervision.   Next pt guided in 5" curb step using RW. She is able to demo good technique with AD and minimal cueing. Performs x4 with close supervision/ CGA.   20' x2 ambulation with no AD and CGA with increased lateral sway.  Stand pivot using bedrail with supervision/ CGA. Return to supine with supervision. Patient supine in bed at end of session with brakes locked, bed alarm set, and all needs within reach.  Therapy Documentation Precautions:  Precautions Precautions: Fall Precaution Comments: h/o falls, Menierre's Disease s/p cochlear implant Restrictions Weight Bearing Restrictions: No General:   Vital Signs: Therapy Vitals Temp: 98.2 F (36.8  C) Pulse Rate: 79 Resp: 16 BP: (!) 151/64 Patient Position (if appropriate): Lying Oxygen Therapy O2 Device: Room Air Pain: No pain complaint during session.   Therapy/Group: Individual Therapy  Loel Dubonnet PT, DPT, CSRS 10/17/2022, 6:30 PM

## 2022-10-17 NOTE — Progress Notes (Signed)
Physical Therapy Session Note  Patient Details  Name: Elizabeth Stone MRN: [id number] Date of Birth: 12/07/1945  Today's Date: 10/17/2022 PT Individual Time: 1652-1731 PT Individual Time Calculation (min): 39 min   Short Term Goals: Week 1:  PT Short Term Goal 1 (Week 1): = to LTGs based on ELOS   Skilled Therapeutic Interventions/Progress Updates:   Pt received supine in bed and agreeable to PT. Supine>sit transfer with supervision assist and cues for safety and linen management for safety.   Gait training with RW x 100ft and 80ft with supervision  assist and cues for safety in turns and decreased speed to reduce fall risk.   Nustep x 8 min with cues for full ROM. Stand pivot transfers to nustep with RW and supervision assist.   BITS visual scanning x 2 min and visual pursuits 21 targets x 2 speed 1 and 2 with cues for sequencing of red/white/blue. Supervision assist for balance and safety with no UE support throughout use of BITS.   Pt returned to room and performed stand pivot transfer with RW and cues for AD management.  Sit>supine completed without assist, and left supine in bed with call bell in reach and all needs met.       Therapy Documentation Precautions:  Precautions Precautions: Fall Precaution Comments: h/o falls, Menierre's Disease s/p cochlear implant Restrictions Weight Bearing Restrictions: No  Vital Signs: Therapy Vitals Temp: 98.7 F (37.1 C) Pulse Rate: 84 Resp: 17 BP: (!) 153/64 Patient Position (if appropriate): Sitting Oxygen Therapy SpO2: 95 % O2 Device: Room Air Pain:  denies   Therapy/Group: Individual Therapy   E  10/17/2022, 6:16 PM  

## 2022-10-17 NOTE — Progress Notes (Signed)
Occupational Therapy Session Note  Patient Details  Name: Elizabeth Stone MRN: [id number] Date of Birth: October 13, 1946  Today's Date: 10/17/2022 OT Individual Time: 1415-1505 OT Individual Time Calculation (min): 50 min    Short Term Goals: No short term goals set  Skilled Therapeutic Interventions/Progress Updates:    Pt received in bed and asked pt if she would like to shower. Pt prefers to wait to this evening as she has PT after this OT session.  She talked with a tech that offered to help her.  Pt stated " I want to use my therapy time working out". Pt taken to therapy gym.  She continues to need cues to push up to stand vs grabbing for the RW.  Pt worked on ambulating in gym 150 ft but did get very fatigued towards the end just before getting to the mat.  "I felt like my knees were going to give out".  Pt sat to rest but stated she had double vision. Eye alignment and tracking normal. Cued pt to breathe and relax and then her diplopia resolved.  Pt worked on UE ex with theraband, LE AROM with towel slides, and sit to stands using a 3# bar to focus on LE strength and balance. Pt transferred back to wc.  Returned to room and transferred to bed to rest. Alarm set and all needs met.   Therapy Documentation Precautions:  Precautions Precautions: Fall Precaution Comments: h/o falls, Menierre's Disease s/p cochlear implant Restrictions Weight Bearing Restrictions: No  Pain: pt with some c/o back pain         Therapy/Group: Individual Therapy  [place] 10/17/2022, 8:02 AM

## 2022-10-17 NOTE — Progress Notes (Signed)
Speech Language Pathology Daily Session Note  Patient Details  Name: BAHAR SHELDEN MRN: [id number] Date of Birth: Oct 23, 1945  Today's Date: 10/17/2022 SLP Individual Time: 0917-1000 SLP Individual Time Calculation (min): 43 min  Short Term Goals: Week 1: SLP Short Term Goal 1 (Week 1): STG=LTG due to short ELOS (5-7 days)  Skilled Therapeutic Interventions: Pt seen for skilled ST with focus on cognitive communication goals, pt in bed and agreeable to therapeutic tasks. SLP facilitated moderately complex problem ID/solution generating task with patient benefiting from overall min A cues for problem ID and Supervision A cues for solution generating. Pt demonstrates some decreased awareness into deficits s/p CVA, attributing attention changes strictly to medication and perseverating on driving/going back to working out 3x/week at the gym. Discussed patient's high risk of falls and strategies to ID potential hazards and reduce fall risk which patient was able to carryover throughout session. Pt will continue to benefit from current ST POC.   Pain Pain Assessment Pain Scale: 0-10 Pain Score: 0-No pain  Therapy/Group: Individual Therapy  Dewaine Conger 10/17/2022, 9:55 AM

## 2022-10-17 NOTE — Progress Notes (Signed)
PROGRESS NOTE   Subjective/Complaints:  Pt reports back is bothering her this AM- woke from deep sleep- Put her cochlear implant in place- no issues other than back pain which is chronic   Asking about therapy in [place]   ROS:  Pt denies SOB, abd pain, CP, N/V/C/D, and vision changes    Objective:   No results found. Recent Labs    10/15/22 0541 10/16/22 0543  WBC 6.9 6.3  HGB 14.4 13.7  HCT 39.7 39.6  PLT 156 150   Recent Labs    10/15/22 0541 10/16/22 0543  NA 131* 131*  K 3.7 3.7  CL 96* 96*  CO2 26 26  GLUCOSE 119* 119*  BUN 9 11  CREATININE 0.66 0.67  CALCIUM 9.7 9.7    Intake/Output Summary (Last 24 hours) at 10/17/2022 1303 Last data filed at 10/17/2022 0900 Gross per 24 hour  Intake 420 ml  Output --  Net 420 ml        Physical Exam: Vital Signs Blood pressure (!) 125/52, pulse 72, temperature 97.7 F (36.5 C), temperature source Oral, resp. rate 16, SpO2 97 %. Gen: no distress, normal appearing, BMI 31.99   General: awake, alert, appropriate, initially asleep and snoring deeply;  NAD HENT: conjugate gaze; oropharynx dry CV: regular rate; no JVD Pulmonary: CTA B/L; no W/R/R- good air movement after woke up GI: soft, NT, ND, (+)BS Psychiatric: appropriate Neurological: Very HOH- a cochlear implant on R side- put on cochlear aid  Neuro: Alert and oriented x3  Was able to stay in lines when completing mae task Musculoskeletal: 5/5 strength throughout      Assessment/Plan: 1. Functional deficits which require 3+ hours per day of interdisciplinary therapy in a comprehensive inpatient rehab setting. Physiatrist is providing close team supervision and 24 hour management of active medical problems listed below. Physiatrist and rehab team continue to assess barriers to discharge/monitor patient progress toward functional and medical goals  Care Tool:  Bathing    Body  parts bathed by patient: Right arm, Left arm, Chest, Abdomen, Front perineal area, Buttocks, Right upper leg, Left upper leg, Right lower leg, Left lower leg, Face         Bathing assist Assist Level: Contact Guard/Touching assist     Upper Body Dressing/Undressing Upper body dressing   What is the patient wearing?: Bra, Pull over shirt    Upper body assist Assist Level: Minimal Assistance - Patient > 75%    Lower Body Dressing/Undressing Lower body dressing      What is the patient wearing?: Underwear/pull up, Pants     Lower body assist Assist for lower body dressing: Contact Guard/Touching assist     Toileting Toileting Toileting Activity did not occur (Clothing management and hygiene only): N/A (no void or bm)  Toileting assist Assist for toileting: Supervision/Verbal cueing     Transfers Chair/bed transfer  Transfers assist     Chair/bed transfer assist level: Contact Guard/Touching assist     Locomotion Ambulation   Ambulation assist      Assist level: Minimal Assistance - Patient > 75% Assistive device: Walker-rolling Max distance: 65   Walk 10 feet activity  Assist     Assist level: Minimal Assistance - Patient > 75% Assistive device: Walker-rolling   Walk 50 feet activity   Assist Walk 50 feet with 2 turns activity did not occur: Safety/medical concerns  Assist level: Minimal Assistance - Patient > 75% Assistive device: Walker-rolling    Walk 150 feet activity   Assist Walk 150 feet activity did not occur: Safety/medical concerns         Walk 10 feet on uneven surface  activity   Assist Walk 10 feet on uneven surfaces activity did not occur: Safety/medical concerns   Assist level: Minimal Assistance - Patient > 75% (ramp) Assistive device: Walker-rolling   Wheelchair     Assist Is the patient using a wheelchair?: Yes (pt with limited walking distance prior to hospitalization but did not require use of w/c for her  functional mobility in the home - now using for transport to/from gym as this is further than pt walks at baseline) Type of Wheelchair: Manual    Wheelchair assist level: Dependent - Patient 0%      Wheelchair 50 feet with 2 turns activity    Assist        Assist Level: Dependent - Patient 0%   Wheelchair 150 feet activity     Assist      Assist Level: Dependent - Patient 0%   Blood pressure (!) 125/52, pulse 72, temperature 97.7 F (36.5 C), temperature source Oral, resp. rate 16, SpO2 97 %.  Medical Problem List and Plan: 1. Functional deficits secondary to stroke like symptoms s/p TNK             -patient may shower             -ELOS/Goals: 5-7 days S             Continue CIR  Pt asking about therapy in [place] after d/c.  2.  Antithrombotics: -DVT/anticoagulation:  Mechanical: Sequential compression devices, entire leg Bilateral lower extremities             -antiplatelet therapy: aspirin '81mg'$  daily 3. Neuropathy: continue cymbalta '60mg'$  BID.   12/30- Back pain- on scheduled tylenol- sometimes took Norco at home per chart- con't regimen- might benefit from Lidoderm patch.  4. Anxiety: decrease klonopin to '1mg'$  HS 5. Neuropsych/cognition: This patient is capable of making decisions on her own behalf. 6. HTN: continue cozaar '100mg'$  daily, HCTZ '25mg'$  daily, and Norvasc '5mg'$  dauly. Magnesium gluconate '250mg'$  ordered HS  12/30- BP very slightly elevated at 509 systlic- con't regimen Vitals:   10/16/22 1922 10/17/22 0309  BP: (!) 140/68 (!) 125/52  Pulse: 81 72  Resp: 18 16  Temp: 98.1 F (36.7 C) 97.7 F (36.5 C)  SpO2: 96% 97%    7. HLD: atorvastain increased to '20mg'$  as LDL is 77 and goal is <70 8. Hearing impaired: has cochlear implant in right  ear 9. 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 Chronic pain hx of multiple joint replacements (bilateral shoulders and knees) Fibromyalgia:will  schedule tylenol , also on dilaudid, which she is taking only ~1 x per day   12/30- might try Lidoderm patches for back pain as well? 12. Fatigue: add vitamin B and B supplements 13. Suboptimal vitamin D: start ergocalciferol 50,000U once per week for 7 weeks 14. Blurry vision, discussed and has been stable 15. Hyponatremia: stable at 131    Latest Ref Rng & Units 10/16/2022    5:43  AM 10/15/2022    5:41 AM 10/14/2022    6:02 AM  BMP  Glucose 70 - 99 mg/dL 119  119  127   BUN 8 - 23 mg/dL '11  9  11   '$ Creatinine 0.44 - 1.00 mg/dL 0.67  0.66  0.62   Sodium 135 - 145 mmol/L 131  131  127   Potassium 3.5 - 5.1 mmol/L 3.7  3.7  3.6   Chloride 98 - 111 mmol/L 96  96  93   CO2 22 - 32 mmol/L '26  26  24   '$ Calcium 8.9 - 10.3 mg/dL 9.7  9.7  9.5     16. Low back pain: kpad ordered, will schedule tylenol    LOS: 6 days A FACE TO FACE EVALUATION WAS PERFORMED  Feven Alderfer 10/17/2022, 1:03 PM

## 2022-10-18 LAB — GLUCOSE, CAPILLARY
Glucose-Capillary: 129 mg/dL — ABNORMAL HIGH (ref 70–99)
Glucose-Capillary: 157 mg/dL — ABNORMAL HIGH (ref 70–99)
Glucose-Capillary: 114 mg/dL — ABNORMAL HIGH (ref 70–99)
Glucose-Capillary: 186 mg/dL — ABNORMAL HIGH (ref 70–99)

## 2022-10-18 NOTE — Discharge Summary (Signed)
Physician Discharge Summary  Patient ID: Elizabeth Stone MRN: [id number] DOB/AGE: [birth year] 76 y.o.  Admit date: 10/11/2022 Discharge date: 10/21/2022  Discharge Diagnoses:  Principal Problem:   Stroke-like symptom Active Problems:   Cerebellar stroke (HCC) Hypertension Hyperlipidemia Hearing impaired Obesity Mnire's disease Hyponatremia Mood stabilization Prediabetes  Discharged Condition: Stable  Significant Diagnostic Studies: CT HEAD WO CONTRAST (5MM)  Result Date: 10/09/2022 CLINICAL DATA:  24 hours post TNK EXAM: CT HEAD WITHOUT CONTRAST TECHNIQUE: Contiguous axial images were obtained from the base of the skull through the vertex without intravenous contrast. RADIATION DOSE REDUCTION: This exam was performed according to the departmental dose-optimization program which includes automated exposure control, adjustment of the mA and/or kV according to patient size and/or use of iterative reconstruction technique. COMPARISON:  Yesterday FINDINGS: Brain: No evidence of acute infarction, hemorrhage, hydrocephalus, extra-axial collection or mass lesion/mass effect. Chronic small vessel ischemic type change in the cerebral white matter. Streak artifact which is unavoidable from cochlear implant Vascular: No hyperdense vessel or unexpected calcification. Skull: Normal. Negative for fracture or focal lesion. Sinuses/Orbits: Cochlear implant on the right. IMPRESSION: Stable head CT.  No hemorrhage or visible gray matter infarct. Electronically Signed   By: Jorje Guild M.D.   On: 10/09/2022 04:27   EEG adult  Result Date: 10/08/2022 Lora Havens, MD     10/08/2022  4:43 PM Patient Name: Elizabeth Stone MRN: [id number] Epilepsy Attending: Lora Havens Referring Physician/Provider: Donnetta Simpers, MD Date: 10/08/2022 Duration: 24.26 mins Patient history: 76yo F presents with acute onset diplopia, blurred vision, vertigo and headaches. EEg to evaluate for seizure Level of  alertness: Awake AEDs during EEG study: LTG, Clonazepam Technical aspects: This EEG study was done with scalp electrodes positioned according to the 10-20 International system of electrode placement. Electrical activity was reviewed with band pass filter of 1-'70Hz'$ , sensitivity of 7 uV/mm, display speed of 19m/sec with a '60Hz'$  notched filter applied as appropriate. EEG data were recorded continuously and digitally stored.  Video monitoring was available and reviewed as appropriate. Description: The posterior dominant rhythm consists of 8-9 Hz activity of moderate voltage (25-35 uV) seen predominantly in posterior head regions, symmetric and reactive to eye opening and eye closing. Hyperventilation and photic stimulation were not performed.   IMPRESSION: This study is within normal limits. No seizures or epileptiform discharges were seen throughout the recording. A normal interictal EEG does not exclude the diagnosis of epilepsy. PLora Havens  ECHOCARDIOGRAM COMPLETE  Result Date: 10/08/2022    ECHOCARDIOGRAM REPORT   Patient Name:   Elizabeth LIASDate of Exam: 10/08/2022 Medical Rec #:  [id number]   Height:       61.0 in Accession #:    [id number]  Weight:       169.3 lb Date of Birth:  [birth year]   BSA:          1.760 m Patient Age:    76years     BP:           138/61 mmHg Patient Gender: F            HR:           87 bpm. Exam Location:  Inpatient Procedure: 2D Echo, Cardiac Doppler and Color Doppler Indications:    Stroke  History:        Patient has prior history of Echocardiogram examinations, most  recent 11/18/2018. Stroke, Signs/Symptoms:Dyspnea; Risk                 Factors:Hypertension and Dyslipidemia. Substance abuse.  Sonographer:    Roseanna Rainbow RDCS Referring Phys: [id number] Crawford Givens [facility]  Sonographer Comments: Technically difficult study due to poor echo windows and patient is obese. Image acquisition challenging due to patient body habitus. IMPRESSIONS  1. Left ventricular  ejection fraction, by estimation, is 65 to 70%. The left ventricle has normal function. The left ventricle has no regional wall motion abnormalities. Left ventricular diastolic parameters are consistent with Grade I diastolic dysfunction (impaired relaxation).  2. Right ventricular systolic function is normal. The right ventricular size is normal. Tricuspid regurgitation signal is inadequate for assessing PA pressure.  3. The mitral valve is normal in structure. No evidence of mitral valve regurgitation. No evidence of mitral stenosis.  4. The aortic valve is tricuspid. Aortic valve regurgitation is not visualized. No aortic stenosis is present.  5. The inferior vena cava is normal in size with greater than 50% respiratory variability, suggesting right atrial pressure of 3 mmHg. FINDINGS  Left Ventricle: Left ventricular ejection fraction, by estimation, is 65 to 70%. The left ventricle has normal function. The left ventricle has no regional wall motion abnormalities. The left ventricular internal cavity size was normal in size. There is  no left ventricular hypertrophy. Left ventricular diastolic parameters are consistent with Grade I diastolic dysfunction (impaired relaxation). Right Ventricle: The right ventricular size is normal. No increase in right ventricular wall thickness. Right ventricular systolic function is normal. Tricuspid regurgitation signal is inadequate for assessing PA pressure. Left Atrium: Left atrial size was normal in size. Right Atrium: Right atrial size was normal in size. Pericardium: There is no evidence of pericardial effusion. Mitral Valve: The mitral valve is normal in structure. No evidence of mitral valve regurgitation. No evidence of mitral valve stenosis. Tricuspid Valve: The tricuspid valve is normal in structure. Tricuspid valve regurgitation is not demonstrated. Aortic Valve: The aortic valve is tricuspid. Aortic valve regurgitation is not visualized. No aortic stenosis is  present. Pulmonic Valve: The pulmonic valve was normal in structure. Pulmonic valve regurgitation is not visualized. Aorta: The aortic root is normal in size and structure. Venous: The inferior vena cava is normal in size with greater than 50% respiratory variability, suggesting right atrial pressure of 3 mmHg. IAS/Shunts: No atrial level shunt detected by color flow Doppler.  LEFT VENTRICLE PLAX 2D LVIDd:         3.80 cm     Diastology LVIDs:         2.20 cm     LV e' medial:    7.29 cm/s LV PW:         1.20 cm     LV E/e' medial:  15.6 LV IVS:        0.70 cm     LV e' lateral:   10.60 cm/s LVOT diam:     1.90 cm     LV E/e' lateral: 10.8 LV SV:         58 LV SV Index:   33 LVOT Area:     2.84 cm  LV Volumes (MOD) LV vol d, MOD A2C: 68.6 ml LV vol d, MOD A4C: 36.5 ml LV vol s, MOD A2C: 15.2 ml LV vol s, MOD A4C: 8.1 ml LV SV MOD A2C:     53.4 ml LV SV MOD A4C:     36.5 ml LV SV MOD BP:  42.3 ml RIGHT VENTRICLE             IVC RV S prime:     17.60 cm/s  IVC diam: 1.70 cm TAPSE (M-mode): 2.0 cm LEFT ATRIUM             Index        RIGHT ATRIUM          Index LA diam:        3.00 cm 1.70 cm/m   RA Area:     8.14 cm LA Vol (A2C):   8.3 ml  4.72 ml/m   RA Volume:   15.10 ml 8.58 ml/m LA Vol (A4C):   24.3 ml 13.81 ml/m LA Biplane Vol: 15.0 ml 8.52 ml/m  AORTIC VALVE LVOT Vmax:   125.00 cm/s LVOT Vmean:  78.800 cm/s LVOT VTI:    0.205 m  AORTA Ao Root diam: 2.80 cm Ao Asc diam:  2.80 cm MITRAL VALVE MV Area (PHT): 3.65 cm     SHUNTS MV Decel Time: 208 msec     Systemic VTI:  0.20 m MV E velocity: 114.00 cm/s  Systemic Diam: 1.90 cm MV A velocity: 126.00 cm/s MV E/A ratio:  0.90 Dalton McleanMD Electronically signed by Franki Monte Signature Date/Time: 10/08/2022/10:26:27 AM    Final    CT HEAD WO CONTRAST (5MM)  Result Date: 10/08/2022 CLINICAL DATA:  Neuro deficit stroke suspected EXAM: CT HEAD WITHOUT CONTRAST TECHNIQUE: Contiguous axial images were obtained from the base of the skull through the  vertex without intravenous contrast. RADIATION DOSE REDUCTION: This exam was performed according to the departmental dose-optimization program which includes automated exposure control, adjustment of the mA and/or kV according to patient size and/or use of iterative reconstruction technique. COMPARISON:  CT Head 10/07/22 FINDINGS: Brain: Assessment of portions of the right parietal, occipital, and temporal lobes is limited due to streak artifact from patient's cochlear implant. Within this limitation there is no hydrocephalus hemorrhage, extra-axial fluid collection, or mass effect. No CT evidence of an acute infarct. Vascular: No hyperdense vessel or unexpected calcification. Skull: Postoperative changes from placement of a right-sided cochlear implant. Negative for fracture or focal lesion. Sinuses/Orbits: Bilateral lens replacement. There is a right-sided cochlear place. Other: None IMPRESSION: Assessment of portions of the right parietal, occipital, and temporal lobes is limited due to streak artifact from patient's cochlear implant. Within this limitation there is no acute intracranial abnormality. Electronically Signed   By: Marin Roberts M.D.   On: 10/08/2022 09:01   CT ANGIO HEAD NECK W WO CM (CODE STROKE)  Result Date: 10/07/2022 CLINICAL DATA:  Acute neuro deficit rule out stroke EXAM: CT ANGIOGRAPHY HEAD AND NECK TECHNIQUE: Multidetector CT imaging of the head and neck was performed using the standard protocol during bolus administration of intravenous contrast. Multiplanar CT image reconstructions and MIPs were obtained to evaluate the vascular anatomy. Carotid stenosis measurements (when applicable) are obtained utilizing NASCET criteria, using the distal internal carotid diameter as the denominator. RADIATION DOSE REDUCTION: This exam was performed according to the departmental dose-optimization program which includes automated exposure control, adjustment of the mA and/or kV according to patient  size and/or use of iterative reconstruction technique. CONTRAST:  24m OMNIPAQUE IOHEXOL 350 MG/ML SOLN COMPARISON:  CT head 10/07/2022 FINDINGS: CTA NECK FINDINGS Aortic arch: Atherosclerotic calcification aortic arch, mild. Proximal great vessels patent without stenosis. There is streak artifact obscuring the proximal great vessels and carotid bifurcation bilaterally due left shoulder replacement. Right carotid system: Atherosclerotic calcification right  carotid bifurcation. Lumen difficult to accurately measure due to streak artifact and motion. Estimated 55% diameter stenosis proximal right internal carotid artery. Left carotid system: Atherosclerotic calcification left carotid bifurcation without significant stenosis. Vertebral arteries: Both vertebral arteries patent to the skull base without stenosis. Left vertebral artery dominant. Skeleton: Cervical spondylosis.  No acute skeletal abnormality. Other neck: Negative for mass or adenopathy in the neck. Left shoulder replacement. Upper chest: Lung apices clear bilaterally. Chronic right anterior rib fracture. Review of the MIP images confirms the above findings CTA HEAD FINDINGS Anterior circulation: Mild atherosclerotic calcification in the cavernous carotid bilaterally. Negative for stenosis. Anterior and middle cerebral arteries patent without large vessel occlusion or flow limiting stenosis. Negative for aneurysm. Streak artifact from right cochlear implant. Posterior circulation: Both vertebral arteries patent to the basilar. PICA patent bilaterally. Basilar widely patent. Superior cerebellar and posterior cerebral arteries patent without stenosis or large vessel occlusion. Venous sinuses: Limited venous enhancement.  No focal abnormality Anatomic variants: None Review of the MIP images confirms the above findings IMPRESSION: 1. Negative for intracranial large vessel occlusion. 2. Atherosclerotic calcification right carotid bifurcation with estimated 55%  diameter stenosis proximal right internal carotid artery. 3. Atherosclerotic calcification left carotid bifurcation without significant stenosis. 4. Both vertebral arteries patent to the basilar. 5. Aortic atherosclerosis. Aortic Atherosclerosis (ICD10-I70.0). Electronically Signed   By: Franchot Gallo M.D.   On: 10/07/2022 12:53   CT HEAD CODE STROKE WO CONTRAST  Result Date: 10/07/2022 CLINICAL DATA:  Code stroke.  Acute neuro deficit.  Rule out stroke EXAM: CT HEAD WITHOUT CONTRAST TECHNIQUE: Contiguous axial images were obtained from the base of the skull through the vertex without intravenous contrast. RADIATION DOSE REDUCTION: This exam was performed according to the departmental dose-optimization program which includes automated exposure control, adjustment of the mA and/or kV according to patient size and/or use of iterative reconstruction technique. COMPARISON:  CT head 03/15/2021 FINDINGS: Brain: Cochlear implant on the right causing significant streak artifact. Ventricle size normal. Patchy white matter hypodensity bilaterally is similar to the prior study. Negative for acute infarct, hemorrhage, mass Vascular: Negative for hyperdense vessel Skull: Negative Sinuses/Orbits: Paranasal sinuses clear. Bilateral cataract extraction Other: None ASPECTS ([place] Stroke Program Early CT Score) - Ganglionic level infarction (caudate, lentiform nuclei, internal capsule, insula, M1-M3 cortex): 7 - Supraganglionic infarction (M4-M6 cortex): 3 Total score (0-10 with 10 being normal): 10 IMPRESSION: 1. No acute intracranial abnormality. 2. Aspects is 10. 3. Extensive artifact from cochlear implant on the right. 4. Code stroke imaging results were communicated on 10/07/2022 at 12:27 pm to provider [name] via [facility] text page Electronically Signed   By: Franchot Gallo M.D.   On: 10/07/2022 12:27    Labs:  Basic Metabolic Panel: Recent Labs  Lab 10/13/22 0936 10/14/22 0602 10/15/22 0541 10/16/22 0543  NA  127* 127* 131* 131*  K 3.8 3.6 3.7 3.7  CL 91* 93* 96* 96*  CO2 '26 24 26 26  '$ GLUCOSE 171* 127* 119* 119*  BUN '10 11 9 11  '$ CREATININE 0.58 0.62 0.66 0.67  CALCIUM 10.2 9.5 9.7 9.7    CBC: Recent Labs  Lab 10/14/22 0602 10/15/22 0541 10/16/22 0543  WBC 6.4 6.9 6.3  HGB 13.8 14.4 13.7  HCT 39.6 39.7 39.6  MCV 95.4 94.7 96.8  PLT 148* 156 150    CBG: Recent Labs  Lab 10/18/22 2131 10/19/22 0606 10/19/22 1208 10/19/22 1631 10/19/22 2100  GLUCAP 186* 119* 83 98 154*    Brief HPI:  TERRIAN RIDLON is a 76 y.o. right-handed female who was admitted with strokelike symptoms status post TNK who had continued impaired speech intermittent headache blurred vision and diplopia.  Admitted to CIR 12/24 for above deficits.   Hospital Course: LANISSA CASHEN was admitted to rehab 10/11/2022 for inpatient therapies to consist of PT, ST and OT at least three hours five days a week. Past admission physiatrist, therapy team and rehab RN have worked together to provide customized collaborative inpatient rehab.  Pertaining to patient CVA status post TNK.  Remain on aspirin therapy follow-up neurology service.  Chronic pain maintained on Cymbalta as well as Norco.  Mood stabilization with the use of Klonopin as well as Wellbutrin/Lamictal and follow up Neuro Psychology.  Blood pressure controlled on Cozaar, hydrochlorothiazide Norvasc and would need outpatient follow-up.  Lipitor ongoing for hyperlipidemia.  Hearing impaired as well as history of Mnire's disease with cochlear implants.  Obesity BMI 31.99 with dietary follow-up.  Bouts of hyponatremia 131 and monitored with 1800 fluid restriction.  Prediabetes blood sugars well-controlled.   Blood pressures were monitored on TID basis and controlled     Rehab course: During patient's stay in rehab weekly team conferences were held to monitor patient's progress, set goals and discuss barriers to discharge. At admission, patient required min mod  assist  Physical exam.  Blood pressure 118/70 pulse 80 temperature 98 respirations 18 oxygen saturations 92% room air Constitutional.  No acute distress HEENT Head.  Normocephalic and atraumatic Eyes.  Pupils round and reactive to light no discharge without nystagmus Neck.  Supple nontender no JVD without thyromegaly Cardiac regular rate and rhythm without any extra sounds or murmur heard Abdomen.  Soft nontender positive bowel sounds without rebound Respiratory effort normal no respiratory distress without wheeze Musculoskeletal.  5/5 strength throughout Neurologic.  Alert oriented x 3.  Patient was hard of hearing  He/She  has had improvement in activity tolerance, balance, postural control as well as ability to compensate for deficits. He/She has had improvement in functional use RUE/LUE  and RLE/LLE as well as improvement in awareness.  Supine to sit supervision.  Ambulates 100 feet rolling walker supervision.  Perform stand pivot transfers rolling walker supervision.  Gather his belongings for activities of daily living and homemaking.  SLP facilitated moderately complex problem ID/solution generating task with patient benefiting from overall minimal verbal cues for problem ID and supervision assist cues for solution generating.  It was established need for supervision on discharge.  Family teaching completed and discharged to home       Disposition: Discharge to home    Diet: Regular  Special Instructions: No driving smoking or alcohol  Medications at discharge. 1.  Tylenol as needed 2.  Norvasc 5 mg p.o. daily 3.  Aspirin 81 mg p.o. daily 4.  Lipitor 20 mg p.o. nightly 5.  B complex with vitamin C 1 tablet daily 6.  Wellbutrin XL 300 mg p.o. daily 7.  Klonopin 1 mg p.o. nightly 8.  Cymbalta 60 mg p.o. twice daily 9.  HCTZ 25 mg p.o. daily 10.  Hydrocodone 10-325 mg 1 tablet every 12 hours as needed 11.  Lamictal 300 mg p.o. daily 12.  Cozaar 100 mg p.o. daily 13.   Magnesium gluconate 250 mg p.o. nightly 14.  Protonix 40 mg p.o. daily 15.  Trazodone 50 mg p.o. nightly 16.  Vitamin D 50,000 units every 7 days 17.  Singulair 10 mg nightly 18.  Neurontin 300 mg nightly 19.  Glucophage  750 mg every evening  30-35 minutes were spent completing discharge summary and discharge planning  Discharge Instructions     Ambulatory referral to Neurology   Complete by: As directed    An appointment is requested in approximately: CVA status post TNK   Ambulatory referral to Physical Medicine Rehab   Complete by: As directed    Moderate complexity follow-up in 2 weeks CVA         Signed: Cathlyn Parsons 10/20/2022, 5:31 AM

## 2022-10-19 DIAGNOSIS — I639 Cerebral infarction, unspecified: Secondary | ICD-10-CM | POA: Diagnosis not present

## 2022-10-19 LAB — GLUCOSE, CAPILLARY
Glucose-Capillary: 119 mg/dL — ABNORMAL HIGH (ref 70–99)
Glucose-Capillary: 83 mg/dL (ref 70–99)
Glucose-Capillary: 98 mg/dL (ref 70–99)
Glucose-Capillary: 154 mg/dL — ABNORMAL HIGH (ref 70–99)

## 2022-10-19 NOTE — Progress Notes (Signed)
Occupational Therapy Discharge Summary  Patient Details  Name: Elizabeth Stone MRN: [id number] Date of Birth: December 21, 1945  Date of Discharge from OT service:October 20, 2021   Patient has met 14 of 14 long term goals due to improved activity tolerance, improved balance, postural control, ability to compensate for deficits, improved attention, improved awareness, and improved coordination.  Patient to discharge at overall Modified Independent level with basic ADLS and CGA to min A with higher level ADLs of meal prep, laundry, housekeeping,  Patient's care partner is independent to provide the necessary physical assistance at discharge.    Reasons goals not met: n/a  Recommendation:  No further OT services needed at this time.   Equipment: No equipment provided  Reasons for discharge: treatment goals met  Patient/family agrees with progress made and goals achieved: Yes  OT Discharge Precautions/Restrictions  Precautions Precautions: Fall Precaution Comments: h/o falls, Menierre's Disease s/p cochlear implant Restrictions Weight Bearing Restrictions: No ADL ADL Eating: Unable to assess Grooming: Contact guard Where Assessed-Grooming: Standing at sink Upper Body Bathing: Contact guard Where Assessed-Upper Body Bathing: Shower Lower Body Bathing: Contact guard Where Assessed-Lower Body Bathing: Shower Upper Body Dressing: Minimal assistance Where Assessed-Upper Body Dressing: Chair Lower Body Dressing: Minimal assistance Where Assessed-Lower Body Dressing: Chair Toileting: Unable to assess Toilet Transfer: Minimal assistance Toilet Transfer Method: Counselling psychologist: Grab bars Tub/Shower Transfer: Unable to assess Tub/Shower Transfer Method: Unable to assess Social research officer, government: Minimal assistance Social research officer, government Method: Heritage manager: Radio broadcast assistant ADL Comments: Patient has a shower seat at home but does not use it.   Patient's balance is biggest limiting factor for functional independence Vision Baseline Vision/History: 0 No visual deficits Patient Visual Report: Other (comment) (pt reports her double vision has resolved.) Eye Alignment: Within Functional Limits Ocular Range of Motion: Within Functional Limits;Other (comment) Alignment/Gaze Preference: Within Defined Limits Tracking/Visual Pursuits: Able to track stimulus in all quads without difficulty Saccades: Within functional limits Visual Fields: No apparent deficits Diplopia Assessment: Other (comment) (pt states it has resolved, but she has had diplopia during therapy sessions when very fatigued.  Advised pt to take several rest breaks when doing ADLs.) Perception  Perception: Within Functional Limits Praxis Praxis: Intact Cognition   Sensation Sensation Peripheral sensation comments: impaired distally with pt reporting feels numb in her feet - appears to be decreased throughout bilateral LEs Light Touch Impaired Details: Impaired RLE;Impaired LLE Hot/Cold: Appears Intact Proprioception: Impaired by gross assessment Stereognosis: Impaired by gross assessment Motor    Mobility     Trunk/Postural Assessment     Balance   Extremity/Trunk Assessment RUE Assessment Active Range of Motion (AROM) Comments: Can raise arm overhead h/o shoulder replacement General Strength Comments: 4-/5 RUE Body System: Ortho LUE Assessment Active Range of Motion (AROM) Comments: Can raise arm overhead.  H/O Shoulder Replacement General Strength Comments: 4-/5 Brunstrum levels for arm and hand: Arm;Hand Brunstrum level for arm: Stage V Relative Independence from Synergy Brunstrum level for hand: Stage VI Isolated joint movements   Elizabeth Stone 10/19/2022, 9:15 AM

## 2022-10-19 NOTE — Progress Notes (Signed)
PROGRESS NOTE   Subjective/Complaints: No issues overnite , ate 100% breakfast , slept well, had meds this am   ROS:  Pt denies SOB, abd pain, CP, N/V/C/D, and vision changes    Objective:   No results found. No results for input(s): "WBC", "HGB", "HCT", "PLT" in the last 72 hours.  No results for input(s): "NA", "K", "CL", "CO2", "GLUCOSE", "BUN", "CREATININE", "CALCIUM" in the last 72 hours.   Intake/Output Summary (Last 24 hours) at 10/19/2022 0803 Last data filed at 10/18/2022 1849 Gross per 24 hour  Intake 476 ml  Output --  Net 476 ml         Physical Exam: Vital Signs Blood pressure 138/63, pulse 69, temperature 98 F (36.7 C), resp. rate 17, SpO2 94 %. Gen: no distress, normal appearing, BMI 31.99    General: No acute distress Mood and affect are appropriate Heart: Regular rate and rhythm no rubs murmurs or extra sounds Lungs: Clear to auscultation, breathing unlabored, no rales or wheezes Abdomen: Positive bowel sounds, soft nontender to palpation, nondistended Extremities: No clubbing, cyanosis, or edema   Neurological: Very HOH- a cochlear implant on R side- put on cochlear aid  Neuro: Alert and oriented x3   Musculoskeletal: 5/5 strength BUE and BLE      Assessment/Plan: 1. Functional deficits which require 3+ hours per day of interdisciplinary therapy in a comprehensive inpatient rehab setting. Physiatrist is providing close team supervision and 24 hour management of active medical problems listed below. Physiatrist and rehab team continue to assess barriers to discharge/monitor patient progress toward functional and medical goals  Care Tool:  Bathing    Body parts bathed by patient: Right arm, Left arm, Chest, Abdomen, Front perineal area, Buttocks, Right upper leg, Left upper leg, Right lower leg, Left lower leg, Face         Bathing assist Assist Level: Contact Guard/Touching  assist     Upper Body Dressing/Undressing Upper body dressing   What is the patient wearing?: Bra, Pull over shirt    Upper body assist Assist Level: Minimal Assistance - Patient > 75%    Lower Body Dressing/Undressing Lower body dressing      What is the patient wearing?: Underwear/pull up, Pants     Lower body assist Assist for lower body dressing: Contact Guard/Touching assist     Toileting Toileting Toileting Activity did not occur (Clothing management and hygiene only): N/A (no void or bm)  Toileting assist Assist for toileting: Supervision/Verbal cueing     Transfers Chair/bed transfer  Transfers assist     Chair/bed transfer assist level: Contact Guard/Touching assist     Locomotion Ambulation   Ambulation assist      Assist level: Minimal Assistance - Patient > 75% Assistive device: Walker-rolling Max distance: 65   Walk 10 feet activity   Assist     Assist level: Minimal Assistance - Patient > 75% Assistive device: Walker-rolling   Walk 50 feet activity   Assist Walk 50 feet with 2 turns activity did not occur: Safety/medical concerns  Assist level: Minimal Assistance - Patient > 75% Assistive device: Walker-rolling    Walk 150 feet activity   Assist Walk 150  feet activity did not occur: Safety/medical concerns         Walk 10 feet on uneven surface  activity   Assist Walk 10 feet on uneven surfaces activity did not occur: Safety/medical concerns   Assist level: Minimal Assistance - Patient > 75% (ramp) Assistive device: Walker-rolling   Wheelchair     Assist Is the patient using a wheelchair?: Yes (pt with limited walking distance prior to hospitalization but did not require use of w/c for her functional mobility in the home - now using for transport to/from gym as this is further than pt walks at baseline) Type of Wheelchair: Manual    Wheelchair assist level: Dependent - Patient 0%      Wheelchair 50 feet  with 2 turns activity    Assist        Assist Level: Dependent - Patient 0%   Wheelchair 150 feet activity     Assist      Assist Level: Dependent - Patient 0%   Blood pressure 138/63, pulse 69, temperature 98 F (36.7 C), resp. rate 17, SpO2 94 %.  Medical Problem List and Plan: 1. Functional deficits secondary to stroke like symptoms s/p TNK             -patient may shower             -ELOS/Goals: 10/21/22             Continue CIR  Pt asking about therapy in [place] after d/c. - [facility] is closest [employer] site 2.  Antithrombotics: -DVT/anticoagulation:  Mechanical: Sequential compression devices, entire leg Bilateral lower extremities             -antiplatelet therapy: aspirin '81mg'$  daily 3. Neuropathy: continue cymbalta '60mg'$  BID.   12/30- Back pain- on scheduled tylenol- sometimes took Norco at home per chart- con't regimen- might benefit from Lidoderm patch.  4. Anxiety: decrease klonopin to '1mg'$  HS 5. Neuropsych/cognition: This patient is capable of making decisions on her own behalf. 6. HTN: continue cozaar '100mg'$  daily, HCTZ '25mg'$  daily, and Norvasc '5mg'$  dauly. Magnesium gluconate '250mg'$  ordered HS  12/30- BP very slightly elevated at 967 systlic- con't regimen Vitals:   10/18/22 1953 10/19/22 0431  BP: (!) 131/57 138/63  Pulse: 75 69  Resp: 17 17  Temp: 98.2 F (36.8 C) 98 F (36.7 C)  SpO2: 95% 94%    7. HLD: atorvastain increased to '20mg'$  as LDL is 77 and goal is <70 8. Hearing impaired: has cochlear implant in right  ear 9. 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 Chronic pain hx of multiple joint replacements (bilateral shoulders and knees) Fibromyalgia:will schedule tylenol , also on dilaudid, which she is taking only ~1 x per day   12/30- might try Lidoderm patches for back pain as well? 12. Fatigue: add vitamin B and B supplements 13. Suboptimal vitamin D: start ergocalciferol  50,000U once per week for 7 weeks 14. Blurry vision, discussed and has been stable 15. Hyponatremia: stable at 131    Latest Ref Rng & Units 10/16/2022    5:43 AM 10/15/2022    5:41 AM 10/14/2022    6:02 AM  BMP  Glucose 70 - 99 mg/dL 119  119  127   BUN 8 - 23 mg/dL '11  9  11   '$ Creatinine 0.44 - 1.00 mg/dL 0.67  0.66  0.62   Sodium 135 - 145 mmol/L 131  131  127  Potassium 3.5 - 5.1 mmol/L 3.7  3.7  3.6   Chloride 98 - 111 mmol/L 96  96  93   CO2 22 - 32 mmol/L '26  26  24   '$ Calcium 8.9 - 10.3 mg/dL 9.7  9.7  9.5     16. Low back pain: kpad ordered, will schedule tylenol    LOS: 8 days A FACE TO FACE EVALUATION WAS PERFORMED  Charlett Blake 10/19/2022, 8:03 AM

## 2022-10-19 NOTE — Progress Notes (Signed)
Occupational Therapy Session Note  Patient Details  Name: Elizabeth Stone MRN: [id number] Date of Birth: 12/13/1945  Today's Date: 10/19/2022 OT Individual Time: 7614-7092 OT Individual Time Calculation (min): 75 min    Short Term Goals: Week 1:  OT Short Term Goal 1 (Week 1): STGS = LTGs  Skilled Therapeutic Interventions/Progress Updates:    Pt received in bathroom with Nurse tech present.  NT left and pt finished her toileting with mod I and then ambulated to sink to wash hands.  Pt stated she wants to shower tomorrow as nurse tech helped her shower yesterday.  Cued pt to get ready for the day as she would at home and as if I was not present to determine if pt would be safe enough to be mod I at home.   Pt ambulated with RW to closet to retrieve clothing but was breathing heavily.  Encouraged her to take breaks as needed, which she did. Pt layed down to rest briefly and finish her coffee.  Pt then dressed and returned to closet to change out hangers and clothing with mod I. Pt sounds as if she is breathing hard but stated she is not out of breath she just is in a habit of making those sounds.   Pt stated her diplopia has resolved.  In therapy on Sat, she did have a momentary issue with diplopia when she was tired. Discussed how if she has that symptom that is a sign to rest.   Pt then engaged in UE exercises. Pt has been going to a gym and working with heavier weights with 8 lb dumbells. She has arthritis in fingers which has caused deformity in the fingers with an ulnar drift. Recommended she start using lighter hand weights of 2 - 3 lbs and increasing repetitions for same strengthening effect.  Pt worked on AROM arm circles for shoulders, theraband rows,  3 lb dowel bar curls and then extending arms with bar.  Pt opted to rest back in bed. All needs met and alarm set.    Therapy Documentation Precautions:  Precautions Precautions: Fall Precaution Comments: h/o falls, Menierre's Disease s/p  cochlear implant Restrictions Weight Bearing Restrictions: No    Vital Signs: Therapy Vitals Temp: 98 F (36.7 C) Pulse Rate: 69 Resp: 17 BP: 138/63 Patient Position (if appropriate): Lying Oxygen Therapy SpO2: 94 % O2 Device: Room Air  Pain:  No c/o pain       Therapy/Group: Individual Therapy  [place] 10/19/2022, 8:20 AM

## 2022-10-19 NOTE — Progress Notes (Signed)
Physical Therapy Session Note  Patient Details  Name: Elizabeth Stone MRN: [id number] Date of Birth: 1946/08/30  Today's Date: 10/19/2022 PT Individual Time: 1121-1205 PT Individual Time Calculation (min): 44 min   Short Term Goals: Week 1:  PT Short Term Goal 1 (Week 1): = to LTGs based on ELOS  Skilled Therapeutic Interventions/Progress Updates:  Patient supine in bed on entrance to room. Patient alert and agreeable to PT session.   Patient with no pain complaint at start of session.  Therapeutic Activity: Bed Mobility: Pt performed supine <> sit with Mod I requiring extra time to reach EOB. No cueing for technique. Transfers: Initial sit-->stand from bedside performed with posterior bias and pt requiring BLE knee extension and press against EOB in order to maintain upright positioning. Pt performed sit<>stand and stand pivot transfers throughout rest of session with close supervision/ intermittent CGA for balance.. Provided verbal cues for technique.  5xSTS performed with time of 21.3 seconds. One instance of minimal LOB requiring a step to maintain balance. (A score of 15 seconds or greater indicates patient is at an increased risk for falls. Education provided to patient on interpretation of balance score)    Gait Training:  Pt ambulated 130' x2/ 95' x1/  108' x1  using RW with close supervision. Ambulated 58' x1/ 20' x3 with no AD and CGA. Demonstrated increased lateral sway without walker as compared to with RW. Shorter stride taken without RW. Provided vc/ tc for intermittent need to increase step height.  Neuromuscular Re-ed: NMR facilitated during session with focus on standing balance. Pt guided in stance on Airex pad while performing foam lego building task. Is able to complete in one bout of standing on Airex foam with CGA/ MinA for one instance of post balance loss. Progressed to standing on low foam wedge for stretch to gastroc/ soleus group and to improve proprioception to  decrease posterior bias. NMR performed for improvements in motor control and coordination, balance, sequencing, judgement, and self confidence/ efficacy in performing all aspects of mobility at highest level of independence.   Patient supine in bed at end of session with brakes locked, bed alarm set, and all needs within reach. Cryotherapy ice pack applied to low back while supine in bed prior to lunch arrival.    Therapy Documentation Precautions:  Precautions Precautions: Fall Precaution Comments: h/o falls, Menierre's Disease s/p cochlear implant Restrictions Weight Bearing Restrictions: No General:   Vital Signs:   Pain: Pain Assessment Pain Scale: 0-10 Pain Score: 0-No pain   Therapy/Group: Individual Therapy  Alger Simons PT, DPT, CSRS 10/19/2022, 12:28 PM

## 2022-10-19 NOTE — Progress Notes (Signed)
Speech Language Pathology Daily Session Note  Patient Details  Name: Elizabeth Stone MRN: [id number] Date of Birth: Nov 11, 1945  Today's Date: 10/19/2022 SLP Individual Time: 2992-4268 SLP Individual Time Calculation (min): 40 min  Short Term Goals: Week 1: SLP Short Term Goal 1 (Week 1): STG=LTG due to short ELOS (5-7 days)  Skilled Therapeutic Interventions: Skilled treatment session focused on cognitive goals. Upon arrival, patient was awake in bed and agreeable to treatment session. SLP facilitated session by administering the Cognistat in order to assess cognitive functioning and overall progress. Patient scored WFL on all subtests with the exception of mild impairments in calculations which patient reports is baseline. SLP also facilitated session by providing education regarding patient's current cognitive functioning and strategies to utilize at home to maximize recall and overall safety. Patient verbalized understanding and handouts were given to reinforce information. Patient left upright in bed with alarm on and all needs within reach. Continue with current plan of care.      Pain No/Denies Pain   Therapy/Group: Individual Therapy  Sameen Leas 10/19/2022, 3:09 PM

## 2022-10-19 NOTE — Progress Notes (Signed)
Occupational Therapy Session Note  Patient Details  Name: Elizabeth Stone MRN: [id number] Date of Birth: January 16, 1946  Today's Date: 10/19/2022 OT Individual Time: 3295-1884 OT Individual Time Calculation (min): 31 min    Short Term Goals: Week 1:  OT Short Term Goal 1 (Week 1): STGS = LTGs  Skilled Therapeutic Interventions/Progress Updates:  Pt greeted supine in bed, pt agreeable to OT intervention. Session focus on BUE strength/endurance, functional mobility,  and decreasing overall caregiver burden.         Pt completed supine>sit MOD I and completed stand pivot transfer with no AD to w/c with CGA. Pt declined need for ADLs. Pt transported to gym in w/c with total A for time mgmt. Focused session on implementing BUE HEP for home. Pt completed below therex with level 3 theraband. Pt needed up to MOD cues for body mechanics/positioning and to incorporate breath into  movements as pt noted to often hold her breath during therex. Had pt return demo therex with handout provided to ensure carryover.         X10 shoulder flexion  X10 bicep curls X10 shoulder horizontal ABD X10 shoulder diagonal pulls X10 shoulder extension  X10 alternating punches   Ended session with pt supine in bed with all needs within reach and bed alarm activated.                    Therapy Documentation Precautions:  Precautions Precautions: Fall Precaution Comments: h/o falls, Menierre's Disease s/p cochlear implant Restrictions Weight Bearing Restrictions: No    Pain: unrated pain reported in back, ice and rest breaks provided.     Therapy/Group: Individual Therapy  [name] [facility] 10/19/2022, 3:30 PM

## 2022-10-20 ENCOUNTER — Other Ambulatory Visit (HOSPITAL_COMMUNITY): Payer: Self-pay

## 2022-10-20 DIAGNOSIS — F0630 Mood disorder due to known physiological condition, unspecified: Secondary | ICD-10-CM

## 2022-10-20 DIAGNOSIS — R2990 Unspecified symptoms and signs involving the nervous system: Secondary | ICD-10-CM

## 2022-10-20 DIAGNOSIS — I639 Cerebral infarction, unspecified: Secondary | ICD-10-CM | POA: Diagnosis not present

## 2022-10-20 LAB — GLUCOSE, CAPILLARY
Glucose-Capillary: 97 mg/dL (ref 70–99)
Glucose-Capillary: 159 mg/dL — ABNORMAL HIGH (ref 70–99)
Glucose-Capillary: 111 mg/dL — ABNORMAL HIGH (ref 70–99)
Glucose-Capillary: 136 mg/dL — ABNORMAL HIGH (ref 70–99)

## 2022-10-20 MED ORDER — DULOXETINE HCL 60 MG PO CPEP
60.0000 mg | ORAL_CAPSULE | Freq: Two times a day (BID) | ORAL | 0 refills | Status: AC
  Filled 2022-10-20: qty 60, 30d supply, fill #0

## 2022-10-20 MED ORDER — ATORVASTATIN CALCIUM 20 MG PO TABS
20.0000 mg | ORAL_TABLET | Freq: Every day | ORAL | 0 refills | Status: AC
  Filled 2022-10-20: qty 30, 30d supply, fill #0

## 2022-10-20 MED ORDER — HYDROCODONE-ACETAMINOPHEN 10-325 MG PO TABS
1.0000 | ORAL_TABLET | Freq: Two times a day (BID) | ORAL | 0 refills | Status: AC | PRN
  Filled 2022-10-20: qty 30, 15d supply, fill #0

## 2022-10-20 MED ORDER — ESOMEPRAZOLE MAGNESIUM 20 MG PO CPDR
20.0000 mg | DELAYED_RELEASE_CAPSULE | Freq: Every day | ORAL | 0 refills | Status: DC
  Filled 2022-10-20: qty 30, 30d supply, fill #0

## 2022-10-20 MED ORDER — VITAMIN D (ERGOCALCIFEROL) 1.25 MG (50000 UNIT) PO CAPS
50000.0000 [IU] | ORAL_CAPSULE | ORAL | 0 refills | Status: AC
  Filled 2022-10-20: qty 5, 35d supply, fill #0

## 2022-10-20 MED ORDER — ACETAMINOPHEN 325 MG PO TABS: 325.0000 mg | ORAL_TABLET | ORAL | Status: AC | PRN

## 2022-10-20 MED ORDER — MAGNESIUM OXIDE 400 MG PO TABS
200.0000 mg | ORAL_TABLET | Freq: Every day | ORAL | 0 refills | Status: DC
  Filled 2022-10-20: qty 15, 30d supply, fill #0

## 2022-10-20 MED ORDER — AMLODIPINE BESYLATE 5 MG PO TABS
5.0000 mg | ORAL_TABLET | Freq: Every day | ORAL | 0 refills | Status: DC
  Filled 2022-10-20: qty 30, 30d supply, fill #0

## 2022-10-20 MED ORDER — CLONAZEPAM 1 MG PO TABS
1.0000 mg | ORAL_TABLET | Freq: Every day | ORAL | 0 refills | Status: AC
  Filled 2022-10-20: qty 30, 30d supply, fill #0

## 2022-10-20 MED ORDER — LOSARTAN POTASSIUM 100 MG PO TABS
100.0000 mg | ORAL_TABLET | Freq: Every day | ORAL | 0 refills | Status: AC
  Filled 2022-10-20: qty 30, 30d supply, fill #0

## 2022-10-20 MED ORDER — LAMOTRIGINE 150 MG PO TABS
300.0000 mg | ORAL_TABLET | Freq: Every day | ORAL | 0 refills | Status: DC
  Filled 2022-10-20: qty 60, 30d supply, fill #0

## 2022-10-20 MED ORDER — BUPROPION HCL ER (XL) 300 MG PO TB24
300.0000 mg | ORAL_TABLET | Freq: Every day | ORAL | 0 refills | Status: AC
  Filled 2022-10-20 – 2022-10-21 (×2): qty 30, 30d supply, fill #0

## 2022-10-20 MED ORDER — TRAZODONE HCL 50 MG PO TABS
50.0000 mg | ORAL_TABLET | Freq: Every day | ORAL | 0 refills | Status: DC
  Filled 2022-10-20: qty 30, 30d supply, fill #0

## 2022-10-20 MED ORDER — B COMPLEX-C PO TABS
1.0000 | ORAL_TABLET | Freq: Every day | ORAL | 0 refills | Status: AC
  Filled 2022-10-20: qty 30, 30d supply, fill #0

## 2022-10-20 MED ORDER — HYDROCHLOROTHIAZIDE 25 MG PO TABS
25.0000 mg | ORAL_TABLET | Freq: Every day | ORAL | 0 refills | Status: DC
  Filled 2022-10-20: qty 30, 30d supply, fill #0

## 2022-10-20 MED ORDER — GABAPENTIN 300 MG PO CAPS
300.0000 mg | ORAL_CAPSULE | Freq: Every day | ORAL | 0 refills | Status: AC
  Filled 2022-10-20: qty 30, 30d supply, fill #0

## 2022-10-20 MED ORDER — METFORMIN HCL ER 750 MG PO TB24
750.0000 mg | ORAL_TABLET | Freq: Every evening | ORAL | 0 refills | Status: DC
  Filled 2022-10-20: qty 30, 30d supply, fill #0

## 2022-10-20 MED ORDER — MONTELUKAST SODIUM 10 MG PO TABS
10.0000 mg | ORAL_TABLET | Freq: Every day | ORAL | 0 refills | Status: AC
  Filled 2022-10-20: qty 30, 30d supply, fill #0

## 2022-10-20 NOTE — Progress Notes (Signed)
Inpatient Rehabilitation Care Coordinator Discharge Note   Patient Details  Name: Elizabeth Stone MRN: [id number] Date of Birth: 11/22/1945   Discharge location: Home  Length of Stay: 10 Days  Discharge activity level: sup  Home/community participation: son and SIL  Patient response EE:FEOFHQ Literacy - How often do you need to have someone help you when you read instructions, pamphlets, or other written material from your doctor or pharmacy?: Often  Patient response RF:XJOITG Isolation - How often do you feel lonely or isolated from those around you?: Never  Services provided included: Neuropsych, Pharmacy, TR, RN, CM, SLP, OT, RD, PT, MD  Financial Services:  Financial Services Utilized: [place] offered to/list presented to: patient  Follow-up services arranged:  [place]: [name]         Patient response to transportation need: Is the patient able to respond to transportation needs?: Yes In the past 12 months, has lack of transportation kept you from medical appointments or from getting medications?: No In the past 12 months, has lack of transportation kept you from meetings, work, or from getting things needed for daily living?: No    Comments (or additional information):  Patient/Family verbalized understanding of follow-up arrangements:  Yes  Individual responsible for coordination of the follow-up plan: son, [name]  Confirmed correct DME delivered: Dyanne Iha 10/20/2022    Dyanne Iha

## 2022-10-20 NOTE — Progress Notes (Signed)
Speech Language Pathology Discharge Summary  Patient Details  Name: Elizabeth Stone MRN: [id number] Date of Birth: 1946/09/28  Date of Discharge from [place] service:October 20, 2022   Patient has met 4 of 4 long term goals.  Patient to discharge at overall Supervision level.   Reasons goals not met: N/A  Clinical Impression/Discharge Summary: Patient has made functional gains and has met 4 of 4 LTGs this admission. Currently, patient requires overall supervision level-Min A verbal cues to complete functional and mildly complex tasks safely in regards to problem solving, selective attention,  and recall with use of compensatory strategies. Patient also is ~80-90% intelligible at the phrase and sentence level with supervision level verbal cues for use of speech intelligibility strategies as patient with baseline impairments in speech intelligibility over the past year with recommendations for SLP per her audiologist. Patient education is complete and patient will discharge home with 24 hour supervision from family. Patient would benefit from f/u SLP services to maximize her cognitive functioning and functional communication in order to reduce caregiver burden.   Care Partner:  Caregiver Able to Provide Assistance: Yes  Type of Caregiver Assistance: Physical;Cognitive  Recommendation:  Home Health SLP;24 hour supervision/assistance  Rationale for SLP Follow Up: Reduce caregiver burden;Maximize cognitive function and independence;Maximize functional communication   Equipment: N/A   Reasons for discharge: Discharged from hospital;Treatment goals met   Patient/Family Agrees with Progress Made and Goals Achieved: Yes    [place], [place] 10/20/2022, 10:31 AM

## 2022-10-20 NOTE — Consult Note (Signed)
Neuropsychological Consultation   Patient:   Elizabeth Stone   DOB:   07-03-1946  MR Number:  [id number]  Location:  [place] [address] C[facility] [address] [place] [id number] [place] [address] Dept: [place]: [phone]           Date of Service:   10/20/2022  Start Time:   2 PM End Time:   3 PM  Provider/Observer:  Ilean Skill, Psy.D.       Clinical Neuropsychologist       Billing Code/Service: [phone]  Reason for Service:    DIANEY Stone is a 77 year old female currently being treated on the comprehensive inpatient rehabilitation unit with ongoing debility and residual symptoms consistent with strokelike symptoms.  Patient was referred for neuropsychological consultation due to coping and adjustment with the past significant medical history including inpatient hospitalizations for depression, anxiety and has also carried past diagnosis of bipolar affective disorder.  Patient is currently being followed by out of system provider so review of recent notes is not available.  Current psychotropic medications include Wellbutrin, Klonopin, and Lamictal.  Patient also has significant orthopedic issues and has taken opiate-based medications for many years.  Patient with severe episodes of depressive symptomatology and questions about possible suicide attempts in the past although patient always denied that.  Patient has not always been compliant with medications and has a history of self medicate in the past with marijuana and alcohol.  Patient has a past medical history including hard of hearing status postcochlear implant, hypertension, hyperlipidemia, Mnire's disease, fibromyalgia, bipolar disorder with anxiety and depression.  Patient presented to the emergency department at [facility] on 10/07/2022 after being brought in by EMS and code stroke.  Patient had visual deficit, dysarthria neglect.  Patient also reported severe  headache in the front of her head and neck pain with blurry and double vision.  Patient continues to report blurry vision but has improved recently.  Repeat CT head did not demonstrate evidence of acute process or stroke even though she continued with strokelike symptoms.  Patient continued to have symptoms and wants therapy evaluations were completed she was admitted to the comprehensive inpatient rehabilitation program.  Patient is to be discharged tomorrow and has made significant improvements but continues with vision issues.  Her dysarthric speech is due to patient deaf/very hard of hearing her entire life and having a cochlear implant put in roughly a decade ago.  Patient continues to rely on reading lips as well as benefit of hearing via cochlear implant.  Patient's speech deficits are very likely primarily related to her hearing deficits that are lifelong.   Current Status:  Patient reports that her mood is good and that she has not experienced an exacerbation of her psychiatric symptoms.  Patient reports that she really ran into trouble roughly 15 years ago after the death (suicide) of her sister and has since had another sister and niece committed suicide.  The patient reports that these have always been very stressful for her.  Patient describes episodes consistent with an underlying bipolar disorder.  Patient reports that she is more compliant with her medication regimen that she has in the past.  Patient denies any current suicidal ideation and denies any exacerbation of her depression or anxiety.  She is improving with therapy.  Behavioral Observation: Elizabeth Stone  presents as a 77 y.o.-year-old Right handed Caucasian Female who appeared her stated age. her dress was Appropriate and  she was Well Groomed and her manners were Appropriate to the situation.  her participation was indicative of Redirectable behaviors.  There were physical disabilities noted.  she displayed an appropriate level of  cooperation and motivation.    Interactions:    Active Redirectable  Attention:   abnormal and attention span appeared shorter than expected for age  Memory:   within normal limits; recent and remote memory intact  Visuo-spatial:  abnormal convergence and tracking issues noted  Speech (Volume):  low  Speech:   slurred; garbled  Thought Process:  Coherent and Relevant  Though Content:  WNL; not suicidal and not homicidal  Orientation:   person, place, time/date, and situation  Judgment:   Fair  Planning:   Fair  Affect:    Appropriate  Mood:    Euthymic  Insight:   Good  Intelligence:   normal  Substance Use:  Patient does have a history of prior use of marijuana to self medicate her symptoms as well as previous alcohol use/abuse and use of prescription pain medicines.  Medical History:   Past Medical History:  Diagnosis Date   Acute blood loss anemia    hx   Anxiety    Arthritis    Balance problem    Bipolar disorder ([place])    Cochlear implant in place    Cochlear implant in place    right ear   Depression    Dermatitis    on face   Dyspnea    Fibromyalgia    GERD (gastroesophageal reflux disease)    Hard of hearing    Hearing aid worn    left ear   Hearing impaired    has cochlear implant   HTN (hypertension)    Hypokalemia    Meniere disease    takes HCTZ for her inner ear problem   Mixed hyperlipidemia 09/27/2013   Osteoporosis    Pneumonia 11/2016   Pre-diabetes    Primary localized osteoarthritis of left knee 11/16/2018   Primary localized osteoarthritis of right knee    Slow transit constipation    Unsteady gait          Patient Active Problem List   Diagnosis Date Noted   Stroke-like symptom 10/17/2022   Cerebellar stroke ([place]) 10/11/2022   Stroke ([place]) 10/07/2022   History of cochlear implant 11/25/2020   Gait abnormality 10/07/2020   Bilateral low back pain with bilateral sciatica 10/07/2020   Dyspnea on exertion 04/30/2020    Hallux rigidus of right foot 12/24/2019   Hammer toes of both feet 12/24/2019   Primary localized osteoarthritis of left knee 11/16/2018   S/P reverse total shoulder arthroplasty, left 01/07/2017   Bipolar I disorder with depression, severe ([place]) 10/26/2016   Substance use disorder 10/25/2016   Severe episode of recurrent major depressive disorder, without psychotic features ([place])    Impacted cerumen of left ear 03/23/2016   Primary localized osteoarthritis of right knee    Rotator cuff tear arthropathy 12/13/2014   Ulcer of right second toe ([place]) 12/21/2013   Family history of ischemic heart disease 09/27/2013   Essential hypertension, benign 09/27/2013   Mixed hyperlipidemia 09/27/2013   Arthritis    Anxiety    Depression    GERD (gastroesophageal reflux disease)    Meniere disease    Osteoporosis    Hearing impaired         Abuse/Trauma History: Patient with past losses of two sisters and one niece due to suicide  Psychiatric History:  Patient  with long psychiatric history that she reports really worsened after one of her sisters committed suicide.  Death of 2nd husband exacerbated alcohol use and self medicated with marijuana for a time after his death.    Family Med/Psych History:  Family History  Problem Relation Age of Onset   Heart disease Mother    Breast cancer Mother    Cervical cancer Mother    Diabetes Mother    Heart disease Father    Diabetes Maternal Aunt    Colon cancer Neg Hx    Esophageal cancer Neg Hx    Rectal cancer Neg Hx    Stomach cancer Neg Hx    Liver cancer Neg Hx    Pancreatic cancer Neg Hx     Risk of Suicide/Violence: low Patient denies any current suicidal ideation.  This should be monitored post discharge by psychiatry, who continue to follow patient.  Impression/DX:  JAYLEA PLOURDE is a 77 year old female currently being treated on the comprehensive inpatient rehabilitation unit with ongoing debility and residual symptoms consistent with  strokelike symptoms.  Patient was referred for neuropsychological consultation due to coping and adjustment with the past significant medical history including inpatient hospitalizations for depression, anxiety and has also carried past diagnosis of bipolar affective disorder.  Patient is currently being followed by out of system provider so review of recent notes is not available.  Current psychotropic medications include Wellbutrin, Klonopin, and Lamictal.  Patient also has significant orthopedic issues and has taken opiate-based medications for many years.  Patient with severe episodes of depressive symptomatology and questions about possible suicide attempts in the past although patient always denied that.  Patient has not always been compliant with medications and has a history of self medicate in the past with marijuana and alcohol.  Patient has a past medical history including hard of hearing status postcochlear implant, hypertension, hyperlipidemia, Mnire's disease, fibromyalgia, bipolar disorder with anxiety and depression.  Patient presented to the emergency department at [facility] on 10/07/2022 after being brought in by EMS and code stroke.  Patient had visual deficit, dysarthria neglect.  Patient also reported severe headache in the front of her head and neck pain with blurry and double vision.  Patient continues to report blurry vision but has improved recently.  Repeat CT head did not demonstrate evidence of acute process or stroke even though she continued with strokelike symptoms.  Patient continued to have symptoms and wants therapy evaluations were completed she was admitted to the comprehensive inpatient rehabilitation program.  Patient is to be discharged tomorrow and has made significant improvements but continues with vision issues.  Her dysarthric speech is due to patient deaf/very hard of hearing her entire life and having a cochlear implant put in roughly a decade ago.  Patient continues  to rely on reading lips as well as benefit of hearing via cochlear implant.  Patient's speech deficits are very likely primarily related to her hearing deficits that are lifelong.  Patient reports that her mood is good and that she has not experienced an exacerbation of her psychiatric symptoms.  Patient reports that she really ran into trouble roughly 15 years ago after the death (suicide) of her sister and has since had another sister and niece committed suicide.  The patient reports that these have always been very stressful for her.  Patient describes episodes consistent with an underlying bipolar disorder.  Patient reports that she is more compliant with her medication regimen that she has in the past.  Patient denies any current suicidal ideation and denies any exacerbation of her depression or anxiety.  She is improving with therapy.  Disposition/Plan:  Worked on coping and adjustment issues and issues about expected recovery and plan of outpatient rehab.  Diagnosis:    Cerebrovascular accident (CVA) due to thrombosis of right vertebral artery ([place]) - Plan: Ambulatory referral to Physical Medicine Rehab  Cerebrovascular accident (CVA) due to thrombosis of precerebral artery [facility]) - Plan: Ambulatory referral to Neurology         Electronically Signed   _______________________ Ilean Skill, Psy.D. Clinical Neuropsychologist

## 2022-10-20 NOTE — Plan of Care (Signed)
  Problem: RH Expression Communication Goal: LTG Patient will increase speech intelligibility (SLP) Description: LTG: Patient will increase speech intelligibility at word/phrase/conversation level with cues, % of the time (SLP) Outcome: Completed/Met   Problem: RH Problem Solving Goal: LTG Patient will demonstrate problem solving for (SLP) Description: LTG:  Patient will demonstrate problem solving for basic/complex daily situations with cues  (SLP) Outcome: Completed/Met   Problem: RH Attention Goal: LTG Patient will demonstrate this level of attention during functional activites (SLP) Description: LTG:  Patient will will demonstrate this level of attention during functional activites (SLP) Outcome: Completed/Met   Problem: RH Memory Goal: LTG Patient will demonstrate ability for day to day (SLP) Description: LTG:   Patient will demonstrate ability for day to day recall/carryover during cognitive/linguistic activities with assist  (SLP) Outcome: Completed/Met

## 2022-10-20 NOTE — Progress Notes (Signed)
Patient ID: Elizabeth Stone, female   DOB: 01/13/1946, 77 y.o.   MRN: [id number]  Patient will discharge after 2PM tomorrow per son request.

## 2022-10-20 NOTE — Plan of Care (Signed)
  Problem: Sit to Stand Goal: LTG:  Patient will perform sit to stand with assistance level (PT) Description: LTG:  Patient will perform sit to stand with assistance level (PT) Outcome: Progressing Flowsheets (Taken 10/20/2022 1645) LTG: PT will perform sit to stand in preparation for functional mobility with assistance level: Supervision/Verbal cueing Slow to progress d/t Meniere Disease diagnosis present PTA.   Problem: RH Bed to Chair Transfers Goal: LTG Patient will perform bed/chair transfers w/assist (PT) Description: LTG: Patient will perform bed to chair transfers with assistance (PT). Outcome: Progressing Flowsheets (Taken 10/20/2022 1645) LTG: Pt will perform Bed to Chair Transfers with assistance level: Supervision/Verbal cueing Slow to progress d/t Meniere Disease diagnosis present PTA.   Problem: RH Balance Goal: LTG Patient will maintain dynamic sitting balance (PT) Description: LTG:  Patient will maintain dynamic sitting balance with assistance during mobility activities (PT) Outcome: Completed/Met Flowsheets (Taken 10/13/2022 1842 by Tawana Scale, PT) LTG: Pt will maintain dynamic sitting balance during mobility activities with:: Independent with assistive device  Goal: LTG Patient will maintain dynamic standing balance (PT) Description: LTG:  Patient will maintain dynamic standing balance with assistance during mobility activities (PT) Outcome: Completed/Met Flowsheets (Taken 10/13/2022 1842 by Tawana Scale, PT) LTG: Pt will maintain dynamic standing balance during mobility activities with:: Supervision/Verbal cueing   Problem: RH Bed Mobility Goal: LTG Patient will perform bed mobility with assist (PT) Description: LTG: Patient will perform bed mobility with assistance, with/without cues (PT). Outcome: Completed/Met Flowsheets (Taken 10/13/2022 1842 by Tawana Scale, PT) LTG: Pt will perform bed mobility with assistance level of: Independent with assistive  device    Problem: RH Car Transfers Goal: LTG Patient will perform car transfers with assist (PT) Description: LTG: Patient will perform car transfers with assistance (PT). Outcome: Completed/Met Flowsheets (Taken 10/13/2022 1842 by Tawana Scale, PT) LTG: Pt will perform car transfers with assist:: Supervision/Verbal cueing   Problem: RH Ambulation Goal: LTG Patient will ambulate in controlled environment (PT) Description: LTG: Patient will ambulate in a controlled environment, # of feet with assistance (PT). Outcome: Completed/Met Flowsheets Taken 10/20/2022 1645 by Judieth Keens A, PT LTG: Ambulation distance in controlled environment: 129f using RW Taken 10/13/2022 1842 by PTawana Scale PT LTG: Pt will ambulate in controlled environ  assist needed:: Supervision/Verbal cueing Goal: LTG Patient will ambulate in home environment (PT) Description: LTG: Patient will ambulate in home environment, # of feet with assistance (PT). Outcome: Completed/Met Flowsheets (Taken 10/13/2022 1842 by PTawana Scale PT) LTG: Pt will ambulate in home environ  assist needed:: Supervision/Verbal cueing LTG: Ambulation distance in home environment: 574fusing LRAD   Problem: RH Stairs Goal: LTG Patient will ambulate up and down stairs w/assist (PT) Description: LTG: Patient will ambulate up and down # of stairs with assistance (PT) Outcome: Completed/Met Flowsheets (Taken 10/13/2022 1842 by PiTawana ScalePT) LTG: Pt will ambulate up/down stairs assist needed:: Supervision/Verbal cueing LTG: Pt will  ambulate up and down number of stairs: 8 steps using 1 HR per home set-up

## 2022-10-20 NOTE — Progress Notes (Signed)
Occupational Therapy Session Note  Patient Details  Name: Elizabeth Stone MRN: [id number] Date of Birth: [birth year]  Today's Date: 10/20/2022 OT Individual Time: 0900-1017 session 1 OT Individual Time Calculation (min): 77 min  Session 2: [id number] ( 16 mins missed d/t back pain)    Short Term Goals: Week 1:  OT Short Term Goal 1 (Week 1): STGS = LTGs  Skilled Therapeutic Interventions/Progress Updates:  Session 1: Pt greeted supine in bed, pt agreeable to OT intervention. Session focus on BADL reeducation, functional mobility, dynamic standing balance and decreasing overall caregiver burden.            Pt completed supine>sit MODI, pt completed functional ambulation in room with Rw and close supervision to retrieve clothes out of closet using walker bag to transport items as needed. Pt initially declined using shower seat however retrieved small seat and pt agreeable. Pt ambulated into shower with close CGA with no AD. Pt completed bathing MODI from shower seat. Of note pt was not aware shower was flooding d/t hearing impairments ( could hear water pooling up in shower) needing cues for safety awareness during bathing tasks. However later, pt reports " I did notice that the shower was flooding" either way recommended that pt always have someone with her when showering d/t impaired safety awareness from hearing loss.   Pt also noted to lean back into shower seat when standing to wash buttock, no true LOB but close CGA provided. Pt also demonstrates an audible increased work of breathing during bathing seeming like pt is severely out of breath however pt denies SOB. Pt reports "that's just how I breathe."   Pt exited shower with RW and supervision. Pt completed dressing EOB with MODI. Pt completed standing grooming tasks at sink MODI.   Issued pt handout of recommended shower seat for purchase, pt didn't want [place] to order her one as she is not sure it will fit and wants to get home first and have her  son measure the shower.             Ended session with pt supine in bed with all needs within reach and bed alarm activated.                   Session 2: pt greeted supine in bed reporting back pain, pt declined OOB session. Pt was agreeable to complete BIMS assessment as indicated below:   Cognition Cognition Overall Cognitive Status: Within Functional Limits for tasks assessed Arousal/Alertness: Awake/alert Orientation Level: Person;Place;Situation Person: Oriented Place: Oriented Situation: Oriented Memory: Impaired Memory Impairment: Decreased short term memory Decreased Short Term Memory: Functional complex;Verbal complex Attention: Focused;Sustained Focused Attention: Appears intact Sustained Attention: Appears intact Selective Attention: Impaired Selective Attention Impairment: Functional complex Awareness: Appears intact Problem Solving: Impaired Problem Solving Impairment: Verbal complex;Functional complex Behaviors: Impulsive Safety/Judgment: Impaired Brief Interview for Mental Status (BIMS) Repetition of Three Words (First Attempt): 3 Temporal Orientation: Year: Correct Temporal Orientation: Month: Accurate within 5 days Temporal Orientation: Day: Correct Recall: "Sock": Yes, no cue required Recall: "Blue": Yes, no cue required Recall: "Bed": Yes, no cue required BIMS Summary Score: 15   Did go over OT goals and below education:  - always having someone present for showers - compensatory methods for IADLs such as laundry - kitchen safety/ fall prevention - recommendation to stay away from heavy cleaning such as vacuuming - general fall prevention education  Ended session with pt supine in bed with, ice pack applied for pain and  all needs within reach.   Therapy Documentation Precautions:  Precautions Precautions: Fall Precaution Comments: h/o falls, Menierre's Disease s/p cochlear implant Restrictions Weight Bearing Restrictions: No     Pain: Session 1: No pain reported during session  Session 2: unrated pain reported in back, pain meds given and ice pack applied.     Therapy/Group: Individual Therapy  Kellye Miz[facility] 10/20/2022, 11:56 AM

## 2022-10-20 NOTE — Progress Notes (Addendum)
Physical Therapy Discharge Summary  Patient Details  Name: Elizabeth Stone MRN: [id number] Date of Birth: 31-Aug-1946  Date of Discharge from PT service:October 20, 2022  Today's Date: 10/20/2022 PT Individual Time: 1303-1402 PT Individual Time Calculation (min): 59 min    Patient has met 7 of 9 long term goals due to improved activity tolerance, improved balance, improved postural control, increased strength, improved attention, improved awareness, and improved coordination.  Patient to discharge at an ambulatory level Supervision.   Patient's care partner is independent to provide the necessary physical assistance at discharge. Son relates providing outside agency for 24/7 supervision.   Reasons goals not met: Pt did not meet Mod I goals for sit<>stand and stand pivot transfers d/t continuing balance impairments that existed PTA d/t Meniere Disease diagnosis.  Recommendation:  Patient will benefit from ongoing skilled PT services in  home health and then outpatient therapies, especially with a more neuro focus for balance impairments  to continue to advance safe functional mobility, address ongoing impairments in strength, coordination, balance, activity tolerance, cognition, safety awareness, and to minimize fall risk.  Equipment: No equipment provided  Reasons for discharge: treatment goals met and discharge from hospital  Patient/family agrees with progress made and goals achieved: Yes  PT Discharge Precautions/Restrictions Precautions Precautions: Fall Precaution Comments: h/o falls, Menierre's Disease s/p cochlear implant Restrictions Weight Bearing Restrictions: No Vital Signs Therapy Vitals Temp: 98 F (36.7 C) Temp Source: Oral Pulse Rate: 83 Resp: 20 BP: (!) 162/59 Patient Position (if appropriate): Sitting Oxygen Therapy SpO2: 97 % O2 Device: Room Air Pain Pain Assessment Pain Scale: 0-10 Pain Score: 3  Pain Type: Chronic pain Pain Location: Back Pain  Orientation: Lower Pain Descriptors / Indicators: Aching;Discomfort Pain Onset: On-going Pain Interference Pain Interference Pain Effect on Sleep: 3. Frequently Pain Interference with Therapy Activities: 2. Occasionally Pain Interference with Day-to-Day Activities: 3. Frequently;2. Occasionally Vision/Perception  Vision - History Ability to See in Adequate Light: 0 Adequate Perception Perception: Within Functional Limits Praxis Praxis: Intact  Cognition Overall Cognitive Status: Within Functional Limits for tasks assessed Arousal/Alertness: Awake/alert Orientation Level: Oriented X4 Attention: Focused;Sustained Focused Attention: Appears intact Sustained Attention: Appears intact Selective Attention: Impaired Selective Attention Impairment: Functional complex Memory: Impaired Memory Impairment: Decreased short term memory Decreased Short Term Memory: Functional complex;Verbal complex Awareness: Appears intact Problem Solving: Impaired Problem Solving Impairment: Verbal complex;Functional complex Behaviors: Impulsive Safety/Judgment: Impaired Sensation Sensation Light Touch: Impaired Detail Peripheral sensation comments: impaired distally with pt reporting feels numb in her feet - appears to be decreased throughout bilateral LEs Light Touch Impaired Details: Impaired RLE;Impaired LLE Coordination Coordination and Movement Description: GM movements impaired due to generalized weakness, impaired midline orientation, and overall impaired endurance Heel Shin Test: performs with increased effort bilaterally Motor  Motor Motor: Abnormal postural alignment and control;Other (comment) Motor - Discharge Observations: impaired due to generalized weakness and endurace, impaired midline orientation with inner ear balance impairment  Mobility Bed Mobility Bed Mobility: Supine to Sit;Sit to Supine Rolling Right: Independent with assistive device Supine to Sit: Independent with  assistive device Sit to Supine: Independent with assistive device Transfers Transfers: Sit to Stand;Stand Pivot Transfers;Stand to Sit Sit to Stand: Supervision/Verbal cueing Stand to Sit: Supervision/Verbal cueing Stand Pivot Transfers: Supervision/Verbal cueing Stand Pivot Transfer Details: Verbal cues for technique;Verbal cues for precautions/safety;Verbal cues for safe use of DME/AE Transfer (Assistive device): Rolling walker Locomotion  Gait Ambulation: Yes Gait Assistance: Supervision/Verbal cueing (close supervision) Gait Distance (Feet): 150 Feet (100-150' on average; 250' at most)  Assistive device: Rolling walker Gait Assistance Details: Verbal cues for technique;Verbal cues for precautions/safety;Verbal cues for safe use of DME/AE Gait Gait: Yes Gait Pattern: Impaired Gait Pattern: Step-through pattern;Decreased step length - left;Decreased step length - right;Narrow base of support (Bil toe out) Gait velocity: Decreased Stairs / Additional Locomotion Stairs: Yes Stairs Assistance: Supervision/Verbal cueing (close supervision/ SBA) Stair Management Technique: One rail Right;Step to pattern;Sideways Number of Stairs: 12 Height of Stairs: 6 Ramp: Supervision/Verbal cueing Curb: Supervision/Verbal cueing Wheelchair Mobility Wheelchair Mobility: No  Trunk/Postural Assessment  Cervical Assessment Cervical Assessment: Exceptions to [facility] (forward head) Thoracic Assessment Thoracic Assessment: Exceptions to [facility] (rounded shoulders) Lumbar Assessment Lumbar Assessment: Exceptions to [facility] (decreased lordosis with posterior pelvic tilt) Postural Control Postural Control: Deficits on evaluation (delayed reactionary balance) Protective Responses: delayed and inadequate in standing Postural Limitations: decreased in standing  Balance Balance Balance Assessed: Yes Standardized Balance Assessment Standardized Balance Assessment: Berg Balance Test Berg Balance Test Sit to Stand:  Able to stand  independently using hands Standing Unsupported: Able to stand 2 minutes with supervision Sitting with Back Unsupported but Feet Supported on Floor or Stool: Able to sit safely and securely 2 minutes Stand to Sit: Sits safely with minimal use of hands Transfers: Able to transfer safely, definite need of hands Standing Unsupported with Eyes Closed: Able to stand 10 seconds with supervision Standing Ubsupported with Feet Together: Able to place feet together independently and stand for 1 minute with supervision From Standing, Reach Forward with Outstretched Arm: Can reach forward >12 cm safely (5") From Standing Position, Pick up Object from Floor: Able to pick up shoe, needs supervision From Standing Position, Turn to Look Behind Over each Shoulder: Turn sideways only but maintains balance Turn 360 Degrees: Needs close supervision or verbal cueing Standing Unsupported, Alternately Place Feet on Step/Stool: Able to complete >2 steps/needs minimal assist Standing Unsupported, One Foot in Front: Needs help to step but can hold 15 seconds Standing on One Leg: Unable to try or needs assist to prevent fall Total Score: 34 Static Sitting Balance Static Sitting - Balance Support: Feet supported Static Sitting - Level of Assistance: 6: Modified independent (Device/Increase time) Dynamic Sitting Balance Dynamic Sitting - Balance Support: Feet supported Dynamic Sitting - Level of Assistance: 6: Modified independent (Device/Increase time) Dynamic Sitting - Balance Activities: Reaching for objects;Reaching across midline Static Standing Balance Static Standing - Balance Support: During functional activity;Bilateral upper extremity supported Static Standing - Level of Assistance: 6: Modified independent (Device/Increase time) Dynamic Standing Balance Dynamic Standing - Balance Support: During functional activity Dynamic Standing - Level of Assistance: 5: Stand by assistance Dynamic  Standing - Balance Activities: Forward lean/weight shifting;Reaching for objects;Reaching across midline Extremity Assessment      RLE Assessment RLE Assessment: Exceptions to [facility] RLE Strength Right Hip Flexion: 4-/5 Right Knee Flexion: 4-/5 Right Knee Extension: 4/5 Right Ankle Dorsiflexion: 4/5 Right Ankle Plantar Flexion: 4/5 LLE Assessment LLE Assessment: Exceptions to [facility] Active Range of Motion (AROM) Comments: WFL/WNL General Strength Comments: assessed in sitting LLE Strength Left Hip Flexion: 4/5 Left Knee Flexion: 4/5 Left Knee Extension: 4+/5 Left Ankle Dorsiflexion: 4/5 Left Ankle Plantar Flexion: 4/5  Skilled Interventions: Patient supine in bed on entrance to room. Patient alert and agreeable to PT session.   Patient with no pain complaint at start of session.  Therapeutic Activity: Bed Mobility: Pt performed supine <> sit with Mod I. No cueing for technique Transfers: Pt performed sit-->stand and continues to demonstrate inability to attain upright standing balance  without Bil knee extensor push into EOB to catch self from travelling backwards. Is able to maintain with forward weight shift of head and shoulders to gain standing balance. CGA provided. From w/c and other seat heights throughout gym spaces, pt is able to push up from armrests and attain stance with supervision. Stand pivot transfers performed with supervision as long as pt has RW. With no AD requires CGA.   Pt demos how she folds RW to laterally step through narrower spaces such as in a crowded restaurant and does not pay attention to placement of RW and restricts ability to step sideways with feet tangled in RW wheels and support legs. Demonstrated to pt how to maintain bottom of walker out and handles closer to her body with potential need to pick up and replace with lateral steps instead of rolling the walker. Pt demos understanding.  Gait Training:  Pt ambulated 155' x1/ 120' x1, 30' x6 using RW with  close supervision. Demonstrated ability to walk from car to steps at distance close to environment at home. Provided vc/ tc for stepping into BOS of RW and maintaining proximity.   Neuromuscular Re-ed: NMR facilitated during session with focus on standing balance. Pt guided in BERG Balance testing. Patient demonstrates increased fall risk as noted by score of  34 /56 on Berg Balance Scale.  (<36= high risk for falls, close to 100%; 37-45 significant >80%; 46-51 moderate >50%; 52-55 lower >25%) Pt has demonstrated a 13 point increase in score from one week ago and significant improvement. Is able to complete most static balance items on test without AD, some require setup using RW and the rest/ more dynamic items require AD use throughout.  NMR performed for improvements in motor control and coordination, balance, sequencing, judgement, and self confidence/ efficacy in performing all aspects of mobility at highest level of independence.   At end of testing, noted blood running down pt's ankle and to the floor. Pt has a small skin tear over a previous scar/ scab that pt relates is slow to heal. RN notified for hygiene and dressing with foam bandage.   Patient supine in bed at end of session with brakes locked, bed alarm set, and all needs within reach.   Alger Simons 10/20/2022, 4:44 PM

## 2022-10-20 NOTE — Progress Notes (Signed)
Physical Therapy Session Note  Patient Details  Name: Elizabeth Stone MRN: [id number] Date of Birth: May 06, 1946  Today's Date: 10/20/2022 PT Individual Time: 1118-1202 PT Individual Time Calculation (min): 44 min   Short Term Goals: Week 1:  PT Short Term Goal 1 (Week 1): = to LTGs based on ELOS  Skilled Therapeutic Interventions/Progress Updates:  Patient supine in bed on entrance to room. Patient alert and agreeable to PT session.   Patient with no pain complaint at start of session.  Therapeutic Activity: Bed Mobility: Pt performed supine <> sit with Mod I. No cueing required for technique. Transfers: Pt performed sit<>stand and stand pivot transfers throughout session with supervision except for rise from EOB which she attempts x2. Performs with inability to shift weight forward with weight over heels and pt standing with BLE extended into side of bed and toes in air until she is able to forward weight shift. Provided verbal cues for increasing forward lean and improving BLE positioning prior to attempt.  Car transfer with supervision and pt requiring BUE support on door frame in order to step L foot into footwell and then sitting on seat. Educated on increased safety with sit to seat first, then bringing BLE in to [place] of car one at a time with pivot to face forward. Pt relates understanding in increased safety with technique.   Gait Training/ Neuromuscular Re-ed:  Pt ambulated 160 ft using RW with close supervision for balance. W/c follow for fatigue. Demonstrated increased respiratory rate with increase in distance. Provided vc/ tc for safe technique with AD. Pt also ambulates up/ down ramp with close supervision and vc for controlling speed down and increasing step height up.   Continued stair training with pt able to ascend/ descend 8 steps then 4 steps following rest break using RHR and sideways step technique. Is also able to use L HR then R HR to step up/ down 8" step as pt's  first step is taller than others.   Is also able to demo correct technique for 8" step with RW for support x2.   NMR performed for improvements in motor control and coordination, balance, sequencing, judgement, and self confidence/ efficacy in performing all aspects of mobility at highest level of independence.   Patient supine in bed at end of session with brakes locked, bed alarm set, and all needs within reach.  Pt does not appear to be able to meet Mod I goals although she lives alone. Will require some close supervision - especially with transfers for balance/ safety.   Recommended to attend neuro-based OPPT at [place] in order to continue to work on balance and safety.   Therapy Documentation Precautions:  Precautions Precautions: Fall Precaution Comments: h/o falls, Menierre's Disease s/p cochlear implant Restrictions Weight Bearing Restrictions: No General:   Vital Signs: Therapy Vitals Temp: 98 F (36.7 C) Temp Source: Oral Pulse Rate: 66 Resp: 16 BP: 136/62 Patient Position (if appropriate): Lying Oxygen Therapy SpO2: 94 % O2 Device: Room Air Pain:  Low back pain related at end of session and improves with supine positioning.   Therapy/Group: Individual Therapy  Alger Simons PT, DPT, CSRS 10/20/2022, 7:56 AM

## 2022-10-20 NOTE — Progress Notes (Signed)
PROGRESS NOTE   Subjective/Complaints:  Pt wants rx called [facility], does not want delivery  ROS:  Pt denies SOB, abd pain, CP, N/V/C/D, and vision changes    Objective:   No results found. No results for input(s): "WBC", "HGB", "HCT", "PLT" in the last 72 hours.  No results for input(s): "NA", "K", "CL", "CO2", "GLUCOSE", "BUN", "CREATININE", "CALCIUM" in the last 72 hours.   Intake/Output Summary (Last 24 hours) at 10/20/2022 0726 Last data filed at 10/19/2022 0925 Gross per 24 hour  Intake 240 ml  Output --  Net 240 ml         Physical Exam: Vital Signs Blood pressure 136/62, pulse 66, temperature 98 F (36.7 C), temperature source Oral, resp. rate 16, SpO2 94 %. Gen: no distress, normal appearing, BMI 31.99    General: No acute distress Mood and affect are appropriate Heart: Regular rate and rhythm no rubs murmurs or extra sounds Lungs: Clear to auscultation, breathing unlabored, no rales or wheezes Abdomen: Positive bowel sounds, soft nontender to palpation, nondistended Extremities: No clubbing, cyanosis, or edema   Neurological: Very HOH- a cochlear implant on R side- put on cochlear aid  Neuro: Alert and oriented x3   Musculoskeletal: 5/5 strength BUE and BLE      Assessment/Plan: 1. Functional deficits which require 3+ hours per day of interdisciplinary therapy in a comprehensive inpatient rehab setting. Physiatrist is providing close team supervision and 24 hour management of active medical problems listed below. Physiatrist and rehab team continue to assess barriers to discharge/monitor patient progress toward functional and medical goals  Care Tool:  Bathing    Body parts bathed by patient: Right arm, Left arm, Chest, Abdomen, Front perineal area, Buttocks, Right upper leg, Left upper leg, Right lower leg, Left lower leg, Face         Bathing assist Assist Level:  Contact Guard/Touching assist     Upper Body Dressing/Undressing Upper body dressing   What is the patient wearing?: Bra, Pull over shirt    Upper body assist Assist Level: Minimal Assistance - Patient > 75%    Lower Body Dressing/Undressing Lower body dressing      What is the patient wearing?: Underwear/pull up, Pants     Lower body assist Assist for lower body dressing: Contact Guard/Touching assist     Toileting Toileting Toileting Activity did not occur (Clothing management and hygiene only): N/A (no void or bm)  Toileting assist Assist for toileting: Supervision/Verbal cueing     Transfers Chair/bed transfer  Transfers assist     Chair/bed transfer assist level: Contact Guard/Touching assist     Locomotion Ambulation   Ambulation assist      Assist level: Minimal Assistance - Patient > 75% Assistive device: Walker-rolling Max distance: 65   Walk 10 feet activity   Assist     Assist level: Minimal Assistance - Patient > 75% Assistive device: Walker-rolling   Walk 50 feet activity   Assist Walk 50 feet with 2 turns activity did not occur: Safety/medical concerns  Assist level: Minimal Assistance - Patient > 75% Assistive device: Walker-rolling    Walk 150 feet activity   Assist  Walk 150 feet activity did not occur: Safety/medical concerns         Walk 10 feet on uneven surface  activity   Assist Walk 10 feet on uneven surfaces activity did not occur: Safety/medical concerns   Assist level: Minimal Assistance - Patient > 75% (ramp) Assistive device: Walker-rolling   Wheelchair     Assist Is the patient using a wheelchair?: Yes (pt with limited walking distance prior to hospitalization but did not require use of w/c for her functional mobility in the home - now using for transport to/from gym as this is further than pt walks at baseline) Type of Wheelchair: Manual    Wheelchair assist level: Dependent - Patient 0%       Wheelchair 50 feet with 2 turns activity    Assist        Assist Level: Dependent - Patient 0%   Wheelchair 150 feet activity     Assist      Assist Level: Dependent - Patient 0%   Blood pressure 136/62, pulse 66, temperature 98 F (36.7 C), temperature source Oral, resp. rate 16, SpO2 94 %.  Medical Problem List and Plan: 1. Functional deficits secondary to stroke like symptoms s/p TNK             -patient may shower             -ELOS/Goals: 10/21/22             Continue CIR  Pt asking about therapy in [place] after d/c. - [facility] Re[facility] site Pt wild/c at mod I level , does not need PMR f/u  2.  Antithrombotics: -DVT/anticoagulation:  Mechanical: Sequential compression devices, entire leg Bilateral lower extremities             -antiplatelet therapy: aspirin '81mg'$  daily 3. Neuropathy: continue cymbalta '60mg'$  BID.   12/30- Back pain- on scheduled tylenol- sometimes took Norco at home per chart- con't regimen- might benefit from Lidoderm patch.  4. Anxiety: decrease klonopin to '1mg'$  HS 5. Neuropsych/cognition: This patient is capable of making decisions on her own behalf. 6. HTN: continue cozaar '100mg'$  daily, HCTZ '25mg'$  daily, and Norvasc '5mg'$  dauly. Magnesium gluconate '250mg'$  ordered HS  12/30- BP very slightly elevated at 536 systlic- con't regimen Vitals:   10/19/22 2003 10/20/22 0424  BP: (!) 172/79 136/62  Pulse: 90 66  Resp: 20 16  Temp: 98.1 F (36.7 C) 98 F (36.7 C)  SpO2: 98% 94%    7. HLD: atorvastain increased to '20mg'$  as LDL is 77 and goal is <70 8. Hearing impaired: has cochlear implant in right  ear- needs to have hearing aid in for communication , pt also reads lips 9. 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 Chronic pain hx of multiple joint replacements (bilateral shoulders and knees) Fibromyalgia:will schedule tylenol , also on dilaudid, which she is taking only  ~1 x per day   12/30- might try Lidoderm patches for back pain as well? 12. Fatigue: add vitamin B and B supplements 13. Suboptimal vitamin D: start ergocalciferol 50,000U once per week for 7 weeks 14. Blurry vision, discussed and has been stable 15. Hyponatremia: stable at 131    Latest Ref Rng & Units 10/16/2022    5:43 AM 10/15/2022    5:41 AM 10/14/2022    6:02 AM  BMP  Glucose 70 - 99 mg/dL 119  119  127   BUN 8 -  23 mg/dL '11  9  11   '$ Creatinine 0.44 - 1.00 mg/dL 0.67  0.66  0.62   Sodium 135 - 145 mmol/L 131  131  127   Potassium 3.5 - 5.1 mmol/L 3.7  3.7  3.6   Chloride 98 - 111 mmol/L 96  96  93   CO2 22 - 32 mmol/L '26  26  24   '$ Calcium 8.9 - 10.3 mg/dL 9.7  9.7  9.5     16. Low back pain: kpad ordered, will schedule tylenol    LOS: 9 days A FACE TO FACE EVALUATION WAS PERFORMED  Charlett Blake 10/20/2022, 7:26 AM

## 2022-10-21 ENCOUNTER — Other Ambulatory Visit (HOSPITAL_COMMUNITY): Payer: Self-pay

## 2022-10-21 DIAGNOSIS — R2990 Unspecified symptoms and signs involving the nervous system: Secondary | ICD-10-CM

## 2022-10-21 LAB — GLUCOSE, CAPILLARY
Glucose-Capillary: 99 mg/dL (ref 70–99)
Glucose-Capillary: 226 mg/dL — ABNORMAL HIGH (ref 70–99)

## 2022-10-21 NOTE — Progress Notes (Signed)
Patient discharge to home per wheelchair accompanied by NT and son. Discharge instructions done by Linna Hoff PA through phone call. RN followed up with patient's son - No questions noted.

## 2022-10-21 NOTE — Progress Notes (Signed)
PROGRESS NOTE   Subjective/Complaints:  Pt feels ready for discharge wants to get back injection after discharge,   ROS:  Pt denies SOB, abd pain, CP, N/V/C/D, and vision changes    Objective:   No results found. No results for input(s): "WBC", "HGB", "HCT", "PLT" in the last 72 hours.  No results for input(s): "NA", "K", "CL", "CO2", "GLUCOSE", "BUN", "CREATININE", "CALCIUM" in the last 72 hours.   Intake/Output Summary (Last 24 hours) at 10/21/2022 0817 Last data filed at 10/20/2022 1700 Gross per 24 hour  Intake 480 ml  Output --  Net 480 ml         Physical Exam: Vital Signs Blood pressure (!) 108/51, pulse 66, temperature 98.2 F (36.8 C), temperature source Oral, resp. rate 17, height '5\' 1"'$  (1.549 m), SpO2 94 %. Gen: no distress, normal appearing, BMI 31.99    General: No acute distress Mood and affect are appropriate Heart: Regular rate and rhythm no rubs murmurs or extra sounds Lungs: Clear to auscultation, breathing unlabored, no rales or wheezes Abdomen: Positive bowel sounds, soft nontender to palpation, nondistended Extremities: No clubbing, cyanosis, or edema   Neurological: Very HOH- a cochlear implant on R side- put on cochlear aid  Neuro: Alert and oriented x3   Musculoskeletal: 5/5 strength BUE and BLE      Assessment/Plan: 1. Functional deficits which require 3+ hours per day of interdisciplinary therapy in a comprehensive inpatient rehab setting. Physiatrist is providing close team supervision and 24 hour management of active medical problems listed below. Physiatrist and rehab team continue to assess barriers to discharge/monitor patient progress toward functional and medical goals  Care Tool:  Bathing    Body parts bathed by patient: Right arm, Left arm, Chest, Abdomen, Front perineal area, Buttocks, Right upper leg, Left upper leg, Right lower leg, Left lower leg, Face          Bathing assist Assist Level: Independent with assistive device     Upper Body Dressing/Undressing Upper body dressing   What is the patient wearing?: Bra, Pull over shirt    Upper body assist Assist Level: Independent with assistive device    Lower Body Dressing/Undressing Lower body dressing      What is the patient wearing?: Underwear/pull up, Pants     Lower body assist Assist for lower body dressing: Independent with assitive device     Toileting Toileting Toileting Activity did not occur (Clothing management and hygiene only): N/A (no void or bm)  Toileting assist Assist for toileting: Independent with assistive device     Transfers Chair/bed transfer  Transfers assist     Chair/bed transfer assist level: Supervision/Verbal cueing     Locomotion Ambulation   Ambulation assist      Assist level: Minimal Assistance - Patient > 75% Assistive device: Walker-rolling Max distance: 65   Walk 10 feet activity   Assist     Assist level: Minimal Assistance - Patient > 75% Assistive device: Walker-rolling   Walk 50 feet activity   Assist Walk 50 feet with 2 turns activity did not occur: Safety/medical concerns  Assist level: Minimal Assistance - Patient > 75% Assistive device: Walker-rolling  Walk 150 feet activity   Assist Walk 150 feet activity did not occur: Safety/medical concerns         Walk 10 feet on uneven surface  activity   Assist Walk 10 feet on uneven surfaces activity did not occur: Safety/medical concerns   Assist level: Minimal Assistance - Patient > 75% (ramp) Assistive device: Walker-rolling   Wheelchair     Assist Is the patient using a wheelchair?: Yes (pt with limited walking distance prior to hospitalization but did not require use of w/c for her functional mobility in the home - now using for transport to/from gym as this is further than pt walks at baseline) Type of Wheelchair: Manual    Wheelchair  assist level: Dependent - Patient 0%      Wheelchair 50 feet with 2 turns activity    Assist        Assist Level: Dependent - Patient 0%   Wheelchair 150 feet activity     Assist      Assist Level: Dependent - Patient 0%   Blood pressure (!) 108/51, pulse 66, temperature 98.2 F (36.8 C), temperature source Oral, resp. rate 17, height '5\' 1"'$  (1.549 m), SpO2 94 %.  Medical Problem List and Plan: 1. Functional deficits secondary to stroke like symptoms s/p TNK             -patient may shower             -ELOS/Goals: 10/21/22             Continue CIR  Pt asking about therapy in [place] after d/c. - [facility] site Pt will d/c at mod I level , does not need PMR f/u  2.  Antithrombotics: -DVT/anticoagulation:  Mechanical: Sequential compression devices, entire leg Bilateral lower extremities             -antiplatelet therapy: aspirin '81mg'$  daily- pt planning to have epidural injection ? Post discharge, discussed that neuro would have to ok coming off the ASA 3. Neuropathy: continue cymbalta '60mg'$  BID.   12/30- Back pain- on scheduled tylenol- sometimes took Norco at home per chart- f/u with pain MD can give 30 tab Norco upon discharge  4. Anxiety: decrease klonopin to '1mg'$  HS 5. Neuropsych/cognition: This patient is capable of making decisions on her own behalf. 6. HTN: continue cozaar '100mg'$  daily, HCTZ '25mg'$  daily, and Norvasc '5mg'$  dauly. Magnesium gluconate '250mg'$  ordered HS  12/30- BP very slightly elevated at 938 systlic- con't regimen Vitals:   10/20/22 2008 10/21/22 0445  BP: (!) 159/72 (!) 108/51  Pulse: 79 66  Resp: 18 17  Temp: 98.3 F (36.8 C) 98.2 F (36.8 C)  SpO2: 100% 94%    7. HLD: atorvastain increased to '20mg'$  as LDL is 77 and goal is <70 8. Hearing impaired: has cochlear implant in right  ear- needs to have hearing aid in for communication , pt also reads lips 9. Obesity: BMI 31.99: provide dietary education 10. Menieres  disease: patient has cochlear inplant 11. Chronic pain hx of multiple joint replacements (bilateral shoulders and knees) Fibromyalgia:will schedule tylenol , also on dilaudid, which she is taking only ~1 x per day   12/30- might try Lidoderm patches for back pain as well? 12. Fatigue: add vitamin B and B supplements 13. Suboptimal vitamin D: start ergocalciferol 50,000U once per week for 7 weeks 14. Blurry vision, discussed and has been stable 15. Hyponatremia: stable at 131    Latest Ref  Rng & Units 10/16/2022    5:43 AM 10/15/2022    5:41 AM 10/14/2022    6:02 AM  BMP  Glucose 70 - 99 mg/dL 119  119  127   BUN 8 - 23 mg/dL '11  9  11   '$ Creatinine 0.44 - 1.00 mg/dL 0.67  0.66  0.62   Sodium 135 - 145 mmol/L 131  131  127   Potassium 3.5 - 5.1 mmol/L 3.7  3.7  3.6   Chloride 98 - 111 mmol/L 96  96  93   CO2 22 - 32 mmol/L '26  26  24   '$ Calcium 8.9 - 10.3 mg/dL 9.7  9.7  9.5     16. Low back pain: kpad ordered, will schedule tylenol- follow up with her pain MD    LOS: 10 days A FACE TO FACE EVALUATION WAS PERFORMED  Charlett Blake 10/21/2022, 8:17 AM

## 2022-10-21 NOTE — Plan of Care (Signed)
Problem: RH Eating Goal: LTG Patient will perform eating w/assist, cues/equip (OT) Description: LTG: Patient will perform eating with assist, with/without cues using equipment (OT) Outcome: Completed/Met   Problem: RH Grooming Goal: LTG Patient will perform grooming w/assist,cues/equip (OT) Description: LTG: Patient will perform grooming with assist, with/without cues using equipment (OT) Outcome: Completed/Met   Problem: RH Functional Use of Upper Extremity Goal: LTG Patient will use RT/LT upper extremity as a (OT) Description: LTG: Patient will use right/left upper extremity as a stabilizer/gross assist/diminished/nondominant/dominant level with assist, with/without cues during functional activity (OT) Outcome: Completed/Met   Problem: RH Full Meal Prep Goal: LTG Patient will perform full meal prep w/assist (OT) Description: LTG: Patient will perform full meal prep with assistance, with/without cues (OT). Outcome: Completed/Met   Problem: RH Toilet Transfers Goal: LTG Patient will perform toilet transfers w/assist (OT) Description: LTG: Patient will perform toilet transfers with assist, with/without cues using equipment (OT) Outcome: Completed/Met   Problem: Sit to Stand Goal: LTG:  Patient will perform sit to stand in prep for activites of daily living with assistance level (OT) Description: LTG:  Patient will perform sit to stand in prep for activites of daily living with assistance level (OT) Outcome: Completed/Met   Problem: RH Dressing Goal: LTG Patient will perform upper body dressing (OT) Description: LTG Patient will perform upper body dressing with assist, with/without cues (OT). Outcome: Completed/Met Goal: LTG Patient will perform lower body dressing w/assist (OT) Description: LTG: Patient will perform lower body dressing with assist, with/without cues in positioning using equipment (OT) Outcome: Completed/Met   Problem: RH Toileting Goal: LTG Patient will  perform toileting task (3/3 steps) with assistance level (OT) Description: LTG: Patient will perform toileting task (3/3 steps) with assistance level (OT)  Outcome: Completed/Met   Problem: RH Simple Meal Prep Goal: LTG Patient will perform simple meal prep w/assist (OT) Description: LTG: Patient will perform simple meal prep with assistance, with/without cues (OT). Outcome: Completed/Met   Problem: RH Laundry Goal: LTG Patient will perform laundry w/assist, cues (OT) Description: LTG: Patient will perform laundry with assistance, with/without cues (OT). Outcome: Completed/Met   Problem: RH Light Housekeeping Goal: LTG Patient will perform light housekeeping w/assist (OT) Description: LTG: Patient will perform light housekeeping with assistance, with/without cues (OT). Outcome: Completed/Met   Problem: RH Toilet Transfers Goal: LTG Patient will perform toilet transfers w/assist (OT) Description: LTG: Patient will perform toilet transfers with assist, with/without cues using equipment (OT) Outcome: Completed/Met   Problem: RH Memory Goal: LTG Patient will demonstrate ability for day to day recall/carry over during activities of daily living with assistance level (OT) Description: LTG:  Patient will demonstrate ability for day to day recall/carry over during activities of daily living with assistance level (OT). Outcome: Completed/Met   Problem: RH Attention Goal: LTG Patient will demonstrate this level of attention during functional activites (OT) Description: LTG:  Patient will demonstrate this level of attention during functional activites  (OT) Outcome: Completed/Met   Problem: RH Awareness Goal: LTG: Patient will demonstrate awareness during functional activites type of (OT) Description: LTG: Patient will demonstrate awareness during functional activites type of (OT) Outcome: Completed/Met   Problem: RH Balance Goal: LTG Patient will maintain dynamic standing with ADLs  (OT) Description: LTG:  Patient will maintain dynamic standing balance with assist during activities of daily living (OT)  Outcome: Not Met (add Reason) Note: Pt continues to need occasional CGA.   Problem: RH Bathing Goal: LTG Patient will bathe all body parts with assist levels (OT)   Description: LTG: Patient will bathe all body parts with assist levels (OT) Outcome: Not Met (add Reason) Note: Pt needs supervision due to decreased balance and safety awareness with use of bath mats/water on the floor.   Problem: RH Tub/Shower Transfers Goal: LTG Patient will perform tub/shower transfers w/assist (OT) Description: LTG: Patient will perform tub/shower transfers with assist, with/without cues using equipment (OT) Outcome: Not Met (add Reason) Note: Pt needs supervision due to decreased balance and safety awareness with use of bath mats/water on the floor.   Problem: RH Balance Goal: LTG Patient will maintain dynamic standing with ADLs (OT) Description: LTG:  Patient will maintain dynamic standing balance with assist during activities of daily living (OT)  Outcome: Not Met (add Reason)   Problem: RH Tub/Shower Transfers Goal: LTG Patient will perform tub/shower transfers w/assist (OT) Description: LTG: Patient will perform tub/shower transfers with assist, with/without cues using equipment (OT) Outcome: Not Met (add Reason) Note: Pt needs supervision due to decreased balance and safety awareness with use of bath mats/water on the floor.   Problem: RH Balance Goal: LTG Patient will maintain dynamic standing with ADLs (OT) Description: LTG:  Patient will maintain dynamic standing balance with assist during activities of daily living (OT)  Outcome: Not Met (add Reason)

## 2022-10-21 NOTE — Progress Notes (Signed)
Inpatient Rehabilitation Discharge Medication Review by a Pharmacist  A complete drug regimen review was completed for this patient to identify any potential clinically significant medication issues.  High Risk Drug Classes Is patient taking? Indication by Medication  Antipsychotic Yes Lamictal - bipolar  Anticoagulant No   Antibiotic No   Opioid Yes Vicodin - pain  Antiplatelet Yes ASA - CVA  Hypoglycemics/insulin Yes Metformin-pre diabetes  Vasoactive Medication Yes Amlodipine/HCTZ/losartan - HTN  Chemotherapy No   Other Yes Atorvastatin - HLD Buproprion/Cymbalta - depression Clonazapam - anixiety Magnesium supplement- HTN Protonix - GERD Vitamin D- supplement for suboptimal Vit D level. Singulair- asthma Tylenol-pain     Type of Medication Issue Identified Description of Issue Recommendation(s)  Drug Interaction(s) (clinically significant)     Duplicate Therapy     Allergy     No Medication Administration End Date     Incorrect Dose     Additional Drug Therapy Needed     Significant med changes from prior encounter (inform family/care partners about these prior to discharge).    Other        Clinically significant medication issues were identified that warrant physician communication and completion of prescribed/recommended actions by midnight of the next day:  No  Name of provider notified for urgent issues identified:   Provider Method of Notification:     Pharmacist comments:   Time spent performing this drug regimen review (minutes):  Tar Heel, RPh Clinical Pharmacist 10/21/2022 9:04 AM

## 2022-11-03 ENCOUNTER — Telehealth: Payer: Self-pay | Admitting: Registered Nurse

## 2022-11-03 ENCOUNTER — Encounter: Payer: Medicare Other | Attending: Registered Nurse | Admitting: Registered Nurse

## 2022-11-03 ENCOUNTER — Encounter: Payer: Self-pay | Admitting: Registered Nurse

## 2022-11-03 VITALS — BP 138/81 | HR 97 | Ht 61.0 in

## 2022-11-03 DIAGNOSIS — E7849 Other hyperlipidemia: Secondary | ICD-10-CM | POA: Diagnosis not present

## 2022-11-03 DIAGNOSIS — I639 Cerebral infarction, unspecified: Secondary | ICD-10-CM

## 2022-11-03 DIAGNOSIS — K5909 Other constipation: Secondary | ICD-10-CM | POA: Insufficient documentation

## 2022-11-03 DIAGNOSIS — I10 Essential (primary) hypertension: Secondary | ICD-10-CM | POA: Diagnosis not present

## 2022-11-03 NOTE — Progress Notes (Signed)
Subjective:    Patient ID: Elizabeth Stone, female    DOB: Sep 11, 1946, 77 y.o.   MRN: [id number]  HPI: Elizabeth Stone is a 77 y.o. female who is here for [place] appointment for follow up of her  Cerebellar Stroke, Essential Hypertension, Hyperlipidemia and Chronic Constipation.  She was brought to [facility] on 10/07/2022 via EMS: Code Stroke H&P: [name] 63-year-old female with past medical history significant for cochlear implant, anxiety, depression, HLD, HTN, Mnire's disease, bipolar disorder who presents to the emergency department with blurred vision/double vision. She was brought to the emergency department as a code stroke activated by EMS. The patient's last known well was around 1030 this morning. On arrival, the patient's airway was intact and the patient was taken emergently to the [facility] for code stroke imaging with neurology bedside for evaluation.   CT Head WO Contrast:  IMPRESSION: 1. No acute intracranial abnormality. 2. Aspects is 10. 3. Extensive artifact from cochlear implant on the right. 4. Code stroke imaging results were communicated on 10/07/2022 at 12:27 pm to provider [name] via [name] text page  CTA:  IMPRESSION: 1. Negative for intracranial large vessel occlusion. 2. Atherosclerotic calcification right carotid bifurcation with estimated 55% diameter stenosis proximal right internal carotid artery. 3. Atherosclerotic calcification left carotid bifurcation without significant stenosis. 4. Both vertebral arteries patent to the basilar. 5. Aortic atherosclerosis.   Aortic Atherosclerosis (ICD10-I70.0).  Neurology Consulted:  Dr [name] Discharge Summary:  Stroke like symptoms s/p TNK Intermittent headache, blurry vision and double vision Code Stroke CT head No acute abnormality. Streak artifact from cochlear implant CTA head & neck No LVO Repeat head CT- no acute intracranial abnormality 2D Echo EF 65-70% LDL 77 HgbA1c 5.4 VTE prophylaxis - SCDs No  antithrombotics prior to admission, now on aspirin 81. Therapy recommendations:  CIR Disposition:  pending   Hypertension Home meds:  hydrochlorothiazide, Norvasc, Cozaar Stable On home BP meds including Norvasc 5, Cozaar 100, HCTZ 25 Long-term BP goal normotensive   Hyperlipidemia Home meds:  Lipitor '10mg'$  LDL 77, goal < 70 Increase atorvastatin to '20mg'$  Continue statin at discharge  [name] was admitted to inpatient rehabilitation on 10/11/2022 and discharged home on 10/21/2022. She states she hasn't heard for [facility], call was placed to [facility]. She states she has pain in her lower back. She rates her pain 8. She is waling with walker.    Pain Inventory Average Pain 8 Pain Right Now 8 My pain is sharp and aching  LOCATION OF PAIN  back  BOWEL Number of stools per week: 2 Oral laxative use Yes  Type of laxative senakot   BLADDER Pads   Mobility walk with assistance use a walker ability to climb steps?  yes do you drive?  no Do you have any goals in this area?  yes  Function retired I need assistance with the following:  household duties and shopping  Neuro/Psych weakness numbness trouble walking depression  Prior Studies Any changes since last visit?  no  Physicians involved in your care Any changes since last visit?  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   Right-handed.   Rare caffeine use.   Social Determinants of Health   Financial Resource Strain: Not on file  Food Insecurity: No Food Insecurity (10/09/2022)   Hunger Vital Sign    Worried About Running Out of Food in the Last Year: Never true    Ran Out of Food in the Last Year: Never true  Transportation Needs: Unmet Transportation Needs (10/09/2022)   PRAPARE - Hydrologist (Medical): Yes    Lack of Transportation (Non-Medical): Yes  Physical Activity: Not on file  Stress: Not on file  Social Connections: Not on file   Past Surgical History:  Procedure Laterality Date   [place]   bladder tack  2007   COCHLEAR IMPLANT     2007   COLONOSCOPY     COLONOSCOPY     HAMMER TOE SURGERY  2014   left foot   REVERSE SHOULDER ARTHROPLASTY Right 12/13/2014   dr Tamera Punt   REVERSE SHOULDER ARTHROPLASTY Right 12/13/2014   Procedure: REVERSE SHOULDER ARTHROPLASTY rt;  Surgeon: Nita Sells, MD;  Location: [facility];  Service: Orthopedics;  Laterality: Right;  Right reverse total shoulder replacement   REVERSE SHOULDER ARTHROPLASTY Left 01/07/2017   Procedure: REVERSE LEFT TOTAL SHOULDER ARTHROPLASTY;  Surgeon: Tania Ade, MD;  Location: [place];  Service: Orthopedics;  Laterality: Left;  Left reveres total shoulder arthroplasty   TOTAL KNEE ARTHROPLASTY Right 12/16/2015   Procedure: TOTAL KNEE ARTHROPLASTY;  Surgeon: Elsie Saas, MD;  Location: [place];  Service: Orthopedics;  Laterality:  Right;   TOTAL KNEE ARTHROPLASTY Left 11/28/2018   Procedure: LEFT TOTAL KNEE ARTHROPLASTY;  Surgeon: Elsie Saas, MD;  Location: [facility];  Service: Orthopedics;  Laterality: Left;   Past Medical History:  Diagnosis Date   Acute blood loss anemia    hx   Anxiety    Arthritis    Balance problem    Bipolar disorder (HCC)    Cochlear implant in place    Cochlear implant in place    right ear   Depression    Dermatitis    on face   Dyspnea    Fibromyalgia    GERD (gastroesophageal reflux disease)    Hard of hearing    Hearing aid worn    left ear   Hearing impaired    has cochlear implant   HTN (hypertension)    Hypokalemia    Meniere disease    takes HCTZ  for her inner ear problem   Mixed hyperlipidemia 09/27/2013   Osteoporosis    Pneumonia 11/2016   Pre-diabetes    Primary localized osteoarthritis of left knee 11/16/2018   Primary localized osteoarthritis of right knee    Slow transit constipation    Unsteady gait    LMP  (LMP Unknown)   Opioid Risk Score:   Fall Risk Score:  `1  Depression screen PHQ 2/9      No data to display          Review of Systems  Musculoskeletal:  Positive for back pain.  All other systems reviewed and are negative.     Objective:   Physical Exam Vitals and nursing note reviewed.  Constitutional:      Appearance: Normal appearance.  Cardiovascular:     Rate and Rhythm: Normal rate and regular rhythm.     Pulses: Normal pulses.     Heart sounds: Normal heart sounds.  Pulmonary:     Effort: Pulmonary effort is normal.     Breath sounds: Normal breath sounds.  Musculoskeletal:     Cervical back: Normal range of motion and neck supple.     Comments: Normal Muscle Bulk and Muscle Testing Reveals:  Upper Extremities: Full ROM and Muscle Strength 5/5 r Lower Extremities: Full ROM and Muscle Strength 5/5 Arises from Table with ease Walking with walker Narrow Based  Gait     Skin:    General: Skin is warm and dry.   Neurological:     Mental Status: She is alert and oriented to person, place, and time.  Psychiatric:        Mood and Affect: Mood normal.        Behavior: Behavior normal.         Assessment & Plan:  1.Cerebellar Stroke: Call Placed to [facility], she has a scheduled appointment with Dr [name]. Continue to moitor.  2. Essential Hypertension: Continue current medication regimen. PCP following. Continue to monitor.  3, Hyperlipidemia: Continue current medication regimen. PCP following. Continue to monitor.  4. Chronic Constipation: .Continue current medication regimen. Continue to monitor.   F/U with Dr Letta Pate in 4- 6 weeks

## 2022-11-03 NOTE — Telephone Encounter (Signed)
Elizabeth Stone called and said they got the patient added back to them so they will be going out to her house Friday or over the weekend.

## 2022-11-04 DIAGNOSIS — E1169 Type 2 diabetes mellitus with other specified complication: Secondary | ICD-10-CM | POA: Diagnosis not present

## 2022-11-04 DIAGNOSIS — E782 Mixed hyperlipidemia: Secondary | ICD-10-CM | POA: Diagnosis not present

## 2022-11-04 DIAGNOSIS — I10 Essential (primary) hypertension: Secondary | ICD-10-CM | POA: Diagnosis not present

## 2022-11-04 DIAGNOSIS — I6930 Unspecified sequelae of cerebral infarction: Secondary | ICD-10-CM | POA: Diagnosis not present

## 2022-11-06 ENCOUNTER — Telehealth: Payer: Self-pay

## 2022-11-06 DIAGNOSIS — E669 Obesity, unspecified: Secondary | ICD-10-CM | POA: Diagnosis not present

## 2022-11-06 DIAGNOSIS — M6281 Muscle weakness (generalized): Secondary | ICD-10-CM | POA: Diagnosis not present

## 2022-11-06 DIAGNOSIS — R519 Headache, unspecified: Secondary | ICD-10-CM | POA: Diagnosis not present

## 2022-11-06 DIAGNOSIS — E871 Hypo-osmolality and hyponatremia: Secondary | ICD-10-CM | POA: Diagnosis not present

## 2022-11-06 DIAGNOSIS — Z96653 Presence of artificial knee joint, bilateral: Secondary | ICD-10-CM | POA: Diagnosis not present

## 2022-11-06 DIAGNOSIS — F319 Bipolar disorder, unspecified: Secondary | ICD-10-CM | POA: Diagnosis not present

## 2022-11-06 DIAGNOSIS — H8109 Meniere's disease, unspecified ear: Secondary | ICD-10-CM | POA: Diagnosis not present

## 2022-11-06 DIAGNOSIS — F419 Anxiety disorder, unspecified: Secondary | ICD-10-CM | POA: Diagnosis not present

## 2022-11-06 DIAGNOSIS — I10 Essential (primary) hypertension: Secondary | ICD-10-CM | POA: Diagnosis not present

## 2022-11-06 DIAGNOSIS — H538 Other visual disturbances: Secondary | ICD-10-CM | POA: Diagnosis not present

## 2022-11-06 DIAGNOSIS — M810 Age-related osteoporosis without current pathological fracture: Secondary | ICD-10-CM | POA: Diagnosis not present

## 2022-11-06 DIAGNOSIS — H9190 Unspecified hearing loss, unspecified ear: Secondary | ICD-10-CM | POA: Diagnosis not present

## 2022-11-06 DIAGNOSIS — Z7984 Long term (current) use of oral hypoglycemic drugs: Secondary | ICD-10-CM | POA: Diagnosis not present

## 2022-11-06 DIAGNOSIS — M797 Fibromyalgia: Secondary | ICD-10-CM | POA: Diagnosis not present

## 2022-11-06 DIAGNOSIS — E782 Mixed hyperlipidemia: Secondary | ICD-10-CM | POA: Diagnosis not present

## 2022-11-06 DIAGNOSIS — Z6829 Body mass index (BMI) 29.0-29.9, adult: Secondary | ICD-10-CM | POA: Diagnosis not present

## 2022-11-06 DIAGNOSIS — I69398 Other sequelae of cerebral infarction: Secondary | ICD-10-CM | POA: Diagnosis not present

## 2022-11-06 DIAGNOSIS — I69328 Other speech and language deficits following cerebral infarction: Secondary | ICD-10-CM | POA: Diagnosis not present

## 2022-11-06 DIAGNOSIS — I700 Atherosclerosis of aorta: Secondary | ICD-10-CM | POA: Diagnosis not present

## 2022-11-06 DIAGNOSIS — H532 Diplopia: Secondary | ICD-10-CM | POA: Diagnosis not present

## 2022-11-06 DIAGNOSIS — D649 Anemia, unspecified: Secondary | ICD-10-CM | POA: Diagnosis not present

## 2022-11-06 DIAGNOSIS — R2689 Other abnormalities of gait and mobility: Secondary | ICD-10-CM | POA: Diagnosis not present

## 2022-11-06 DIAGNOSIS — K219 Gastro-esophageal reflux disease without esophagitis: Secondary | ICD-10-CM | POA: Diagnosis not present

## 2022-11-06 DIAGNOSIS — Z7982 Long term (current) use of aspirin: Secondary | ICD-10-CM | POA: Diagnosis not present

## 2022-11-06 DIAGNOSIS — E1140 Type 2 diabetes mellitus with diabetic neuropathy, unspecified: Secondary | ICD-10-CM | POA: Diagnosis not present

## 2022-11-06 NOTE — Telephone Encounter (Signed)
Assessment was done today for  2wk2 1wk2 2wk1 1wk 1 Verbal order given to [facility] per discharge summary

## 2022-11-07 DIAGNOSIS — E871 Hypo-osmolality and hyponatremia: Secondary | ICD-10-CM | POA: Diagnosis not present

## 2022-11-07 DIAGNOSIS — I10 Essential (primary) hypertension: Secondary | ICD-10-CM | POA: Diagnosis not present

## 2022-11-07 DIAGNOSIS — I69328 Other speech and language deficits following cerebral infarction: Secondary | ICD-10-CM | POA: Diagnosis not present

## 2022-11-07 DIAGNOSIS — H9190 Unspecified hearing loss, unspecified ear: Secondary | ICD-10-CM | POA: Diagnosis not present

## 2022-11-07 DIAGNOSIS — I700 Atherosclerosis of aorta: Secondary | ICD-10-CM | POA: Diagnosis not present

## 2022-11-07 DIAGNOSIS — R2689 Other abnormalities of gait and mobility: Secondary | ICD-10-CM | POA: Diagnosis not present

## 2022-11-07 DIAGNOSIS — R519 Headache, unspecified: Secondary | ICD-10-CM | POA: Diagnosis not present

## 2022-11-07 DIAGNOSIS — E1140 Type 2 diabetes mellitus with diabetic neuropathy, unspecified: Secondary | ICD-10-CM | POA: Diagnosis not present

## 2022-11-07 DIAGNOSIS — H8109 Meniere's disease, unspecified ear: Secondary | ICD-10-CM | POA: Diagnosis not present

## 2022-11-07 DIAGNOSIS — D649 Anemia, unspecified: Secondary | ICD-10-CM | POA: Diagnosis not present

## 2022-11-07 DIAGNOSIS — H538 Other visual disturbances: Secondary | ICD-10-CM | POA: Diagnosis not present

## 2022-11-07 DIAGNOSIS — H532 Diplopia: Secondary | ICD-10-CM | POA: Diagnosis not present

## 2022-11-07 DIAGNOSIS — M810 Age-related osteoporosis without current pathological fracture: Secondary | ICD-10-CM | POA: Diagnosis not present

## 2022-11-07 DIAGNOSIS — M797 Fibromyalgia: Secondary | ICD-10-CM | POA: Diagnosis not present

## 2022-11-07 DIAGNOSIS — M6281 Muscle weakness (generalized): Secondary | ICD-10-CM | POA: Diagnosis not present

## 2022-11-07 DIAGNOSIS — I69398 Other sequelae of cerebral infarction: Secondary | ICD-10-CM | POA: Diagnosis not present

## 2022-11-10 ENCOUNTER — Encounter: Payer: Self-pay | Admitting: Registered Nurse

## 2022-11-10 ENCOUNTER — Telehealth: Payer: Self-pay | Admitting: Physical Medicine & Rehabilitation

## 2022-11-10 DIAGNOSIS — M6281 Muscle weakness (generalized): Secondary | ICD-10-CM | POA: Diagnosis not present

## 2022-11-10 DIAGNOSIS — R2689 Other abnormalities of gait and mobility: Secondary | ICD-10-CM | POA: Diagnosis not present

## 2022-11-10 DIAGNOSIS — R519 Headache, unspecified: Secondary | ICD-10-CM | POA: Diagnosis not present

## 2022-11-10 DIAGNOSIS — I69398 Other sequelae of cerebral infarction: Secondary | ICD-10-CM | POA: Diagnosis not present

## 2022-11-10 DIAGNOSIS — E871 Hypo-osmolality and hyponatremia: Secondary | ICD-10-CM | POA: Diagnosis not present

## 2022-11-10 DIAGNOSIS — I700 Atherosclerosis of aorta: Secondary | ICD-10-CM | POA: Diagnosis not present

## 2022-11-10 DIAGNOSIS — I69328 Other speech and language deficits following cerebral infarction: Secondary | ICD-10-CM | POA: Diagnosis not present

## 2022-11-10 DIAGNOSIS — H538 Other visual disturbances: Secondary | ICD-10-CM | POA: Diagnosis not present

## 2022-11-10 DIAGNOSIS — H8109 Meniere's disease, unspecified ear: Secondary | ICD-10-CM | POA: Diagnosis not present

## 2022-11-10 DIAGNOSIS — H532 Diplopia: Secondary | ICD-10-CM | POA: Diagnosis not present

## 2022-11-10 DIAGNOSIS — E1140 Type 2 diabetes mellitus with diabetic neuropathy, unspecified: Secondary | ICD-10-CM | POA: Diagnosis not present

## 2022-11-10 DIAGNOSIS — H9190 Unspecified hearing loss, unspecified ear: Secondary | ICD-10-CM | POA: Diagnosis not present

## 2022-11-10 DIAGNOSIS — M810 Age-related osteoporosis without current pathological fracture: Secondary | ICD-10-CM | POA: Diagnosis not present

## 2022-11-10 DIAGNOSIS — I10 Essential (primary) hypertension: Secondary | ICD-10-CM | POA: Diagnosis not present

## 2022-11-10 DIAGNOSIS — D649 Anemia, unspecified: Secondary | ICD-10-CM | POA: Diagnosis not present

## 2022-11-10 DIAGNOSIS — M797 Fibromyalgia: Secondary | ICD-10-CM | POA: Diagnosis not present

## 2022-11-10 NOTE — Telephone Encounter (Signed)
Elizabeth Stone ST with [name] needs to get verbal orders to see patient 1w3.  Please call her at [phone].

## 2022-11-12 DIAGNOSIS — H532 Diplopia: Secondary | ICD-10-CM | POA: Diagnosis not present

## 2022-11-12 DIAGNOSIS — H8109 Meniere's disease, unspecified ear: Secondary | ICD-10-CM | POA: Diagnosis not present

## 2022-11-12 DIAGNOSIS — D649 Anemia, unspecified: Secondary | ICD-10-CM | POA: Diagnosis not present

## 2022-11-12 DIAGNOSIS — M810 Age-related osteoporosis without current pathological fracture: Secondary | ICD-10-CM | POA: Diagnosis not present

## 2022-11-12 DIAGNOSIS — E1140 Type 2 diabetes mellitus with diabetic neuropathy, unspecified: Secondary | ICD-10-CM | POA: Diagnosis not present

## 2022-11-12 DIAGNOSIS — R519 Headache, unspecified: Secondary | ICD-10-CM | POA: Diagnosis not present

## 2022-11-12 DIAGNOSIS — I69398 Other sequelae of cerebral infarction: Secondary | ICD-10-CM | POA: Diagnosis not present

## 2022-11-12 DIAGNOSIS — R2689 Other abnormalities of gait and mobility: Secondary | ICD-10-CM | POA: Diagnosis not present

## 2022-11-12 DIAGNOSIS — H9190 Unspecified hearing loss, unspecified ear: Secondary | ICD-10-CM | POA: Diagnosis not present

## 2022-11-12 DIAGNOSIS — H538 Other visual disturbances: Secondary | ICD-10-CM | POA: Diagnosis not present

## 2022-11-12 DIAGNOSIS — I69328 Other speech and language deficits following cerebral infarction: Secondary | ICD-10-CM | POA: Diagnosis not present

## 2022-11-12 DIAGNOSIS — I10 Essential (primary) hypertension: Secondary | ICD-10-CM | POA: Diagnosis not present

## 2022-11-12 DIAGNOSIS — M6281 Muscle weakness (generalized): Secondary | ICD-10-CM | POA: Diagnosis not present

## 2022-11-12 DIAGNOSIS — I700 Atherosclerosis of aorta: Secondary | ICD-10-CM | POA: Diagnosis not present

## 2022-11-12 DIAGNOSIS — E871 Hypo-osmolality and hyponatremia: Secondary | ICD-10-CM | POA: Diagnosis not present

## 2022-11-12 DIAGNOSIS — M797 Fibromyalgia: Secondary | ICD-10-CM | POA: Diagnosis not present

## 2022-11-13 ENCOUNTER — Telehealth: Payer: Self-pay | Admitting: Physical Medicine & Rehabilitation

## 2022-11-13 NOTE — Telephone Encounter (Signed)
Requesting verbal orders SLP once a week for 3 weeks. Please call with approval as soon as possible

## 2022-11-17 DIAGNOSIS — M6281 Muscle weakness (generalized): Secondary | ICD-10-CM | POA: Diagnosis not present

## 2022-11-17 DIAGNOSIS — I700 Atherosclerosis of aorta: Secondary | ICD-10-CM | POA: Diagnosis not present

## 2022-11-17 DIAGNOSIS — E1140 Type 2 diabetes mellitus with diabetic neuropathy, unspecified: Secondary | ICD-10-CM | POA: Diagnosis not present

## 2022-11-17 DIAGNOSIS — H532 Diplopia: Secondary | ICD-10-CM | POA: Diagnosis not present

## 2022-11-17 DIAGNOSIS — H9190 Unspecified hearing loss, unspecified ear: Secondary | ICD-10-CM | POA: Diagnosis not present

## 2022-11-17 DIAGNOSIS — I69398 Other sequelae of cerebral infarction: Secondary | ICD-10-CM | POA: Diagnosis not present

## 2022-11-17 DIAGNOSIS — R519 Headache, unspecified: Secondary | ICD-10-CM | POA: Diagnosis not present

## 2022-11-17 DIAGNOSIS — R2689 Other abnormalities of gait and mobility: Secondary | ICD-10-CM | POA: Diagnosis not present

## 2022-11-17 DIAGNOSIS — M810 Age-related osteoporosis without current pathological fracture: Secondary | ICD-10-CM | POA: Diagnosis not present

## 2022-11-17 DIAGNOSIS — I69328 Other speech and language deficits following cerebral infarction: Secondary | ICD-10-CM | POA: Diagnosis not present

## 2022-11-17 DIAGNOSIS — E871 Hypo-osmolality and hyponatremia: Secondary | ICD-10-CM | POA: Diagnosis not present

## 2022-11-17 DIAGNOSIS — I10 Essential (primary) hypertension: Secondary | ICD-10-CM | POA: Diagnosis not present

## 2022-11-17 DIAGNOSIS — H538 Other visual disturbances: Secondary | ICD-10-CM | POA: Diagnosis not present

## 2022-11-17 DIAGNOSIS — D649 Anemia, unspecified: Secondary | ICD-10-CM | POA: Diagnosis not present

## 2022-11-17 DIAGNOSIS — M797 Fibromyalgia: Secondary | ICD-10-CM | POA: Diagnosis not present

## 2022-11-17 DIAGNOSIS — H8109 Meniere's disease, unspecified ear: Secondary | ICD-10-CM | POA: Diagnosis not present

## 2022-11-17 NOTE — Telephone Encounter (Signed)
Patient had a miss visit for in-home speech therapy due to no call return.   Okay given for in home speech therapy with the missed visit to -be added. [place] Speech Therapist with [place], [place].

## 2022-11-19 ENCOUNTER — Encounter: Payer: Self-pay | Admitting: Adult Health

## 2022-11-19 ENCOUNTER — Ambulatory Visit (INDEPENDENT_AMBULATORY_CARE_PROVIDER_SITE_OTHER): Payer: Medicare Other

## 2022-11-19 ENCOUNTER — Ambulatory Visit: Payer: Medicare Other | Admitting: Adult Health

## 2022-11-19 VITALS — BP 130/70 | HR 95 | Temp 98.2°F

## 2022-11-19 DIAGNOSIS — M797 Fibromyalgia: Secondary | ICD-10-CM | POA: Diagnosis not present

## 2022-11-19 DIAGNOSIS — I69398 Other sequelae of cerebral infarction: Secondary | ICD-10-CM | POA: Diagnosis not present

## 2022-11-19 DIAGNOSIS — R5381 Other malaise: Secondary | ICD-10-CM

## 2022-11-19 DIAGNOSIS — J45909 Unspecified asthma, uncomplicated: Secondary | ICD-10-CM | POA: Insufficient documentation

## 2022-11-19 DIAGNOSIS — J4520 Mild intermittent asthma, uncomplicated: Secondary | ICD-10-CM

## 2022-11-19 DIAGNOSIS — R0609 Other forms of dyspnea: Secondary | ICD-10-CM | POA: Diagnosis not present

## 2022-11-19 DIAGNOSIS — R0600 Dyspnea, unspecified: Secondary | ICD-10-CM | POA: Diagnosis not present

## 2022-11-19 DIAGNOSIS — D649 Anemia, unspecified: Secondary | ICD-10-CM | POA: Diagnosis not present

## 2022-11-19 DIAGNOSIS — M6281 Muscle weakness (generalized): Secondary | ICD-10-CM | POA: Diagnosis not present

## 2022-11-19 DIAGNOSIS — H8109 Meniere's disease, unspecified ear: Secondary | ICD-10-CM | POA: Diagnosis not present

## 2022-11-19 DIAGNOSIS — I639 Cerebral infarction, unspecified: Secondary | ICD-10-CM

## 2022-11-19 DIAGNOSIS — M810 Age-related osteoporosis without current pathological fracture: Secondary | ICD-10-CM | POA: Diagnosis not present

## 2022-11-19 DIAGNOSIS — H532 Diplopia: Secondary | ICD-10-CM | POA: Diagnosis not present

## 2022-11-19 DIAGNOSIS — I10 Essential (primary) hypertension: Secondary | ICD-10-CM | POA: Diagnosis not present

## 2022-11-19 DIAGNOSIS — H9190 Unspecified hearing loss, unspecified ear: Secondary | ICD-10-CM | POA: Diagnosis not present

## 2022-11-19 DIAGNOSIS — I69328 Other speech and language deficits following cerebral infarction: Secondary | ICD-10-CM | POA: Diagnosis not present

## 2022-11-19 DIAGNOSIS — E1140 Type 2 diabetes mellitus with diabetic neuropathy, unspecified: Secondary | ICD-10-CM | POA: Diagnosis not present

## 2022-11-19 DIAGNOSIS — R519 Headache, unspecified: Secondary | ICD-10-CM | POA: Diagnosis not present

## 2022-11-19 DIAGNOSIS — H538 Other visual disturbances: Secondary | ICD-10-CM | POA: Diagnosis not present

## 2022-11-19 DIAGNOSIS — I700 Atherosclerosis of aorta: Secondary | ICD-10-CM | POA: Diagnosis not present

## 2022-11-19 DIAGNOSIS — E871 Hypo-osmolality and hyponatremia: Secondary | ICD-10-CM | POA: Diagnosis not present

## 2022-11-19 DIAGNOSIS — R2689 Other abnormalities of gait and mobility: Secondary | ICD-10-CM | POA: Diagnosis not present

## 2022-11-19 MED ORDER — ALBUTEROL SULFATE HFA 108 (90 BASE) MCG/ACT IN AERS: 1.0000 | INHALATION_SPRAY | Freq: Four times a day (QID) | RESPIRATORY_TRACT | 2 refills | Status: DC | PRN

## 2022-11-19 NOTE — Assessment & Plan Note (Signed)
Intermittent cough and wheezing possible component of reactive airways.  Albuterol inhaler will be refilled.  Control for triggers such as postnasal drainage  Plan  Patient Instructions  Chest xray today  Labs today  Albuterol inhaler 2 puffs every 6hrs as needed -with spacer Follow up with Dr. Lamonte Sakai in 4-6 weks and As needed   Please contact office for sooner follow up if symptoms do not improve or worsen or seek emergency care

## 2022-11-19 NOTE — Assessment & Plan Note (Signed)
Recent hospitalization for strokelike symptoms.  Continue follow-up with primary care and neurology.

## 2022-11-19 NOTE — Assessment & Plan Note (Signed)
Activity as tolerated.  Continue with home PT.

## 2022-11-19 NOTE — Progress Notes (Signed)
$'@Patient'g$  ID: Elizabeth Stone, female    DOB: Mar 16, 1946, 78 y.o.   MRN: [id number]  Chief Complaint  Patient presents with   Acute Visit    Referring provider: Orpah Melter, MD  HPI: 77 year old female former smoker seen for pulmonary consult June 2021 for shortness of breath Medical history significant for diabetes, bipolar disorder, stroke, chronic pain, spinal stenosis  TEST/EVENTS :  2D echo October 08, 2022 EF 65 to 37%, grade 1 diastolic dysfunction, RV SF normal  Stress Myoview November 18, 2018 low risk study EF at 73%.  PFTs June 13, 2020 were normal, FEV1 102%, ratio 89, FVC 86%, DLCO 105%.,  No significant bronchodilator response.  11/19/2022 Follow up : Dyspnea  Patient presents for a follow up visit.  Patient was last seen August 2021.  Patient complains that she has had chronic shortness of breath for the last few years.  But over the last couple months feels that her breathing is not getting any better.  She gets short of breath with minimum activities.  Patient was admitted to the hospital in December for strokelike symptoms.  She did receive thrombolytics.  She initially presented with headache, blurred vision and speech changes.  CT head was negative for acute process.  Patient was unable to get MRI brain due to cochlear implant.  CT angio head was negative for intracranial large vessel occlusion.  Patient was discharged to rehab center.  Patient says she is now home.  Is receiving physical therapy at home.  She says she has chronic balance issues and can only walk short distances with a walker.  Typically is in the wheelchair. Patient says she has an occasional cough and intermittent wheezing.  No fever or discolored mucus.  Does have a history of seasonal allergies.  Has had albuterol inhaler in the past but has not used for a long time.  She denies any chest pain, hemoptysis, abdominal pain nausea vomiting or increased leg swelling.  Allergies  Allergen Reactions    Adhesive [Tape] Rash and Other (See Comments)    PT STATED ALLERGY TO TAPE, NOT LATEX GLOVES AS PREVIOUSLY DOCUMENTED (Pt prefers paper tape.)    Immunization History  Administered Date(s) Administered   Fluad Quad(high Dose 65+) 07/31/2022   PFIZER(Purple Top)SARS-COV-2 Vaccination 12/18/2019, 01/16/2020, 09/18/2020   PPD Test 12/21/2015   Pfizer Covid-19 Vaccine Bivalent Booster 5yr & up 08/08/2022   Respiratory Syncytial Virus Vaccine,Recomb Aduvanted(Arexvy) 08/08/2022   Tdap 03/15/2021    Past Medical History:  Diagnosis Date   Acute blood loss anemia    hx   Anxiety    Arthritis    Balance problem    Bipolar disorder (HCC)    Cochlear implant in place    Cochlear implant in place    right ear   Depression    Dermatitis    on face   Dyspnea    Fibromyalgia    GERD (gastroesophageal reflux disease)    Hard of hearing    Hearing aid worn    left ear   Hearing impaired    has cochlear implant   HTN (hypertension)    Hypokalemia    Meniere disease    takes HCTZ for her inner ear problem   Mixed hyperlipidemia 09/27/2013   Osteoporosis    Pneumonia 11/2016   Pre-diabetes    Primary localized osteoarthritis of left knee 11/16/2018   Primary localized osteoarthritis of right knee    Slow transit constipation    Unsteady gait  Tobacco History: Social History   Tobacco Use  Smoking Status Former   Packs/day: 0.25   Years: 23.00   Total pack years: 5.75   Types: Cigarettes   Quit date: 08/25/1993   Years since quitting: 29.2  Smokeless Tobacco Never   Counseling given: Not Answered   Outpatient Medications Prior to Visit  Medication Sig Dispense Refill   cetirizine (ZYRTEC) 10 MG chewable tablet Chew 10 mg by mouth at bedtime.     acetaminophen (TYLENOL) 325 MG tablet Take 1 tablet (325 mg total) by mouth every 4 (four) hours as needed for mild pain (or temp > 37.5 C (99.5 F)).     amLODipine (NORVASC) 5 MG tablet Take 1 tablet (5 mg total) by mouth  daily. 30 tablet 0   Asenapine Maleate 10 MG SUBL Place 1 tablet under the tongue at bedtime.     aspirin EC 81 MG tablet Take 1 tablet (81 mg total) by mouth daily. Swallow whole. 30 tablet 12   atorvastatin (LIPITOR) 20 MG tablet Take 1 tablet (20 mg total) by mouth at bedtime. 30 tablet 0   B Complex-C (B-COMPLEX WITH VITAMIN C) tablet Take 1 tablet by mouth daily. 30 tablet 0   buPROPion (WELLBUTRIN XL) 300 MG 24 hr tablet Take 1 tablet (300 mg total) by mouth daily. 30 tablet 0   clonazePAM (KLONOPIN) 1 MG tablet Take 1 tablet (1 mg total) by mouth at bedtime. 30 tablet 0   DULoxetine (CYMBALTA) 60 MG capsule Take 1 capsule (60 mg total) by mouth 2 (two) times daily. 60 capsule 0   esomeprazole (NEXIUM) 20 MG capsule Take 1 capsule (20 mg total) by mouth daily. 30 capsule 0   gabapentin (NEURONTIN) 300 MG capsule Take 1 capsule (300 mg total) by mouth at bedtime. 30 capsule 0   hydrochlorothiazide (HYDRODIURIL) 25 MG tablet Take 1 tablet (25 mg total) by mouth daily. 30 tablet 0   HYDROcodone-acetaminophen (NORCO) 10-325 MG tablet Take 1 tablet by mouth every 12 (twelve) hours as needed for severe pain. 30 tablet 0   lamoTRIgine (LAMICTAL) 150 MG tablet Take 2 tablets (300 mg total) by mouth daily. 60 tablet 0   losartan (COZAAR) 100 MG tablet Take 1 tablet (100 mg total) by mouth daily. 30 tablet 0   magnesium oxide (MAG-OX) 400 MG tablet Take 0.5 tablets (200 mg total) by mouth at bedtime. 15 tablet 0   metFORMIN (GLUCOPHAGE-XR) 750 MG 24 hr tablet Take 1 tablet (750 mg total) by mouth every evening. 30 tablet 0   montelukast (SINGULAIR) 10 MG tablet Take 1 tablet (10 mg total) by mouth at bedtime. 30 tablet 0   traZODone (DESYREL) 50 MG tablet Take 1 tablet (50 mg total) by mouth at bedtime. 30 tablet 0   VITAMIN D PO Take 1,000 Units by mouth daily.     Vitamin D, Ergocalciferol, (DRISDOL) 1.25 MG (50000 UNIT) CAPS capsule Take 1 capsule (50,000 Units total) by mouth every 7 (seven) days.  5 capsule 0   No facility-administered medications prior to visit.     Review of Systems:   Constitutional:   No  weight loss, night sweats,  Fevers, chills, fatigue, or  lassitude.  HEENT:   No headaches,  Difficulty swallowing,  Tooth/dental problems, or  Sore throat,                No sneezing, itching, ear ache, nasal congestion, post nasal drip,   CV:  No chest pain,  Orthopnea,  PND, swelling in lower extremities, anasarca, dizziness, palpitations, syncope.   GI  No heartburn, indigestion, abdominal pain, nausea, vomiting, diarrhea, change in bowel habits, loss of appetite, bloody stools.   Resp: No shortness of breath with exertion or at rest.  No excess mucus, no productive cough,  No non-productive cough,  No coughing up of blood.  No change in color of mucus.  No wheezing.  No chest wall deformity  Skin: no rash or lesions.  GU: no dysuria, change in color of urine, no urgency or frequency.  No flank pain, no hematuria   MS:  No joint pain or swelling.  No decreased range of motion.  No back pain.    Physical Exam  BP 130/70 (BP Location: Right Arm, Patient Position: Sitting)   Pulse 95   Temp 98.2 F (36.8 C) (Oral)   LMP  (LMP Unknown)   SpO2 96%   GEN: A/Ox3; pleasant , NAD, well nourished , elderly, in wheelchair   HEENT:  [place]/AT,   NOSE-clear, THROAT-clear, no lesions, no postnasal drip or exudate noted.   NECK:  Supple w/ fair ROM; no JVD; normal carotid impulses w/o bruits; no thyromegaly or nodules palpated; no lymphadenopathy.    RESP  Clear  P & A; w/o, wheezes/ rales/ or rhonchi. no accessory muscle use, no dullness to percussion  CARD:  RRR, no m/r/g, no peripheral edema, pulses intact, no cyanosis or clubbing.  GI:   Soft & nt; nml bowel sounds; no organomegaly or masses detected.   Musco: Warm bil, no deformities or joint swelling noted.   Neuro: alert, no focal deficits noted.    Skin: Warm, no lesions or rashes    Lab  Results:  CBC   ProBNP No results found for: "PROBNP"  Imaging: DG Chest 2 View  Result Date: 11/19/2022 CLINICAL DATA:  Dyspnea EXAM: CHEST - 2 VIEW COMPARISON:  None Available. FINDINGS: Normal mediastinum and cardiac silhouette. Normal pulmonary vasculature. No evidence of effusion, infiltrate, or pneumothorax. No acute bony abnormality. Bilateral shoulder prosthetics. Degenerative osteophytosis of the spine. IMPRESSION: No acute cardiopulmonary process. Electronically Signed   By: Suzy Bouchard M.D.   On: 11/19/2022 16:19         Latest Ref Rng & Units 06/13/2020   12:06 PM  PFT Results  FVC-Pre L 2.28   FVC-Predicted Pre % 87   FVC-Post L 2.26   FVC-Predicted Post % 86   Pre FEV1/FVC % % 87   Post FEV1/FCV % % 89   FEV1-Pre L 1.98   FEV1-Predicted Pre % 100   FEV1-Post L 2.01   DLCO uncorrected ml/min/mmHg 18.47   DLCO UNC% % 105   DLCO corrected ml/min/mmHg 18.47   DLCO COR %Predicted % 105   DLVA Predicted % 108   TLC L 6.37   TLC % Predicted % 138   RV % Predicted % 174     No results found for: "NITRICOXIDE"      Assessment & Plan:   Dyspnea on exertion Chronic dyspnea-suspect is multifactorial.  Chest x-ray today shows no acute process.  Previous PFTs in 2021 showed normal lung function.  Patient has recently had a prolonged hospitalization and is immobile.  Will check D-dimer.  If positive will need a CT chest with PE protocol.  Plan  Patient Instructions  Chest xray today  Labs today  Albuterol inhaler 2 puffs every 6hrs as needed -with spacer Follow up with Dr. Lamonte Sakai in 4-6 weks and As needed  Please contact office for sooner follow up if symptoms do not improve or worsen or seek emergency care      RAD (reactive airway disease) Intermittent cough and wheezing possible component of reactive airways.  Albuterol inhaler will be refilled.  Control for triggers such as postnasal drainage  Plan  Patient Instructions  Chest xray today  Labs  today  Albuterol inhaler 2 puffs every 6hrs as needed -with spacer Follow up with Dr. Lamonte Sakai in 4-6 weks and As needed   Please contact office for sooner follow up if symptoms do not improve or worsen or seek emergency care      [facility]) Recent hospitalization for strokelike symptoms.  Continue follow-up with primary care and neurology.  Physical deconditioning Activity as tolerated.  Continue with home PT.   I spent 40   minutes dedicated to the care of this patient on the date of this encounter to include pre-visit review of records, face-to-face time with the patient discussing conditions above, post visit ordering of testing, clinical documentation with the electronic health record, making appropriate referrals as documented, and communicating necessary findings to members of the patients care team.   Rexene Edison, NP 11/19/2022

## 2022-11-19 NOTE — Patient Instructions (Addendum)
Chest xray today  Labs today  Albuterol inhaler 2 puffs every 6hrs as needed -with spacer Follow up with Dr. Lamonte Sakai in 4-6 weks and As needed   Please contact office for sooner follow up if symptoms do not improve or worsen or seek emergency care

## 2022-11-19 NOTE — Assessment & Plan Note (Signed)
Chronic dyspnea-suspect is multifactorial.  Chest x-ray today shows no acute process.  Previous PFTs in 2021 showed normal lung function.  Patient has recently had a prolonged hospitalization and is immobile.  Will check D-dimer.  If positive will need a CT chest with PE protocol.  Plan  Patient Instructions  Chest xray today  Labs today  Albuterol inhaler 2 puffs every 6hrs as needed -with spacer Follow up with Dr. Lamonte Sakai in 4-6 weks and As needed   Please contact office for sooner follow up if symptoms do not improve or worsen or seek emergency care

## 2022-11-20 MED ORDER — SPACER/AERO-HOLDING CHAMBERS DEVI: 1.0000 | 0 refills | Status: DC | PRN

## 2022-11-20 NOTE — Progress Notes (Signed)
Patient seen in the office today and instructed on use of Albuterol.  Patient expressed understanding and demonstrated technique. 

## 2022-11-20 NOTE — Addendum Note (Signed)
Addended by: Vanessa Barbara on: 11/20/2022 05:24 PM   Modules accepted: Orders

## 2022-11-21 DIAGNOSIS — I69398 Other sequelae of cerebral infarction: Secondary | ICD-10-CM | POA: Diagnosis not present

## 2022-11-21 DIAGNOSIS — E871 Hypo-osmolality and hyponatremia: Secondary | ICD-10-CM | POA: Diagnosis not present

## 2022-11-21 DIAGNOSIS — I10 Essential (primary) hypertension: Secondary | ICD-10-CM | POA: Diagnosis not present

## 2022-11-21 DIAGNOSIS — M6281 Muscle weakness (generalized): Secondary | ICD-10-CM | POA: Diagnosis not present

## 2022-11-21 DIAGNOSIS — M797 Fibromyalgia: Secondary | ICD-10-CM | POA: Diagnosis not present

## 2022-11-21 DIAGNOSIS — H8109 Meniere's disease, unspecified ear: Secondary | ICD-10-CM | POA: Diagnosis not present

## 2022-11-21 DIAGNOSIS — I69328 Other speech and language deficits following cerebral infarction: Secondary | ICD-10-CM | POA: Diagnosis not present

## 2022-11-21 DIAGNOSIS — H532 Diplopia: Secondary | ICD-10-CM | POA: Diagnosis not present

## 2022-11-21 DIAGNOSIS — R519 Headache, unspecified: Secondary | ICD-10-CM | POA: Diagnosis not present

## 2022-11-21 DIAGNOSIS — R2689 Other abnormalities of gait and mobility: Secondary | ICD-10-CM | POA: Diagnosis not present

## 2022-11-21 DIAGNOSIS — D649 Anemia, unspecified: Secondary | ICD-10-CM | POA: Diagnosis not present

## 2022-11-21 DIAGNOSIS — E1140 Type 2 diabetes mellitus with diabetic neuropathy, unspecified: Secondary | ICD-10-CM | POA: Diagnosis not present

## 2022-11-21 DIAGNOSIS — H538 Other visual disturbances: Secondary | ICD-10-CM | POA: Diagnosis not present

## 2022-11-21 DIAGNOSIS — I700 Atherosclerosis of aorta: Secondary | ICD-10-CM | POA: Diagnosis not present

## 2022-11-21 DIAGNOSIS — H9190 Unspecified hearing loss, unspecified ear: Secondary | ICD-10-CM | POA: Diagnosis not present

## 2022-11-21 DIAGNOSIS — M810 Age-related osteoporosis without current pathological fracture: Secondary | ICD-10-CM | POA: Diagnosis not present

## 2022-11-24 NOTE — Progress Notes (Signed)
ATC x1.  Left detailed message per DPR.  Left phone number to call in case she had any questions.

## 2022-11-24 NOTE — Progress Notes (Signed)
Patient called back and was appreciative for the call.  No questions.  Nothing further needed.

## 2022-11-25 DIAGNOSIS — H40013 Open angle with borderline findings, low risk, bilateral: Secondary | ICD-10-CM | POA: Diagnosis not present

## 2022-11-25 DIAGNOSIS — I69898 Other sequelae of other cerebrovascular disease: Secondary | ICD-10-CM | POA: Diagnosis not present

## 2022-11-25 DIAGNOSIS — H524 Presbyopia: Secondary | ICD-10-CM | POA: Diagnosis not present

## 2022-11-25 DIAGNOSIS — H35033 Hypertensive retinopathy, bilateral: Secondary | ICD-10-CM | POA: Diagnosis not present

## 2022-11-25 DIAGNOSIS — E119 Type 2 diabetes mellitus without complications: Secondary | ICD-10-CM | POA: Diagnosis not present

## 2022-11-26 DIAGNOSIS — M6281 Muscle weakness (generalized): Secondary | ICD-10-CM | POA: Diagnosis not present

## 2022-11-26 DIAGNOSIS — M797 Fibromyalgia: Secondary | ICD-10-CM | POA: Diagnosis not present

## 2022-11-26 DIAGNOSIS — H9190 Unspecified hearing loss, unspecified ear: Secondary | ICD-10-CM | POA: Diagnosis not present

## 2022-11-26 DIAGNOSIS — I69398 Other sequelae of cerebral infarction: Secondary | ICD-10-CM | POA: Diagnosis not present

## 2022-11-26 DIAGNOSIS — E1140 Type 2 diabetes mellitus with diabetic neuropathy, unspecified: Secondary | ICD-10-CM | POA: Diagnosis not present

## 2022-11-26 DIAGNOSIS — H538 Other visual disturbances: Secondary | ICD-10-CM | POA: Diagnosis not present

## 2022-11-26 DIAGNOSIS — R519 Headache, unspecified: Secondary | ICD-10-CM | POA: Diagnosis not present

## 2022-11-26 DIAGNOSIS — I700 Atherosclerosis of aorta: Secondary | ICD-10-CM | POA: Diagnosis not present

## 2022-11-26 DIAGNOSIS — M810 Age-related osteoporosis without current pathological fracture: Secondary | ICD-10-CM | POA: Diagnosis not present

## 2022-11-26 DIAGNOSIS — H532 Diplopia: Secondary | ICD-10-CM | POA: Diagnosis not present

## 2022-11-26 DIAGNOSIS — D649 Anemia, unspecified: Secondary | ICD-10-CM | POA: Diagnosis not present

## 2022-11-26 DIAGNOSIS — I69328 Other speech and language deficits following cerebral infarction: Secondary | ICD-10-CM | POA: Diagnosis not present

## 2022-11-26 DIAGNOSIS — E871 Hypo-osmolality and hyponatremia: Secondary | ICD-10-CM | POA: Diagnosis not present

## 2022-11-26 DIAGNOSIS — H8109 Meniere's disease, unspecified ear: Secondary | ICD-10-CM | POA: Diagnosis not present

## 2022-11-26 DIAGNOSIS — R2689 Other abnormalities of gait and mobility: Secondary | ICD-10-CM | POA: Diagnosis not present

## 2022-11-26 DIAGNOSIS — I10 Essential (primary) hypertension: Secondary | ICD-10-CM | POA: Diagnosis not present

## 2022-11-27 DIAGNOSIS — H8109 Meniere's disease, unspecified ear: Secondary | ICD-10-CM | POA: Diagnosis not present

## 2022-11-27 DIAGNOSIS — I69328 Other speech and language deficits following cerebral infarction: Secondary | ICD-10-CM | POA: Diagnosis not present

## 2022-11-27 DIAGNOSIS — I10 Essential (primary) hypertension: Secondary | ICD-10-CM | POA: Diagnosis not present

## 2022-11-27 DIAGNOSIS — E871 Hypo-osmolality and hyponatremia: Secondary | ICD-10-CM | POA: Diagnosis not present

## 2022-11-27 DIAGNOSIS — H532 Diplopia: Secondary | ICD-10-CM | POA: Diagnosis not present

## 2022-11-27 DIAGNOSIS — I69398 Other sequelae of cerebral infarction: Secondary | ICD-10-CM | POA: Diagnosis not present

## 2022-11-27 DIAGNOSIS — M797 Fibromyalgia: Secondary | ICD-10-CM | POA: Diagnosis not present

## 2022-11-27 DIAGNOSIS — E1140 Type 2 diabetes mellitus with diabetic neuropathy, unspecified: Secondary | ICD-10-CM | POA: Diagnosis not present

## 2022-11-27 DIAGNOSIS — M6281 Muscle weakness (generalized): Secondary | ICD-10-CM | POA: Diagnosis not present

## 2022-11-27 DIAGNOSIS — H538 Other visual disturbances: Secondary | ICD-10-CM | POA: Diagnosis not present

## 2022-11-27 DIAGNOSIS — I700 Atherosclerosis of aorta: Secondary | ICD-10-CM | POA: Diagnosis not present

## 2022-11-27 DIAGNOSIS — H9190 Unspecified hearing loss, unspecified ear: Secondary | ICD-10-CM | POA: Diagnosis not present

## 2022-11-27 DIAGNOSIS — D649 Anemia, unspecified: Secondary | ICD-10-CM | POA: Diagnosis not present

## 2022-11-27 DIAGNOSIS — M810 Age-related osteoporosis without current pathological fracture: Secondary | ICD-10-CM | POA: Diagnosis not present

## 2022-11-27 DIAGNOSIS — R519 Headache, unspecified: Secondary | ICD-10-CM | POA: Diagnosis not present

## 2022-11-27 DIAGNOSIS — R2689 Other abnormalities of gait and mobility: Secondary | ICD-10-CM | POA: Diagnosis not present

## 2022-11-30 ENCOUNTER — Other Ambulatory Visit: Payer: Self-pay

## 2022-11-30 ENCOUNTER — Other Ambulatory Visit (INDEPENDENT_AMBULATORY_CARE_PROVIDER_SITE_OTHER): Payer: Medicare Other

## 2022-11-30 DIAGNOSIS — R0600 Dyspnea, unspecified: Secondary | ICD-10-CM | POA: Diagnosis not present

## 2022-11-30 DIAGNOSIS — R7989 Other specified abnormal findings of blood chemistry: Secondary | ICD-10-CM

## 2022-11-30 LAB — D-DIMER, QUANTITATIVE: D-Dimer, Quant: 0.75 mcg/mL FEU — ABNORMAL HIGH (ref <0.50)

## 2022-11-30 LAB — BASIC METABOLIC PANEL
BUN: 11 mg/dL (ref 6–23)
CO2: 25 mEq/L (ref 19–32)
Calcium: 9.6 mg/dL (ref 8.4–10.5)
Chloride: 98 mEq/L (ref 96–112)
Creatinine, Ser: 0.68 mg/dL (ref 0.40–1.20)
GFR: 85.84 mL/min (ref >60.00)
Glucose, Bld: 94 mg/dL (ref 70–99)
Potassium: 3.7 mEq/L (ref 3.5–5.1)
Sodium: 133 mEq/L — ABNORMAL LOW (ref 135–145)

## 2022-11-30 NOTE — Addendum Note (Signed)
Addended byOralia Rud M on: 11/30/2022 04:58 PM   Modules accepted: Orders

## 2022-12-01 ENCOUNTER — Encounter: Payer: Self-pay | Admitting: Gastroenterology

## 2022-12-01 ENCOUNTER — Ambulatory Visit (HOSPITAL_COMMUNITY)
Admission: RE | Admit: 2022-12-01 | Discharge: 2022-12-01 | Disposition: A | Discharge status: Home / Self Care | Payer: Medicare Other | Source: Ambulatory Visit | Attending: Adult Health | Admitting: Adult Health

## 2022-12-01 ENCOUNTER — Telehealth: Payer: Self-pay | Admitting: *Deleted

## 2022-12-01 ENCOUNTER — Ambulatory Visit (HOSPITAL_BASED_OUTPATIENT_CLINIC_OR_DEPARTMENT_OTHER): Payer: Medicare Other

## 2022-12-01 DIAGNOSIS — R7989 Other specified abnormal findings of blood chemistry: Secondary | ICD-10-CM | POA: Diagnosis not present

## 2022-12-01 MED ORDER — SODIUM CHLORIDE (PF) 0.9 % IJ SOLN
INTRAMUSCULAR | Status: AC
  Filled 2022-12-01: qty 50

## 2022-12-01 MED ORDER — IOHEXOL 350 MG/ML SOLN
75.0000 mL | Freq: Once | INTRAVENOUS | Status: AC | PRN
  Administered 2022-12-01: 75 mL via INTRAVENOUS

## 2022-12-01 NOTE — Telephone Encounter (Signed)
Lab work shows elevated D-dimer.  This can sometimes be elevated in blood clots.  Need to set patient up for a CT chest angio with PE protocol.   Called and spoke with patient, advised of results/recommendations per Rexene Edison NP.  She verbalized understanding and will await the call from [facility]) to schedule.  Nothing further needed.

## 2022-12-02 ENCOUNTER — Other Ambulatory Visit: Payer: Self-pay | Admitting: Family Medicine

## 2022-12-02 DIAGNOSIS — E041 Nontoxic single thyroid nodule: Secondary | ICD-10-CM

## 2022-12-03 DIAGNOSIS — I10 Essential (primary) hypertension: Secondary | ICD-10-CM | POA: Diagnosis not present

## 2022-12-03 DIAGNOSIS — E1140 Type 2 diabetes mellitus with diabetic neuropathy, unspecified: Secondary | ICD-10-CM | POA: Diagnosis not present

## 2022-12-03 DIAGNOSIS — M6281 Muscle weakness (generalized): Secondary | ICD-10-CM | POA: Diagnosis not present

## 2022-12-03 DIAGNOSIS — M797 Fibromyalgia: Secondary | ICD-10-CM | POA: Diagnosis not present

## 2022-12-03 DIAGNOSIS — H9190 Unspecified hearing loss, unspecified ear: Secondary | ICD-10-CM | POA: Diagnosis not present

## 2022-12-03 DIAGNOSIS — R2689 Other abnormalities of gait and mobility: Secondary | ICD-10-CM | POA: Diagnosis not present

## 2022-12-03 DIAGNOSIS — H8109 Meniere's disease, unspecified ear: Secondary | ICD-10-CM | POA: Diagnosis not present

## 2022-12-03 DIAGNOSIS — H538 Other visual disturbances: Secondary | ICD-10-CM | POA: Diagnosis not present

## 2022-12-03 DIAGNOSIS — M810 Age-related osteoporosis without current pathological fracture: Secondary | ICD-10-CM | POA: Diagnosis not present

## 2022-12-03 DIAGNOSIS — E871 Hypo-osmolality and hyponatremia: Secondary | ICD-10-CM | POA: Diagnosis not present

## 2022-12-03 DIAGNOSIS — H532 Diplopia: Secondary | ICD-10-CM | POA: Diagnosis not present

## 2022-12-03 DIAGNOSIS — D649 Anemia, unspecified: Secondary | ICD-10-CM | POA: Diagnosis not present

## 2022-12-03 DIAGNOSIS — I69398 Other sequelae of cerebral infarction: Secondary | ICD-10-CM | POA: Diagnosis not present

## 2022-12-03 DIAGNOSIS — I700 Atherosclerosis of aorta: Secondary | ICD-10-CM | POA: Diagnosis not present

## 2022-12-03 DIAGNOSIS — R519 Headache, unspecified: Secondary | ICD-10-CM | POA: Diagnosis not present

## 2022-12-03 DIAGNOSIS — I69328 Other speech and language deficits following cerebral infarction: Secondary | ICD-10-CM | POA: Diagnosis not present

## 2022-12-03 NOTE — Progress Notes (Signed)
ATC x1.  LVM to return call. 

## 2022-12-04 ENCOUNTER — Telehealth: Payer: Self-pay

## 2022-12-04 NOTE — Telephone Encounter (Signed)
Speech therapist would like to know if you can approve verbal orders for 1 week x2 for Ms. Elizabeth Stone

## 2022-12-05 DIAGNOSIS — I69328 Other speech and language deficits following cerebral infarction: Secondary | ICD-10-CM | POA: Diagnosis not present

## 2022-12-05 DIAGNOSIS — I700 Atherosclerosis of aorta: Secondary | ICD-10-CM | POA: Diagnosis not present

## 2022-12-05 DIAGNOSIS — E871 Hypo-osmolality and hyponatremia: Secondary | ICD-10-CM | POA: Diagnosis not present

## 2022-12-05 DIAGNOSIS — H9190 Unspecified hearing loss, unspecified ear: Secondary | ICD-10-CM | POA: Diagnosis not present

## 2022-12-05 DIAGNOSIS — M797 Fibromyalgia: Secondary | ICD-10-CM | POA: Diagnosis not present

## 2022-12-05 DIAGNOSIS — D649 Anemia, unspecified: Secondary | ICD-10-CM | POA: Diagnosis not present

## 2022-12-05 DIAGNOSIS — H532 Diplopia: Secondary | ICD-10-CM | POA: Diagnosis not present

## 2022-12-05 DIAGNOSIS — H538 Other visual disturbances: Secondary | ICD-10-CM | POA: Diagnosis not present

## 2022-12-05 DIAGNOSIS — I69398 Other sequelae of cerebral infarction: Secondary | ICD-10-CM | POA: Diagnosis not present

## 2022-12-05 DIAGNOSIS — H8109 Meniere's disease, unspecified ear: Secondary | ICD-10-CM | POA: Diagnosis not present

## 2022-12-05 DIAGNOSIS — M6281 Muscle weakness (generalized): Secondary | ICD-10-CM | POA: Diagnosis not present

## 2022-12-05 DIAGNOSIS — R2689 Other abnormalities of gait and mobility: Secondary | ICD-10-CM | POA: Diagnosis not present

## 2022-12-05 DIAGNOSIS — I10 Essential (primary) hypertension: Secondary | ICD-10-CM | POA: Diagnosis not present

## 2022-12-05 DIAGNOSIS — M810 Age-related osteoporosis without current pathological fracture: Secondary | ICD-10-CM | POA: Diagnosis not present

## 2022-12-05 DIAGNOSIS — E1140 Type 2 diabetes mellitus with diabetic neuropathy, unspecified: Secondary | ICD-10-CM | POA: Diagnosis not present

## 2022-12-05 DIAGNOSIS — R519 Headache, unspecified: Secondary | ICD-10-CM | POA: Diagnosis not present

## 2022-12-08 DIAGNOSIS — L820 Inflamed seborrheic keratosis: Secondary | ICD-10-CM | POA: Diagnosis not present

## 2022-12-09 DIAGNOSIS — R2689 Other abnormalities of gait and mobility: Secondary | ICD-10-CM | POA: Diagnosis not present

## 2022-12-09 DIAGNOSIS — Z7982 Long term (current) use of aspirin: Secondary | ICD-10-CM | POA: Diagnosis not present

## 2022-12-09 DIAGNOSIS — H538 Other visual disturbances: Secondary | ICD-10-CM | POA: Diagnosis not present

## 2022-12-09 DIAGNOSIS — I700 Atherosclerosis of aorta: Secondary | ICD-10-CM | POA: Diagnosis not present

## 2022-12-09 DIAGNOSIS — R064 Hyperventilation: Secondary | ICD-10-CM | POA: Diagnosis not present

## 2022-12-09 DIAGNOSIS — K219 Gastro-esophageal reflux disease without esophagitis: Secondary | ICD-10-CM | POA: Diagnosis not present

## 2022-12-09 DIAGNOSIS — E871 Hypo-osmolality and hyponatremia: Secondary | ICD-10-CM | POA: Diagnosis not present

## 2022-12-09 DIAGNOSIS — E669 Obesity, unspecified: Secondary | ICD-10-CM | POA: Diagnosis not present

## 2022-12-09 DIAGNOSIS — M797 Fibromyalgia: Secondary | ICD-10-CM | POA: Diagnosis not present

## 2022-12-09 DIAGNOSIS — D649 Anemia, unspecified: Secondary | ICD-10-CM | POA: Diagnosis not present

## 2022-12-09 DIAGNOSIS — M810 Age-related osteoporosis without current pathological fracture: Secondary | ICD-10-CM | POA: Diagnosis not present

## 2022-12-09 DIAGNOSIS — R079 Chest pain, unspecified: Secondary | ICD-10-CM | POA: Diagnosis not present

## 2022-12-09 DIAGNOSIS — H9190 Unspecified hearing loss, unspecified ear: Secondary | ICD-10-CM | POA: Diagnosis not present

## 2022-12-09 DIAGNOSIS — Z7984 Long term (current) use of oral hypoglycemic drugs: Secondary | ICD-10-CM | POA: Diagnosis not present

## 2022-12-09 DIAGNOSIS — E782 Mixed hyperlipidemia: Secondary | ICD-10-CM | POA: Diagnosis not present

## 2022-12-09 DIAGNOSIS — Z6829 Body mass index (BMI) 29.0-29.9, adult: Secondary | ICD-10-CM | POA: Diagnosis not present

## 2022-12-09 DIAGNOSIS — E1140 Type 2 diabetes mellitus with diabetic neuropathy, unspecified: Secondary | ICD-10-CM | POA: Diagnosis not present

## 2022-12-09 DIAGNOSIS — R519 Headache, unspecified: Secondary | ICD-10-CM | POA: Diagnosis not present

## 2022-12-09 DIAGNOSIS — F319 Bipolar disorder, unspecified: Secondary | ICD-10-CM | POA: Diagnosis not present

## 2022-12-09 DIAGNOSIS — M6281 Muscle weakness (generalized): Secondary | ICD-10-CM | POA: Diagnosis not present

## 2022-12-09 DIAGNOSIS — Z96653 Presence of artificial knee joint, bilateral: Secondary | ICD-10-CM | POA: Diagnosis not present

## 2022-12-09 DIAGNOSIS — I10 Essential (primary) hypertension: Secondary | ICD-10-CM | POA: Diagnosis not present

## 2022-12-09 DIAGNOSIS — H8109 Meniere's disease, unspecified ear: Secondary | ICD-10-CM | POA: Diagnosis not present

## 2022-12-09 DIAGNOSIS — H532 Diplopia: Secondary | ICD-10-CM | POA: Diagnosis not present

## 2022-12-09 DIAGNOSIS — R0789 Other chest pain: Secondary | ICD-10-CM | POA: Diagnosis not present

## 2022-12-09 DIAGNOSIS — R450 Nervousness: Secondary | ICD-10-CM | POA: Diagnosis not present

## 2022-12-09 DIAGNOSIS — F419 Anxiety disorder, unspecified: Secondary | ICD-10-CM | POA: Diagnosis not present

## 2022-12-09 DIAGNOSIS — I69328 Other speech and language deficits following cerebral infarction: Secondary | ICD-10-CM | POA: Diagnosis not present

## 2022-12-09 DIAGNOSIS — I69398 Other sequelae of cerebral infarction: Secondary | ICD-10-CM | POA: Diagnosis not present

## 2022-12-11 ENCOUNTER — Encounter: Payer: Self-pay | Admitting: Physical Medicine & Rehabilitation

## 2022-12-11 ENCOUNTER — Encounter: Payer: Medicare Other | Attending: Registered Nurse | Admitting: Physical Medicine & Rehabilitation

## 2022-12-11 VITALS — BP 145/73 | HR 84

## 2022-12-11 DIAGNOSIS — I639 Cerebral infarction, unspecified: Secondary | ICD-10-CM | POA: Insufficient documentation

## 2022-12-11 NOTE — Progress Notes (Signed)
Subjective:    Patient ID: Elizabeth Stone, female    DOB: 03-11-1946, 77 y.o.   MRN: [id number]  77 year old woman who was admitted with stroke like symptoms s/p TNK who has continued impaired speech, intermittent headache, blurry vision, and diplopia. Admitting to CIR on 12/24 with above deficits.     HPI Hx of cochlear implants with hearing impairment   Called EMS for CP last week, EKG was normal so pt did not wish to go to hospital.  Has seen PCP, and pulmonologist Pt had a nodule noted on thyroid. Can make herself a meal   Seen in PCP office , c/o SOB, had elevated D dimer, sento to hospital for CTA which was negative but had incidental finding of thyroid nodule, now scheduled for thyroid US   Was driving prior to CVA  Lives alone in her own home   Hx of Bilateral knee replacements and bilateral reverse shoulder replacements  Followed by Neuro PTA for Menieres disease  Also with hx of Fibromyalgia Pain Inventory Average Pain 9 Pain Right Now 7 My pain is sharp and pressure  LOCATION OF PAIN  back  BOWEL Number of stools per week: 2-3 Oral laxative use Yes  Type of laxative senakot, OTC stool softner Enema or suppository use No  History of colostomy No  Incontinent No   BLADDER Normal In and out cath, frequency . Able to self cath  . Bladder incontinence No  Frequent urination No  Leakage with coughing No  Difficulty starting stream No  Incomplete bladder emptying No    Mobility use a walker how many minutes can you walk? 3-5 ability to climb steps?  yes do you drive?  no use a wheelchair  Function retired I need assistance with the following:  shopping  Neuro/Psych weakness numbness tingling trouble walking dizziness  Prior Studies Any changes since last visit?  no  Physicians involved in your care Any changes since last visit?  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   Right-handed.   Rare caffeine use.   Social Determinants of Health   Financial  Resource Strain: Not on file  Food Insecurity: No Food Insecurity (10/09/2022)   Hunger Vital Sign    Worried About Running Out of Food in the Last Year: Never true    Ran Out of Food in the Last Year: Never true  Transportation Needs: Unmet Transportation Needs (10/09/2022)   PRAPARE - Hydrologist (Medical): Yes    Lack of Transportation (Non-Medical): Yes  Physical Activity: Not on file  Stress: Not on file  Social Connections: Not on file   Past Surgical History:  Procedure Laterality Date   [place]   bladder tack  2007   COCHLEAR IMPLANT     2007   COLONOSCOPY     COLONOSCOPY     HAMMER TOE SURGERY  2014   left foot   REVERSE SHOULDER ARTHROPLASTY Right 12/13/2014   dr Tamera Punt   REVERSE SHOULDER ARTHROPLASTY Right 12/13/2014   Procedure: REVERSE SHOULDER ARTHROPLASTY rt;  Surgeon: Nita Sells,  MD;  Location: [facility];  Service: Orthopedics;  Laterality: Right;  Right reverse total shoulder replacement   REVERSE SHOULDER ARTHROPLASTY Left 01/07/2017   Procedure: REVERSE LEFT TOTAL SHOULDER ARTHROPLASTY;  Surgeon: Tania Ade, MD;  Location: [place];  Service: Orthopedics;  Laterality: Left;  Left reveres total shoulder arthroplasty   TOTAL KNEE ARTHROPLASTY Right 12/16/2015   Procedure: TOTAL KNEE ARTHROPLASTY;  Surgeon: Elsie Saas, MD;  Location: [place];  Service: Orthopedics;  Laterality: Right;   TOTAL KNEE ARTHROPLASTY Left 11/28/2018   Procedure: LEFT TOTAL KNEE ARTHROPLASTY;  Surgeon: Elsie Saas, MD;  Location: [place];  Service: Orthopedics;  Laterality: Left;   Past Medical History:  Diagnosis Date   Acute blood loss anemia    hx   Anxiety    Arthritis    Balance problem    Bipolar disorder (HCC)    Cochlear implant in place    Cochlear implant in place    right ear   Depression    Dermatitis    on face   Dyspnea    Fibromyalgia    GERD (gastroesophageal reflux disease)    Hard of hearing    Hearing aid worn    left ear   Hearing impaired    has cochlear implant   HTN (hypertension)    Hypokalemia    Meniere disease    takes HCTZ for her inner ear problem   Mixed hyperlipidemia 09/27/2013   Osteoporosis    Pneumonia 11/2016   Pre-diabetes    Primary localized osteoarthritis of left knee 11/16/2018   Primary localized osteoarthritis of right knee    Slow transit constipation    Unsteady gait    BP (!) 145/73   Pulse 84   LMP  (LMP Unknown)   SpO2 96%   Opioid Risk Score:   Fall Risk Score:  `1  Depression screen [facility] 2/9     11/03/2022    1:41 PM  Depression screen PHQ 2/9  Decreased Interest 0  Down, Depressed, Hopeless 0  PHQ - 2 Score 0  Altered sleeping 0  Tired, decreased energy 0  Change in appetite 0  Feeling bad or failure about yourself  0  Trouble concentrating 0  Moving slowly or fidgety/restless 0  Suicidal thoughts 0  PHQ-9  Score 0  Difficult doing work/chores Not difficult at all      Review of Systems  Musculoskeletal:  Positive for back pain and gait problem.  Neurological:  Positive for dizziness, weakness  and numbness.  All other systems reviewed and are negative.     Objective:   Physical Exam  Patient with limited abduction and forward flexion of both shoulders. She has 4/5 strength bilateral deltoid bicep tricep grip as well as hip flexors knee extensors ankle dorsiflexors Speech has decreased enunciation, no evidence of aphasia. Severe hearing deficit needs to lip read. Tone is normal bilateral upper and lower limbs Extraocular muscles are intact Visual fields are intact confrontation testing There is no evidence of nystagmus Ambulates without assistive device she goes slowly with a widened base of support.      Assessment & Plan:   1.  History of strokelike syndrome no clear-cut signs on CT, did receive TNK.  She feels like she is back to her functional baseline with the exception of increased fatigue or decreased endurance. She should improve with continued home health therapy followed by going back to her community-based exercise program. She would like to go back to driving and I recommended that she discuss this with neurology during her appointment on 12/29/2022.  No physical medicine rehab follow-up

## 2022-12-11 NOTE — Patient Instructions (Signed)
Please ask Dr [name] about driving clearance

## 2022-12-15 DIAGNOSIS — M797 Fibromyalgia: Secondary | ICD-10-CM | POA: Diagnosis not present

## 2022-12-15 DIAGNOSIS — I69328 Other speech and language deficits following cerebral infarction: Secondary | ICD-10-CM | POA: Diagnosis not present

## 2022-12-15 DIAGNOSIS — I700 Atherosclerosis of aorta: Secondary | ICD-10-CM | POA: Diagnosis not present

## 2022-12-15 DIAGNOSIS — H532 Diplopia: Secondary | ICD-10-CM | POA: Diagnosis not present

## 2022-12-15 DIAGNOSIS — I69398 Other sequelae of cerebral infarction: Secondary | ICD-10-CM | POA: Diagnosis not present

## 2022-12-15 DIAGNOSIS — D649 Anemia, unspecified: Secondary | ICD-10-CM | POA: Diagnosis not present

## 2022-12-15 DIAGNOSIS — M810 Age-related osteoporosis without current pathological fracture: Secondary | ICD-10-CM | POA: Diagnosis not present

## 2022-12-15 DIAGNOSIS — I10 Essential (primary) hypertension: Secondary | ICD-10-CM | POA: Diagnosis not present

## 2022-12-15 DIAGNOSIS — H8109 Meniere's disease, unspecified ear: Secondary | ICD-10-CM | POA: Diagnosis not present

## 2022-12-15 DIAGNOSIS — R2689 Other abnormalities of gait and mobility: Secondary | ICD-10-CM | POA: Diagnosis not present

## 2022-12-15 DIAGNOSIS — H9190 Unspecified hearing loss, unspecified ear: Secondary | ICD-10-CM | POA: Diagnosis not present

## 2022-12-15 DIAGNOSIS — H538 Other visual disturbances: Secondary | ICD-10-CM | POA: Diagnosis not present

## 2022-12-15 DIAGNOSIS — M6281 Muscle weakness (generalized): Secondary | ICD-10-CM | POA: Diagnosis not present

## 2022-12-15 DIAGNOSIS — R519 Headache, unspecified: Secondary | ICD-10-CM | POA: Diagnosis not present

## 2022-12-15 DIAGNOSIS — E871 Hypo-osmolality and hyponatremia: Secondary | ICD-10-CM | POA: Diagnosis not present

## 2022-12-15 DIAGNOSIS — E1140 Type 2 diabetes mellitus with diabetic neuropathy, unspecified: Secondary | ICD-10-CM | POA: Diagnosis not present

## 2022-12-15 NOTE — Progress Notes (Signed)
Called and spoke with patient, advised of results/recommendations per Tammy Parrett NP.  She verbalized understanding.  Nothing further needed.

## 2022-12-16 ENCOUNTER — Ambulatory Visit: Payer: Medicare Other | Admitting: Emergency Medicine

## 2022-12-19 DIAGNOSIS — I69328 Other speech and language deficits following cerebral infarction: Secondary | ICD-10-CM | POA: Diagnosis not present

## 2022-12-19 DIAGNOSIS — H8109 Meniere's disease, unspecified ear: Secondary | ICD-10-CM | POA: Diagnosis not present

## 2022-12-19 DIAGNOSIS — H9190 Unspecified hearing loss, unspecified ear: Secondary | ICD-10-CM | POA: Diagnosis not present

## 2022-12-19 DIAGNOSIS — E1140 Type 2 diabetes mellitus with diabetic neuropathy, unspecified: Secondary | ICD-10-CM | POA: Diagnosis not present

## 2022-12-19 DIAGNOSIS — H532 Diplopia: Secondary | ICD-10-CM | POA: Diagnosis not present

## 2022-12-19 DIAGNOSIS — R519 Headache, unspecified: Secondary | ICD-10-CM | POA: Diagnosis not present

## 2022-12-19 DIAGNOSIS — M6281 Muscle weakness (generalized): Secondary | ICD-10-CM | POA: Diagnosis not present

## 2022-12-19 DIAGNOSIS — M810 Age-related osteoporosis without current pathological fracture: Secondary | ICD-10-CM | POA: Diagnosis not present

## 2022-12-19 DIAGNOSIS — I69398 Other sequelae of cerebral infarction: Secondary | ICD-10-CM | POA: Diagnosis not present

## 2022-12-19 DIAGNOSIS — I10 Essential (primary) hypertension: Secondary | ICD-10-CM | POA: Diagnosis not present

## 2022-12-19 DIAGNOSIS — R2689 Other abnormalities of gait and mobility: Secondary | ICD-10-CM | POA: Diagnosis not present

## 2022-12-19 DIAGNOSIS — I700 Atherosclerosis of aorta: Secondary | ICD-10-CM | POA: Diagnosis not present

## 2022-12-19 DIAGNOSIS — M797 Fibromyalgia: Secondary | ICD-10-CM | POA: Diagnosis not present

## 2022-12-19 DIAGNOSIS — D649 Anemia, unspecified: Secondary | ICD-10-CM | POA: Diagnosis not present

## 2022-12-19 DIAGNOSIS — H538 Other visual disturbances: Secondary | ICD-10-CM | POA: Diagnosis not present

## 2022-12-19 DIAGNOSIS — E871 Hypo-osmolality and hyponatremia: Secondary | ICD-10-CM | POA: Diagnosis not present

## 2022-12-29 ENCOUNTER — Encounter: Payer: Self-pay | Admitting: Neurology

## 2022-12-29 ENCOUNTER — Ambulatory Visit: Payer: Medicare Other | Admitting: Neurology

## 2022-12-29 VITALS — BP 167/70 | HR 78

## 2022-12-29 DIAGNOSIS — R269 Unspecified abnormalities of gait and mobility: Secondary | ICD-10-CM

## 2022-12-29 DIAGNOSIS — I639 Cerebral infarction, unspecified: Secondary | ICD-10-CM

## 2022-12-29 NOTE — Progress Notes (Signed)
Chief Complaint  Patient presents with   New Patient (Initial Visit)    Rm 12. With son  NX Elizabeth Stone 2021/Hospital f/u/Saw Dr. [name], can C/o numbness in face, both feet and swelling in left leg       ASSESSMENT AND PLAN  Elizabeth Stone is a 77 y.o. female   Strokelike symptoms on October 07, 2022 Slow worsening gait abnormality  Status post IV TNK, CT head showed no acute abnormality, CT angiogram of head and neck showed no large vessel disease,  She presenting with vertigo, dizzy, headache, longstanding history of Mnire's disease, with significant bilateral sensorineural hearing loss, differentiation diagnoses also include recurrent Mnire disease, migraine variant,  She is now back to her baseline,  Long history of slow worsening gait abnormality, likely related to her bilateral vestibular malfunction, previous evaluation for hyperreflexia: CT cervical/thoracic myelogram in 2020 multilevel degenerative changes noted, no significant canal foraminal stenosis   Continue aspirin 81 mg, encouraged her moderate exercise,     DIAGNOSTIC DATA (LABS, IMAGING, TESTING) - I reviewed patient records, labs, notes, testing and imaging myself where available.   MEDICAL HISTORY:  Elizabeth Stone is a 77 year old female, seen in request by her primary care physician Dr. Olen Pel, [facility] for evaluation of balance issues, initial evaluation was on 10/07/2020.   I reviewed and summarized the referring note. 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   She began to notice gradual onset gait abnormality since 2018, also concurrent with her worsening low back pain, radiating pain to bilateral lower extremity   Now she complains of balance difficulty, can only walk short distance without assistant from parking lot to the front desk, with prolonged walking, she complains of low back pain, bilateral lower extremity heavy sensation, " as if my back is carrying 500 pounds", she denies persistent bilateral lower extremity paresthesia, denies bowel bladder incontinence, her low back pain is about 8 out of 10, constant, improved by epidural injection periodically   I personally reviewed CT myelogram of cervical spine in October 2020: Multilevel degenerative spondylosis, and facet arthropathy throughout the cervical spine and upper thoracic region, no compressive canal stenosis, variable degree of foraminal narrowing   CT myelogram of lumbar spine in June 2019, multilevel degenerative changes throughout the lumbar spine, severe right and moderate left neural foraminal stenosis L5-S1, 3 mm anterolisthesis at L5-S1,    UPDATE December 29 2022: She is here to follow-up hospital admission for strokelike symptoms on October 07, 2022, complains acute onset of vertigo, double vision, headache,  She was given TNK, symptoms last 5 to 6 days, eventually resolved, not MRI candidate due to previous cochlear implant, personally reviewed CT head, no acute abnormality, CT angiogram of head and neck showed no large vessel disease, echocardiogram showed normal ejection fraction, LDL 77, A1c 5.4,  Since hospital admission, she was started on aspirin 81 mg daily, back to baseline, she lives alone at home, hope to resume her driving so she can go to gym workout  She continue complains of gait abnormality, which I saw her previously in 2022, extensive workup, EMG study showed no evidence of peripheral neuropathy, CT  cervical spine in May 2022 showed mild degenerative changes, no evidence of significant canal foraminal stenosis  Consider her gait abnormality unlikely due to vestibular dysfunction from long history of severe Mnire's disease with bilateral sensorineural hearing loss,  She complains of worsening gait abnormality over the years, using walker now for longer distance, denies bowel or bladder incontinence, denies significant neck pain    PHYSICAL EXAM:   Vitals:   12/29/22 1615  BP: (!) 167/70  Pulse: 78    PHYSICAL EXAMNIATION:  Gen: NAD, conversant, well nourised, well groomed                     Cardiovascular: Regular rate rhythm, no peripheral edema, warm, nontender. Eyes: Conjunctivae clear without exudates or hemorrhage Neck: Supple, no carotid bruits. Pulmonary: Clear to auscultation bilaterally   NEUROLOGICAL EXAM:  MENTAL STATUS: Speech/cognition: Awake, alert, provide good history, hard of hearing, mild slurred speech typical for people with severe hearing loss CRANIAL NERVES: CN II: Visual fields are full to confrontation. Pupils are round equal and briskly reactive to light. CN III, IV, VI: extraocular movement are normal. No ptosis. CN V: Facial sensation is intact to light touch CN VII: Face is symmetric with normal eye closure  CN VIII: Wearing bilateral hearing aid, CN IX, X: Phonation is normal. CN XI: Head turning and shoulder shrug are intact  MOTOR: There is no pronator drift of out-stretched arms. Muscle bulk and tone are normal. Muscle strength is normal.  REFLEXES: Reflexes are 2+ and symmetric at the biceps, triceps, knees, and trace at ankles. Plantar responses are flexor.  SENSORY: Intact to light touch, pinprick and vibratory sensation are intact in fingers and toes.  Preserved toe proprioception  COORDINATION: There is no trunk or limb dysmetria noted.  GAIT/STANCE: Need push-up to get up from seated position, wide-based, cautious, positive  Romberg sign   REVIEW OF SYSTEMS:  Full 14 system review of systems performed and notable only for as above All other review of systems were negative.   ALLERGIES: Allergies  Allergen Reactions   Latex Hives and Rash    Tape   (No problems with gloves)   Adhesive [Tape] Rash and Other (See Comments)    PT STATED ALLERGY TO TAPE, NOT LATEX GLOVES AS PREVIOUSLY DOCUMENTED (Pt prefers paper tape.)    HOME MEDICATIONS: Current Outpatient Medications  Medication Sig Dispense Refill   acetaminophen (TYLENOL) 325 MG tablet Take 1 tablet (325 mg total) by mouth every 4 (four) hours as needed for mild pain (or temp > 37.5 C (99.5 F)).     albuterol (VENTOLIN HFA) 108 (90 Base) MCG/ACT inhaler Inhale 1-2 puffs into the lungs every 6 (six) hours as needed. 8 g 2   amLODipine (NORVASC) 5 MG tablet Take 1 tablet (5 mg total) by mouth daily. 30 tablet 0   Ascorbic Acid (VITAMIN C ER PO) Vitamin C     Asenapine Maleate 10 MG SUBL Place 1 tablet under the tongue at bedtime.     aspirin EC 81 MG tablet Take 1 tablet (81 mg total) by mouth daily. Swallow whole. 30 tablet 12   atorvastatin (LIPITOR) 20 MG tablet Take 1 tablet (20 mg total) by mouth at bedtime. 30 tablet 0   B Complex-C (B-COMPLEX WITH VITAMIN C) tablet Take 1 tablet by mouth daily. 30 tablet  0   buPROPion (WELLBUTRIN XL) 300 MG 24 hr tablet Take 1 tablet (300 mg total) by mouth daily. 30 tablet 0   Celecoxib (CELEBREX PO) CeleBREX     cetirizine (ZYRTEC) 10 MG chewable tablet Chew 10 mg by mouth at bedtime.     CHLORTHALIDONE PO Chlorthalidone     clonazePAM (KLONOPIN) 1 MG tablet Take 1 tablet (1 mg total) by mouth at bedtime. 30 tablet 0   Dermatological Products, Misc. (DERMAREST ROSACEA EX) Apply topically Once daily     DICLOFENAC POTASSIUM PO Diclofenac Potassium     DULoxetine (CYMBALTA) 60 MG capsule Take 1 capsule (60 mg total) by mouth 2 (two) times daily. 60 capsule 0   esomeprazole (NEXIUM) 20 MG capsule Take 1 capsule  (20 mg total) by mouth daily. 30 capsule 0   fluocinonide ointment (LIDEX) 0.05 % APPLY TWICE DAILY FOR 14 DAYS.     gabapentin (NEURONTIN) 300 MG capsule Take 1 capsule (300 mg total) by mouth at bedtime. 30 capsule 0   hydrochlorothiazide (HYDRODIURIL) 25 MG tablet Take 1 tablet (25 mg total) by mouth daily. 30 tablet 0   HYDROcodone-acetaminophen (NORCO) 10-325 MG tablet Take 1 tablet by mouth every 12 (twelve) hours as needed for severe pain. 30 tablet 0   ibuprofen (ADVIL) 600 MG tablet 1 tablet with food or milk as needed Orally Three times a day     lamoTRIgine (LAMICTAL) 150 MG tablet Take 2 tablets (300 mg total) by mouth daily. 60 tablet 0   lamoTRIgine (LAMICTAL) 25 MG tablet 1 tablet Orally for 30 day(s)     losartan (COZAAR) 100 MG tablet Take 1 tablet (100 mg total) by mouth daily. 30 tablet 0   magnesium oxide (MAG-OX) 400 MG tablet Take 0.5 tablets (200 mg total) by mouth at bedtime. 15 tablet 0   meloxicam (MOBIC) 15 MG tablet Take 15 mg by mouth daily as needed.     metFORMIN (GLUCOPHAGE-XR) 750 MG 24 hr tablet Take 1 tablet (750 mg total) by mouth every evening. 30 tablet 0   Minoxidil (ROGAINE MENS EXTRA STRENGTH) 5 % FOAM as directed Externally     montelukast (SINGULAIR) 10 MG tablet Take 1 tablet (10 mg total) by mouth at bedtime. 30 tablet 0   PANTOPRAZOLE SODIUM PO Pantoprazole Sodium     potassium chloride SA (KLOR-CON M) 20 MEQ tablet Take 20 mEq by mouth daily.     Spacer/Aero-Holding Chambers DEVI 1 each by Does not apply route as needed. 1 each 0   Sulfacetamide Sodium 10 % LIQD 1 application Externally Once a day     traZODone (DESYREL) 50 MG tablet Take 1 tablet (50 mg total) by mouth at bedtime. 30 tablet 0   VITAMIN D PO Take 1,000 Units by mouth daily.     Vitamin D, Ergocalciferol, (DRISDOL) 1.25 MG (50000 UNIT) CAPS capsule Take 1 capsule (50,000 Units total) by mouth every 7 (seven) days. 5 capsule 0   No current facility-administered medications for this  visit.    PAST MEDICAL HISTORY: Past Medical History:  Diagnosis Date   Acute blood loss anemia    hx   Anxiety    Arthritis    Balance problem    Bipolar disorder ([place])    Cochlear implant in place    Cochlear implant in place    right ear   Depression    Dermatitis    on face   Dyspnea    Fibromyalgia    GERD (gastroesophageal  reflux disease)    Hard of hearing    Hearing aid worn    left ear   Hearing impaired    has cochlear implant   HTN (hypertension)    Hypokalemia    Meniere disease    takes HCTZ for her inner ear problem   Mixed hyperlipidemia 09/27/2013   Osteoporosis    Pneumonia 11/2016   Pre-diabetes    Primary localized osteoarthritis of left knee 11/16/2018   Primary localized osteoarthritis of right knee    Slow transit constipation    Unsteady gait     PAST SURGICAL HISTORY: Past Surgical History:  Procedure Laterality Date   ABDOMINAL HYSTERECTOMY  1986   bladder tack  2007   COCHLEAR IMPLANT     2007   COLONOSCOPY     COLONOSCOPY     HAMMER TOE SURGERY  2014   left foot   REVERSE SHOULDER ARTHROPLASTY Right 12/13/2014   dr Tamera Punt   REVERSE SHOULDER ARTHROPLASTY Right 12/13/2014   Procedure: REVERSE SHOULDER ARTHROPLASTY rt;  Surgeon: Nita Sells, MD;  Location: [facility];  Service: Orthopedics;  Laterality: Right;  Right reverse total shoulder replacement   REVERSE SHOULDER ARTHROPLASTY Left 01/07/2017   Procedure: REVERSE LEFT TOTAL SHOULDER ARTHROPLASTY;  Surgeon: Tania Ade, MD;  Location: [facility];  Service: Orthopedics;  Laterality: Left;  Left reveres total shoulder arthroplasty   TOTAL KNEE ARTHROPLASTY Right 12/16/2015   Procedure: TOTAL KNEE ARTHROPLASTY;  Surgeon: Elsie Saas, MD;  Location: [facility];  Service: Orthopedics;  Laterality: Right;   TOTAL KNEE ARTHROPLASTY Left 11/28/2018   Procedure: LEFT TOTAL KNEE ARTHROPLASTY;  Surgeon: Elsie Saas, MD;  Location: [facility];  Service: Orthopedics;  Laterality: Left;     FAMILY HISTORY: Family History  Problem Relation Age of Onset   Heart disease Mother    Breast cancer Mother    Cervical cancer Mother    Diabetes Mother    Heart disease Father    Diabetes Maternal Aunt    Colon cancer Neg Hx    Esophageal cancer Neg Hx    Rectal cancer Neg Hx    Stomach cancer Neg Hx    Liver cancer Neg Hx    Pancreatic cancer Neg Hx     SOCIAL HISTORY: Social History   Socioeconomic History   Marital status: Widowed    Spouse name: Not on file   Number of children: 2   Years of education: college   Highest education level: Not on file  Occupational History   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   Right-handed.   Rare caffeine use.   Social Determinants of Health   Financial Resource Strain: Not on file  Food Insecurity: No Food Insecurity (10/09/2022)   Hunger Vital Sign    Worried About Running Out of Food in the Last Year: Never true    Ran Out of Food in the  Last Year: Never true  Transportation Needs: Unmet Transportation Needs (10/09/2022)   PRAPARE - Hydrologist (Medical): Yes    Lack of Transportation (Non-Medical): Yes  Physical Activity: Not on file  Stress: Not on file  Social Connections: Not on file  Intimate Partner Violence: Not At Risk (10/09/2022)   Humiliation, Afraid, Rape, and Kick questionnaire    Fear of Current or Ex-Partner: No    Emotionally Abused: No    Physically Abused: No    Sexually Abused: No      Marcial Pacas, M.D.  Ph.D.  [facility] [address] [place], [place] [address] Ph: [phone] Fax: [phone]  CC:  Rosalin Hawking, MD [address],  [place] [id number]  Orpah Melter, MD

## 2022-12-30 DIAGNOSIS — M48061 Spinal stenosis, lumbar region without neurogenic claudication: Secondary | ICD-10-CM | POA: Diagnosis not present

## 2022-12-31 ENCOUNTER — Ambulatory Visit
Admission: RE | Admit: 2022-12-31 | Discharge: 2022-12-31 | Disposition: A | Discharge status: Home / Self Care | Payer: Medicare Other | Source: Ambulatory Visit | Attending: Family Medicine | Admitting: Family Medicine

## 2022-12-31 DIAGNOSIS — E041 Nontoxic single thyroid nodule: Secondary | ICD-10-CM

## 2023-02-02 DIAGNOSIS — D23122 Other benign neoplasm of skin of left lower eyelid, including canthus: Secondary | ICD-10-CM | POA: Diagnosis not present

## 2023-02-09 DIAGNOSIS — E119 Type 2 diabetes mellitus without complications: Secondary | ICD-10-CM | POA: Diagnosis not present

## 2023-02-11 DIAGNOSIS — E209 Hypoparathyroidism, unspecified: Secondary | ICD-10-CM | POA: Diagnosis not present

## 2023-02-11 DIAGNOSIS — M545 Low back pain, unspecified: Secondary | ICD-10-CM | POA: Diagnosis not present

## 2023-02-11 DIAGNOSIS — Z7409 Other reduced mobility: Secondary | ICD-10-CM | POA: Diagnosis not present

## 2023-02-11 DIAGNOSIS — Z8673 Personal history of transient ischemic attack (TIA), and cerebral infarction without residual deficits: Secondary | ICD-10-CM | POA: Diagnosis not present

## 2023-02-15 DIAGNOSIS — E119 Type 2 diabetes mellitus without complications: Secondary | ICD-10-CM | POA: Diagnosis not present

## 2023-02-18 DIAGNOSIS — M5416 Radiculopathy, lumbar region: Secondary | ICD-10-CM | POA: Diagnosis not present

## 2023-02-19 ENCOUNTER — Ambulatory Visit: Payer: Medicare Other | Admitting: Gastroenterology

## 2023-02-23 DIAGNOSIS — F3181 Bipolar II disorder: Secondary | ICD-10-CM | POA: Diagnosis not present

## 2023-03-02 DIAGNOSIS — H903 Sensorineural hearing loss, bilateral: Secondary | ICD-10-CM | POA: Diagnosis not present

## 2023-03-23 DIAGNOSIS — M48061 Spinal stenosis, lumbar region without neurogenic claudication: Secondary | ICD-10-CM | POA: Diagnosis not present

## 2023-03-23 DIAGNOSIS — M533 Sacrococcygeal disorders, not elsewhere classified: Secondary | ICD-10-CM | POA: Diagnosis not present

## 2023-04-14 DIAGNOSIS — L821 Other seborrheic keratosis: Secondary | ICD-10-CM | POA: Diagnosis not present

## 2023-04-14 DIAGNOSIS — L578 Other skin changes due to chronic exposure to nonionizing radiation: Secondary | ICD-10-CM | POA: Diagnosis not present

## 2023-04-14 DIAGNOSIS — L820 Inflamed seborrheic keratosis: Secondary | ICD-10-CM | POA: Diagnosis not present

## 2023-04-14 DIAGNOSIS — D225 Melanocytic nevi of trunk: Secondary | ICD-10-CM | POA: Diagnosis not present

## 2023-04-14 DIAGNOSIS — L570 Actinic keratosis: Secondary | ICD-10-CM | POA: Diagnosis not present

## 2023-04-19 DEATH — deceased

## 2023-04-27 DIAGNOSIS — Z8262 Family history of osteoporosis: Secondary | ICD-10-CM | POA: Diagnosis not present

## 2023-04-27 DIAGNOSIS — Z1231 Encounter for screening mammogram for malignant neoplasm of breast: Secondary | ICD-10-CM | POA: Diagnosis not present

## 2023-04-27 DIAGNOSIS — M8588 Other specified disorders of bone density and structure, other site: Secondary | ICD-10-CM | POA: Diagnosis not present

## 2023-05-12 DIAGNOSIS — Z0000 Encounter for general adult medical examination without abnormal findings: Secondary | ICD-10-CM | POA: Diagnosis not present

## 2023-05-12 DIAGNOSIS — I700 Atherosclerosis of aorta: Secondary | ICD-10-CM | POA: Diagnosis not present

## 2023-05-12 DIAGNOSIS — I10 Essential (primary) hypertension: Secondary | ICD-10-CM | POA: Diagnosis not present

## 2023-05-12 DIAGNOSIS — F319 Bipolar disorder, unspecified: Secondary | ICD-10-CM | POA: Diagnosis not present

## 2023-05-12 DIAGNOSIS — E113392 Type 2 diabetes mellitus with moderate nonproliferative diabetic retinopathy without macular edema, left eye: Secondary | ICD-10-CM | POA: Diagnosis not present

## 2023-05-19 DIAGNOSIS — Z79899 Other long term (current) drug therapy: Secondary | ICD-10-CM | POA: Diagnosis not present

## 2023-05-19 DIAGNOSIS — R0609 Other forms of dyspnea: Secondary | ICD-10-CM | POA: Diagnosis not present

## 2023-05-19 DIAGNOSIS — M810 Age-related osteoporosis without current pathological fracture: Secondary | ICD-10-CM | POA: Diagnosis not present

## 2023-05-19 DIAGNOSIS — E782 Mixed hyperlipidemia: Secondary | ICD-10-CM | POA: Diagnosis not present

## 2023-06-03 ENCOUNTER — Ambulatory Visit: Payer: Medicare Other | Admitting: Nurse Practitioner

## 2023-06-10 DIAGNOSIS — M791 Myalgia, unspecified site: Secondary | ICD-10-CM | POA: Diagnosis not present

## 2023-06-15 ENCOUNTER — Other Ambulatory Visit (HOSPITAL_COMMUNITY): Payer: Self-pay

## 2023-06-15 MED ORDER — MOLNUPIRAVIR 200 MG PO CAPS
ORAL_CAPSULE | ORAL | 0 refills | Status: DC
  Filled 2023-06-15: qty 40, 5d supply, fill #0

## 2023-06-23 DIAGNOSIS — E119 Type 2 diabetes mellitus without complications: Secondary | ICD-10-CM | POA: Diagnosis not present

## 2023-06-24 DIAGNOSIS — E119 Type 2 diabetes mellitus without complications: Secondary | ICD-10-CM | POA: Diagnosis not present

## 2023-06-24 DIAGNOSIS — S81811A Laceration without foreign body, right lower leg, initial encounter: Secondary | ICD-10-CM | POA: Diagnosis not present

## 2023-06-24 DIAGNOSIS — M810 Age-related osteoporosis without current pathological fracture: Secondary | ICD-10-CM | POA: Diagnosis not present

## 2023-07-02 DIAGNOSIS — R0781 Pleurodynia: Secondary | ICD-10-CM | POA: Diagnosis not present

## 2023-08-18 DIAGNOSIS — Z79899 Other long term (current) drug therapy: Secondary | ICD-10-CM | POA: Diagnosis not present

## 2023-08-18 DIAGNOSIS — I10 Essential (primary) hypertension: Secondary | ICD-10-CM | POA: Diagnosis not present

## 2023-08-18 DIAGNOSIS — R600 Localized edema: Secondary | ICD-10-CM | POA: Diagnosis not present

## 2023-08-18 DIAGNOSIS — R0602 Shortness of breath: Secondary | ICD-10-CM | POA: Diagnosis not present

## 2023-08-19 DIAGNOSIS — D649 Anemia, unspecified: Secondary | ICD-10-CM | POA: Diagnosis not present

## 2023-08-23 DIAGNOSIS — R0602 Shortness of breath: Secondary | ICD-10-CM | POA: Diagnosis not present

## 2023-08-23 DIAGNOSIS — E611 Iron deficiency: Secondary | ICD-10-CM | POA: Diagnosis not present

## 2023-08-26 ENCOUNTER — Ambulatory Visit: Payer: Medicare Other | Admitting: Nurse Practitioner

## 2023-08-26 ENCOUNTER — Encounter: Payer: Self-pay | Admitting: Nurse Practitioner

## 2023-08-26 ENCOUNTER — Other Ambulatory Visit: Payer: Medicare Other

## 2023-08-26 VITALS — BP 120/58 | HR 94 | Ht 61.0 in | Wt 168.6 lb

## 2023-08-26 DIAGNOSIS — D649 Anemia, unspecified: Secondary | ICD-10-CM | POA: Diagnosis not present

## 2023-08-26 DIAGNOSIS — Z860100 Personal history of colon polyps, unspecified: Secondary | ICD-10-CM | POA: Diagnosis not present

## 2023-08-26 LAB — IBC + FERRITIN
Ferritin: 22.5 ng/mL (ref 10.0–291.0)
Iron: 305 ug/dL — ABNORMAL HIGH (ref 42–145)
Saturation Ratios: 74.4 % — ABNORMAL HIGH (ref 20.0–50.0)
TIBC: 410.2 ug/dL (ref 250.0–450.0)
Transferrin: 293 mg/dL (ref 212.0–360.0)

## 2023-08-26 LAB — COMPREHENSIVE METABOLIC PANEL
ALT: 24 U/L (ref 0–35)
AST: 32 U/L (ref 0–37)
Albumin: 4.1 g/dL (ref 3.5–5.2)
Alkaline Phosphatase: 104 U/L (ref 39–117)
BUN: 9 mg/dL (ref 6–23)
CO2: 27 meq/L (ref 19–32)
Calcium: 9.5 mg/dL (ref 8.4–10.5)
Chloride: 99 meq/L (ref 96–112)
Creatinine, Ser: 0.58 mg/dL (ref 0.40–1.20)
GFR: 88.73 mL/min (ref >60.00)
Glucose, Bld: 135 mg/dL — ABNORMAL HIGH (ref 70–99)
Potassium: 3.8 meq/L (ref 3.5–5.1)
Sodium: 132 meq/L — ABNORMAL LOW (ref 135–145)
Total Bilirubin: 0.5 mg/dL (ref 0.2–1.2)
Total Protein: 7.1 g/dL (ref 6.0–8.3)

## 2023-08-26 LAB — CBC WITH DIFFERENTIAL/PLATELET
Basophils Absolute: 0 10*3/uL (ref 0.0–0.1)
Basophils Relative: 0.6 % (ref 0.0–3.0)
Eosinophils Absolute: 0.1 10*3/uL (ref 0.0–0.7)
Eosinophils Relative: 1 % (ref 0.0–5.0)
HCT: 35.6 % — ABNORMAL LOW (ref 36.0–46.0)
Hemoglobin: 11.6 g/dL — ABNORMAL LOW (ref 12.0–15.0)
Lymphocytes Relative: 22.4 % (ref 12.0–46.0)
Lymphs Abs: 1.3 10*3/uL (ref 0.7–4.0)
MCHC: 32.6 g/dL (ref 30.0–36.0)
MCV: 84.7 fL (ref 78.0–100.0)
Monocytes Absolute: 0.8 10*3/uL (ref 0.1–1.0)
Monocytes Relative: 13.9 % — ABNORMAL HIGH (ref 3.0–12.0)
Neutro Abs: 3.6 10*3/uL (ref 1.4–7.7)
Neutrophils Relative %: 62.1 % (ref 43.0–77.0)
Platelets: 199 10*3/uL (ref 150.0–400.0)
RBC: 4.21 Mil/uL (ref 3.87–5.11)
RDW: 17.5 % — ABNORMAL HIGH (ref 11.5–15.5)
WBC: 5.8 10*3/uL (ref 4.0–10.5)

## 2023-08-26 LAB — B12 AND FOLATE PANEL
Folate: 16.5 ng/mL (ref >5.9)
Vitamin B-12: 519 pg/mL (ref 211–911)

## 2023-08-26 NOTE — Progress Notes (Signed)
08/26/2023 Elizabeth Stone [id number] 1946-05-04   CHIEF COMPLAINT: Discuss scheduling a colonoscopy   HISTORY OF PRESENT ILLNESS: Elizabeth Stone is a 77 year old female with a past medical history of arthritis, anxiety, depression, bipolar disorder, fibromyalgia, hearing impaired status postcochlear implant, Mnire's disease, hypertension, hyperlipidemia, diabetes mellitus type 2, stroke symptoms s/p TNK 09/2022 and GERD.  She presents today to schedule a colonoscopy.  She stated she was recently diagnosed with iron deficiency anemia.  She endorses feeling fatigued and weak for the past 3 months but continues to go to the gym 3 days weekly for weightlifting and cardio exercises.  She denies having any nausea or vomiting.  No upper or lower abdominal pain.  No dysphagia or heartburn.  She recently started taking oral iron daily earlier this week which resulted in constipation.  She took a stool softener last night which resulted in 5 episodes of nonbloody watery diarrhea today.  She noted her stool is dark green to black since starting oral iron.  She takes ASA 81 mg daily.  No other NSAID use.  Colonoscopy 11/30/2017 identified 1 sessile serrated polyp and 1 hyperplastic polyp removed from the colon she was advised to repeat a colonoscopy in 5 years. She has noticed progressive shortness of breath and dyspnea with exertion over the past few months for which she used her albuterol inhaler without significant improvement.  She noticed significant swelling to her hands and legs with a 7 pound weight gain in 3 days. She was seen by her PCP Dr. Carlyon Shadow who discontinued Amlodipine prescribed Lasix p.o. and her edema significantly improved and her weight went back to baseline.  She weighs 168 pounds today.  No chest pain.  She was admitted to the hospital 10/07/2022 with strokelike symptoms, head CT was negative for intracranial bleed or other acute intracranial abnormality. CT angiogram of head and neck  showed no large vessel disease. S/P Tenecteplase (TNK). She was transferred to [facility] inpatient rehab 09/2023 and was subsequently discharged home 10/21/2022 on ASA 81mg  every day.  No GERD symptoms on Esomeprazole 20 mg daily.  She is not taking Protonix as listed on her medication profile. Patient presents without laboratory results from PCP which she reported showed iron deficiency anemia.       Latest Ref Rng & Units 10/16/2022    5:43 AM 10/15/2022    5:41 AM 10/14/2022    6:02 AM  CBC  WBC 4.0 - 10.5 K/uL 6.3  6.9  6.4   Hemoglobin 12.0 - 15.0 g/dL 40.9  81.1  91.4   Hematocrit 36.0 - 46.0 % 39.6  39.7  39.6   Platelets 150 - 400 K/uL 150  156  148        Latest Ref Rng & Units 11/30/2022    2:04 PM 10/16/2022    5:43 AM 10/15/2022    5:41 AM  CMP  Glucose 70 - 99 mg/dL 94  782  956   BUN 6 - 23 mg/dL 11  11  9    Creatinine 0.40 - 1.20 mg/dL 2.13  0.86  5.78   Sodium 135 - 145 mEq/L 133  131  131   Potassium 3.5 - 5.1 mEq/L 3.7  3.7  3.7   Chloride 96 - 112 mEq/L 98  96  96   CO2 19 - 32 mEq/L 25  26  26    Calcium 8.4 - 10.5 mg/dL 9.6  9.7  9.7     PAST GI  PROCEDURES:  Colonoscopy 11/30/2017: - The examined portion of the ileum was normal. - One 7 mm polyp in the ascending colon, removed with a cold snare. Resected and retrieved.  - One 4 mm polyp at the recto-sigmoid colon, removed with a cold snare. Resected and retrieved.  - Tortuous colon.  - The examination was otherwise normal on direct and retroflexion views - 5 year recall - SESSILE SERRATED POLYP (X3 FRAGMENTS). - HYPERPLASTIC POLYP. - NO DYSPLASIA OR MALIGNANCY.  Past Medical History:  Diagnosis Date   Acute blood loss anemia    hx   Anxiety    Arthritis    Balance problem    Bipolar disorder (HCC)    Cochlear implant in place    Cochlear implant in place    right ear   Depression    Dermatitis    on face   Dyspnea    Fibromyalgia    GERD (gastroesophageal reflux disease)    Hard of hearing     Hearing aid worn    left ear   Hearing impaired    has cochlear implant   HTN (hypertension)    Hypokalemia    Meniere disease    takes HCTZ for her inner ear problem   Mixed hyperlipidemia 09/27/2013   Osteoporosis    Pneumonia 11/2016   Pre-diabetes    Primary localized osteoarthritis of left knee 11/16/2018   Primary localized osteoarthritis of right knee    Slow transit constipation    Unsteady gait    Past Surgical History:  Procedure Laterality Date   ABDOMINAL HYSTERECTOMY  1986   bladder tack  2007   COCHLEAR IMPLANT     2007   COLONOSCOPY     COLONOSCOPY     HAMMER TOE SURGERY  2014   left foot   REVERSE SHOULDER ARTHROPLASTY Right 12/13/2014   dr Ave Filter   REVERSE SHOULDER ARTHROPLASTY Right 12/13/2014   Procedure: REVERSE SHOULDER ARTHROPLASTY rt;  Surgeon: Mable Paris, MD;  Location: [facility] OR;  Service: Orthopedics;  Laterality: Right;  Right reverse total shoulder replacement   REVERSE SHOULDER ARTHROPLASTY Left 01/07/2017   Procedure: REVERSE LEFT TOTAL SHOULDER ARTHROPLASTY;  Surgeon: Trabert Broom, MD;  Location: [facility] OR;  Service: Orthopedics;  Laterality: Left;  Left reveres total shoulder arthroplasty   TOTAL KNEE ARTHROPLASTY Right 12/16/2015   Procedure: TOTAL KNEE ARTHROPLASTY;  Surgeon: Salvatore Marvel, MD;  Location: [facility] OR;  Service: Orthopedics;  Laterality: Right;   TOTAL KNEE ARTHROPLASTY Left 11/28/2018   Procedure: LEFT TOTAL KNEE ARTHROPLASTY;  Surgeon: Salvatore Marvel, MD;  Location: [facility] OR;  Service: Orthopedics;  Laterality: Left;   Social History: She is widowed.  Retired.  She has 1 son and 1 daughter.  She reports that she quit smoking about 30 years ago. Her smoking use included cigarettes. She started smoking about 53 years ago. She has a 5.8 pack-year smoking history. She has never used smokeless tobacco. She reports current alcohol use, 2 drinks daily or less. No drug use.   Family History: Mother with history of heart disease,  diabetes, breast cancer and cervical cancer.  Had heart disease.  No known family history of esophageal, gastric or colorectal cancer.  Allergies  Allergen Reactions   Latex Hives and Rash    Tape   (No problems with gloves)   Adhesive [Tape] Rash and Other (See Comments)    PT STATED ALLERGY TO TAPE, NOT LATEX GLOVES AS PREVIOUSLY DOCUMENTED (Pt prefers paper tape.)  Outpatient Encounter Medications as of 08/26/2023  Medication Sig   acetaminophen (TYLENOL) 325 MG tablet Take 1 tablet (325 mg total) by mouth every 4 (four) hours as needed for mild pain (or temp > 37.5 C (99.5 F)).   albuterol (VENTOLIN HFA) 108 (90 Base) MCG/ACT inhaler Inhale 1-2 puffs into the lungs every 6 (six) hours as needed.   Ascorbic Acid (VITAMIN C ER PO) Vitamin C   Asenapine Maleate 10 MG SUBL Place 1 tablet under the tongue at bedtime.   aspirin EC 81 MG tablet Take 1 tablet (81 mg total) by mouth daily. Swallow whole.   atorvastatin (LIPITOR) 20 MG tablet Take 1 tablet (20 mg total) by mouth at bedtime.   B Complex-C (B-COMPLEX WITH VITAMIN C) tablet Take 1 tablet by mouth daily.   buPROPion (WELLBUTRIN XL) 300 MG 24 hr tablet Take 1 tablet (300 mg total) by mouth daily.   cetirizine (ZYRTEC) 10 MG chewable tablet Chew 10 mg by mouth at bedtime.   CHLORTHALIDONE PO Chlorthalidone   clonazePAM (KLONOPIN) 1 MG tablet Take 1 tablet (1 mg total) by mouth at bedtime.   Dermatological Products, Misc. (DERMAREST ROSACEA EX) Apply topically Once daily   DICLOFENAC POTASSIUM PO Diclofenac Potassium   DULoxetine (CYMBALTA) 60 MG capsule Take 1 capsule (60 mg total) by mouth 2 (two) times daily.   esomeprazole (NEXIUM) 20 MG capsule Take 1 capsule (20 mg total) by mouth daily.   fluocinonide ointment (LIDEX) 0.05 % APPLY TWICE DAILY FOR 14 DAYS.   gabapentin (NEURONTIN) 300 MG capsule Take 1 capsule (300 mg total) by mouth at bedtime.   HYDROcodone-acetaminophen (NORCO) 10-325 MG tablet Take 1 tablet by mouth  every 12 (twelve) hours as needed for severe pain.   ibuprofen (ADVIL) 600 MG tablet 1 tablet with food or milk as needed Orally Three times a day   lamoTRIgine (LAMICTAL) 150 MG tablet Take 2 tablets (300 mg total) by mouth daily.   lamoTRIgine (LAMICTAL) 25 MG tablet 1 tablet Orally for 30 day(s)   losartan (COZAAR) 100 MG tablet Take 1 tablet (100 mg total) by mouth daily.   metFORMIN (GLUCOPHAGE-XR) 750 MG 24 hr tablet Take 1 tablet (750 mg total) by mouth every evening.   molnupiravir EUA (LAGEVRIO) 200 MG CAPS capsule Take 4 capsules by mouth every 12 hours for 5 days   montelukast (SINGULAIR) 10 MG tablet Take 1 tablet (10 mg total) by mouth at bedtime.   potassium chloride SA (KLOR-CON M) 20 MEQ tablet Take 20 mEq by mouth daily.   Sulfacetamide Sodium 10 % LIQD 1 application Externally Once a day   VITAMIN D PO Take 1,000 Units by mouth daily.   Vitamin D, Ergocalciferol, (DRISDOL) 1.25 MG (50000 UNIT) CAPS capsule Take 1 capsule (50,000 Units total) by mouth every 7 (seven) days.   PANTOPRAZOLE SODIUM PO Pantoprazole Sodium   Spacer/Aero-Holding Chambers DEVI 1 each by Does not apply route as needed.   [DISCONTINUED] amLODipine (NORVASC) 5 MG tablet Take 1 tablet (5 mg total) by mouth daily.   [DISCONTINUED] Celecoxib (CELEBREX PO) CeleBREX   [DISCONTINUED] hydrochlorothiazide (HYDRODIURIL) 25 MG tablet Take 1 tablet (25 mg total) by mouth daily.   [DISCONTINUED] magnesium oxide (MAG-OX) 400 MG tablet Take 0.5 tablets (200 mg total) by mouth at bedtime.   [DISCONTINUED] meloxicam (MOBIC) 15 MG tablet Take 15 mg by mouth daily as needed.   [DISCONTINUED] Minoxidil (ROGAINE MENS EXTRA STRENGTH) 5 % FOAM as directed Externally   [DISCONTINUED] traZODone (DESYREL)  50 MG tablet Take 1 tablet (50 mg total) by mouth at bedtime.   No facility-administered encounter medications on file as of 08/26/2023.    REVIEW OF SYSTEMS:  Gen: Denies fever, sweats or chills. No weight loss.  CV: Denies  chest pain, palpitations or edema. Resp: + SOB, no hemoptysis.  GI: See HPI. GU: Denies urinary burning, blood in urine, increased urinary frequency or incontinence. MS: + Arthritis, gait abnormality, balance issues. Derm: Denies rash, itchiness, skin lesions or unhealing ulcers. Psych:+ Anxiety and depression. Heme: Denies bruising, easy bleeding. Neuro: + Mnire's disease, dizziness. Endo: + DM type II.  PHYSICAL EXAM: BP (!) 120/58 (BP Location: Right Arm, Patient Position: Sitting, Cuff Size: Normal)   Pulse 94   Ht 5\' 1"  (1.549 m)   Wt 168 lb 9 oz (76.5 kg)   LMP  (LMP Unknown)   SpO2 96%   BMI 31.85 kg/m  General: Pleasant 77 year old obese female presents in a wheelchair in no acute distress. Head: Normocephalic and atraumatic. Eyes:  Sclerae non-icteric, conjunctive pink. Ears: Normal auditory acuity. Mouth: Dentition intact. No ulcers or lesions.  Neck: Supple, no lymphadenopathy or thyromegaly.  Lungs: Clear bilaterally to auscultation without wheezes, crackles or rhonchi. Heart: Regular rate and rhythm. No murmur, rub or gallop appreciated.  Abdomen: Soft, protuberant, nontender, nondistended. No masses. No hepatosplenomegaly. Normoactive bowel sounds x 4 quadrants.  Rectal: Deferred.  Musculoskeletal: Symmetrical with no gross deformities. Skin: Warm and dry. No rash or lesions on visible extremities. Extremities: Mild bilateral lower extremity edema. Neurological: Alert oriented x 4, no focal deficits.  Psychological:  Alert and cooperative. Normal mood and affect.  ASSESSMENT AND PLAN:  77 year old female with a history of colon polyps presents to schedule a colonoscopy. Colonoscopy 11/30/2017 identified 1 sessile serrated polyp and 1 hyperplastic polyp removed from the colon she was advised to repeat a colonoscopy in 5 years.  -A colon polyp surveillance colonoscopy deferred at this time as patient is considered high risk for any invasive procedure in setting of  CVA symptoms s/p TNK 09/2022 with reported abrupt onset of edema which improved after Amlodipine was discontinued and oral Lasix was initiated.  Patient also has progressive SOB/DOE which warrants cardiology evaluation including updated ECHO -Cardiology evaluation recommended, patient has seen Dr. Anne Fu in the past  Iron deficiency anemia -Request copy of CBC and iron studies from PCP which identified IDA -CBC, CMP, IBC + ferritin, B12 and folate level  -The above laboratory studies confirm IDA, EGD and colonoscopy would be warranted.  Cardiac clearance required prior to pursuing any endoscopic evaluation  GERD, stable on Esomeprazole 20 mg daily  Admitted to the hospital 09/2022 with stroke symptoms s/p TNK. On ASA 81mg  every day.   SOB/DOE for the past few month with recent onset of edema which improved after Amlodipine discontinued and successfully diuresed on oral Lasix.  -Cardiology consul as recommended above -Patient was instructed to go to the emergency room if she develops shortness of breath, chest pain or profound fatigue      CC:  Joycelyn Rua, MD

## 2023-08-26 NOTE — Patient Instructions (Addendum)
Your provider has requested that you go to the basement level for lab work before leaving today. Press "B" on the elevator. The lab is located at the first door on the left as you exit the elevator.  Cardiac clearance is required prior to scheduling your EGD & colonoscopy.  Go to the ED if you have shortness of breath, chest pain or profound fatigue.  Due to recent changes in healthcare laws, you may see the results of your imaging and laboratory studies on MyChart before your provider has had a chance to review them.  We understand that in some cases there may be results that are confusing or concerning to you. Not all laboratory results come back in the same time frame and the provider may be waiting for multiple results in order to interpret others.  Please give [place] 48 hours in order for your provider to thoroughly review all the results before contacting the office for clarification of your results.   Thank you for trusting me with your gastrointestinal care!   Alcide Evener, CRNP

## 2023-08-27 DIAGNOSIS — F3181 Bipolar II disorder: Secondary | ICD-10-CM | POA: Diagnosis not present

## 2023-08-27 NOTE — Progress Notes (Signed)
Agree with assessment plan as outlined.  Agree that she should cardiac evaluation first and that is the most pressing issue.  When she is cleared by cardiology we can proceed with endoscopic evaluation.  Thanks.

## 2023-09-02 ENCOUNTER — Emergency Department (HOSPITAL_COMMUNITY)
Admission: EM | Admit: 2023-09-02 | Discharge: 2023-09-02 | Disposition: A | Discharge status: Home / Self Care | Payer: Medicare Other | Attending: Emergency Medicine | Admitting: Emergency Medicine

## 2023-09-02 ENCOUNTER — Emergency Department (HOSPITAL_COMMUNITY): Payer: Medicare Other

## 2023-09-02 DIAGNOSIS — E611 Iron deficiency: Secondary | ICD-10-CM | POA: Diagnosis not present

## 2023-09-02 DIAGNOSIS — Z7982 Long term (current) use of aspirin: Secondary | ICD-10-CM | POA: Insufficient documentation

## 2023-09-02 DIAGNOSIS — R42 Dizziness and giddiness: Secondary | ICD-10-CM | POA: Diagnosis not present

## 2023-09-02 DIAGNOSIS — I10 Essential (primary) hypertension: Secondary | ICD-10-CM | POA: Diagnosis not present

## 2023-09-02 DIAGNOSIS — R079 Chest pain, unspecified: Secondary | ICD-10-CM | POA: Diagnosis not present

## 2023-09-02 DIAGNOSIS — Z79899 Other long term (current) drug therapy: Secondary | ICD-10-CM | POA: Diagnosis not present

## 2023-09-02 DIAGNOSIS — I700 Atherosclerosis of aorta: Secondary | ICD-10-CM | POA: Diagnosis not present

## 2023-09-02 DIAGNOSIS — R0789 Other chest pain: Secondary | ICD-10-CM | POA: Diagnosis not present

## 2023-09-02 DIAGNOSIS — I471 Supraventricular tachycardia, unspecified: Secondary | ICD-10-CM | POA: Insufficient documentation

## 2023-09-02 DIAGNOSIS — R000 Tachycardia, unspecified: Secondary | ICD-10-CM | POA: Diagnosis not present

## 2023-09-02 DIAGNOSIS — R918 Other nonspecific abnormal finding of lung field: Secondary | ICD-10-CM | POA: Diagnosis not present

## 2023-09-02 LAB — BASIC METABOLIC PANEL
Anion gap: 11 (ref 5–15)
BUN: <5 mg/dL — ABNORMAL LOW (ref 8–23)
CO2: 25 mmol/L (ref 22–32)
Calcium: 8.9 mg/dL (ref 8.9–10.3)
Chloride: 100 mmol/L (ref 98–111)
Creatinine, Ser: 0.53 mg/dL (ref 0.44–1.00)
GFR, Estimated: >60 mL/min (ref >60)
Glucose, Bld: 126 mg/dL — ABNORMAL HIGH (ref 70–99)
Potassium: 4.3 mmol/L (ref 3.5–5.1)
Sodium: 136 mmol/L (ref 135–145)

## 2023-09-02 LAB — CBC WITH DIFFERENTIAL/PLATELET
Abs Immature Granulocytes: 0.01 10*3/uL (ref 0.00–0.07)
Basophils Absolute: 0 10*3/uL (ref 0.0–0.1)
Basophils Relative: 1 %
Eosinophils Absolute: 0.1 10*3/uL (ref 0.0–0.5)
Eosinophils Relative: 2 %
HCT: 37.7 % (ref 36.0–46.0)
Hemoglobin: 11.6 g/dL — ABNORMAL LOW (ref 12.0–15.0)
Immature Granulocytes: 0 %
Lymphocytes Relative: 38 %
Lymphs Abs: 1.5 10*3/uL (ref 0.7–4.0)
MCH: 26.4 pg (ref 26.0–34.0)
MCHC: 30.8 g/dL (ref 30.0–36.0)
MCV: 85.7 fL (ref 80.0–100.0)
Monocytes Absolute: 0.5 10*3/uL (ref 0.1–1.0)
Monocytes Relative: 13 %
Neutro Abs: 1.9 10*3/uL (ref 1.7–7.7)
Neutrophils Relative %: 46 %
Platelets: 169 10*3/uL (ref 150–400)
RBC: 4.4 MIL/uL (ref 3.87–5.11)
RDW: 17.4 % — ABNORMAL HIGH (ref 11.5–15.5)
WBC: 4.1 10*3/uL (ref 4.0–10.5)
nRBC: 0 % (ref 0.0–0.2)

## 2023-09-02 LAB — MAGNESIUM: Magnesium: 1.8 mg/dL (ref 1.7–2.4)

## 2023-09-02 LAB — TSH: TSH: 1.249 u[IU]/mL (ref 0.350–4.500)

## 2023-09-02 LAB — CBG MONITORING, ED: Glucose-Capillary: 133 mg/dL — ABNORMAL HIGH (ref 70–99)

## 2023-09-02 NOTE — ED Triage Notes (Signed)
Pt BIB [facility] for Chest pressure in center of chest beginning around 1600 while pt was sitting still. PT found to be in SVT in 170's.  Vagal maneuver attempted unsuccessfully, pt converted with 6mg  of Adenosine.   Last EMS vitals 162/84 94% RA, RR 16

## 2023-09-02 NOTE — ED Notes (Signed)
Pt has cochlear implant, hard of hearing.  Please call son to discuss and update plan of care throughout stay

## 2023-09-02 NOTE — ED Notes (Signed)
Pt called this NT into her room and asked for ice chips and also asked for her blood sugar to be checked as she felt like it was low. This NT notified RN. Proceeded to check pts blood sugar and provided pt with a diet coke, ice chips, and peanut butter crackers.   CBG 133  RN notified.

## 2023-09-02 NOTE — ED Provider Notes (Signed)
[place] EMERGENCY DEPARTMENT AT [facility] Provider Note   CSN: [id number] Arrival date & time: 09/02/23  1727     History  No chief complaint on file.   Elizabeth Stone is a 77 y.o. female.  Patient is a 77 year old female with past medical history of hypertension, prediabetes, anxiety, GERD, osteoporosis, bipolar disorder, and deafness with cochlear implants presenting for chest pain.  Patient states she was in the kitchen when she had severe chest pressure like somebody was sitting on her chest, shortness of breath, and tightness in her neck.  She was then reported to have syncopized.  When EMS arrived she was reported to be in SVT and converted with 1 dose of adenosine 6 mg.  At this time patient states chest pain has significantly improved but is still mildly present.  Admits to 1 attack that occurred in June 2024 that was similar to today's episode and was told that she had anxiety.    Patient arrived to emergency department in normal sinus rhythm.  The history is provided by the patient. No language interpreter was used.       Home Medications Prior to Admission medications   Medication Sig Start Date End Date Taking? Authorizing Provider  acetaminophen (TYLENOL) 325 MG tablet Take 1 tablet (325 mg total) by mouth every 4 (four) hours as needed for mild pain (or temp > 37.5 C (99.5 F)). 10/20/22   Angiulli, Mcarthur Rossetti, PA-C  albuterol (VENTOLIN HFA) 108 (90 Base) MCG/ACT inhaler Inhale 1-2 puffs into the lungs every 6 (six) hours as needed. 11/19/22   Parrett, Virgel Bouquet, NP  Ascorbic Acid (VITAMIN C ER PO) Vitamin C    [provider]  Asenapine Maleate 10 MG SUBL Place 1 tablet under the tongue at bedtime. 09/06/22   [provider]  aspirin EC 81 MG tablet Take 1 tablet (81 mg total) by mouth daily. Swallow whole. 10/12/22   de Saintclair Halsted, Cortney E, NP  atorvastatin (LIPITOR) 20 MG tablet Take 1 tablet (20 mg total) by mouth at bedtime. 10/20/22   Angiulli,  Mcarthur Rossetti, PA-C  B Complex-C (B-COMPLEX WITH VITAMIN C) tablet Take 1 tablet by mouth daily. 10/20/22   Angiulli, Mcarthur Rossetti, PA-C  buPROPion (WELLBUTRIN XL) 300 MG 24 hr tablet Take 1 tablet (300 mg total) by mouth daily. 10/20/22   Angiulli, Mcarthur Rossetti, PA-C  cetirizine (ZYRTEC) 10 MG chewable tablet Chew 10 mg by mouth at bedtime.    [provider]  CHLORTHALIDONE PO Chlorthalidone    [provider]  clonazePAM (KLONOPIN) 1 MG tablet Take 1 tablet (1 mg total) by mouth at bedtime. 10/20/22   Angiulli, Mcarthur Rossetti, PA-C  Dermatological Products, Misc. [facility] ROSACEA EX) Apply topically Once daily 08/21/20   [provider]  DICLOFENAC POTASSIUM PO Diclofenac Potassium    [provider]  DULoxetine (CYMBALTA) 60 MG capsule Take 1 capsule (60 mg total) by mouth 2 (two) times daily. 10/20/22   Angiulli, Mcarthur Rossetti, PA-C  esomeprazole (NEXIUM) 20 MG capsule Take 1 capsule (20 mg total) by mouth daily. 10/20/22   Angiulli, Mcarthur Rossetti, PA-C  fluocinonide ointment (LIDEX) 0.05 % APPLY TWICE DAILY FOR 14 DAYS.    [provider]  gabapentin (NEURONTIN) 300 MG capsule Take 1 capsule (300 mg total) by mouth at bedtime. 10/20/22   Angiulli, Mcarthur Rossetti, PA-C  HYDROcodone-acetaminophen (NORCO) 10-325 MG tablet Take 1 tablet by mouth every 12 (twelve) hours as needed for severe pain. 10/20/22  Angiulli, Mcarthur Rossetti, PA-C  ibuprofen (ADVIL) 600 MG tablet 1 tablet with food or milk as needed Orally Three times a day 09/30/20   [provider]  lamoTRIgine (LAMICTAL) 150 MG tablet Take 2 tablets (300 mg total) by mouth daily. 10/20/22   Angiulli, Mcarthur Rossetti, PA-C  lamoTRIgine (LAMICTAL) 25 MG tablet 1 tablet Orally for 30 day(s)    [provider]  losartan (COZAAR) 100 MG tablet Take 1 tablet (100 mg total) by mouth daily. 10/20/22   Angiulli, Mcarthur Rossetti, PA-C  metFORMIN (GLUCOPHAGE-XR) 750 MG 24 hr tablet Take 1 tablet (750 mg total) by mouth every evening. 10/20/22   Angiulli,  Mcarthur Rossetti, PA-C  molnupiravir EUA (LAGEVRIO) 200 MG CAPS capsule Take 4 capsules by mouth every 12 hours for 5 days 06/15/23     montelukast (SINGULAIR) 10 MG tablet Take 1 tablet (10 mg total) by mouth at bedtime. 10/20/22   Angiulli, Mcarthur Rossetti, PA-C  PANTOPRAZOLE SODIUM PO Pantoprazole Sodium    [provider]  potassium chloride SA (KLOR-CON M) 20 MEQ tablet Take 20 mEq by mouth daily. 11/09/22   [provider]  Spacer/Aero-Holding Deretha Emory DEVI 1 each by Does not apply route as needed. 11/20/22   Parrett, Virgel Bouquet, NP  Sulfacetamide Sodium 10 % LIQD 1 application Externally Once a day 11/11/21   [provider]  VITAMIN D PO Take 1,000 Units by mouth daily.    [provider]  Vitamin D, Ergocalciferol, (DRISDOL) 1.25 MG (50000 UNIT) CAPS capsule Take 1 capsule (50,000 Units total) by mouth every 7 (seven) days. 10/20/22   Angiulli, Mcarthur Rossetti, PA-C      Allergies    Latex and Adhesive [tape]    Review of Systems   Review of Systems  Constitutional:  Negative for chills and fever.  HENT:  Negative for ear pain and sore throat.   Eyes:  Negative for pain and visual disturbance.  Respiratory:  Positive for chest tightness and shortness of breath. Negative for cough.   Cardiovascular:  Positive for chest pain and palpitations.  Gastrointestinal:  Negative for abdominal pain and vomiting.  Genitourinary:  Negative for dysuria and hematuria.  Musculoskeletal:  Negative for arthralgias and back pain.  Skin:  Negative for color change and rash.  Neurological:  Negative for seizures and syncope.  All other systems reviewed and are negative.   Physical Exam Updated Vital Signs BP (!) 147/72   Pulse 100   Temp 98 F (36.7 C)   Resp 18   LMP  (LMP Unknown)   SpO2 93%  Physical Exam Vitals and nursing note reviewed.  Constitutional:      General: She is not in acute distress.    Appearance: She is well-developed.  HENT:     Head: Normocephalic and  atraumatic.  Eyes:     Conjunctiva/sclera: Conjunctivae normal.  Cardiovascular:     Rate and Rhythm: Normal rate and regular rhythm.     Heart sounds: No murmur heard. Pulmonary:     Effort: Pulmonary effort is normal. No respiratory distress.     Breath sounds: Normal breath sounds.  Abdominal:     Palpations: Abdomen is soft.     Tenderness: There is no abdominal tenderness.  Musculoskeletal:        General: No swelling.     Cervical back: Neck supple.  Skin:    General: Skin is warm and dry.     Capillary Refill: Capillary refill takes less than 2 seconds.  Neurological:     Mental Status: She is alert.  Psychiatric:        Mood and Affect: Mood normal.     ED Results / Procedures / Treatments   Labs (all labs ordered are listed, but only abnormal results are displayed) Labs Reviewed  CBC WITH DIFFERENTIAL/PLATELET - Abnormal; Notable for the following components:      Result Value   Hemoglobin 11.6 (*)    RDW 17.4 (*)    All other components within normal limits  BASIC METABOLIC PANEL - Abnormal; Notable for the following components:   Glucose, Bld 126 (*)    BUN <5 (*)    All other components within normal limits  TSH  MAGNESIUM    EKG None  Radiology No results found.  Procedures Procedures    Medications Ordered in ED Medications - No data to display  ED Course/ Medical Decision Making/ A&P                                 Medical Decision Making Amount and/or Complexity of Data Reviewed Labs: ordered. Radiology: ordered.   11:63 PM 77 year old female with past medical history of hypertension, prediabetes, anxiety, GERD, osteoporosis, bipolar disorder, and deafness with cochlear implants presenting for chest pain.  Patient is alert and oriented x 3, no acute distress, afebrile, stable vital signs.  ECG demonstrates normal sinus rhythm.  Labs pending.  Laboratory studies are stable.  Stable electrolytes.  Stable thyroid function.  Stable chest  x-ray.  Patient stable for discharge home at this time with close follow-up with cardiology for SVT.  Repeat SVT in emergency department.  Patient in no distress and overall condition improved here in the ED. Detailed discussions were had with the patient regarding current findings, and need for close f/u with PCP or on call doctor. The patient has been instructed to return immediately if the symptoms worsen in any way for re-evaluation. Patient verbalized understanding and is in agreement with current care plan. All questions answered prior to discharge.         Final Clinical Impression(s) / ED Diagnoses Final diagnoses:  SVT (supraventricular tachycardia) [facility])    Rx / DC Orders ED Discharge Orders     None         Franne Forts, DO 09/02/23 2037

## 2023-09-02 NOTE — Discharge Instructions (Addendum)
Today you were diagnosed with supraventricular tachycardia otherwise known as SVT.  This is a cardiac arrhythmia in which the heart beats too fast.  Symptoms of SVT include lightheadedness, dizziness, palpitations, shortness of breath, or chest pressure.  Please follow-up closely with a cardiologist for your symptoms today.  Please return to emergency department immediately for evaluation if symptoms recur.

## 2023-09-03 ENCOUNTER — Telehealth: Payer: Self-pay | Admitting: Cardiology

## 2023-09-03 ENCOUNTER — Telehealth: Payer: Self-pay | Admitting: Nurse Practitioner

## 2023-09-03 NOTE — Telephone Encounter (Signed)
Returned call to [facility]. States they are out to lunch. Left voicemail to return call to office

## 2023-09-03 NOTE — Telephone Encounter (Signed)
Inbound call from patient, following up on lab results.

## 2023-09-03 NOTE — Telephone Encounter (Signed)
Caller Annabelle Harman) called to report patient had an ED visit since referral and only wants to see Dr. Anne Fu.

## 2023-09-06 ENCOUNTER — Other Ambulatory Visit: Payer: Self-pay

## 2023-09-06 DIAGNOSIS — D649 Anemia, unspecified: Secondary | ICD-10-CM

## 2023-09-06 NOTE — Telephone Encounter (Signed)
Left message for pt to call back  °

## 2023-09-06 NOTE — Telephone Encounter (Signed)
Left voicemail to return call to office.

## 2023-09-07 ENCOUNTER — Telehealth (HOSPITAL_COMMUNITY): Payer: Self-pay | Admitting: *Deleted

## 2023-09-07 ENCOUNTER — Ambulatory Visit: Payer: Medicare Other | Attending: Cardiology | Admitting: Cardiology

## 2023-09-07 ENCOUNTER — Encounter: Payer: Self-pay | Admitting: Cardiology

## 2023-09-07 ENCOUNTER — Encounter: Payer: Self-pay | Admitting: *Deleted

## 2023-09-07 VITALS — BP 138/70 | HR 86 | Ht 61.0 in | Wt 168.0 lb

## 2023-09-07 DIAGNOSIS — I10 Essential (primary) hypertension: Secondary | ICD-10-CM

## 2023-09-07 DIAGNOSIS — I471 Supraventricular tachycardia, unspecified: Secondary | ICD-10-CM | POA: Diagnosis not present

## 2023-09-07 DIAGNOSIS — I639 Cerebral infarction, unspecified: Secondary | ICD-10-CM

## 2023-09-07 DIAGNOSIS — R072 Precordial pain: Secondary | ICD-10-CM

## 2023-09-07 DIAGNOSIS — E782 Mixed hyperlipidemia: Secondary | ICD-10-CM | POA: Diagnosis not present

## 2023-09-07 MED ORDER — METOPROLOL SUCCINATE ER 25 MG PO TB24: 25.0000 mg | ORAL_TABLET | Freq: Every day | ORAL | 3 refills | Status: AC

## 2023-09-07 NOTE — Telephone Encounter (Signed)
Noted regarding recent ED visit.  Pt was seen by Dr Anne Fu this am.

## 2023-09-07 NOTE — Patient Instructions (Signed)
Medication Instructions:  Please start Metoprolol Succinate 50 mg once a day. Continue all other medications as listed.  *If you need a refill on your cardiac medications before your next appointment, please call your pharmacy*  Testing/Procedures: Your physician has requested that you have a lexiscan myoview. For further information please visit [email or web address] Please follow instruction sheet, as given.  Follow-Up: At [facility], you and your health needs are our priority.  As part of our continuing mission to provide you with exceptional heart care, we have created designated Provider Care Teams.  These Care Teams include your primary Cardiologist (physician) and Advanced Practice Providers (APPs -  Physician Assistants and Nurse Practitioners) who all work together to provide you with the care you need, when you need it.  We recommend signing up for the patient portal called "MyChart".  Sign up information is provided on this After Visit Summary.  MyChart is used to connect with patients for Virtual Visits (Telemedicine).  Patients are able to view lab/test results, encounter notes, upcoming appointments, etc.  Non-urgent messages can be sent to your provider as well.   To learn more about what you can do with MyChart, go to [email or web address].    Your next appointment:   6 month(s)  Provider:   Jari Favre, PA-C, Robin Searing, NP, Jacolyn Reedy, PA-C, Eligha Bridegroom, NP, Tereso Newcomer, PA-C, or Perlie Gold, PA-C

## 2023-09-07 NOTE — Progress Notes (Signed)
Cardiology Office Note:  .   Date:  09/07/2023  ID:  Elizabeth Stone, DOB Apr 23, 1946, MRN [id number] PCP: Joycelyn Rua, MD  [facility] Providers Cardiologist:  Donato Schultz, MD     History of Present Illness: Elizabeth Stone   Elizabeth Stone is a 77 y.o. female Discussed the use of AI scribe  History of Present Illness   A 77 year old patient with a history of hypertension, prediabetes, anxiety, GERD, bipolar disorder, deafness, cochlear implants, and Meniere's disease presented for evaluation of supraventricular tachycardia (SVT). The patient had an episode of severe chest pressure, shortness of breath, and neck tightness on 09/02/2023, which led to an emergency department visit. The patient may have fainted during this episode. Upon EMS arrival, the patient was reportedly in SVT and was converted with one dose of adenosine 6mg , which significantly improved the chest pain.  The patient had a similar episode in June 2024, which was initially attributed to anxiety. The patient also reported a history of stroke-like symptoms in December 2023, presenting with gait abnormality and received IV TNK. The patient has had severe vertigo issues since she 30s. A CT scan showed focal proximal LID and circumferential artery calcification. The patient had a low-risk nuclear stress test in 2020.  The patient reported episodes of palpitations, which first occurred in March and were initially diagnosed as panic attacks. The patient described the sensation as if someone was standing on her chest, causing difficulty breathing. The patient also reported fainting during these episodes. The patient's medication regimen includes aspirin, atorvastatin, losartan, and recently added Lasix.  The patient reported recent weight gain of 7.5 pounds, with swelling in the face, hands, and ankles, and difficulty breathing. The patient also reported anemia and is scheduled for an endoscopy and colonoscopy to investigate potential blood  loss. The patient's hemoglobin was 11.6, and LDL was 67. The patient was previously on iron supplements but was recently taken off.            Studies Reviewed: Elizabeth Stone   EKG Interpretation Date/Time:  Tuesday September 07 2023 09:36:18 EST Ventricular Rate:  94 PR Interval:  144 QRS Duration:  76 QT Interval:  372 QTC Calculation: 465 R Axis:   61  Text Interpretation: Normal sinus rhythm Normal ECG When compared with ECG of 02-Sep-2023 17:35, No significant change since last tracing Confirmed by Donato Schultz ([id number]) on 09/07/2023 9:38:30 AM    Results LABS Hemoglobin: 11.6 g/dL (72/53/6644) LDL: 77 mg/dL Hemoglobin I3K: 7.4%  RADIOLOGY CT angiogram head: No large vessel disease (09/2022) CT scan: Focal proximal LAD and circumferential artery calcification  DIAGNOSTIC EKG: Sinus tachycardia, 100 bpm (09/02/2023) Nuclear stress test: Low risk (2020)  Risk Assessment/Calculations:            Physical Exam:   VS:  BP 138/70   Pulse 86   Ht 5\' 1"  (1.549 m)   Wt 168 lb (76.2 kg)   LMP  (LMP Unknown)   SpO2 96%   BMI 31.74 kg/m    Wt Readings from Last 3 Encounters:  09/07/23 168 lb (76.2 kg)  08/26/23 168 lb 9 oz (76.5 kg)  10/07/22 169 lb 5 oz (76.8 kg)    GEN: Well nourished, well developed in no acute distress NECK: No JVD; No carotid bruits CARDIAC: RRR, no murmurs, no rubs, no gallops RESPIRATORY:  Clear to auscultation without rales, wheezing or rhonchi  ABDOMEN: Soft, non-tender, non-distended EXTREMITIES:  No edema; No deformity   ASSESSMENT AND PLAN: .  Assessment and Plan    Supraventricular Tachycardia (SVT) Recurrent SVT episodes, most recently on 09/02/2023, with severe chest pressure, shortness of breath, and neck tightness. Converted with adenosine 6 mg. Previous episode in June 2024, initially thought to be anxiety. Post-SVT EKG showed sinus tachycardia at 100 bpm. Comorbidities include hypertension, prediabetes, anxiety, GERD, bipolar disorder,  and Meniere's disease. Symptoms include palpitations, chest pressure, and fainting. Discussed metoprolol to manage heart rate and prevent further episodes, emphasizing its benefits despite patient's preference to avoid additional medications. - Prescribe metoprolol 25 mg XL once daily - Order Lexiscan stress test - Follow up in 6 months with APP  Anemia Hemoglobin 11.6, mildly decreased. Iron supplementation recently discontinued by another provider, with improved gastrointestinal symptoms. Discussed endoscopy and colonoscopy to investigate gastrointestinal blood loss, explaining risks (bleeding, infection, perforation) and benefits of identifying and treating the source of anemia. - Approve endoscopy and colonoscopy to investigate source of blood loss  General Health Maintenance LDL well-controlled at 67 mg/dL. Low-risk nuclear stress test in 2020. Emphasized regular follow-up, weight monitoring, and fluid retention due to recent weight gain and swelling. Discussed continuing current medications to manage cardiovascular risk factors. - Continue current medications: aspirin 81 mg, atorvastatin, losartan 100 mg, potassium, Lasix - Monitor weight and symptoms of fluid retention - Reassess need for iron supplementation based on upcoming diagnostic results  Follow-up - Follow up in 6 months with APP.               Signed, Donato Schultz, MD

## 2023-09-07 NOTE — Telephone Encounter (Signed)
Pt was reached on 09/06/23 & discussed lab results. Refer to lab note 08/26/23.

## 2023-09-07 NOTE — Telephone Encounter (Signed)
Left message on voicemail per DPR in reference to upcoming appointment scheduled on  09/15/23 with detailed instructions given per Myocardial Perfusion Study Information Sheet for the test. LM to arrive 15 minutes early, and that it is imperative to arrive on time for appointment to keep from having the test rescheduled. If you need to cancel or reschedule your appointment, please call the office within 24 hours of your appointment. Failure to do so may result in a cancellation of your appointment, and a $50 no show fee. Phone number given for call back for any questions. Ricky Ala

## 2023-09-15 ENCOUNTER — Encounter (HOSPITAL_COMMUNITY): Payer: Medicare Other

## 2023-09-22 ENCOUNTER — Telehealth (HOSPITAL_COMMUNITY): Payer: Self-pay | Admitting: *Deleted

## 2023-09-22 NOTE — Telephone Encounter (Signed)
Left message on voicemail per DPR in reference to upcoming appointment scheduled on 09/28/2023 at 12:45 with detailed instructions given per Myocardial Perfusion Study Information Sheet for the test. LM to arrive 15 minutes early, and that it is imperative to arrive on time for appointment to keep from having the test rescheduled. If you need to cancel or reschedule your appointment, please call the office within 24 hours of your appointment. Failure to do so may result in a cancellation of your appointment, and a $50 no show fee. Phone number given for call back for any questions.

## 2023-09-24 ENCOUNTER — Other Ambulatory Visit (INDEPENDENT_AMBULATORY_CARE_PROVIDER_SITE_OTHER): Payer: Medicare Other

## 2023-09-24 DIAGNOSIS — D649 Anemia, unspecified: Secondary | ICD-10-CM

## 2023-09-24 LAB — CBC WITH DIFFERENTIAL/PLATELET
Basophils Absolute: 0 10*3/uL (ref 0.0–0.1)
Basophils Relative: 0.6 % (ref 0.0–3.0)
Eosinophils Absolute: 0.1 10*3/uL (ref 0.0–0.7)
Eosinophils Relative: 1.3 % (ref 0.0–5.0)
HCT: 40.3 % (ref 36.0–46.0)
Hemoglobin: 13.1 g/dL (ref 12.0–15.0)
Lymphocytes Relative: 30.2 % (ref 12.0–46.0)
Lymphs Abs: 1.5 10*3/uL (ref 0.7–4.0)
MCHC: 32.6 g/dL (ref 30.0–36.0)
MCV: 84.2 fL (ref 78.0–100.0)
Monocytes Absolute: 0.6 10*3/uL (ref 0.1–1.0)
Monocytes Relative: 12.3 % — ABNORMAL HIGH (ref 3.0–12.0)
Neutro Abs: 2.8 10*3/uL (ref 1.4–7.7)
Neutrophils Relative %: 55.6 % (ref 43.0–77.0)
Platelets: 177 10*3/uL (ref 150.0–400.0)
RBC: 4.78 Mil/uL (ref 3.87–5.11)
RDW: 18.3 % — ABNORMAL HIGH (ref 11.5–15.5)
WBC: 5 10*3/uL (ref 4.0–10.5)

## 2023-09-24 LAB — IBC + FERRITIN
Ferritin: 29.9 ng/mL (ref 10.0–291.0)
Iron: 44 ug/dL (ref 42–145)
Saturation Ratios: 8.8 % — ABNORMAL LOW (ref 20.0–50.0)
TIBC: 499.8 ug/dL — ABNORMAL HIGH (ref 250.0–450.0)
Transferrin: 357 mg/dL (ref 212.0–360.0)

## 2023-09-27 NOTE — Progress Notes (Signed)
Refer to office visit and lab result notes 08/26/2023. Repeat labs 12/6 done FASTING showed a normal Hg level, normal iron, decreased saturation ratios and elevated TIBC. As noted during her office visit 11/7, endoscopic evaluation deferred, patient is high risk for any procedure. Since then, review of her chart showed she was seen in the ED due to having chest pain, syncope and was in SVT converted with Adenosine then discharged home. Dr. Adela Lank, defer GI follow up recommendations to you. THX.

## 2023-09-28 ENCOUNTER — Ambulatory Visit (HOSPITAL_COMMUNITY): Payer: Medicare Other | Attending: Cardiology

## 2023-09-28 DIAGNOSIS — R072 Precordial pain: Secondary | ICD-10-CM | POA: Diagnosis not present

## 2023-09-28 LAB — MYOCARDIAL PERFUSION IMAGING
Base ST Depression (mm): 0 mm
LV dias vol: 62 mL (ref 46–106)
LV sys vol: 18 mL
Nuc Stress EF: 71 %
Peak HR: 85 {beats}/min
Rest HR: 70 {beats}/min
Rest Nuclear Isotope Dose: 10.6 mCi
SDS: 0
SRS: 0
SSS: 0
ST Depression (mm): 0 mm
Stress Nuclear Isotope Dose: 30.3 mCi
TID: 0.96

## 2023-09-28 MED ORDER — TECHNETIUM TC 99M TETROFOSMIN IV KIT
10.6000 | PACK | Freq: Once | INTRAVENOUS | Status: AC | PRN
  Administered 2023-09-28: 10.6 via INTRAVENOUS

## 2023-09-28 MED ORDER — TECHNETIUM TC 99M TETROFOSMIN IV KIT
30.3000 | PACK | Freq: Once | INTRAVENOUS | Status: AC | PRN
  Administered 2023-09-28: 30.3 via INTRAVENOUS

## 2023-09-28 MED ORDER — REGADENOSON 0.4 MG/5ML IV SOLN
0.4000 mg | Freq: Once | INTRAVENOUS | Status: AC
  Administered 2023-09-28: 0.4 mg via INTRAVENOUS

## 2023-10-01 ENCOUNTER — Telehealth: Payer: Self-pay | Admitting: Cardiology

## 2023-10-01 NOTE — Telephone Encounter (Signed)
Patient returned call for her test results.  

## 2023-10-04 DIAGNOSIS — G894 Chronic pain syndrome: Secondary | ICD-10-CM | POA: Diagnosis not present

## 2023-10-04 DIAGNOSIS — R609 Edema, unspecified: Secondary | ICD-10-CM | POA: Diagnosis not present

## 2023-10-04 DIAGNOSIS — I471 Supraventricular tachycardia, unspecified: Secondary | ICD-10-CM | POA: Diagnosis not present

## 2023-10-04 NOTE — Telephone Encounter (Signed)
Elizabeth Bathe, MD      09/28/23  8:54 PM Result Note Study is normal and low risk  Attempted to contact pt to review results.  Left message of normal results and to call back if any further questions or concerns.

## 2023-10-05 NOTE — Telephone Encounter (Signed)
Jake Bathe, MD 09/28/2023  8:54 PM EST     Study is normal and low risk    Pt has not called back to office with further questions/concerns.  Will close this encounter and await a call back if further questions.

## 2023-10-20 HISTORY — PX: SPINAL CORD STIMULATOR INSERTION: SHX5378

## 2023-10-22 ENCOUNTER — Telehealth: Payer: Self-pay | Admitting: Nurse Practitioner

## 2023-10-22 NOTE — Telephone Encounter (Signed)
 Thanks [employer]. Agree with plan to schedule for procedures if she is willing

## 2023-10-22 NOTE — Telephone Encounter (Signed)
 Left message for patient to call back

## 2023-10-22 NOTE — Telephone Encounter (Signed)
 Kaira, pls contact patient and schedule her for an EGD and colonoscopy with Dr. Leigh. Refer to office visit note 08/26/2023.   Patient was seen by cardiologist 09/07/2023 at that time, Dr. Jeffrie documented Approve endoscopy and colonoscopy to investigate source of blood loss. Patient subsequently underwent a Lexiscan  stress test 09/28/2023 which was normal, low risk. Normal LV function.   Patient to schedule EGD and colonoscopy as verified by Dr. Leigh. Refer to lab notes 09/24/2023.

## 2023-10-25 NOTE — Telephone Encounter (Signed)
 Patient called and stated that she was returning a call to [place]. Patient is requesting that you return her call before the end of the day. Please advise.

## 2023-10-28 DIAGNOSIS — J069 Acute upper respiratory infection, unspecified: Secondary | ICD-10-CM | POA: Diagnosis not present

## 2023-10-28 NOTE — Telephone Encounter (Signed)
 Left message for patient to call back

## 2023-10-29 NOTE — Telephone Encounter (Signed)
 Left message for patient to call back

## 2023-11-01 NOTE — Telephone Encounter (Signed)
 Patient called and stated she was returning a call. Patient also stated that she would like a call back by then end of today. Please advise .

## 2023-11-02 NOTE — Telephone Encounter (Signed)
 Left message for patient to call back

## 2023-11-04 NOTE — Telephone Encounter (Signed)
Scheduled endo with patient for 12/16/23 at 3:30 pm with Dr. Adela Lank & PV for 11/23/23 at 2:00 pm by phone per pt request. She is not on any blood thinners, however is diabetic. She did not have any further questions.

## 2023-11-05 ENCOUNTER — Ambulatory Visit: Payer: Medicare Other | Admitting: Cardiology

## 2023-11-09 DIAGNOSIS — M48061 Spinal stenosis, lumbar region without neurogenic claudication: Secondary | ICD-10-CM | POA: Diagnosis not present

## 2023-11-23 ENCOUNTER — Ambulatory Visit (AMBULATORY_SURGERY_CENTER): Payer: Medicare Other

## 2023-11-23 VITALS — Ht 61.0 in | Wt 169.0 lb

## 2023-11-23 DIAGNOSIS — Z860100 Personal history of colon polyps, unspecified: Secondary | ICD-10-CM

## 2023-11-23 DIAGNOSIS — D649 Anemia, unspecified: Secondary | ICD-10-CM

## 2023-11-23 MED ORDER — NA SULFATE-K SULFATE-MG SULF 17.5-3.13-1.6 GM/177ML PO SOLN: 1.0000 | Freq: Once | ORAL | 0 refills | Status: AC

## 2023-11-23 NOTE — Progress Notes (Signed)
No egg or soy allergy known to patient  No issues known to pt with past sedation with any surgeries or procedures Patient denies ever being told they had issues or difficulty with intubation  No FH of Malignant Hyperthermia Pt is not on diet pills Pt is not on  home 02  Pt is not on blood thinners  Pt reports constipation  No A fib or A flutter Have any cardiac testing pending-- no LOA: independent  Prep: suprep    Patient's chart reviewed by Cathlyn Parsons CNRA prior to previsit and patient appropriate for the [facility].  Previsit completed and red dot placed by patient's name on their procedure day (on provider's schedule).     PV completed with patient. Prep instructions sent via mychart and home address.

## 2023-11-25 ENCOUNTER — Ambulatory Visit: Payer: Medicare Other

## 2023-11-25 ENCOUNTER — Ambulatory Visit: Payer: Medicare Other | Admitting: Podiatry

## 2023-11-25 ENCOUNTER — Ambulatory Visit (INDEPENDENT_AMBULATORY_CARE_PROVIDER_SITE_OTHER): Payer: Medicare Other

## 2023-11-25 DIAGNOSIS — M7752 Other enthesopathy of left foot: Secondary | ICD-10-CM

## 2023-11-25 DIAGNOSIS — M792 Neuralgia and neuritis, unspecified: Secondary | ICD-10-CM | POA: Diagnosis not present

## 2023-11-25 DIAGNOSIS — M79671 Pain in right foot: Secondary | ICD-10-CM

## 2023-11-25 DIAGNOSIS — M778 Other enthesopathies, not elsewhere classified: Secondary | ICD-10-CM

## 2023-11-28 NOTE — Progress Notes (Signed)
 Subjective:   Patient ID: Elizabeth Stone, female   DOB: 78 y.o.   MRN: [id number]   HPI Chief Complaint  Patient presents with   Foot Pain    RM#12 Left foot hurting started  months ago around the time sciatica acted up.   78 year old female presents the office with above concerns.  She states that she is getting burning, numbness and tingling and pain to her left foot.  This started around the same time that she started a nerve issues, sciatica.  She states that she is scheduled for an injection send on.  She does not report any injuries to the foot but she would like to have the foot checked.   Review of Systems  All other systems reviewed and are negative.  Past Medical History:  Diagnosis Date   Acute blood loss anemia    hx   Anxiety    Arthritis    Balance problem    Bipolar disorder (HCC)    Cochlear implant in place    Cochlear implant in place    right ear   Depression    Dermatitis    on face   Dyspnea    Fibromyalgia    GERD (gastroesophageal reflux disease)    Hard of hearing    Hearing aid worn    left ear   Hearing impaired    has cochlear implant   HTN (hypertension)    Hypokalemia    Meniere disease    takes HCTZ for her inner ear problem   Mixed hyperlipidemia 09/27/2013   Osteoporosis    Pneumonia 11/2016   Pre-diabetes    Primary localized osteoarthritis of left knee 11/16/2018   Primary localized osteoarthritis of right knee    Slow transit constipation    Unsteady gait     Past Surgical History:  Procedure Laterality Date   ABDOMINAL HYSTERECTOMY  1986   bladder tack  2007   COCHLEAR IMPLANT     2007   COLONOSCOPY     COLONOSCOPY     HAMMER TOE SURGERY  2014   left foot   REVERSE SHOULDER ARTHROPLASTY Right 12/13/2014   dr dozier   REVERSE SHOULDER ARTHROPLASTY Right 12/13/2014   Procedure: REVERSE SHOULDER ARTHROPLASTY rt;  Surgeon: Eva Elsie Dozier, MD;  Location: [facility] OR;  Service: Orthopedics;  Laterality: Right;  Right  reverse total shoulder replacement   REVERSE SHOULDER ARTHROPLASTY Left 01/07/2017   Procedure: REVERSE LEFT TOTAL SHOULDER ARTHROPLASTY;  Surgeon: Eva Dozier, MD;  Location: [facility] OR;  Service: Orthopedics;  Laterality: Left;  Left reveres total shoulder arthroplasty   TOTAL KNEE ARTHROPLASTY Right 12/16/2015   Procedure: TOTAL KNEE ARTHROPLASTY;  Surgeon: Lamar Millman, MD;  Location: [facility] OR;  Service: Orthopedics;  Laterality: Right;   TOTAL KNEE ARTHROPLASTY Left 11/28/2018   Procedure: LEFT TOTAL KNEE ARTHROPLASTY;  Surgeon: Millman Lamar, MD;  Location: [facility] OR;  Service: Orthopedics;  Laterality: Left;     Current Outpatient Medications:    acetaminophen  (TYLENOL ) 325 MG tablet, Take 1 tablet (325 mg total) by mouth every 4 (four) hours as needed for mild pain (or temp > 37.5 C (99.5 F))., Disp: , Rfl:    albuterol  (VENTOLIN  HFA) 108 (90 Base) MCG/ACT inhaler, Inhale 1-2 puffs into the lungs every 6 (six) hours as needed., Disp: 8 g, Rfl: 2   alendronate (FOSAMAX) 70 MG tablet, Take 70 mg by mouth once a week., Disp: , Rfl:    Ascorbic Acid  (VITAMIN C  ER PO), Vitamin  C, Disp: , Rfl:    Asenapine  Maleate 10 MG SUBL, Place 1 tablet under the tongue at bedtime., Disp: , Rfl:    aspirin  EC 81 MG tablet, Take 1 tablet (81 mg total) by mouth daily. Swallow whole., Disp: 30 tablet, Rfl: 12   atorvastatin  (LIPITOR) 20 MG tablet, Take 1 tablet (20 mg total) by mouth at bedtime., Disp: 30 tablet, Rfl: 0   B Complex-C (B-COMPLEX WITH VITAMIN C ) tablet, Take 1 tablet by mouth daily., Disp: 30 tablet, Rfl: 0   buPROPion  (WELLBUTRIN  XL) 300 MG 24 hr tablet, Take 1 tablet (300 mg total) by mouth daily., Disp: 30 tablet, Rfl: 0   cetirizine (ZYRTEC) 10 MG chewable tablet, Chew 10 mg by mouth at bedtime., Disp: , Rfl:    CHLORTHALIDONE  PO, , Disp: , Rfl:    clonazePAM  (KLONOPIN ) 1 MG tablet, Take 1 tablet (1 mg total) by mouth at bedtime., Disp: 30 tablet, Rfl: 0   Dermatological Products, Misc. (DERMAREST  ROSACEA EX), Apply topically Once daily, Disp: , Rfl:    DICLOFENAC POTASSIUM PO, Diclofenac Potassium, Disp: , Rfl:    DULoxetine  (CYMBALTA ) 60 MG capsule, Take 1 capsule (60 mg total) by mouth 2 (two) times daily., Disp: 60 capsule, Rfl: 0   esomeprazole  (NEXIUM ) 20 MG capsule, Take 1 capsule (20 mg total) by mouth daily., Disp: 30 capsule, Rfl: 0   fluocinonide ointment (LIDEX) 0.05 %, , Disp: , Rfl:    FLUZONE HIGH-DOSE 0.5 ML injection, , Disp: , Rfl:    furosemide (LASIX) 20 MG tablet, Take 20 mg by mouth daily., Disp: , Rfl:    gabapentin  (NEURONTIN ) 300 MG capsule, Take 1 capsule (300 mg total) by mouth at bedtime., Disp: 30 capsule, Rfl: 0   HYDROcodone -acetaminophen  (NORCO) 10-325 MG tablet, Take 1 tablet by mouth every 12 (twelve) hours as needed for severe pain., Disp: 30 tablet, Rfl: 0   ibuprofen  (ADVIL ) 600 MG tablet, , Disp: , Rfl:    lamoTRIgine  (LAMICTAL ) 150 MG tablet, Take 2 tablets (300 mg total) by mouth daily., Disp: 60 tablet, Rfl: 0   lamoTRIgine  (LAMICTAL ) 25 MG tablet, , Disp: , Rfl:    LIDODERM  5 %, SMARTSIG:1-3 Patch(s) Topical Daily, Disp: , Rfl:    losartan  (COZAAR ) 100 MG tablet, Take 1 tablet (100 mg total) by mouth daily., Disp: 30 tablet, Rfl: 0   metFORMIN  (GLUCOPHAGE -XR) 750 MG 24 hr tablet, Take 1 tablet (750 mg total) by mouth every evening., Disp: 30 tablet, Rfl: 0   metoprolol  succinate (TOPROL -XL) 25 MG 24 hr tablet, Take 1 tablet (25 mg total) by mouth daily., Disp: 90 tablet, Rfl: 3   molnupiravir  EUA (LAGEVRIO ) 200 MG CAPS capsule, Take 4 capsules by mouth every 12 hours for 5 days, Disp: 40 capsule, Rfl: 0   montelukast  (SINGULAIR ) 10 MG tablet, Take 1 tablet (10 mg total) by mouth at bedtime., Disp: 30 tablet, Rfl: 0   PANTOPRAZOLE  SODIUM PO, , Disp: , Rfl:    potassium chloride  SA (KLOR-CON  M) 20 MEQ tablet, Take 20 mEq by mouth daily., Disp: , Rfl:    Spacer/Aero-Holding Chambers DEVI, 1 each by Does not apply route as needed., Disp: 1 each, Rfl:  0   Sulfacetamide Sodium 10 % LIQD, , Disp: , Rfl:    VITAMIN D  PO, Take 1,000 Units by mouth daily., Disp: , Rfl:    Vitamin D , Ergocalciferol , (DRISDOL ) 1.25 MG (50000 UNIT) CAPS capsule, Take 1 capsule (50,000 Units total) by mouth every 7 (seven) days.,  Disp: 5 capsule, Rfl: 0  Allergies  Allergen Reactions   Latex Hives and Rash    Tape   (No problems with gloves)   Adhesive [Tape] Rash and Other (See Comments)    PT STATED ALLERGY TO TAPE, NOT LATEX GLOVES AS PREVIOUSLY DOCUMENTED (Pt prefers paper tape.)           Objective:  Physical Exam  General: AAO x3, NAD  Dermatological: Skin is warm, dry and supple bilateral.  There are no open sores, no preulcerative lesions, no rash or signs of infection present.  Vascular: Dorsalis Pedis artery and Posterior Tibial artery pedal pulses are 2/4 bilateral with immedate capillary fill time.  There is no pain with calf compression, swelling, warmth, erythema.   Neruologic: Grossly intact via light touch bilateral.  Sensation appears intact with Semmes Weinstein monofilament to the left side.  Musculoskeletal: Previous right first MPJ arthrodesis noted no pain on the right foot.  Digital contractures noted to left foot second, third toes.  Unable to appreciate any area pinpoint tenderness.     Assessment:   Neuritis left foot     Plan:  -Treatment options discussed including all alternatives, risks, and complications -Etiology of symptoms were discussed -X-rays were obtained and reviewed with the patient.  3 views left foot were obtained.  No evidence of acute fracture.  Right second metatarsal with hardware intact.  Digital contractures present.  Previous hammertoe repair present -Unable to appreciate significant pain on exam and she described more nerve symptoms which I think could be coming from her back or other nerve issues.  I will follow-up with her after she gets the injection in her back to see if this helps her foot at  all.  Would not consider nerve conduction test.  Donnice JONELLE Fees DPM

## 2023-12-13 ENCOUNTER — Encounter: Payer: Self-pay | Admitting: Gastroenterology

## 2023-12-16 ENCOUNTER — Encounter: Payer: Self-pay | Admitting: Gastroenterology

## 2023-12-16 ENCOUNTER — Ambulatory Visit: Payer: Medicare Other | Admitting: Gastroenterology

## 2023-12-16 VITALS — BP 155/54 | HR 80 | Temp 99.7°F | Resp 18 | Ht 61.0 in | Wt 169.0 lb

## 2023-12-16 DIAGNOSIS — K648 Other hemorrhoids: Secondary | ICD-10-CM | POA: Diagnosis not present

## 2023-12-16 DIAGNOSIS — Z1211 Encounter for screening for malignant neoplasm of colon: Secondary | ICD-10-CM

## 2023-12-16 DIAGNOSIS — D123 Benign neoplasm of transverse colon: Secondary | ICD-10-CM | POA: Diagnosis not present

## 2023-12-16 DIAGNOSIS — D509 Iron deficiency anemia, unspecified: Secondary | ICD-10-CM | POA: Diagnosis not present

## 2023-12-16 DIAGNOSIS — K639 Disease of intestine, unspecified: Secondary | ICD-10-CM

## 2023-12-16 DIAGNOSIS — K31819 Angiodysplasia of stomach and duodenum without bleeding: Secondary | ICD-10-CM | POA: Diagnosis not present

## 2023-12-16 DIAGNOSIS — K559 Vascular disorder of intestine, unspecified: Secondary | ICD-10-CM | POA: Diagnosis not present

## 2023-12-16 DIAGNOSIS — K635 Polyp of colon: Secondary | ICD-10-CM

## 2023-12-16 DIAGNOSIS — K2289 Other specified disease of esophagus: Secondary | ICD-10-CM

## 2023-12-16 DIAGNOSIS — K2950 Unspecified chronic gastritis without bleeding: Secondary | ICD-10-CM

## 2023-12-16 DIAGNOSIS — K2951 Unspecified chronic gastritis with bleeding: Secondary | ICD-10-CM | POA: Diagnosis not present

## 2023-12-16 DIAGNOSIS — Z860101 Personal history of adenomatous and serrated colon polyps: Secondary | ICD-10-CM

## 2023-12-16 DIAGNOSIS — D125 Benign neoplasm of sigmoid colon: Secondary | ICD-10-CM | POA: Diagnosis not present

## 2023-12-16 DIAGNOSIS — K3189 Other diseases of stomach and duodenum: Secondary | ICD-10-CM | POA: Diagnosis not present

## 2023-12-16 DIAGNOSIS — K2970 Gastritis, unspecified, without bleeding: Secondary | ICD-10-CM

## 2023-12-16 DIAGNOSIS — Z860100 Personal history of colon polyps, unspecified: Secondary | ICD-10-CM

## 2023-12-16 MED ORDER — SODIUM CHLORIDE 0.9 % IV SOLN: 500.0000 mL | Freq: Once | INTRAVENOUS | Status: DC

## 2023-12-16 NOTE — Progress Notes (Signed)
 [facility] History and Physical   Primary Care Physician:  Joycelyn Rua, MD   Reason for Procedure:   Iron deficiency, history of colon polyps  Plan:    EGD and colonoscopy     HPI: Elizabeth Stone is a 78 y.o. female  here for EGD and colonoscopy - recent IDA, and last colonoscopy 11/2017 with SSP removed.   Takes nexium for history of GERD. No prior EGD. Denies bowel problems currently.  Otherwise feels well without any cardiopulmonary symptoms.   I have discussed risks / benefits of anesthesia and endoscopic procedure with Elizabeth Stone and they wish to proceed with the exams as outlined today.    Past Medical History:  Diagnosis Date   Acute blood loss anemia    hx   Anxiety    Arthritis    Balance problem    Bipolar disorder (HCC)    Cochlear implant in place    Cochlear implant in place    right ear   Depression    Dermatitis    on face   Dyspnea    Fibromyalgia    GERD (gastroesophageal reflux disease)    Hard of hearing    Hearing aid worn    left ear   Hearing impaired    has cochlear implant   HTN (hypertension)    Hypokalemia    Meniere disease    takes HCTZ for her inner ear problem   Mixed hyperlipidemia 09/27/2013   Osteoporosis    Pneumonia 11/2016   Pre-diabetes    Primary localized osteoarthritis of left knee 11/16/2018   Primary localized osteoarthritis of right knee    Slow transit constipation    Unsteady gait     Past Surgical History:  Procedure Laterality Date   ABDOMINAL HYSTERECTOMY  1986   bladder tack  2007   COCHLEAR IMPLANT     2007   COLONOSCOPY     COLONOSCOPY     HAMMER TOE SURGERY  2014   left foot   REVERSE SHOULDER ARTHROPLASTY Right 12/13/2014   dr Ave Filter   REVERSE SHOULDER ARTHROPLASTY Right 12/13/2014   Procedure: REVERSE SHOULDER ARTHROPLASTY rt;  Surgeon: Mable Paris, MD;  Location: [facility] OR;  Service: Orthopedics;  Laterality: Right;  Right reverse total shoulder replacement   REVERSE  SHOULDER ARTHROPLASTY Left 01/07/2017   Procedure: REVERSE LEFT TOTAL SHOULDER ARTHROPLASTY;  Surgeon: Whipkey Broom, MD;  Location: [facility] OR;  Service: Orthopedics;  Laterality: Left;  Left reveres total shoulder arthroplasty   TOTAL KNEE ARTHROPLASTY Right 12/16/2015   Procedure: TOTAL KNEE ARTHROPLASTY;  Surgeon: Salvatore Marvel, MD;  Location: [facility] OR;  Service: Orthopedics;  Laterality: Right;   TOTAL KNEE ARTHROPLASTY Left 11/28/2018   Procedure: LEFT TOTAL KNEE ARTHROPLASTY;  Surgeon: Salvatore Marvel, MD;  Location: [facility] OR;  Service: Orthopedics;  Laterality: Left;    Prior to Admission medications   Medication Sig Start Date End Date Taking? Authorizing Provider  acetaminophen (TYLENOL) 325 MG tablet Take 1 tablet (325 mg total) by mouth every 4 (four) hours as needed for mild pain (or temp > 37.5 C (99.5 F)). 10/20/22   Angiulli, Mcarthur Rossetti, PA-C  albuterol (VENTOLIN HFA) 108 (90 Base) MCG/ACT inhaler Inhale 1-2 puffs into the lungs every 6 (six) hours as needed. 11/19/22   Parrett, Virgel Bouquet, NP  alendronate (FOSAMAX) 70 MG tablet Take 70 mg by mouth once a week.    [provider]  Ascorbic Acid (VITAMIN C ER PO) Vitamin C  [provider]  Asenapine Maleate 10 MG SUBL Place 1 tablet under the tongue at bedtime. 09/06/22   [provider]  aspirin EC 81 MG tablet Take 1 tablet (81 mg total) by mouth daily. Swallow whole. 10/12/22   de Saintclair Halsted, Cortney E, NP  atorvastatin (LIPITOR) 20 MG tablet Take 1 tablet (20 mg total) by mouth at bedtime. 10/20/22   Angiulli, Mcarthur Rossetti, PA-C  B Complex-C (B-COMPLEX WITH VITAMIN C) tablet Take 1 tablet by mouth daily. 10/20/22   Angiulli, Mcarthur Rossetti, PA-C  buPROPion (WELLBUTRIN XL) 300 MG 24 hr tablet Take 1 tablet (300 mg total) by mouth daily. 10/20/22   Angiulli, Mcarthur Rossetti, PA-C  cetirizine (ZYRTEC) 10 MG chewable tablet Chew 10 mg by mouth at bedtime.    [provider]  CHLORTHALIDONE PO     [provider]  clonazePAM  (KLONOPIN) 1 MG tablet Take 1 tablet (1 mg total) by mouth at bedtime. 10/20/22   Angiulli, Mcarthur Rossetti, PA-C  Dermatological Products, Misc. [facility] ROSACEA EX) Apply topically Once daily 08/21/20   [provider]  DICLOFENAC POTASSIUM PO Diclofenac Potassium    [provider]  DULoxetine (CYMBALTA) 60 MG capsule Take 1 capsule (60 mg total) by mouth 2 (two) times daily. 10/20/22   Angiulli, Mcarthur Rossetti, PA-C  esomeprazole (NEXIUM) 20 MG capsule Take 1 capsule (20 mg total) by mouth daily. 10/20/22   Angiulli, Mcarthur Rossetti, PA-C  fluocinonide ointment (LIDEX) 0.05 %     [provider]  FLUZONE HIGH-DOSE 0.5 ML injection  08/01/23   [provider]  furosemide (LASIX) 20 MG tablet Take 20 mg by mouth daily.    [provider]  gabapentin (NEURONTIN) 300 MG capsule Take 1 capsule (300 mg total) by mouth at bedtime. 10/20/22   Angiulli, Mcarthur Rossetti, PA-C  HYDROcodone-acetaminophen (NORCO) 10-325 MG tablet Take 1 tablet by mouth every 12 (twelve) hours as needed for severe pain. 10/20/22   Angiulli, Mcarthur Rossetti, PA-C  ibuprofen (ADVIL) 600 MG tablet  09/30/20   [provider]  lamoTRIgine (LAMICTAL) 150 MG tablet Take 2 tablets (300 mg total) by mouth daily. 10/20/22   Angiulli, Mcarthur Rossetti, PA-C  lamoTRIgine (LAMICTAL) 25 MG tablet     [provider]  LIDODERM 5 % SMARTSIG:1-3 Patch(s) Topical Daily 07/02/23   [provider]  losartan (COZAAR) 100 MG tablet Take 1 tablet (100 mg total) by mouth daily. 10/20/22   Angiulli, Mcarthur Rossetti, PA-C  metFORMIN (GLUCOPHAGE-XR) 750 MG 24 hr tablet Take 1 tablet (750 mg total) by mouth every evening. 10/20/22   Angiulli, Mcarthur Rossetti, PA-C  metoprolol succinate (TOPROL-XL) 25 MG 24 hr tablet Take 1 tablet (25 mg total) by mouth daily. 09/07/23   Jake Bathe, MD  molnupiravir EUA (LAGEVRIO) 200 MG CAPS capsule Take 4 capsules by mouth every 12 hours for 5 days 06/15/23     montelukast (SINGULAIR) 10 MG tablet Take 1 tablet  (10 mg total) by mouth at bedtime. 10/20/22   Angiulli, Mcarthur Rossetti, PA-C  PANTOPRAZOLE SODIUM PO     [provider]  potassium chloride SA (KLOR-CON M) 20 MEQ tablet Take 20 mEq by mouth daily. 11/09/22   [provider]  Spacer/Aero-Holding Deretha Emory DEVI 1 each by Does not apply route as needed. 11/20/22   Parrett, Virgel Bouquet, NP  Sulfacetamide Sodium 10 % LIQD  11/11/21   [provider]  VITAMIN D PO Take 1,000 Units by mouth daily.  [provider]  Vitamin D, Ergocalciferol, (DRISDOL) 1.25 MG (50000 UNIT) CAPS capsule Take 1 capsule (50,000 Units total) by mouth every 7 (seven) days. 10/20/22   Angiulli, Mcarthur Rossetti, PA-C    Current Outpatient Medications  Medication Sig Dispense Refill   acetaminophen (TYLENOL) 325 MG tablet Take 1 tablet (325 mg total) by mouth every 4 (four) hours as needed for mild pain (or temp > 37.5 C (99.5 F)).     albuterol (VENTOLIN HFA) 108 (90 Base) MCG/ACT inhaler Inhale 1-2 puffs into the lungs every 6 (six) hours as needed. 8 g 2   alendronate (FOSAMAX) 70 MG tablet Take 70 mg by mouth once a week.     Ascorbic Acid (VITAMIN C ER PO) Vitamin C     Asenapine Maleate 10 MG SUBL Place 1 tablet under the tongue at bedtime.     aspirin EC 81 MG tablet Take 1 tablet (81 mg total) by mouth daily. Swallow whole. 30 tablet 12   atorvastatin (LIPITOR) 20 MG tablet Take 1 tablet (20 mg total) by mouth at bedtime. 30 tablet 0   B Complex-C (B-COMPLEX WITH VITAMIN C) tablet Take 1 tablet by mouth daily. 30 tablet 0   buPROPion (WELLBUTRIN XL) 300 MG 24 hr tablet Take 1 tablet (300 mg total) by mouth daily. 30 tablet 0   cetirizine (ZYRTEC) 10 MG chewable tablet Chew 10 mg by mouth at bedtime.     CHLORTHALIDONE PO      clonazePAM (KLONOPIN) 1 MG tablet Take 1 tablet (1 mg total) by mouth at bedtime. 30 tablet 0   Dermatological Products, Misc. (DERMAREST ROSACEA EX) Apply topically Once daily     DICLOFENAC POTASSIUM PO Diclofenac Potassium      DULoxetine (CYMBALTA) 60 MG capsule Take 1 capsule (60 mg total) by mouth 2 (two) times daily. 60 capsule 0   esomeprazole (NEXIUM) 20 MG capsule Take 1 capsule (20 mg total) by mouth daily. 30 capsule 0   fluocinonide ointment (LIDEX) 0.05 %      FLUZONE HIGH-DOSE 0.5 ML injection      furosemide (LASIX) 20 MG tablet Take 20 mg by mouth daily.     gabapentin (NEURONTIN) 300 MG capsule Take 1 capsule (300 mg total) by mouth at bedtime. 30 capsule 0   HYDROcodone-acetaminophen (NORCO) 10-325 MG tablet Take 1 tablet by mouth every 12 (twelve) hours as needed for severe pain. 30 tablet 0   ibuprofen (ADVIL) 600 MG tablet      lamoTRIgine (LAMICTAL) 150 MG tablet Take 2 tablets (300 mg total) by mouth daily. 60 tablet 0   lamoTRIgine (LAMICTAL) 25 MG tablet      LIDODERM 5 % SMARTSIG:1-3 Patch(s) Topical Daily     losartan (COZAAR) 100 MG tablet Take 1 tablet (100 mg total) by mouth daily. 30 tablet 0   metFORMIN (GLUCOPHAGE-XR) 750 MG 24 hr tablet Take 1 tablet (750 mg total) by mouth every evening. 30 tablet 0   metoprolol succinate (TOPROL-XL) 25 MG 24 hr tablet Take 1 tablet (25 mg total) by mouth daily. 90 tablet 3   molnupiravir EUA (LAGEVRIO) 200 MG CAPS capsule Take 4 capsules by mouth every 12 hours for 5 days 40 capsule 0   montelukast (SINGULAIR) 10 MG tablet Take 1 tablet (10 mg total) by mouth at bedtime. 30 tablet 0   PANTOPRAZOLE SODIUM PO      potassium chloride SA (KLOR-CON M) 20 MEQ tablet Take 20 mEq by mouth daily.  Spacer/Aero-Holding Chambers DEVI 1 each by Does not apply route as needed. 1 each 0   Sulfacetamide Sodium 10 % LIQD      VITAMIN D PO Take 1,000 Units by mouth daily.     Vitamin D, Ergocalciferol, (DRISDOL) 1.25 MG (50000 UNIT) CAPS capsule Take 1 capsule (50,000 Units total) by mouth every 7 (seven) days. 5 capsule 0   Current Facility-Administered Medications  Medication Dose Route Frequency Provider Last Rate Last Admin   0.9 %  sodium chloride infusion   500 mL Intravenous Once Ayson Cherubini, Willaim Rayas, MD        Allergies as of 12/16/2023 - Review Complete 12/16/2023  Allergen Reaction Noted   Latex Hives and Rash 12/05/2015   Adhesive [tape] Rash and Other (See Comments) 11/28/2018    Family History  Problem Relation Age of Onset   Heart disease Mother    Breast cancer Mother    Cervical cancer Mother    Diabetes Mother    Heart disease Father    Diabetes Maternal Aunt    Colon cancer Neg Hx    Esophageal cancer Neg Hx    Rectal cancer Neg Hx    Stomach cancer Neg Hx    Liver cancer Neg Hx    Pancreatic cancer Neg Hx     Social History   Socioeconomic History   Marital status: Widowed    Spouse name: Not on file   Number of children: 2   Years of education: college   Highest education level: Not on file  Occupational History   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   Right-handed.   Rare caffeine use.   Social Drivers of Corporate investment banker Strain: Not on file  Food Insecurity: No Food Insecurity (10/09/2022)   Hunger Vital Sign    Worried About Running Out of Food in the Last Year: Never true    Ran Out of Food in the Last Year: Never true  Transportation Needs: Unmet Transportation Needs (10/09/2022)   PRAPARE - Administrator, Civil Service (Medical): Yes    Lack of Transportation (Non-Medical): Yes  Physical Activity: Not on file  Stress: Not on file  Social Connections: Unknown  (03/02/2022)   Received from [facility]   Social Network    Social Network: Not on file  Intimate Partner Violence: Not At Risk (10/09/2022)   Humiliation, Afraid, Rape, and Kick questionnaire    Fear of Current or Ex-Partner: No    Emotionally Abused: No    Physically Abused: No    Sexually Abused: No    Review of Systems: All other review of systems negative except as mentioned in the HPI.  Physical Exam: Vital signs BP (!) 150/71 (Cuff Size: Normal)   Pulse 85   Temp 99.7 F (37.6 C)   Ht 5'  1" (1.549 m)   Wt 169 lb (76.7 kg)   LMP  (LMP Unknown)   SpO2 92%   BMI 31.93 kg/m   General:   Alert,  Well-developed, pleasant and cooperative in NAD Lungs:  Clear throughout to auscultation.   Heart:  Regular rate and rhythm Abdomen:  Soft, nontender and nondistended.   Neuro/Psych:  Alert and cooperative. Normal mood and affect. A and O x 3  Harlin Rain, MD [facility]

## 2023-12-16 NOTE — Op Note (Signed)
 [facility] Patient Name: Elizabeth Stone Procedure Date: 12/16/2023 3:27 PM MRN: [id number] Endoscopist: Viviann Spare P. Adela Lank , MD, [id number] Age: 78 Referring MD:  Date of Birth: 07-12-1946 Gender: Female Account #: [id number] Procedure:                Colonoscopy Indications:              Iron deficiency anemia, history of colon polyps on                            last exam 11/2017 - on aspirin 81mg  daily and other                            NSAIDs PRN Medicines:                Monitored Anesthesia Care Procedure:                Pre-Anesthesia Assessment:                           - Prior to the procedure, a History and Physical                            was performed, and patient medications and                            allergies were reviewed. The patient's tolerance of                            previous anesthesia was also reviewed. The risks                            and benefits of the procedure and the sedation                            options and risks were discussed with the patient.                            All questions were answered, and informed consent                            was obtained. Prior Anticoagulants: The patient has                            taken no anticoagulant or antiplatelet agents. ASA                            Grade Assessment: III - A patient with severe                            systemic disease. After reviewing the risks and                            benefits, the patient was deemed in satisfactory  condition to undergo the procedure.                           After obtaining informed consent, the colonoscope                            was passed under direct vision. Throughout the                            procedure, the patient's blood pressure, pulse, and                            oxygen saturations were monitored continuously. The                            [id number]-HQ190L Colonoscope [id number] was  introduced                            through the anus and advanced to the the terminal                            ileum, with identification of the appendiceal                            orifice and IC valve. The colonoscopy was performed                            without difficulty. The patient tolerated the                            procedure well. The quality of the bowel                            preparation was adequate. The terminal ileum,                            ileocecal valve, appendiceal orifice, and rectum                            were photographed. Scope In: 3:43:13 PM Scope Out: 4:06:13 PM Scope Withdrawal Time: 0 hours 16 minutes 36 seconds  Total Procedure Duration: 0 hours 23 minutes 0 seconds  Findings:                 The perianal and digital rectal examinations were                            normal.                           The terminal ileum appeared normal.                           Patchy mild inflammation characterized by erosions,  erythema, friability and granularity was found in                            the transverse colon, in the proximal transverse                            colon, at the hepatic flexure, in the ascending                            colon and in the cecum. Biopsies were taken with a                            cold forceps for histology.                           Seven sessile polyps were found in the transverse                            colon. The polyps were 3 to 5 mm in size. These                            polyps were removed with a cold snare. Resection                            and retrieval were complete.                           Three sessile polyps were found in the splenic                            flexure. The polyps were 3 to 4 mm in size. These                            polyps were removed with a cold snare. Resection                            and retrieval were complete.                            Three sessile polyps were found in the sigmoid                            colon. The polyps were 3 mm in size. These polyps                            were removed with a cold snare. Resection and                            retrieval were complete.                           Internal hemorrhoids were found during retroflexion.  The exam was otherwise without abnormality. Complications:            No immediate complications. Estimated blood loss:                            Minimal. Estimated Blood Loss:     Estimated blood loss was minimal. Impression:               - The examined portion of the ileum was normal.                           - Patchy mild inflammation was found in the                            transverse colon, in the proximal transverse colon,                            at the hepatic flexure, in the ascending colon and                            in the cecum. Biopsied.                           - Seven 3 to 5 mm polyps in the transverse colon,                            removed with a cold snare. Resected and retrieved.                           - Three 3 to 4 mm polyps at the splenic flexure,                            removed with a cold snare. Resected and retrieved.                           - Three 3 mm polyps in the sigmoid colon, removed                            with a cold snare. Resected and retrieved.                           - Internal hemorrhoids.                           - The examination was otherwise normal.                           Mild patchy colitis as outlined above - possible                            could be due to NSAIDs, vs. mild Crohn's. Will                            await biopsy results. Recommendation:           -  Patient has a contact number available for                            emergencies. The signs and symptoms of potential                            delayed complications were discussed with the                             patient. Return to normal activities tomorrow.                            Written discharge instructions were provided to the                            patient.                           - Resume previous diet.                           - Continue present medications.                           - Okay to continue baby aspirin but would avoid all                            other NSAIDs.                           - Await pathology results. Viviann Spare P. Terri Malerba, MD 12/16/2023 4:16:42 PM This report has been signed electronically.

## 2023-12-16 NOTE — Op Note (Signed)
 [facility] Patient Name: Elizabeth Stone Procedure Date: 12/16/2023 3:28 PM MRN: [id number] Endoscopist: Viviann Spare P. Adela Lank , MD, [id number] Age: 78 Referring MD:  Date of Birth: 1946/08/23 Gender: Female Account #: [id number] Procedure:                Upper GI endoscopy Indications:              Iron deficiency anemia - on aspirin and possibly                            other NSAIDs PRN, history of GERD on nexium 20mg  /                            day Medicines:                Monitored Anesthesia Care Procedure:                Pre-Anesthesia Assessment:                           - Prior to the procedure, a History and Physical                            was performed, and patient medications and                            allergies were reviewed. The patient's tolerance of                            previous anesthesia was also reviewed. The risks                            and benefits of the procedure and the sedation                            options and risks were discussed with the patient.                            All questions were answered, and informed consent                            was obtained. Prior Anticoagulants: The patient has                            taken no anticoagulant or antiplatelet agents. ASA                            Grade Assessment: III - A patient with severe                            systemic disease. After reviewing the risks and                            benefits, the patient was deemed in satisfactory  condition to undergo the procedure.                           After obtaining informed consent, the endoscope was                            passed under direct vision. Throughout the                            procedure, the patient's blood pressure, pulse, and                            oxygen saturations were monitored continuously. The                            GIF [id number] #[id number] was introduced through  the                            mouth, and advanced to the second part of duodenum.                            The upper GI endoscopy was accomplished without                            difficulty. The patient tolerated the procedure                            well. Scope In: Scope Out: Findings:                 Esophagogastric landmarks were identified: the                            Z-line was found at 37 cm, the gastroesophageal                            junction was found at 37 cm and the upper extent of                            the gastric folds was found at 38 cm from the                            incisors.                           There were two benign gastric inlet patches in the                            proximal esophagus. The exam of the esophagus was                            otherwise normal.                           A few angiodysplastic lesions with no bleeding  were                            found in the cardia just inferior to the                            gastroesophageal junction, in the cardia and in the                            gastric fundus.                           Mild inflammation characterized by adherent blood                            and erythema was found in the gastric fundus and in                            the gastric body. Biopsies were taken with a cold                            forceps for Helicobacter pylori testing.                           The exam of the stomach was otherwise normal.                           Two small angiodysplastic lesions without bleeding                            were found in the second portion of the duodenum.                           The exam of the duodenum was otherwise normal. Complications:            No immediate complications. Estimated blood loss:                            Minimal. Estimated Blood Loss:     Estimated blood loss was minimal. Impression:               - Esophagogastric landmarks  identified.                           - Two benign gastric inlet patches in the proximal                            esophagus.                           - Normal esophagus otherwise.                           - A few non-bleeding angiodysplastic lesions in the  stomach / cardia / GEJ.                           - Gastritis. Biopsied.                           - Normal stomach otherwise.                           - Two non-bleeding angiodysplastic lesions in the                            duodenum.                           - Normal duodenum otherwise.                           Possible gastritis / AVMs are contributing to iron                            deficiency. Recommendation:           - Patient has a contact number available for                            emergencies. The signs and symptoms of potential                            delayed complications were discussed with the                            patient. Return to normal activities tomorrow.                            Written discharge instructions were provided to the                            patient.                           - Resume previous diet.                           - Continue present medications.                           - Increase nexium to twice daily dosing                           - Avoid NSAIDs other than baby aspirin (recent                            neurologic symptoms)                           - Await pathology results. Viviann Spare P. Adela Lank, MD 12/16/2023 4:20:46 PM This report has been signed electronically.

## 2023-12-16 NOTE — Patient Instructions (Addendum)
 - Resume previous diet - Continue present medications. - Await pathology results - Increase nexium to twice daily dosing - Avoid NSAIDs other than baby aspirin (recent neurologic symptoms) - Okay to continue baby aspirin    YOU HAD AN ENDOSCOPIC PROCEDURE TODAY AT THE [facility]:   Refer to the procedure report that was given to you for any specific questions about what was found during the examination.  If the procedure report does not answer your questions, please call your gastroenterologist to clarify.  If you requested that your care partner not be given the details of your procedure findings, then the procedure report has been included in a sealed envelope for you to review at your convenience later.  YOU SHOULD EXPECT: Some feelings of bloating in the abdomen. Passage of more gas than usual.  Walking can help get rid of the air that was put into your GI tract during the procedure and reduce the bloating. If you had a lower endoscopy (such as a colonoscopy or flexible sigmoidoscopy) you may notice spotting of blood in your stool or on the toilet paper. If you underwent a bowel prep for your procedure, you may not have a normal bowel movement for a few days.  Please Note:  You might notice some irritation and congestion in your nose or some drainage.  This is from the oxygen used during your procedure.  There is no need for concern and it should clear up in a day or so.  SYMPTOMS TO REPORT IMMEDIATELY:  Following lower endoscopy (colonoscopy or flexible sigmoidoscopy):  Excessive amounts of blood in the stool  Significant tenderness or worsening of abdominal pains  Swelling of the abdomen that is new, acute  Fever of 100F or higher  Following upper endoscopy (EGD)  Vomiting of blood or coffee ground material  New chest pain or pain under the shoulder blades  Painful or persistently difficult swallowing  New shortness of breath  Fever of 100F or higher  Black,  tarry-looking stools  For urgent or emergent issues, a gastroenterologist can be reached at any hour by calling [phone]. Do not use MyChart messaging for urgent concerns.    DIET:  We do recommend a small meal at first, but then you may proceed to your regular diet.  Drink plenty of fluids but you should avoid alcoholic beverages for 24 hours.  ACTIVITY:  You should plan to take it easy for the rest of today and you should NOT DRIVE or use heavy machinery until tomorrow (because of the sedation medicines used during the test).    FOLLOW UP: Our staff will call the number listed on your records the next business day following your procedure.  We will call around 7:15- 8:00 am to check on you and address any questions or concerns that you may have regarding the information given to you following your procedure. If we do not reach you, we will leave a message.     If any biopsies were taken you will be contacted by phone or by letter within the next 1-3 weeks.  Please call us at [phone] if you have not heard about the biopsies in 3 weeks.    SIGNATURES/CONFIDENTIALITY: You and/or your care partner have signed paperwork which will be entered into your electronic medical record.  These signatures attest to the fact that that the information above on your After Visit Summary has been reviewed and is understood.  Full responsibility of the confidentiality of this discharge  information lies with you and/or your care-partner.

## 2023-12-16 NOTE — Progress Notes (Signed)
 Called to room to assist during endoscopic procedure.  Patient ID and intended procedure confirmed with present staff. Received instructions for my participation in the procedure from the performing physician.

## 2023-12-16 NOTE — Progress Notes (Signed)
 Sedate, gd SR, tolerated procedure well, VSS, report to RN

## 2023-12-17 ENCOUNTER — Telehealth: Payer: Self-pay | Admitting: *Deleted

## 2023-12-17 NOTE — Telephone Encounter (Signed)
  Follow up Call-     12/16/2023    2:55 PM 06/09/2021   12:55 PM  Call back number  Post procedure Call Back phone  # [phone]  Permission to leave phone message Yes    Left message on machine to call back if she has any issues

## 2023-12-21 LAB — SURGICAL PATHOLOGY

## 2023-12-22 ENCOUNTER — Other Ambulatory Visit: Payer: Self-pay

## 2023-12-22 DIAGNOSIS — E119 Type 2 diabetes mellitus without complications: Secondary | ICD-10-CM | POA: Diagnosis not present

## 2023-12-22 DIAGNOSIS — D509 Iron deficiency anemia, unspecified: Secondary | ICD-10-CM

## 2023-12-22 MED ORDER — ESOMEPRAZOLE MAGNESIUM 20 MG PO CPDR: 20.0000 mg | DELAYED_RELEASE_CAPSULE | Freq: Two times a day (BID) | ORAL | 2 refills | Status: DC

## 2023-12-29 DIAGNOSIS — D485 Neoplasm of uncertain behavior of skin: Secondary | ICD-10-CM | POA: Diagnosis not present

## 2023-12-29 DIAGNOSIS — L820 Inflamed seborrheic keratosis: Secondary | ICD-10-CM | POA: Diagnosis not present

## 2023-12-29 DIAGNOSIS — L821 Other seborrheic keratosis: Secondary | ICD-10-CM | POA: Diagnosis not present

## 2024-01-05 DIAGNOSIS — I10 Essential (primary) hypertension: Secondary | ICD-10-CM | POA: Diagnosis not present

## 2024-01-05 DIAGNOSIS — E1121 Type 2 diabetes mellitus with diabetic nephropathy: Secondary | ICD-10-CM | POA: Diagnosis not present

## 2024-01-05 DIAGNOSIS — M810 Age-related osteoporosis without current pathological fracture: Secondary | ICD-10-CM | POA: Diagnosis not present

## 2024-01-05 DIAGNOSIS — E785 Hyperlipidemia, unspecified: Secondary | ICD-10-CM | POA: Diagnosis not present

## 2024-01-06 DIAGNOSIS — E782 Mixed hyperlipidemia: Secondary | ICD-10-CM | POA: Diagnosis not present

## 2024-01-06 DIAGNOSIS — G8929 Other chronic pain: Secondary | ICD-10-CM | POA: Diagnosis not present

## 2024-01-06 DIAGNOSIS — E113392 Type 2 diabetes mellitus with moderate nonproliferative diabetic retinopathy without macular edema, left eye: Secondary | ICD-10-CM | POA: Diagnosis not present

## 2024-01-06 DIAGNOSIS — I10 Essential (primary) hypertension: Secondary | ICD-10-CM | POA: Diagnosis not present

## 2024-01-11 DIAGNOSIS — H57813 Brow ptosis, bilateral: Secondary | ICD-10-CM | POA: Diagnosis not present

## 2024-01-11 DIAGNOSIS — H02833 Dermatochalasis of right eye, unspecified eyelid: Secondary | ICD-10-CM | POA: Diagnosis not present

## 2024-01-11 DIAGNOSIS — H40013 Open angle with borderline findings, low risk, bilateral: Secondary | ICD-10-CM | POA: Diagnosis not present

## 2024-01-11 DIAGNOSIS — E119 Type 2 diabetes mellitus without complications: Secondary | ICD-10-CM | POA: Diagnosis not present

## 2024-01-13 ENCOUNTER — Ambulatory Visit: Admitting: Podiatry

## 2024-01-18 ENCOUNTER — Telehealth: Payer: Self-pay

## 2024-01-18 ENCOUNTER — Other Ambulatory Visit (HOSPITAL_COMMUNITY): Payer: Self-pay

## 2024-01-18 NOTE — Telephone Encounter (Signed)
 Yes that is fine, thank you

## 2024-01-18 NOTE — Telephone Encounter (Signed)
 Rec'd fax from [employer]. Esomeprazole has a quantity limit. They will only pay for 30 capsules for 30 day supply "unless you get an exception from your plan". Medicare Part D. Patient was given a temporary supply.

## 2024-01-18 NOTE — Telephone Encounter (Signed)
 Is patient able to do 40 mg once daily? This is covered for $6.00. If not, I can start the pa

## 2024-01-19 MED ORDER — ESOMEPRAZOLE MAGNESIUM 40 MG PO CPDR: 40.0000 mg | DELAYED_RELEASE_CAPSULE | Freq: Every day | ORAL | 2 refills | Status: DC

## 2024-01-19 NOTE — Telephone Encounter (Signed)
 No, thank you

## 2024-01-19 NOTE — Telephone Encounter (Signed)
 Called and left message for patient that we are changing to 40mg  once daily dosing.

## 2024-01-19 NOTE — Telephone Encounter (Signed)
 New prescription sent to pharmacy

## 2024-01-24 NOTE — Telephone Encounter (Signed)
 Patient was unclear about the voicemail she received about her insurance only covering 1 pill a day.  Explained that Dr. Adela Lank  approved changing her to 40 mg once a day instead of 20 mg twice a day due to her insurance coverage. Patient expressed understanding.

## 2024-01-26 DIAGNOSIS — M5416 Radiculopathy, lumbar region: Secondary | ICD-10-CM | POA: Diagnosis not present

## 2024-02-11 ENCOUNTER — Other Ambulatory Visit

## 2024-02-11 DIAGNOSIS — D509 Iron deficiency anemia, unspecified: Secondary | ICD-10-CM | POA: Diagnosis not present

## 2024-02-11 LAB — CBC WITH DIFFERENTIAL/PLATELET
Basophils Absolute: 0 10*3/uL (ref 0.0–0.1)
Basophils Relative: 0.6 % (ref 0.0–3.0)
Eosinophils Absolute: 0.1 10*3/uL (ref 0.0–0.7)
Eosinophils Relative: 1.5 % (ref 0.0–5.0)
HCT: 38.6 % (ref 36.0–46.0)
Hemoglobin: 12.6 g/dL (ref 12.0–15.0)
Lymphocytes Relative: 23.8 % (ref 12.0–46.0)
Lymphs Abs: 1.3 10*3/uL (ref 0.7–4.0)
MCHC: 32.6 g/dL (ref 30.0–36.0)
MCV: 82.8 fl (ref 78.0–100.0)
Monocytes Absolute: 0.5 10*3/uL (ref 0.1–1.0)
Monocytes Relative: 9.3 % (ref 3.0–12.0)
Neutro Abs: 3.7 10*3/uL (ref 1.4–7.7)
Neutrophils Relative %: 64.8 % (ref 43.0–77.0)
Platelets: 137 10*3/uL — ABNORMAL LOW (ref 150.0–400.0)
RBC: 4.66 Mil/uL (ref 3.87–5.11)
RDW: 17.5 % — ABNORMAL HIGH (ref 11.5–15.5)
WBC: 5.6 10*3/uL (ref 4.0–10.5)

## 2024-02-11 LAB — IBC + FERRITIN
Ferritin: 19.7 ng/mL (ref 10.0–291.0)
Iron: 37 ug/dL — ABNORMAL LOW (ref 42–145)
Saturation Ratios: 8.1 % — ABNORMAL LOW (ref 20.0–50.0)
TIBC: 455 ug/dL — ABNORMAL HIGH (ref 250.0–450.0)
Transferrin: 325 mg/dL (ref 212.0–360.0)

## 2024-02-15 ENCOUNTER — Encounter: Payer: Self-pay | Admitting: Gastroenterology

## 2024-02-15 ENCOUNTER — Telehealth: Payer: Self-pay

## 2024-02-15 ENCOUNTER — Ambulatory Visit: Admitting: Gastroenterology

## 2024-02-15 VITALS — BP 124/64 | HR 68 | Ht 61.0 in

## 2024-02-15 DIAGNOSIS — K559 Vascular disorder of intestine, unspecified: Secondary | ICD-10-CM

## 2024-02-15 DIAGNOSIS — K219 Gastro-esophageal reflux disease without esophagitis: Secondary | ICD-10-CM

## 2024-02-15 DIAGNOSIS — Z79899 Other long term (current) drug therapy: Secondary | ICD-10-CM

## 2024-02-15 DIAGNOSIS — R053 Chronic cough: Secondary | ICD-10-CM

## 2024-02-15 DIAGNOSIS — D509 Iron deficiency anemia, unspecified: Secondary | ICD-10-CM

## 2024-02-15 DIAGNOSIS — K5909 Other constipation: Secondary | ICD-10-CM | POA: Diagnosis not present

## 2024-02-15 DIAGNOSIS — R499 Unspecified voice and resonance disorder: Secondary | ICD-10-CM

## 2024-02-15 DIAGNOSIS — Z860100 Personal history of colon polyps, unspecified: Secondary | ICD-10-CM

## 2024-02-15 MED ORDER — LINACLOTIDE 72 MCG PO CAPS: 72.0000 ug | ORAL_CAPSULE | Freq: Every day | ORAL | 0 refills | Status: DC

## 2024-02-15 MED ORDER — ESOMEPRAZOLE MAGNESIUM 40 MG PO CPDR: 40.0000 mg | DELAYED_RELEASE_CAPSULE | Freq: Every day | ORAL | Status: DC

## 2024-02-15 NOTE — Progress Notes (Signed)
 HPI :  78 year old female here for follow-up visit for iron deficiency as well as other symptoms she would like to discuss today.  Recall she is hearing impaired status post cochlear implant, Mnire's disease, diabetes, history of stroke.  She had an EGD and colonoscopy with me for IDA in February.  She had some mild gastritis noted, along with some AVMs in her upper tract.  Biopsies for H. pylori negative.  Colonoscopy showed 13 polyps, the majority were adenomas, fortunately all small.  She also incidentally was noted to have some mild ischemic colitis as outlined in the procedure report below.  She denies any pain around the time of the procedure problems with diarrhea at baseline or blood in her stool.  She reminds me she has had problems with chronic constipation for years.  She typically has 2 bowel movements per week, very hard stools.  She has not been taking any iron supplementation due to her constipation and fears of worsening.  She has used some senna as needed but does not work well enough for her.  She states she does not like MiraLAX  as it created too loose of stool and she did not like that either.  She has not tried a fiber supplement.  She inquires about Linzess which she has heard about and wants to try that.  She does take oxycodone  periodically for joint pains, usually once or twice per week at most.  She denies any NSAID use other than aspirin .  She takes Tylenol  only as needed and that typically works pretty well for her.  She denies any family history of colon cancer.  She did smoke tobacco in the past, quit in 1992.  We had increased her Nexium  from 20 mg daily to 40 mg daily.  She states that typically controls her reflux pretty well and has not had much breakthrough.  She has occasional nausea but no vomiting.  She does complain of a raspy voice and voice changes recently, as well as chronic cough.  She has never seen ENT.  Of note we did blood work while she has not been  on any iron supplementation and her hemoglobin is normal, very mild iron deficiency on labs  PAST GI PROCEDURES:   Colonoscopy 11/30/2017: - The examined portion of the ileum was normal. - One 7 mm polyp in the ascending colon, removed with a cold snare. Resected and retrieved.  - One 4 mm polyp at the recto-sigmoid colon, removed with a cold snare. Resected and retrieved.  - Tortuous colon.  - The examination was otherwise normal on direct and retroflexion views - 5 year recall - SESSILE SERRATED POLYP (X3 FRAGMENTS). - HYPERPLASTIC POLYP. - NO DYSPLASIA OR MALIGNANCY.   EGD 12/16/23: - Esophagogastric landmarks were identified: the Z-line was found at 37 cm, the gastroesophageal junction was found at 37 cm and the upper extent of the gastric folds was found at 38 cm from the incisors. Findings: - There were two benign gastric inlet patches in the proximal esophagus. The exam of the esophagus was otherwise normal. - A few angiodysplastic lesions with no bleeding were found in the cardia just inferior to the gastroesophageal junction, in the cardia and in the gastric fundus. - Mild inflammation characterized by adherent blood and erythema was found in the gastric fundus and in the gastric body. Biopsies were taken with a cold forceps for Helicobacter pylori testing. - The exam of the stomach was otherwise normal. - Two small angiodysplastic lesions without bleeding  were found in the second portion of the duodenum. - The exam of the duodenum was otherwise normal.  Colonoscopy 12/16/23: - The perianal and digital rectal examinations were normal. - The terminal ileum appeared normal. - Patchy mild inflammation characterized by erosions, erythema, friability and granularity was found in the transverse colon, in the proximal transverse colon, at the hepatic flexure, in the ascending colon and in the cecum. Biopsies were taken with a cold forceps for histology. - Seven sessile polyps were found in the  transverse colon. The polyps were 3 to 5 mm in size. These polyps were removed with a cold snare. Resection and retrieval were complete. - Three sessile polyps were found in the splenic flexure. The polyps were 3 to 4 mm in size. These polyps were removed with a cold snare. Resection and retrieval were complete. - Three sessile polyps were found in the sigmoid colon. The polyps were 3 mm in size. These polyps were removed with a cold snare. Resection and retrieval were complete. - Internal hemorrhoids were found during retroflexion. - The exam was otherwise without abnormality.  FINAL DIAGNOSIS        1. Surgical [P], gastric antrum and body :       REACTIVE GASTROPATHY AND MILD CHRONIC GASTRITIS       NEGATIVE FOR H. PYLORI, INTESTINAL METAPLASIA, DYSPLASIA AND CARCINOMA        2. Surgical [P], colon, transverse, splenic flex, sigmoid, polyp (13) :       TUBULAR ADENOMA, 15 FRAGMENTS       HYPERPLASTIC POLYP       NEGATIVE FOR HIGH-GRADE DYSPLASIA AND CARCINOMA        3. Surgical [P], colon, cecum and ascending bx :       COLONIC MUCOSA SHOWING MUCOSAL ISCHEMIC INJURY    Component Ref Range & Units (hover) 4 d ago 4 mo ago 5 mo ago  Iron 37 Low  44 305 High   Transferrin 325.0 357.0 293.0  Saturation Ratios 8.1 Low  8.8 Low  74.4 High   Ferritin 19.7 29.9 22.5  TIBC 455.0 High  499.8 High  410.2     Component Ref Range & Units (hover) 4 d ago (02/11/24) 4 mo ago (09/24/23) 5 mo ago (09/02/23) 5 mo ago (08/26/23) 1 yr ago (10/16/22) 1 yr ago (10/15/22) 1 yr ago (10/14/22)  WBC 5.6 5.0 4.1 5.8 6.3 6.9 6.4  RBC 4.66 4.78 4.40 R 4.21 4.09 R 4.19 R 4.15 R  Hemoglobin 12.6 13.1 11.6 Low  11.6 Low  13.7 14.4 13.8  HCT 38.6 40.3 37.7 35.6 Low  39.6 39.7 39.6  MCV 82.8 84.2 85.7 R 84.7 96.8 R 94.7 R 95.4 R  MCHC 32.6 32.6 30.8 32.6 34.6 36.3 High  34.8  RDW 17.5 High  18.3 High  17.4 High  17.5 High  13.1 13.0 12.9  Platelets 137.0 Low  177.0 169 R 199.0 150 R 156 R 148 Low  R      Past Medical History:  Diagnosis Date   Acute blood loss anemia    hx   Anxiety    Arthritis    Balance problem    Bipolar disorder (HCC)    Cochlear implant in place    Cochlear implant in place    right ear   Depression    Dermatitis    on face   Dyspnea    Fibromyalgia    GERD (gastroesophageal reflux disease)    Hard of hearing  Hearing aid worn    left ear   Hearing impaired    has cochlear implant   HTN (hypertension)    Hypokalemia    Meniere disease    takes HCTZ for her inner ear problem   Mixed hyperlipidemia 09/27/2013   Osteoporosis    Pneumonia 11/2016   Pre-diabetes    Primary localized osteoarthritis of left knee 11/16/2018   Primary localized osteoarthritis of right knee    Slow transit constipation    Unsteady gait      Past Surgical History:  Procedure Laterality Date   ABDOMINAL HYSTERECTOMY  1986   bladder tack  2007   COCHLEAR IMPLANT     2007   COLONOSCOPY     COLONOSCOPY     HAMMER TOE SURGERY  2014   left foot   REVERSE SHOULDER ARTHROPLASTY Right 12/13/2014   dr Deeann Fare   REVERSE SHOULDER ARTHROPLASTY Right 12/13/2014   Procedure: REVERSE SHOULDER ARTHROPLASTY rt;  Surgeon: Derald Flattery, MD;  Location: [facility] OR;  Service: Orthopedics;  Laterality: Right;  Right reverse total shoulder replacement   REVERSE SHOULDER ARTHROPLASTY Left 01/07/2017   Procedure: REVERSE LEFT TOTAL SHOULDER ARTHROPLASTY;  Surgeon: Sammye Cristal, MD;  Location: [facility] OR;  Service: Orthopedics;  Laterality: Left;  Left reveres total shoulder arthroplasty   TOTAL KNEE ARTHROPLASTY Right 12/16/2015   Procedure: TOTAL KNEE ARTHROPLASTY;  Surgeon: Elly Habermann, MD;  Location: [facility] OR;  Service: Orthopedics;  Laterality: Right;   TOTAL KNEE ARTHROPLASTY Left 11/28/2018   Procedure: LEFT TOTAL KNEE ARTHROPLASTY;  Surgeon: Elly Habermann, MD;  Location: [facility] OR;  Service: Orthopedics;  Laterality: Left;   Family History  Problem Relation Age of Onset    Heart disease Mother    Breast cancer Mother    Cervical cancer Mother    Diabetes Mother    Heart disease Father    Diabetes Maternal Aunt    Colon cancer Neg Hx    Esophageal cancer Neg Hx    Rectal cancer Neg Hx    Stomach cancer Neg Hx    Liver cancer Neg Hx    Pancreatic cancer Neg Hx    Social History   Tobacco Use   Smoking status: Former    Current packs/day: 0.00    Average packs/day: 0.3 packs/day for 23.0 years (5.8 ttl pk-yrs)    Types: Cigarettes    Start date: 08/25/1970    Quit date: 08/25/1993    Years since quitting: 30.4   Smokeless tobacco: Never  Vaping Use   Vaping status: Never Used  Substance Use Topics   Alcohol use: Yes    Comment: 2 glasses of wine per night   Drug use: No   Current Outpatient Medications  Medication Sig Dispense Refill   acetaminophen  (TYLENOL ) 325 MG tablet Take 1 tablet (325 mg total) by mouth every 4 (four) hours as needed for mild pain (or temp > 37.5 C (99.5 F)).     albuterol  (VENTOLIN  HFA) 108 (90 Base) MCG/ACT inhaler Inhale 1-2 puffs into the lungs every 6 (six) hours as needed. 8 g 2   alendronate (FOSAMAX) 70 MG tablet Take 70 mg by mouth once a week.     Ascorbic Acid  (VITAMIN C  ER PO) Vitamin C      Asenapine  Maleate 10 MG SUBL Place 1 tablet under the tongue at bedtime.     aspirin  EC 81 MG tablet Take 1 tablet (81 mg total) by mouth daily. Swallow whole. 30 tablet 12   atorvastatin  (  LIPITOR) 20 MG tablet Take 1 tablet (20 mg total) by mouth at bedtime. 30 tablet 0   B Complex-C (B-COMPLEX WITH VITAMIN C ) tablet Take 1 tablet by mouth daily. 30 tablet 0   buPROPion  (WELLBUTRIN  XL) 300 MG 24 hr tablet Take 1 tablet (300 mg total) by mouth daily. 30 tablet 0   cetirizine (ZYRTEC) 10 MG chewable tablet Chew 10 mg by mouth at bedtime.     clonazePAM  (KLONOPIN ) 1 MG tablet Take 1 tablet (1 mg total) by mouth at bedtime. 30 tablet 0   Dermatological Products, Misc. (DERMAREST ROSACEA EX)      DULoxetine  (CYMBALTA ) 60 MG  capsule Take 1 capsule (60 mg total) by mouth 2 (two) times daily. 60 capsule 0   FLUZONE HIGH-DOSE 0.5 ML injection      furosemide (LASIX) 20 MG tablet Take 20 mg by mouth daily.     gabapentin  (NEURONTIN ) 300 MG capsule Take 1 capsule (300 mg total) by mouth at bedtime. 30 capsule 0   HYDROcodone -acetaminophen  (NORCO) 10-325 MG tablet Take 1 tablet by mouth every 12 (twelve) hours as needed for severe pain. 30 tablet 0   ibuprofen  (ADVIL ) 600 MG tablet      lamoTRIgine  (LAMICTAL ) 150 MG tablet Take 2 tablets (300 mg total) by mouth daily. 60 tablet 0   lamoTRIgine  (LAMICTAL ) 25 MG tablet      losartan  (COZAAR ) 100 MG tablet Take 1 tablet (100 mg total) by mouth daily. 30 tablet 0   metFORMIN  (GLUCOPHAGE -XR) 750 MG 24 hr tablet Take 1 tablet (750 mg total) by mouth every evening. 30 tablet 0   metoprolol  succinate (TOPROL -XL) 25 MG 24 hr tablet Take 1 tablet (25 mg total) by mouth daily. 90 tablet 3   montelukast  (SINGULAIR ) 10 MG tablet Take 1 tablet (10 mg total) by mouth at bedtime. 30 tablet 0   potassium chloride  SA (KLOR-CON  M) 20 MEQ tablet Take 20 mEq by mouth daily.     Sulfacetamide Sodium 10 % LIQD      VITAMIN D  PO Take 1,000 Units by mouth daily.     Vitamin D , Ergocalciferol , (DRISDOL ) 1.25 MG (50000 UNIT) CAPS capsule Take 1 capsule (50,000 Units total) by mouth every 7 (seven) days. 5 capsule 0   CHLORTHALIDONE  PO  (Patient not taking: Reported on 02/15/2024)     DICLOFENAC POTASSIUM PO  (Patient not taking: Reported on 02/15/2024)     esomeprazole  (NEXIUM ) 40 MG capsule Take 1 capsule (40 mg total) by mouth daily. (Patient not taking: Reported on 02/15/2024) 30 capsule 2   fluocinonide ointment (LIDEX) 0.05 %  (Patient not taking: Reported on 02/15/2024)     LIDODERM  5 % SMARTSIG:1-3 Patch(s) Topical Daily     molnupiravir  EUA (LAGEVRIO ) 200 MG CAPS capsule Take 4 capsules by mouth every 12 hours for 5 days (Patient not taking: Reported on 02/15/2024) 40 capsule 0    Spacer/Aero-Holding Chambers DEVI 1 each by Does not apply route as needed. 1 each 0   No current facility-administered medications for this visit.   Allergies  Allergen Reactions   Latex Hives and Rash    Tape   (No problems with gloves)   Adhesive [Tape] Rash and Other (See Comments)    PT STATED ALLERGY TO TAPE, NOT LATEX GLOVES AS PREVIOUSLY DOCUMENTED (Pt prefers paper tape.)     Review of Systems: All systems reviewed and negative except where noted in HPI.   See labs as outlined above  Physical Exam: BP 124/64  Pulse 68   Ht 5\' 1"  (1.549 m)   LMP  (LMP Unknown)   BMI 31.93 kg/m  Constitutional: Pleasant,well-developed, female in no acute distress. Neurological: Alert and oriented to person place and time. Psychiatric: Normal mood and affect. Behavior is normal.   ASSESSMENT: 78 y.o. female here for assessment of the following  1. Iron deficiency anemia, unspecified iron deficiency anemia type   2. Gastroesophageal reflux disease, unspecified whether esophagitis present   3. Long-term current use of proton pump inhibitor therapy   4. Chronic constipation   5. Change in voice   6. Chronic cough    Iron deficiency could be in part due to AVMs and gastritis noted on EGD.  No concerning pathology on colonoscopy.  She is intolerant of oral iron due to constipation.  Fortunately she does not have any anemia from this but was requesting an IV iron infusion, potentially to help with her fatigue and ensure supplementated.  I think a dose of IV iron is reasonable and to repeat her labs again in 3 months.  She does not have any high risk or concerning pathology.  If she responds to IV iron I do not feel that she needs a capsule endoscopy to further evaluate.  We otherwise reviewed several issues today.  She has chronic constipation, poorly controlled.  We discussed options for treatment.  Will give her some Linzess samples at 72 mcg daily, she is worried about being too strong  on regular dosing.  Will see how she does with this.  If she finds low-dose Linzess too strong and MiraLAX , may opt to treat her with a fiber supplement although I am not sure if that will be strong enough.  We reviewed her reflux symptoms.  They seem well-controlled on Nexium  40 mg daily.  She has no Barrett's esophagus.  She does endorse some change in voice and chronic cough, I am not convinced this is related to reflux - she has had no improvement in higher dosing of PPI.  I think reasonable to refer to ENT for their evaluation, she is a former smoker, ensure no concerning pathology there and she is agreeable to that.  Discussed long-term risk benefits of chronic PPI use, long-term use lowest dose needed of Nexium  to control her symptoms.  Otherwise reviewed colonoscopy findings.  She will need another colonoscopy in 1 year for surveillance of colon polyps given burden of polyps on her last exam.  Incidentally noted to have some mild ischemic colitis which could have been related to prep or perhaps recently constipation around the time? She was asymptomatic from this.   If she has any concerning symptoms she will let me know, otherwise I do not feel that she has Crohn's disease or chronic process causing that based on review of pathology results.    PLAN: - refer her for IV iron at [facility]. Intolerant of oral iron - repeat CBC and iron studies in 3 months - continue nexium  40mg  once daily. Discussed risks / benefits of chronic PPI use - refer her to ENT at [facility] for evaluation of change in voice / chronic cough. Not convinced this is reflux related - trial of Linzess 72mcg / day -if she likes this can give script - fiber supplementation trial if linzess is too strong - repeat colonoscopy in one year from last exam for polyps - no symptoms of ischemic colitis, will contact me if she has any abdominal pain, rectal bleeding, etc  Christi Coward,  MD [facility]  CC: Wyn Heater, MD

## 2024-02-15 NOTE — Patient Instructions (Addendum)
 You will be due for labs in July.  Decrease your Nexium  to once a day.  We have given you samples of the following medication to take: Linzess 72 mcg: Take once daily 30 minutes before a meal  We are referring you to [facility] ENT.  They will contact you directly to schedule an appointment.  It may take a week or more before you hear from them.  Please feel free to contact us  if you have not heard from them within 2 weeks and we will follow up on the referral.   You will be due for a colonoscopy in 1 year from your last.  Thank you for entrusting me with your care and for choosing [facility], Dr. Alvester Johnson     If your blood pressure at your visit was 140/90 or greater, please contact your primary care physician to follow up on this. ______________________________________________________  If you are age 16 or older, your body mass index should be between 23-30. Your Body mass index is 31.93 kg/m. If this is out of the aforementioned range listed, please consider follow up with your Primary Care Provider.  If you are age 57 or younger, your body mass index should be between 19-25. Your Body mass index is 31.93 kg/m. If this is out of the aformentioned range listed, please consider follow up with your Primary Care Provider.  ________________________________________________________  The [facility] providers would like to encourage you to use MYCHART to communicate with providers for non-urgent requests or questions.  Due to long hold times on the telephone, sending your provider a message by [facility] may be a faster and more efficient way to get a response.  Please allow 48 business hours for a response.  Please remember that this is for non-urgent requests.  _______________________________________________________  Due to recent changes in healthcare laws, you may see the results of your imaging and laboratory studies on MyChart before your provider has had a chance to  review them.  We understand that in some cases there may be results that are confusing or concerning to you. Not all laboratory results come back in the same time frame and the provider may be waiting for multiple results in order to interpret others.  Please give us  48 hours in order for your provider to thoroughly review all the results before contacting the office for clarification of your results.

## 2024-02-15 NOTE — Telephone Encounter (Signed)
 EGD and colonoscopy report from 12-16-2023 and pathology faxed to Dr. Erlinda Haws office at [phone] as requested by patient

## 2024-02-16 ENCOUNTER — Other Ambulatory Visit: Payer: Self-pay | Admitting: *Deleted

## 2024-02-16 ENCOUNTER — Telehealth: Payer: Self-pay

## 2024-02-16 DIAGNOSIS — E041 Nontoxic single thyroid nodule: Secondary | ICD-10-CM | POA: Diagnosis not present

## 2024-02-16 DIAGNOSIS — D509 Iron deficiency anemia, unspecified: Secondary | ICD-10-CM

## 2024-02-16 DIAGNOSIS — E042 Nontoxic multinodular goiter: Secondary | ICD-10-CM | POA: Diagnosis not present

## 2024-02-16 NOTE — Telephone Encounter (Signed)
 Dr. General Kenner and Perla Bradford, patient will be scheduled as soon as possible.  Auth Submission: NO AUTH NEEDED Site of care: Site of care: [facility] Payer: [employer] medicare Medication & CPT/J Code(s) submitted: Venofer (Iron Sucrose) J1756 Route of submission (phone, fax, portal):  Phone # Fax # Auth type: Buy/Bill PB Units/visits requested: 200mg  x 5 doses Reference number:  Approval from: 02/16/24 to 07/18/24

## 2024-02-16 NOTE — Telephone Encounter (Signed)
 Great thank you!

## 2024-02-16 NOTE — Progress Notes (Signed)
===  View-only below this line=== ----- Message ----- From: Ace Holder, MD Sent: 02/15/2024   6:09 PM EDT To: [facility] Triage Subject: referral for IV                                Can someone please refer this patient to [facility] [address] [facility] for IV iron? Thanks

## 2024-02-16 NOTE — Progress Notes (Signed)
 Orders placed for IV Venofer to be given at [facility] [employer].

## 2024-02-22 ENCOUNTER — Ambulatory Visit

## 2024-02-22 MED ORDER — IRON SUCROSE 20 MG/ML IV SOLN: 200.0000 mg | Freq: Once | INTRAVENOUS | Status: DC

## 2024-02-22 MED ORDER — SODIUM CHLORIDE 0.9 % IV BOLUS
250.0000 mL | Freq: Once | INTRAVENOUS | Status: AC
Start: 2024-02-22 — End: ?
  Filled 2024-02-22: qty 250

## 2024-02-23 ENCOUNTER — Encounter (INDEPENDENT_AMBULATORY_CARE_PROVIDER_SITE_OTHER): Payer: Self-pay | Admitting: Otolaryngology

## 2024-02-24 ENCOUNTER — Ambulatory Visit

## 2024-02-25 ENCOUNTER — Ambulatory Visit (INDEPENDENT_AMBULATORY_CARE_PROVIDER_SITE_OTHER)

## 2024-02-25 VITALS — BP 135/68 | HR 101 | Temp 98.3°F | Resp 20 | Ht 61.0 in | Wt 176.6 lb

## 2024-02-25 DIAGNOSIS — D509 Iron deficiency anemia, unspecified: Secondary | ICD-10-CM | POA: Diagnosis not present

## 2024-02-25 MED ORDER — IRON SUCROSE 20 MG/ML IV SOLN
200.0000 mg | Freq: Once | INTRAVENOUS | Status: AC
Start: 2024-02-25 — End: 2024-02-25
  Administered 2024-02-25: 200 mg via INTRAVENOUS
  Filled 2024-02-25: qty 10

## 2024-02-25 NOTE — Patient Instructions (Signed)

## 2024-02-25 NOTE — Progress Notes (Signed)
 Diagnosis: Iron Deficiency Anemia  Provider:  Chilton Greathouse MD  Procedure: IV Push  IV Type: Peripheral, IV Location: L Antecubital  Venofer (Iron Sucrose), Dose: 200 mg  Post Infusion IV Care: Observation period completed and Peripheral IV Discontinued  Discharge: Condition: Good, Destination: Home . AVS Provided  Performed by:  Adriana Mccallum, RN

## 2024-02-29 ENCOUNTER — Ambulatory Visit

## 2024-02-29 VITALS — BP 144/72 | HR 80 | Temp 98.1°F | Resp 22 | Ht 61.0 in | Wt 166.6 lb

## 2024-02-29 DIAGNOSIS — D509 Iron deficiency anemia, unspecified: Secondary | ICD-10-CM | POA: Diagnosis not present

## 2024-02-29 MED ORDER — IRON SUCROSE 20 MG/ML IV SOLN
200.0000 mg | Freq: Once | INTRAVENOUS | Status: AC
  Administered 2024-02-29: 200 mg via INTRAVENOUS
  Filled 2024-02-29: qty 10

## 2024-02-29 NOTE — Progress Notes (Signed)
 Diagnosis: Acute Anemia  Provider:  Chilton Greathouse MD  Procedure: IV Push  IV Type: Peripheral, IV Location: L Antecubital  Venofer (Iron Sucrose), Dose: 200 mg  Post Infusion IV Care: Observation period completed and Peripheral IV Discontinued  Discharge: Condition: Good, Destination: Home . AVS Provided  Performed by:  Nat Math, RN

## 2024-03-02 ENCOUNTER — Ambulatory Visit

## 2024-03-02 MED ORDER — IRON SUCROSE 20 MG/ML IV SOLN: 200.0000 mg | Freq: Once | INTRAVENOUS | Status: DC

## 2024-03-03 ENCOUNTER — Encounter: Payer: Self-pay | Admitting: Gastroenterology

## 2024-03-07 DIAGNOSIS — M5416 Radiculopathy, lumbar region: Secondary | ICD-10-CM | POA: Diagnosis not present

## 2024-03-08 ENCOUNTER — Other Ambulatory Visit: Payer: Self-pay | Admitting: Physical Medicine and Rehabilitation

## 2024-03-08 ENCOUNTER — Ambulatory Visit (INDEPENDENT_AMBULATORY_CARE_PROVIDER_SITE_OTHER)

## 2024-03-08 VITALS — BP 161/73 | HR 80 | Temp 98.4°F | Resp 20 | Ht 61.0 in | Wt 172.8 lb

## 2024-03-08 DIAGNOSIS — M545 Low back pain, unspecified: Secondary | ICD-10-CM

## 2024-03-08 DIAGNOSIS — D509 Iron deficiency anemia, unspecified: Secondary | ICD-10-CM | POA: Diagnosis not present

## 2024-03-08 DIAGNOSIS — M5410 Radiculopathy, site unspecified: Secondary | ICD-10-CM

## 2024-03-08 MED ORDER — IRON SUCROSE 20 MG/ML IV SOLN
200.0000 mg | Freq: Once | INTRAVENOUS | Status: AC
  Administered 2024-03-08: 200 mg via INTRAVENOUS
  Filled 2024-03-08: qty 10

## 2024-03-08 NOTE — Progress Notes (Signed)
 Diagnosis: Iron  Deficiency Anemia  Provider:  Mannam, Praveen MD  Procedure: IV Push  IV Type: Peripheral, IV Location: L Antecubital  Venofer  (Iron  Sucrose), Dose: 200 mg  Post Infusion IV Care: Observation period completed and Peripheral IV Discontinued. 15 minute observation per patient request.  Discharge: Condition: Good, Destination: Home . AVS Provided  Performed by:  Lendel Quant, RN

## 2024-03-09 DIAGNOSIS — K080 Exfoliation of teeth due to systemic causes: Secondary | ICD-10-CM | POA: Diagnosis not present

## 2024-03-16 ENCOUNTER — Ambulatory Visit (INDEPENDENT_AMBULATORY_CARE_PROVIDER_SITE_OTHER)

## 2024-03-16 VITALS — BP 138/70 | HR 85 | Temp 98.0°F | Resp 18 | Ht 61.0 in | Wt 170.8 lb

## 2024-03-16 DIAGNOSIS — D509 Iron deficiency anemia, unspecified: Secondary | ICD-10-CM

## 2024-03-16 MED ORDER — IRON SUCROSE 20 MG/ML IV SOLN
200.0000 mg | Freq: Once | INTRAVENOUS | Status: AC
  Administered 2024-03-16: 200 mg via INTRAVENOUS
  Filled 2024-03-16: qty 10

## 2024-03-16 MED ORDER — SODIUM CHLORIDE 0.9 % IV BOLUS
250.0000 mL | Freq: Once | INTRAVENOUS | Status: DC
  Filled 2024-03-16: qty 250

## 2024-03-16 NOTE — Progress Notes (Signed)
 Diagnosis: Iron Deficiency Anemia  Provider:  Chilton Greathouse MD  Procedure: IV Push  IV Type: Peripheral, IV Location: L Antecubital  Venofer (Iron Sucrose), Dose: 200 mg  Post Infusion IV Care: Observation period completed and Peripheral IV Discontinued  Discharge: Condition: Good, Destination: Home . AVS Declined  Performed by:  Forrest Moron, RN

## 2024-03-20 DIAGNOSIS — G8929 Other chronic pain: Secondary | ICD-10-CM | POA: Diagnosis not present

## 2024-03-20 DIAGNOSIS — E782 Mixed hyperlipidemia: Secondary | ICD-10-CM | POA: Diagnosis not present

## 2024-03-20 DIAGNOSIS — E113392 Type 2 diabetes mellitus with moderate nonproliferative diabetic retinopathy without macular edema, left eye: Secondary | ICD-10-CM | POA: Diagnosis not present

## 2024-03-20 DIAGNOSIS — I10 Essential (primary) hypertension: Secondary | ICD-10-CM | POA: Diagnosis not present

## 2024-03-21 ENCOUNTER — Ambulatory Visit

## 2024-03-21 VITALS — BP 131/67 | HR 84 | Temp 98.4°F | Resp 20 | Ht 61.0 in | Wt 171.8 lb

## 2024-03-21 DIAGNOSIS — D509 Iron deficiency anemia, unspecified: Secondary | ICD-10-CM

## 2024-03-21 MED ORDER — IRON SUCROSE 20 MG/ML IV SOLN
200.0000 mg | Freq: Once | INTRAVENOUS | Status: AC
  Administered 2024-03-21: 200 mg via INTRAVENOUS
  Filled 2024-03-21: qty 10

## 2024-03-21 NOTE — Progress Notes (Signed)
 Diagnosis: Iron Deficiency Anemia  Provider:  Chilton Greathouse MD  Procedure: IV Push  IV Type: Peripheral, IV Location: L Antecubital  Venofer (Iron Sucrose), Dose: 200 mg  Post Infusion IV Care: Observation period completed and Peripheral IV Discontinued  Discharge: Condition: Good, Destination: Home . AVS Declined  Performed by:  Rico Ala, LPN

## 2024-03-27 NOTE — Discharge Instructions (Signed)

## 2024-03-28 ENCOUNTER — Ambulatory Visit
Admission: RE | Admit: 2024-03-28 | Discharge: 2024-03-28 | Disposition: A | Discharge status: Home / Self Care | Source: Ambulatory Visit | Attending: Physical Medicine and Rehabilitation | Admitting: Physical Medicine and Rehabilitation

## 2024-03-28 DIAGNOSIS — M48061 Spinal stenosis, lumbar region without neurogenic claudication: Secondary | ICD-10-CM | POA: Diagnosis not present

## 2024-03-28 DIAGNOSIS — M5116 Intervertebral disc disorders with radiculopathy, lumbar region: Secondary | ICD-10-CM | POA: Diagnosis not present

## 2024-03-28 DIAGNOSIS — M545 Low back pain, unspecified: Secondary | ICD-10-CM

## 2024-03-28 DIAGNOSIS — M5410 Radiculopathy, site unspecified: Secondary | ICD-10-CM

## 2024-03-28 DIAGNOSIS — M4807 Spinal stenosis, lumbosacral region: Secondary | ICD-10-CM | POA: Diagnosis not present

## 2024-03-28 MED ORDER — MEPERIDINE HCL 50 MG/ML IJ SOLN
50.0000 mg | Freq: Once | INTRAMUSCULAR | Status: AC | PRN
  Administered 2024-03-28: 50 mg via INTRAMUSCULAR

## 2024-03-28 MED ORDER — DIAZEPAM 5 MG PO TABS
5.0000 mg | ORAL_TABLET | Freq: Once | ORAL | Status: DC
Start: 2024-03-28 — End: 2024-03-29

## 2024-03-28 MED ORDER — IOPAMIDOL (ISOVUE-M 200) INJECTION 41%
18.0000 mL | Freq: Once | INTRAMUSCULAR | Status: AC
  Administered 2024-03-28: 18 mL via INTRATHECAL

## 2024-03-28 MED ORDER — ONDANSETRON HCL 4 MG/2ML IJ SOLN: 4.0000 mg | Freq: Once | INTRAMUSCULAR | Status: DC | PRN

## 2024-03-28 NOTE — Discharge Instr - Other Info (Signed)
 Pt c/o 9/10 pain in her lumbar back during myelogram procedure. Pain meds given-See MAR

## 2024-04-06 DIAGNOSIS — E1121 Type 2 diabetes mellitus with diabetic nephropathy: Secondary | ICD-10-CM | POA: Diagnosis not present

## 2024-04-24 DIAGNOSIS — I10 Essential (primary) hypertension: Secondary | ICD-10-CM | POA: Diagnosis not present

## 2024-04-24 DIAGNOSIS — E113392 Type 2 diabetes mellitus with moderate nonproliferative diabetic retinopathy without macular edema, left eye: Secondary | ICD-10-CM | POA: Diagnosis not present

## 2024-04-25 ENCOUNTER — Telehealth: Payer: Self-pay

## 2024-04-25 DIAGNOSIS — D509 Iron deficiency anemia, unspecified: Secondary | ICD-10-CM

## 2024-04-25 DIAGNOSIS — Z13220 Encounter for screening for lipoid disorders: Secondary | ICD-10-CM

## 2024-04-25 NOTE — Telephone Encounter (Signed)
 Orders entered

## 2024-04-25 NOTE — Telephone Encounter (Signed)
-----   Message from [facility] Clarita H sent at 02/15/2024  4:51 PM EDT ----- Regarding: due for labs in July Due for CBC and IBC/Ferritin in July

## 2024-04-25 NOTE — Telephone Encounter (Signed)
 Called and left message for patient to go to the lab

## 2024-04-27 DIAGNOSIS — R748 Abnormal levels of other serum enzymes: Secondary | ICD-10-CM | POA: Diagnosis not present

## 2024-04-27 DIAGNOSIS — I10 Essential (primary) hypertension: Secondary | ICD-10-CM | POA: Diagnosis not present

## 2024-04-27 DIAGNOSIS — E1121 Type 2 diabetes mellitus with diabetic nephropathy: Secondary | ICD-10-CM | POA: Diagnosis not present

## 2024-04-27 NOTE — Telephone Encounter (Signed)
 Spoke with patient and advised her of lab orders. She was told by her pcp to have us  also place an order to check her cholesterol as well. Requesting to speak with a nurse.

## 2024-04-28 NOTE — Addendum Note (Signed)
 Addended by: Kameran Lallier M on: 04/28/2024 08:38 AM   Modules accepted: Orders

## 2024-04-28 NOTE — Telephone Encounter (Signed)
 Per Dr. Leigh OK to add Lipid panel. Called patient.  She is aware

## 2024-05-02 DIAGNOSIS — K080 Exfoliation of teeth due to systemic causes: Secondary | ICD-10-CM | POA: Diagnosis not present

## 2024-05-03 DIAGNOSIS — K8020 Calculus of gallbladder without cholecystitis without obstruction: Secondary | ICD-10-CM | POA: Diagnosis not present

## 2024-05-03 DIAGNOSIS — R748 Abnormal levels of other serum enzymes: Secondary | ICD-10-CM | POA: Diagnosis not present

## 2024-05-04 DIAGNOSIS — L821 Other seborrheic keratosis: Secondary | ICD-10-CM | POA: Diagnosis not present

## 2024-05-04 DIAGNOSIS — L578 Other skin changes due to chronic exposure to nonionizing radiation: Secondary | ICD-10-CM | POA: Diagnosis not present

## 2024-05-04 DIAGNOSIS — D2272 Melanocytic nevi of left lower limb, including hip: Secondary | ICD-10-CM | POA: Diagnosis not present

## 2024-05-04 DIAGNOSIS — D225 Melanocytic nevi of trunk: Secondary | ICD-10-CM | POA: Diagnosis not present

## 2024-05-04 DIAGNOSIS — L570 Actinic keratosis: Secondary | ICD-10-CM | POA: Diagnosis not present

## 2024-05-09 ENCOUNTER — Ambulatory Visit: Payer: Self-pay | Admitting: Gastroenterology

## 2024-05-09 ENCOUNTER — Other Ambulatory Visit (INDEPENDENT_AMBULATORY_CARE_PROVIDER_SITE_OTHER)

## 2024-05-09 ENCOUNTER — Ambulatory Visit (INDEPENDENT_AMBULATORY_CARE_PROVIDER_SITE_OTHER): Admitting: Otolaryngology

## 2024-05-09 VITALS — BP 148/73 | HR 88

## 2024-05-09 DIAGNOSIS — Z9621 Cochlear implant status: Secondary | ICD-10-CM

## 2024-05-09 DIAGNOSIS — K219 Gastro-esophageal reflux disease without esophagitis: Secondary | ICD-10-CM

## 2024-05-09 DIAGNOSIS — J383 Other diseases of vocal cords: Secondary | ICD-10-CM

## 2024-05-09 DIAGNOSIS — D509 Iron deficiency anemia, unspecified: Secondary | ICD-10-CM | POA: Diagnosis not present

## 2024-05-09 DIAGNOSIS — R490 Dysphonia: Secondary | ICD-10-CM

## 2024-05-09 DIAGNOSIS — Z13220 Encounter for screening for lipoid disorders: Secondary | ICD-10-CM | POA: Diagnosis not present

## 2024-05-09 LAB — CBC WITH DIFFERENTIAL/PLATELET
Basophils Absolute: 0 K/uL (ref 0.0–0.1)
Basophils Relative: 0.7 % (ref 0.0–3.0)
Eosinophils Absolute: 0.1 K/uL (ref 0.0–0.7)
Eosinophils Relative: 2.2 % (ref 0.0–5.0)
HCT: 42.7 % (ref 36.0–46.0)
Hemoglobin: 14.3 g/dL (ref 12.0–15.0)
Lymphocytes Relative: 23 % (ref 12.0–46.0)
Lymphs Abs: 1.2 K/uL (ref 0.7–4.0)
MCHC: 33.4 g/dL (ref 30.0–36.0)
MCV: 95 fl (ref 78.0–100.0)
Monocytes Absolute: 0.4 K/uL (ref 0.1–1.0)
Monocytes Relative: 7 % (ref 3.0–12.0)
Neutro Abs: 3.6 K/uL (ref 1.4–7.7)
Neutrophils Relative %: 67.1 % (ref 43.0–77.0)
Platelets: 164 K/uL (ref 150.0–400.0)
RBC: 4.49 Mil/uL (ref 3.87–5.11)
RDW: 18 % — ABNORMAL HIGH (ref 11.5–15.5)
WBC: 5.3 K/uL (ref 4.0–10.5)

## 2024-05-09 LAB — LIPID PANEL
Cholesterol: 135 mg/dL (ref 0–200)
HDL: 55 mg/dL (ref >39.00)
LDL Cholesterol: 56 mg/dL (ref 0–99)
NonHDL: 80.21
Total CHOL/HDL Ratio: 2
Triglycerides: 120 mg/dL (ref 0.0–149.0)
VLDL: 24 mg/dL (ref 0.0–40.0)

## 2024-05-09 LAB — IBC + FERRITIN
Ferritin: 223.3 ng/mL (ref 10.0–291.0)
Iron: 90 ug/dL (ref 42–145)
Saturation Ratios: 28.3 % (ref 20.0–50.0)
TIBC: 317.8 ug/dL (ref 250.0–450.0)
Transferrin: 227 mg/dL (ref 212.0–360.0)

## 2024-05-09 NOTE — Progress Notes (Signed)
 ENT CONSULT:  Reason for Consult: dysphonia   HPI: Discussed the use of AI scribe software for clinical note transcription with the patient, who gave verbal consent to proceed.  History of Present Illness Elizabeth Stone is a 78 year old female with hx of stroke 2023, hx of GERD on Nexium  and established with GI, who presents with voice changes, deeper pitch of voice and hoarseness for several years.   She has experienced voice changes over the last five years, characterized by a deeper and hoarse voice, particularly worsening by the end of the day. These changes make it difficult for others to understand her. Notably, these symptoms were present before her stroke.  She had a stroke at the age of 64, which initially affected her speech. She underwent speech therapy at that time, which led to initial improvement in her speech.  She experiences shortness of breath, which can be triggered by various activities, including conversation. She uses a walker for mobility and has a history of a spinal fracture.  She has a cochlear implant in her right ear, which she manages herself without regular follow-up with a doctor. No trouble with swallowing.     Records Reviewed:  Office visit with GI Dr Leigh 02/15/24  78 year old female here for follow-up visit for iron  deficiency as well as other symptoms she would like to discuss today.  Recall she is hearing impaired status post cochlear implant, Mnire's disease, diabetes, history of stroke.   She had an EGD and colonoscopy with me for IDA in February.  She had some mild gastritis noted, along with some AVMs in her upper tract.  Biopsies for H. pylori negative.  Colonoscopy showed 13 polyps, the majority were adenomas, fortunately all small.  She also incidentally was noted to have some mild ischemic colitis as outlined in the procedure report below.  She denies any pain around the time of the procedure problems with diarrhea at baseline or blood in  her stool.   She reminds me she has had problems with chronic constipation for years.  She typically has 2 bowel movements per week, very hard stools.  She has not been taking any iron  supplementation due to her constipation and fears of worsening.  She has used some senna as needed but does not work well enough for her.  She states she does not like MiraLAX  as it created too loose of stool and she did not like that either.  She has not tried a fiber supplement.  She inquires about Linzess  which she has heard about and wants to try that.   She does take oxycodone  periodically for joint pains, usually once or twice per week at most.  She denies any NSAID use other than aspirin .  She takes Tylenol  only as needed and that typically works pretty well for her.   She denies any family history of colon cancer.  She did smoke tobacco in the past, quit in 1992.   We had increased her Nexium  from 20 mg daily to 40 mg daily.  She states that typically controls her reflux pretty well and has not had much breakthrough.  She has occasional nausea but no vomiting.  She does complain of a raspy voice and voice changes recently, as well as chronic cough.  She has never seen ENT.   Of note we did blood work while she has not been on any iron  supplementation and her hemoglobin is normal, very mild iron  deficiency on labs  Past Medical History:  Diagnosis Date   Acute blood loss anemia    hx   Anxiety    Arthritis    Balance problem    Bipolar disorder (HCC)    Cochlear implant in place    Cochlear implant in place    right ear   Depression    Dermatitis    on face   Dyspnea    Fibromyalgia    GERD (gastroesophageal reflux disease)    Hard of hearing    Hearing aid worn    left ear   Hearing impaired    has cochlear implant   HTN (hypertension)    Hypokalemia    Meniere disease    takes HCTZ for her inner ear problem   Mixed hyperlipidemia 09/27/2013   Osteoporosis    Pneumonia 11/2016    Pre-diabetes    Primary localized osteoarthritis of left knee 11/16/2018   Primary localized osteoarthritis of right knee    Slow transit constipation    Unsteady gait     Past Surgical History:  Procedure Laterality Date   ABDOMINAL HYSTERECTOMY  1986   bladder tack  2007   COCHLEAR IMPLANT     2007   COLONOSCOPY     COLONOSCOPY     HAMMER TOE SURGERY  2014   left foot   REVERSE SHOULDER ARTHROPLASTY Right 12/13/2014   dr dozier   REVERSE SHOULDER ARTHROPLASTY Right 12/13/2014   Procedure: REVERSE SHOULDER ARTHROPLASTY rt;  Surgeon: Eva Elsie dozier, MD;  Location[facility] OR;  Service: Orthopedics;  Laterality: Right;  Right reverse total shoulder replacement   REVERSE SHOULDER ARTHROPLASTY Left 01/07/2017   Procedure: REVERSE LEFT TOTAL SHOULDER ARTHROPLASTY;  Surgeon: Eva dozier, MD;  Location: [facility] OR;  Service: Orthopedics;  Laterality: Left;  Left reveres total shoulder arthroplasty   TOTAL KNEE ARTHROPLASTY Right 12/16/2015   Procedure: TOTAL KNEE ARTHROPLASTY;  Surgeon: Lamar Millman, MD;  Location: [facility] OR;  Service: Orthopedics;  Laterality: Right;   TOTAL KNEE ARTHROPLASTY Left 11/28/2018   Procedure: LEFT TOTAL KNEE ARTHROPLASTY;  Surgeon: Millman Lamar, MD;  Location: [facility] OR;  Service: Orthopedics;  Laterality: Left;    Family History  Problem Relation Age of Onset   Heart disease Mother    Breast cancer Mother    Cervical cancer Mother    Diabetes Mother    Heart disease Father    Diabetes Maternal Aunt    Colon cancer Neg Hx    Esophageal cancer Neg Hx    Rectal cancer Neg Hx    Stomach cancer Neg Hx    Liver cancer Neg Hx    Pancreatic cancer Neg Hx     Social History:  reports that she quit smoking about 30 years ago. Her smoking use included cigarettes. She started smoking about 53 years ago. She has a 5.8 pack-year smoking history. She has never used smokeless tobacco. She reports current alcohol use. She reports that she does not use  drugs.  Allergies:  Allergies  Allergen Reactions   Latex Hives and Rash    Tape   (No problems with gloves)   Adhesive [Tape] Rash and Other (See Comments)    PT STATED ALLERGY TO TAPE, NOT LATEX GLOVES AS PREVIOUSLY DOCUMENTED (Pt prefers paper tape.)    Medications: I have reviewed the patient's current medications.  The PMH, PSH, Medications, Allergies, and SH were reviewed and updated.  ROS: Constitutional: Negative for fever, weight loss and weight gain. Cardiovascular: Negative for chest pain and  dyspnea on exertion. Respiratory: Is not experiencing shortness of breath at rest. Gastrointestinal: Negative for nausea and vomiting. Neurological: Negative for headaches. Psychiatric: The patient is not nervous/anxious  Blood pressure (!) 148/73, pulse 88, SpO2 91%. There is no height or weight on file to calculate BMI.  PHYSICAL EXAM:  Exam: General: Well-developed, well-nourished Communication and Voice: raspy Respiratory Respiratory effort: Equal inspiration and expiration without stridor Cardiovascular Peripheral Vascular: Warm extremities with equal color/perfusion Eyes: No nystagmus with equal extraocular motion bilaterally Neuro/Psych/Balance: Patient oriented to person, place, and time; Appropriate mood and affect; Gait is intact with no imbalance; Cranial nerves I-XII are intact Head and Face Inspection: Normocephalic and atraumatic without mass or lesion Palpation: Facial skeleton intact without bony stepoffs Salivary Glands: No mass or tenderness Facial Strength: Facial motility symmetric and full bilaterally ENT Pinna: External ear intact and fully developed External canal: Canal is patent with intact skin Tympanic Membrane: Clear and mobile External Nose: No scar or anatomic deformity Internal Nose: Septum is deviated to the left. No polyp, or purulence. Mucosal edema and erythema present.  Bilateral inferior turbinate hypertrophy.  Lips, Teeth, and gums:  Mucosa and teeth intact and viable TMJ: No pain to palpation with full mobility Oral cavity/oropharynx: No erythema or exudate, no lesions present Nasopharynx: No mass or lesion with intact mucosa Hypopharynx: Intact mucosa without pooling of secretions Larynx Glottic: Full true vocal cord mobility without lesion or mass Supraglottic: Normal appearing epiglottis and AE folds Interarytenoid Space: Moderate pachydermia&edema Subglottic Space: Patent without lesion or edema Neck Neck and Trachea: Midline trachea without mass or lesion Thyroid : No mass or nodularity Lymphatics: No lymphadenopathy  Procedure:  Preoperative diagnosis: hoarseness  Postoperative diagnosis:   same + GERD LPR and VF atrophy glottic insufficiency  Procedure: Flexible fiberoptic laryngoscopy with stroboscopy (68420)   Surgeon: Elena Larry, MD  Anesthesia: Topical lidocaine  and Afrin  Complications: None  Condition is stable throughout exam  Indications and consent:   The patient presents to the clinic with hoarseness. All the risks, benefits, and potential complications were reviewed with the patient preoperatively and informed verbal consent was obtained.  Procedure: The patient was seated upright in the exam chair.   Topical lidocaine  and Afrin were applied to the nasal cavity. After adequate anesthesia had occurred, the flexible telescope with strobe capabilities was passed into the nasal cavity. The nasopharynx was patent without mass or lesion. The scope was passed behind the soft palate and directed toward the base of tongue. The base of tongue was visualized and was symmetric with no apparent masses or abnormal appearing tissue. There were no signs of a mass or pooling of secretions in the piriform sinuses. The supraglottic structures were normal.  The true vocal cords are mobile. The medial edges were bowed. Closure was incomplete with spindle shaped gap and significant supraglottic  compression/some tremor likely caused by compression. Periodicity present. The mucosal wave and amplitude were normal and intact. There is moderate interarytenoid pachydermia and post cricoid edema. The mucosa appears without lesions.   The laryngoscope was then slowly withdrawn and the patient tolerated the procedure well. There were no complications or blood loss.  Studies Reviewed CXR 09/02/23 FINDINGS: Stable cardiomediastinal silhouette. Aortic atherosclerotic calcification. Low lung volumes accentuate pulmonary vascularity. Reticulonodular opacities in the lower lungs. No pleural effusion or pneumothorax. Bilateral reverse TSA.   IMPRESSION: Reticulonodular opacities in the lower lungs suggestive of bronchitis/reactive airways.  Assessment/Plan: Encounter Diagnoses  Name Primary?   Glottic insufficiency Yes   Dysphonia  Hoarseness    Age-related vocal fold atrophy    Chronic GERD    History of cochlear implant     Assessment and Plan Assessment & Plan Chronic dysphonia  Chronic hoarseness and voice sounding deeper for several years. Strobe exam without masses or lesions, but evidence of vocal fold atrophy glottic insufficiency with significant supraglottic compression causing tremor and strain.  - Refer to voice therapy to reduce strain and improve breath support.   Shortness of breath Reported shortness of breath with exertion or during conversations. - see PCP for workup and management   Stroke Stroke at age 18 with prior speech therapy. Current voice issues predate stroke. - see neurology as needed    Cochlear implant R side for SNHL Cochlear implant in right ear, self-managed without regular specialist follow-up. - offered referral to Neurotology, but she would like to hold off    Thank you for allowing me to participate in the care of this patient. Please do not hesitate to contact me with any questions or concerns.   Elena Larry,  MD Otolaryngology [facility] Phone: [phone] Fax: [phone]    05/09/2024, 2:43 PM

## 2024-05-10 DIAGNOSIS — M48061 Spinal stenosis, lumbar region without neurogenic claudication: Secondary | ICD-10-CM | POA: Diagnosis not present

## 2024-05-11 DIAGNOSIS — Z1231 Encounter for screening mammogram for malignant neoplasm of breast: Secondary | ICD-10-CM | POA: Diagnosis not present

## 2024-05-18 DIAGNOSIS — F319 Bipolar disorder, unspecified: Secondary | ICD-10-CM | POA: Diagnosis not present

## 2024-05-18 DIAGNOSIS — M810 Age-related osteoporosis without current pathological fracture: Secondary | ICD-10-CM | POA: Diagnosis not present

## 2024-05-18 DIAGNOSIS — I10 Essential (primary) hypertension: Secondary | ICD-10-CM | POA: Diagnosis not present

## 2024-05-18 DIAGNOSIS — E209 Hypoparathyroidism, unspecified: Secondary | ICD-10-CM | POA: Diagnosis not present

## 2024-05-18 DIAGNOSIS — E113392 Type 2 diabetes mellitus with moderate nonproliferative diabetic retinopathy without macular edema, left eye: Secondary | ICD-10-CM | POA: Diagnosis not present

## 2024-05-22 DIAGNOSIS — E1121 Type 2 diabetes mellitus with diabetic nephropathy: Secondary | ICD-10-CM | POA: Diagnosis not present

## 2024-05-22 DIAGNOSIS — M810 Age-related osteoporosis without current pathological fracture: Secondary | ICD-10-CM | POA: Diagnosis not present

## 2024-05-22 DIAGNOSIS — I10 Essential (primary) hypertension: Secondary | ICD-10-CM | POA: Diagnosis not present

## 2024-05-23 DIAGNOSIS — E113392 Type 2 diabetes mellitus with moderate nonproliferative diabetic retinopathy without macular edema, left eye: Secondary | ICD-10-CM | POA: Diagnosis not present

## 2024-05-23 DIAGNOSIS — I10 Essential (primary) hypertension: Secondary | ICD-10-CM | POA: Diagnosis not present

## 2024-05-24 DIAGNOSIS — F3181 Bipolar II disorder: Secondary | ICD-10-CM | POA: Diagnosis not present

## 2024-05-25 NOTE — Telephone Encounter (Signed)
 Patient requesting f/u call in regards to results. Please advise.   Thank you

## 2024-06-07 DIAGNOSIS — R109 Unspecified abdominal pain: Secondary | ICD-10-CM | POA: Diagnosis not present

## 2024-06-12 DIAGNOSIS — M48061 Spinal stenosis, lumbar region without neurogenic claudication: Secondary | ICD-10-CM | POA: Diagnosis not present

## 2024-06-13 ENCOUNTER — Ambulatory Visit

## 2024-06-21 DIAGNOSIS — Z1331 Encounter for screening for depression: Secondary | ICD-10-CM | POA: Diagnosis not present

## 2024-06-21 DIAGNOSIS — I10 Essential (primary) hypertension: Secondary | ICD-10-CM | POA: Diagnosis not present

## 2024-06-21 DIAGNOSIS — Z6832 Body mass index (BMI) 32.0-32.9, adult: Secondary | ICD-10-CM | POA: Diagnosis not present

## 2024-06-21 DIAGNOSIS — K219 Gastro-esophageal reflux disease without esophagitis: Secondary | ICD-10-CM | POA: Diagnosis not present

## 2024-06-21 DIAGNOSIS — E782 Mixed hyperlipidemia: Secondary | ICD-10-CM | POA: Diagnosis not present

## 2024-06-21 DIAGNOSIS — Z0000 Encounter for general adult medical examination without abnormal findings: Secondary | ICD-10-CM | POA: Diagnosis not present

## 2024-06-21 DIAGNOSIS — G8929 Other chronic pain: Secondary | ICD-10-CM | POA: Diagnosis not present

## 2024-06-21 DIAGNOSIS — E113392 Type 2 diabetes mellitus with moderate nonproliferative diabetic retinopathy without macular edema, left eye: Secondary | ICD-10-CM | POA: Diagnosis not present

## 2024-06-22 ENCOUNTER — Other Ambulatory Visit (HOSPITAL_COMMUNITY): Payer: Self-pay

## 2024-07-02 ENCOUNTER — Other Ambulatory Visit: Payer: Self-pay

## 2024-07-02 ENCOUNTER — Emergency Department (HOSPITAL_COMMUNITY)
Admission: EM | Admit: 2024-07-02 | Discharge: 2024-07-02 | Disposition: A | Discharge status: Home / Self Care | Attending: Emergency Medicine | Admitting: Emergency Medicine

## 2024-07-02 ENCOUNTER — Encounter (HOSPITAL_COMMUNITY): Payer: Self-pay

## 2024-07-02 ENCOUNTER — Emergency Department (HOSPITAL_COMMUNITY)

## 2024-07-02 DIAGNOSIS — R0789 Other chest pain: Secondary | ICD-10-CM | POA: Diagnosis not present

## 2024-07-02 DIAGNOSIS — R0602 Shortness of breath: Secondary | ICD-10-CM | POA: Insufficient documentation

## 2024-07-02 DIAGNOSIS — R140 Abdominal distension (gaseous): Secondary | ICD-10-CM | POA: Diagnosis not present

## 2024-07-02 DIAGNOSIS — Z7982 Long term (current) use of aspirin: Secondary | ICD-10-CM | POA: Insufficient documentation

## 2024-07-02 DIAGNOSIS — Z91040 Latex allergy status: Secondary | ICD-10-CM | POA: Diagnosis not present

## 2024-07-02 DIAGNOSIS — E871 Hypo-osmolality and hyponatremia: Secondary | ICD-10-CM | POA: Insufficient documentation

## 2024-07-02 DIAGNOSIS — R002 Palpitations: Secondary | ICD-10-CM | POA: Insufficient documentation

## 2024-07-02 LAB — CBC WITH DIFFERENTIAL/PLATELET
Abs Immature Granulocytes: 0.08 K/uL — ABNORMAL HIGH (ref 0.00–0.07)
Basophils Absolute: 0 K/uL (ref 0.0–0.1)
Basophils Relative: 1 %
Eosinophils Absolute: 0.1 K/uL (ref 0.0–0.5)
Eosinophils Relative: 1 %
HCT: 38.9 % (ref 36.0–46.0)
Hemoglobin: 13 g/dL (ref 12.0–15.0)
Immature Granulocytes: 1 %
Lymphocytes Relative: 28 %
Lymphs Abs: 2 K/uL (ref 0.7–4.0)
MCH: 34.2 pg — ABNORMAL HIGH (ref 26.0–34.0)
MCHC: 33.4 g/dL (ref 30.0–36.0)
MCV: 102.4 fL — ABNORMAL HIGH (ref 80.0–100.0)
Monocytes Absolute: 0.6 K/uL (ref 0.1–1.0)
Monocytes Relative: 9 %
Neutro Abs: 4.3 K/uL (ref 1.7–7.7)
Neutrophils Relative %: 60 %
Platelets: 151 K/uL (ref 150–400)
RBC: 3.8 MIL/uL — ABNORMAL LOW (ref 3.87–5.11)
RDW: 14.5 % (ref 11.5–15.5)
WBC: 7.1 K/uL (ref 4.0–10.5)
nRBC: 0 % (ref 0.0–0.2)

## 2024-07-02 LAB — MAGNESIUM: Magnesium: 1.8 mg/dL (ref 1.7–2.4)

## 2024-07-02 LAB — BASIC METABOLIC PANEL WITH GFR
Anion gap: 9 (ref 5–15)
BUN: 8 mg/dL (ref 8–23)
CO2: 24 mmol/L (ref 22–32)
Calcium: 8.8 mg/dL — ABNORMAL LOW (ref 8.9–10.3)
Chloride: 97 mmol/L — ABNORMAL LOW (ref 98–111)
Creatinine, Ser: 0.65 mg/dL (ref 0.44–1.00)
GFR, Estimated: >60 mL/min (ref >60)
Glucose, Bld: 136 mg/dL — ABNORMAL HIGH (ref 70–99)
Potassium: 3.9 mmol/L (ref 3.5–5.1)
Sodium: 130 mmol/L — ABNORMAL LOW (ref 135–145)

## 2024-07-02 LAB — TROPONIN I (HIGH SENSITIVITY)
Troponin I (High Sensitivity): 6 ng/L (ref <18)
Troponin I (High Sensitivity): 5 ng/L (ref <18)

## 2024-07-02 NOTE — Discharge Instructions (Signed)
 You have been seen and discharged from the emergency department.  Your heart rhythm and blood work was normal.  Continue to follow-up with cardiology as an outpatient.  Follow-up with your primary provider for further evaluation and further care. Take home medications as prescribed. If you have any worsening symptoms or further concerns for your health please return to an emergency department for further evaluation.

## 2024-07-02 NOTE — ED Triage Notes (Signed)
 PT BIB [facility] from home. PT was getting ready for church and  had a sudden onset of chest tightness and was SOB. Called ems and took 324 Asprin upon EMS arrival symptoms resolved. PT reported had this same episode last Nov and was in vtach.  NSR HR 76 BP 140/54 CBG 159 97% RA 20g left ac

## 2024-07-02 NOTE — ED Provider Notes (Signed)
 [place] EMERGENCY DEPARTMENT AT [facility] Provider Note   CSN: [id number] Arrival date & time: 07/02/24  9041     Patient presents with: No chief complaint on file.   Elizabeth Stone is a 78 y.o. female.   HPI   78 year old female presents emergency department after an episode of chest tightness, palpitations and shortness of breath.  Patient has had episodes like this before, specifically earlier this year and last November when she was diagnosed with SVT.  Placed on a beta-blocker and followed with cardiology.  She states that this episode felt similar to previous.  But she was not near her blood pressure machine to check to see if her heart rate was still racing so she came in for evaluation.  She is otherwise been in her usual state of health.  Her symptoms have resolved.  No lower leg swelling.  She is otherwise been compliant with her medications.  Prior to Admission medications   Medication Sig Start Date End Date Taking? Authorizing Provider  acetaminophen  (TYLENOL ) 325 MG tablet Take 1 tablet (325 mg total) by mouth every 4 (four) hours as needed for mild pain (or temp > 37.5 C (99.5 F)). 10/20/22   Angiulli, Toribio PARAS, PA-C  albuterol  (VENTOLIN  HFA) 108 (90 Base) MCG/ACT inhaler Inhale 1-2 puffs into the lungs every 6 (six) hours as needed. 11/19/22   Parrett, Madelin RAMAN, NP  alendronate (FOSAMAX) 70 MG tablet Take 70 mg by mouth once a week.    [provider]  Ascorbic Acid  (VITAMIN C  ER PO) Vitamin C     [provider]  Asenapine  Maleate 10 MG SUBL Place 1 tablet under the tongue at bedtime. 09/06/22   [provider]  aspirin  EC 81 MG tablet Take 1 tablet (81 mg total) by mouth daily. Swallow whole. 10/12/22   de Clint Kill, Cortney E, NP  atorvastatin  (LIPITOR) 20 MG tablet Take 1 tablet (20 mg total) by mouth at bedtime. 10/20/22   Angiulli, Daniel J, PA-C  B Complex-C (B-COMPLEX WITH VITAMIN C ) tablet Take 1 tablet by mouth daily. 10/20/22    Angiulli, Toribio PARAS, PA-C  buPROPion  (WELLBUTRIN  XL) 300 MG 24 hr tablet Take 1 tablet (300 mg total) by mouth daily. 10/20/22   Angiulli, Toribio PARAS, PA-C  cetirizine (ZYRTEC) 10 MG chewable tablet Chew 10 mg by mouth at bedtime.    [provider]  CHLORTHALIDONE  PO     [provider]  clonazePAM  (KLONOPIN ) 1 MG tablet Take 1 tablet (1 mg total) by mouth at bedtime. 10/20/22   Angiulli, Toribio PARAS, PA-C  Dermatological Products, Misc. [facility] ROSACEA EX)  08/21/20   [provider]  DICLOFENAC POTASSIUM PO     [provider]  DULoxetine  (CYMBALTA ) 60 MG capsule Take 1 capsule (60 mg total) by mouth 2 (two) times daily. 10/20/22   Angiulli, Toribio PARAS, PA-C  esomeprazole  (NEXIUM ) 40 MG capsule Take 1 capsule (40 mg total) by mouth daily. 02/15/24   Armbruster, Elspeth SQUIBB, MD  fluocinonide ointment (LIDEX) 0.05 %     [provider]  FLUZONE HIGH-DOSE 0.5 ML injection  08/01/23   [provider]  furosemide (LASIX) 20 MG tablet Take 20 mg by mouth daily.    [provider]  gabapentin  (NEURONTIN ) 300 MG capsule Take 1 capsule (300 mg total) by mouth at bedtime. 10/20/22   Angiulli, Daniel J, PA-C  HYDROcodone -acetaminophen  (NORCO) 10-325 MG tablet Take 1 tablet by mouth every 12 (twelve) hours  as needed for severe pain. 10/20/22   Angiulli, Toribio PARAS, PA-C  ibuprofen  (ADVIL ) 600 MG tablet  09/30/20   [provider]  lamoTRIgine  (LAMICTAL ) 150 MG tablet Take 2 tablets (300 mg total) by mouth daily. 10/20/22   Angiulli, Toribio PARAS, PA-C  lamoTRIgine  (LAMICTAL ) 25 MG tablet     [provider]  LIDODERM  5 % SMARTSIG:1-3 Patch(s) Topical Daily 07/02/23   [provider]  linaclotide  (LINZESS ) 72 MCG capsule Take 1 capsule (72 mcg total) by mouth daily before breakfast. 08-2025, lot [id number] 02/15/24   Armbruster, Elspeth SQUIBB, MD  losartan  (COZAAR ) 100 MG tablet Take 1 tablet (100 mg total) by mouth daily. 10/20/22   Angiulli, Toribio PARAS, PA-C   metFORMIN  (GLUCOPHAGE -XR) 750 MG 24 hr tablet Take 1 tablet (750 mg total) by mouth every evening. Patient not taking: Reported on 05/09/2024 10/20/22   Angiulli, Toribio PARAS, PA-C  metoprolol  succinate (TOPROL -XL) 25 MG 24 hr tablet Take 1 tablet (25 mg total) by mouth daily. 09/07/23   Jeffrie Oneil BROCKS, MD  molnupiravir  EUA (LAGEVRIO ) 200 MG CAPS capsule Take 4 capsules by mouth every 12 hours for 5 days Patient not taking: Reported on 05/09/2024 06/15/23     montelukast  (SINGULAIR ) 10 MG tablet Take 1 tablet (10 mg total) by mouth at bedtime. 10/20/22   Angiulli, Toribio PARAS, PA-C  potassium chloride  SA (KLOR-CON  M) 20 MEQ tablet Take 20 mEq by mouth daily. 11/09/22   [provider]  Spacer/Aero-Holding Raguel DEVI 1 each by Does not apply route as needed. 11/20/22   Parrett, Madelin RAMAN, NP  Sulfacetamide Sodium 10 % LIQD  11/11/21   [provider]  VITAMIN D  PO Take 1,000 Units by mouth daily.    [provider]  Vitamin D , Ergocalciferol , (DRISDOL ) 1.25 MG (50000 UNIT) CAPS capsule Take 1 capsule (50,000 Units total) by mouth every 7 (seven) days. 10/20/22   Angiulli, Toribio PARAS, PA-C    Allergies: Latex and Adhesive [tape]    Review of Systems  Constitutional:  Negative for fever.  Respiratory:  Positive for chest tightness and shortness of breath.   Cardiovascular:  Positive for palpitations. Negative for chest pain and leg swelling.  Gastrointestinal:  Negative for abdominal pain, diarrhea and vomiting.  Skin:  Negative for rash.  Neurological:  Negative for headaches.    Updated Vital Signs LMP  (LMP Unknown)   Physical Exam Vitals and nursing note reviewed.  Constitutional:      General: She is not in acute distress.    Appearance: Normal appearance.  HENT:     Head: Normocephalic.     Mouth/Throat:     Mouth: Mucous membranes are moist.  Cardiovascular:     Rate and Rhythm: Normal rate.  Pulmonary:     Effort: Pulmonary effort is normal. No respiratory  distress.  Abdominal:     Palpations: Abdomen is soft.     Tenderness: There is no abdominal tenderness.  Skin:    General: Skin is warm.  Neurological:     Mental Status: She is alert and oriented to person, place, and time. Mental status is at baseline.  Psychiatric:        Mood and Affect: Mood normal.     (all labs ordered are listed, but only abnormal results are displayed) Labs Reviewed  CBC WITH DIFFERENTIAL/PLATELET  BASIC METABOLIC PANEL WITH GFR  MAGNESIUM   TROPONIN I (HIGH SENSITIVITY)    EKG: None  Radiology: No results found.   Procedures  Medications Ordered in the ED - No data to display                                  Medical Decision Making Amount and/or Complexity of Data Reviewed Labs: ordered. Radiology: ordered.   78 year old female with past medical history of episodic SVT presents emergency department after an episode of palpitations, chest heaviness and shortness of breath.  This is similar to her previous episodes of SVT.  She came in today because she could not tell if her heart rate was still high.  Otherwise her symptoms have resolved, she is compliant with her medications and follows with cardiology.  Vitals are normal and stable on arrival.  EKG shows normal sinus rhythm.  Blood work is reassuring, baseline hyponatremia.  Troponins are negative, chest x-ray is clear.  On reevaluation patient has remained in normal sinus rhythm, has no further concerns and will follow-up with cardiology as an outpatient.  Patient at this time appears safe and stable for discharge and close outpatient follow up. Discharge plan and strict return to ED precautions discussed, patient verbalizes understanding and agreement.     Final diagnoses:  None    ED Discharge Orders     None          Bari Roxie HERO, DO 07/02/24 1526

## 2024-07-04 DIAGNOSIS — M5416 Radiculopathy, lumbar region: Secondary | ICD-10-CM | POA: Diagnosis not present

## 2024-07-17 ENCOUNTER — Other Ambulatory Visit (HOSPITAL_BASED_OUTPATIENT_CLINIC_OR_DEPARTMENT_OTHER): Payer: Self-pay

## 2024-07-17 ENCOUNTER — Other Ambulatory Visit: Payer: Self-pay

## 2024-07-17 DIAGNOSIS — E1121 Type 2 diabetes mellitus with diabetic nephropathy: Secondary | ICD-10-CM | POA: Diagnosis not present

## 2024-07-17 DIAGNOSIS — I10 Essential (primary) hypertension: Secondary | ICD-10-CM | POA: Diagnosis not present

## 2024-07-17 DIAGNOSIS — M810 Age-related osteoporosis without current pathological fracture: Secondary | ICD-10-CM | POA: Diagnosis not present

## 2024-07-17 MED ORDER — MOUNJARO 2.5 MG/0.5ML ~~LOC~~ SOAJ
2.5000 mg | SUBCUTANEOUS | 1 refills | Status: AC
  Filled 2024-07-17: qty 2, 28d supply, fill #0

## 2024-07-18 DIAGNOSIS — F319 Bipolar disorder, unspecified: Secondary | ICD-10-CM | POA: Diagnosis not present

## 2024-07-18 DIAGNOSIS — E113392 Type 2 diabetes mellitus with moderate nonproliferative diabetic retinopathy without macular edema, left eye: Secondary | ICD-10-CM | POA: Diagnosis not present

## 2024-07-18 DIAGNOSIS — I10 Essential (primary) hypertension: Secondary | ICD-10-CM | POA: Diagnosis not present

## 2024-07-18 DIAGNOSIS — E209 Hypoparathyroidism, unspecified: Secondary | ICD-10-CM | POA: Diagnosis not present

## 2024-07-18 DIAGNOSIS — M810 Age-related osteoporosis without current pathological fracture: Secondary | ICD-10-CM | POA: Diagnosis not present

## 2024-07-20 DIAGNOSIS — M5416 Radiculopathy, lumbar region: Secondary | ICD-10-CM | POA: Diagnosis not present

## 2024-07-27 ENCOUNTER — Other Ambulatory Visit (HOSPITAL_BASED_OUTPATIENT_CLINIC_OR_DEPARTMENT_OTHER): Payer: Self-pay

## 2024-08-02 DIAGNOSIS — E1121 Type 2 diabetes mellitus with diabetic nephropathy: Secondary | ICD-10-CM | POA: Diagnosis not present

## 2024-08-08 ENCOUNTER — Emergency Department (HOSPITAL_COMMUNITY)

## 2024-08-08 ENCOUNTER — Other Ambulatory Visit: Payer: Self-pay

## 2024-08-08 ENCOUNTER — Emergency Department (HOSPITAL_COMMUNITY): Admission: EM | Admit: 2024-08-08 | Discharge: 2024-08-09 | Disposition: A | Discharge status: Home / Self Care

## 2024-08-08 ENCOUNTER — Encounter (HOSPITAL_COMMUNITY): Payer: Self-pay

## 2024-08-08 DIAGNOSIS — I471 Supraventricular tachycardia, unspecified: Secondary | ICD-10-CM

## 2024-08-08 DIAGNOSIS — R000 Tachycardia, unspecified: Secondary | ICD-10-CM | POA: Insufficient documentation

## 2024-08-08 DIAGNOSIS — Z7982 Long term (current) use of aspirin: Secondary | ICD-10-CM | POA: Diagnosis not present

## 2024-08-08 DIAGNOSIS — D649 Anemia, unspecified: Secondary | ICD-10-CM | POA: Diagnosis not present

## 2024-08-08 DIAGNOSIS — R7989 Other specified abnormal findings of blood chemistry: Secondary | ICD-10-CM | POA: Diagnosis not present

## 2024-08-08 DIAGNOSIS — R0602 Shortness of breath: Secondary | ICD-10-CM | POA: Insufficient documentation

## 2024-08-08 DIAGNOSIS — R002 Palpitations: Secondary | ICD-10-CM | POA: Diagnosis not present

## 2024-08-08 DIAGNOSIS — R231 Pallor: Secondary | ICD-10-CM | POA: Diagnosis not present

## 2024-08-08 DIAGNOSIS — Z556 Problems related to health literacy: Secondary | ICD-10-CM | POA: Diagnosis not present

## 2024-08-08 DIAGNOSIS — I10 Essential (primary) hypertension: Secondary | ICD-10-CM | POA: Insufficient documentation

## 2024-08-08 DIAGNOSIS — Z96612 Presence of left artificial shoulder joint: Secondary | ICD-10-CM | POA: Diagnosis not present

## 2024-08-08 DIAGNOSIS — Z79899 Other long term (current) drug therapy: Secondary | ICD-10-CM | POA: Insufficient documentation

## 2024-08-08 DIAGNOSIS — R079 Chest pain, unspecified: Secondary | ICD-10-CM | POA: Diagnosis not present

## 2024-08-08 DIAGNOSIS — Z91040 Latex allergy status: Secondary | ICD-10-CM | POA: Diagnosis not present

## 2024-08-08 DIAGNOSIS — Z96611 Presence of right artificial shoulder joint: Secondary | ICD-10-CM | POA: Diagnosis not present

## 2024-08-08 DIAGNOSIS — R42 Dizziness and giddiness: Secondary | ICD-10-CM | POA: Diagnosis not present

## 2024-08-08 LAB — BASIC METABOLIC PANEL WITH GFR
Anion gap: 10 (ref 5–15)
BUN: 9 mg/dL (ref 8–23)
CO2: 23 mmol/L (ref 22–32)
Calcium: 9.3 mg/dL (ref 8.9–10.3)
Chloride: 102 mmol/L (ref 98–111)
Creatinine, Ser: 0.69 mg/dL (ref 0.44–1.00)
GFR, Estimated: >60 mL/min (ref >60)
Glucose, Bld: 132 mg/dL — ABNORMAL HIGH (ref 70–99)
Potassium: 3.7 mmol/L (ref 3.5–5.1)
Sodium: 135 mmol/L (ref 135–145)

## 2024-08-08 LAB — CBC
HCT: 37.2 % (ref 36.0–46.0)
Hemoglobin: 11.6 g/dL — ABNORMAL LOW (ref 12.0–15.0)
MCH: 31.5 pg (ref 26.0–34.0)
MCHC: 31.2 g/dL (ref 30.0–36.0)
MCV: 101.1 fL — ABNORMAL HIGH (ref 80.0–100.0)
Platelets: 191 K/uL (ref 150–400)
RBC: 3.68 MIL/uL — ABNORMAL LOW (ref 3.87–5.11)
RDW: 14.6 % (ref 11.5–15.5)
WBC: 7.8 K/uL (ref 4.0–10.5)
nRBC: 0 % (ref 0.0–0.2)

## 2024-08-08 LAB — I-STAT CHEM 8, ED
BUN: 10 mg/dL (ref 8–23)
Calcium, Ion: 1.21 mmol/L (ref 1.15–1.40)
Chloride: 101 mmol/L (ref 98–111)
Creatinine, Ser: 0.7 mg/dL (ref 0.44–1.00)
Glucose, Bld: 130 mg/dL — ABNORMAL HIGH (ref 70–99)
HCT: 37 % (ref 36.0–46.0)
Hemoglobin: 12.6 g/dL (ref 12.0–15.0)
Potassium: 3.5 mmol/L (ref 3.5–5.1)
Sodium: 139 mmol/L (ref 135–145)
TCO2: 25 mmol/L (ref 22–32)

## 2024-08-08 LAB — MAGNESIUM: Magnesium: 1.7 mg/dL (ref 1.7–2.4)

## 2024-08-08 LAB — D-DIMER, QUANTITATIVE: D-Dimer, Quant: 1.67 ug{FEU}/mL — ABNORMAL HIGH (ref 0.00–0.50)

## 2024-08-08 MED ORDER — LACTATED RINGERS IV BOLUS
500.0000 mL | Freq: Once | INTRAVENOUS | Status: AC
  Administered 2024-08-08: 500 mL via INTRAVENOUS

## 2024-08-08 MED ORDER — IOHEXOL 350 MG/ML SOLN
75.0000 mL | Freq: Once | INTRAVENOUS | Status: AC | PRN
  Administered 2024-08-08: 75 mL via INTRAVENOUS

## 2024-08-08 NOTE — ED Triage Notes (Signed)
 PT BIB [facility] from home for 200 HR on home monitor while cooking dinner.    Has had adenosine before  200/60 90% RA 200 HR 18 LAC 6 & 12 adenosine   Post meds 140/70 120

## 2024-08-08 NOTE — ED Provider Notes (Signed)
 [place] EMERGENCY DEPARTMENT AT [facility] Provider Note   CSN: [id number] Arrival date & time: 08/08/24  2102     Patient presents with: No chief complaint on file.   Elizabeth Stone is a 78 y.o. female.   This is a 78 year old female presenting emergency department for SVT.  Said some shortness of breath past few days.  This evening felt her heart was racing and some chest discomfort and some shortness of breath that was worse.  EMS noted heart rate in the 200s, gave adenosine and converted.  She reports that her chest pain is resolved still having some mild shortness of breath.  No recent infection symptoms.  Takes metoprolol  for her SVT, did take it today.        Prior to Admission medications   Medication Sig Start Date End Date Taking? Authorizing Provider  acetaminophen  (TYLENOL ) 325 MG tablet Take 1 tablet (325 mg total) by mouth every 4 (four) hours as needed for mild pain (or temp > 37.5 C (99.5 F)). 10/20/22   Angiulli, Toribio PARAS, PA-C  albuterol  (VENTOLIN  HFA) 108 (90 Base) MCG/ACT inhaler Inhale 1-2 puffs into the lungs every 6 (six) hours as needed. 11/19/22   Parrett, Madelin RAMAN, NP  alendronate (FOSAMAX) 70 MG tablet Take 70 mg by mouth once a week.    [provider]  Ascorbic Acid  (VITAMIN C  ER PO) Vitamin C     [provider]  Asenapine  Maleate 10 MG SUBL Place 1 tablet under the tongue at bedtime. 09/06/22   [provider]  aspirin  EC 81 MG tablet Take 1 tablet (81 mg total) by mouth daily. Swallow whole. 10/12/22   de Clint Kill, Cortney E, NP  atorvastatin  (LIPITOR) 20 MG tablet Take 1 tablet (20 mg total) by mouth at bedtime. 10/20/22   Angiulli, Daniel J, PA-C  B Complex-C (B-COMPLEX WITH VITAMIN C ) tablet Take 1 tablet by mouth daily. 10/20/22   Angiulli, Toribio PARAS, PA-C  buPROPion  (WELLBUTRIN  XL) 300 MG 24 hr tablet Take 1 tablet (300 mg total) by mouth daily. 10/20/22   Angiulli, Toribio PARAS, PA-C  cetirizine (ZYRTEC) 10 MG chewable  tablet Chew 10 mg by mouth at bedtime.    [provider]  CHLORTHALIDONE  PO     [provider]  clonazePAM  (KLONOPIN ) 1 MG tablet Take 1 tablet (1 mg total) by mouth at bedtime. 10/20/22   Angiulli, Toribio PARAS, PA-C  Dermatological Products, Misc. [facility] ROSACEA EX)  08/21/20   [provider]  DICLOFENAC POTASSIUM PO     [provider]  DULoxetine  (CYMBALTA ) 60 MG capsule Take 1 capsule (60 mg total) by mouth 2 (two) times daily. 10/20/22   Angiulli, Toribio PARAS, PA-C  esomeprazole  (NEXIUM ) 40 MG capsule Take 1 capsule (40 mg total) by mouth daily. 02/15/24   Armbruster, Elspeth SQUIBB, MD  fluocinonide ointment (LIDEX) 0.05 %     [provider]  FLUZONE HIGH-DOSE 0.5 ML injection  08/01/23   [provider]  furosemide (LASIX) 20 MG tablet Take 20 mg by mouth daily.    [provider]  gabapentin  (NEURONTIN ) 300 MG capsule Take 1 capsule (300 mg total) by mouth at bedtime. 10/20/22   Angiulli, Daniel J, PA-C  HYDROcodone -acetaminophen  (NORCO) 10-325 MG tablet Take 1 tablet by mouth every 12 (twelve) hours as needed for severe pain. 10/20/22   Angiulli, Toribio PARAS, PA-C  ibuprofen  (ADVIL ) 600 MG tablet  09/30/20   [provider]  lamoTRIgine  (LAMICTAL )  150 MG tablet Take 2 tablets (300 mg total) by mouth daily. 10/20/22   Angiulli, Toribio PARAS, PA-C  lamoTRIgine  (LAMICTAL ) 25 MG tablet     [provider]  LIDODERM  5 % SMARTSIG:1-3 Patch(s) Topical Daily 07/02/23   [provider]  linaclotide  (LINZESS ) 72 MCG capsule Take 1 capsule (72 mcg total) by mouth daily before breakfast. 08-2025, lot [id number] 02/15/24   Armbruster, Elspeth SQUIBB, MD  losartan  (COZAAR ) 100 MG tablet Take 1 tablet (100 mg total) by mouth daily. 10/20/22   Angiulli, Toribio PARAS, PA-C  metFORMIN  (GLUCOPHAGE -XR) 750 MG 24 hr tablet Take 1 tablet (750 mg total) by mouth every evening. Patient not taking: Reported on 05/09/2024 10/20/22   Angiulli, Toribio PARAS, PA-C   metoprolol  succinate (TOPROL -XL) 25 MG 24 hr tablet Take 1 tablet (25 mg total) by mouth daily. 09/07/23   Jeffrie Oneil BROCKS, MD  molnupiravir  EUA (LAGEVRIO ) 200 MG CAPS capsule Take 4 capsules by mouth every 12 hours for 5 days Patient not taking: Reported on 05/09/2024 06/15/23     montelukast  (SINGULAIR ) 10 MG tablet Take 1 tablet (10 mg total) by mouth at bedtime. 10/20/22   Angiulli, Toribio PARAS, PA-C  potassium chloride  SA (KLOR-CON  M) 20 MEQ tablet Take 20 mEq by mouth daily. 11/09/22   [provider]  Spacer/Aero-Holding Raguel DEVI 1 each by Does not apply route as needed. 11/20/22   Parrett, Madelin RAMAN, NP  Sulfacetamide Sodium 10 % LIQD  11/11/21   [provider]  tirzepatide CLOYDE) 2.5 MG/0.5ML Pen Inject 2.5 mg into the skin once a week. 07/17/24     VITAMIN D  PO Take 1,000 Units by mouth daily.    [provider]  Vitamin D , Ergocalciferol , (DRISDOL ) 1.25 MG (50000 UNIT) CAPS capsule Take 1 capsule (50,000 Units total) by mouth every 7 (seven) days. 10/20/22   Angiulli, Toribio PARAS, PA-C    Allergies: Latex and Adhesive [tape]    Review of Systems  Updated Vital Signs BP (!) 167/66   Pulse 100   Temp 97.8 F (36.6 C)   Resp (!) 24   Ht 5' 1 (1.549 m)   Wt 78 kg   LMP  (LMP Unknown)   SpO2 100%   BMI 32.50 kg/m   Physical Exam Vitals and nursing note reviewed.  Constitutional:      General: She is not in acute distress.    Appearance: She is not toxic-appearing.  HENT:     Head: Normocephalic and atraumatic.     Nose: Nose normal.     Mouth/Throat:     Mouth: Mucous membranes are moist.  Eyes:     Conjunctiva/sclera: Conjunctivae normal.  Cardiovascular:     Rate and Rhythm: Regular rhythm. Tachycardia present.  Pulmonary:     Effort: Pulmonary effort is normal.     Breath sounds: Normal breath sounds.  Abdominal:     General: Abdomen is flat. There is no distension.     Tenderness: There is no abdominal tenderness. There is no guarding or  rebound.  Musculoskeletal:     Right lower leg: No edema.     Left lower leg: No edema.  Skin:    General: Skin is warm and dry.     Capillary Refill: Capillary refill takes less than 2 seconds.  Neurological:     Mental Status: She is alert and oriented to person, place, and time.  Psychiatric:        Mood and Affect: Mood normal.  Behavior: Behavior normal.     (all labs ordered are listed, but only abnormal results are displayed) Labs Reviewed  CBC - Abnormal; Notable for the following components:      Result Value   RBC 3.68 (*)    Hemoglobin 11.6 (*)    MCV 101.1 (*)    All other components within normal limits  BASIC METABOLIC PANEL WITH GFR - Abnormal; Notable for the following components:   Glucose, Bld 132 (*)    All other components within normal limits  D-DIMER, QUANTITATIVE - Abnormal; Notable for the following components:   D-Dimer, Quant 1.67 (*)    All other components within normal limits  I-STAT CHEM 8, ED - Abnormal; Notable for the following components:   Glucose, Bld 130 (*)    All other components within normal limits  MAGNESIUM     EKG: EKG Interpretation Date/Time:  Tuesday August 08 2024 21:12:22 EDT Ventricular Rate:  107 PR Interval:  157 QRS Duration:  81 QT Interval:  369 QTC Calculation: 493 R Axis:   40  Text Interpretation: Sinus tachycardia Low voltage, precordial leads Borderline prolonged QT interval Confirmed by Neysa Clap [id number]) on 08/08/2024 9:26:34 PM  Radiology: CT Angio Chest PE W and/or Wo Contrast Result Date: 08/08/2024 CLINICAL DATA:  Pulmonary embolism (PE) suspected, low to intermediate prob, positive D-dimer, tachycardia EXAM: CT ANGIOGRAPHY CHEST WITH CONTRAST TECHNIQUE: Multidetector CT imaging of the chest was performed using the standard protocol during bolus administration of intravenous contrast. Multiplanar CT image reconstructions and MIPs were obtained to evaluate the vascular anatomy. RADIATION DOSE  REDUCTION: This exam was performed according to the departmental dose-optimization program which includes automated exposure control, adjustment of the mA and/or kV according to patient size and/or use of iterative reconstruction technique. CONTRAST:  75mL OMNIPAQUE  IOHEXOL  350 MG/ML SOLN COMPARISON:  12/01/2022, 08/08/2024 FINDINGS: Cardiovascular: This is a technically adequate evaluation of the pulmonary vasculature. No filling defects or pulmonary emboli. The heart is unremarkable without pericardial effusion. No evidence of thoracic aortic aneurysm or dissection. Atherosclerosis of the aorta and coronary vasculature. Mediastinum/Nodes: Stable appearance of the thyroid  and trachea. Fluid-filled thoracic esophagus may reflect sequela of reflux. No pathologic adenopathy. Lungs/Pleura: No acute airspace disease, effusion, or pneumothorax. Central airways are patent. Upper Abdomen: Nodular contour of the liver consistent with cirrhosis. Borderline splenomegaly. Gastric and esophageal varices are noted. Musculoskeletal: No acute displaced fractures. Chronic bilateral healed rib fractures. Reconstructed images demonstrate no additional findings. Review of the MIP images confirms the above findings. IMPRESSION: 1. No evidence of pulmonary embolus. 2. No acute intrathoracic process. 3. Fluid-filled thoracic esophagus may reflect sequela of reflux. 4. Cirrhotic morphology of the liver. 5. Likely portal venous hypertension manifested by upper abdominal varices and splenomegaly. Electronically Signed   By: Ozell Daring M.D.   On: 08/08/2024 23:33   DG Chest 2 View Result Date: 08/08/2024 CLINICAL DATA:  Short of breath, SVT EXAM: CHEST - 2 VIEW COMPARISON:  07/02/2024 FINDINGS: Frontal and lateral views of the chest demonstrate an unremarkable cardiac silhouette. No acute airspace disease, effusion, or pneumothorax. Prior healed left rib fractures. Bilateral shoulder arthroplasties. IMPRESSION: 1. No acute  intrathoracic process. Electronically Signed   By: Ozell Daring M.D.   On: 08/08/2024 21:50     Procedures   Medications Ordered in the ED  lactated ringers  bolus 500 mL (0 mLs Intravenous Stopped 08/08/24 2242)  iohexol  (OMNIPAQUE ) 350 MG/ML injection 75 mL (75 mLs Intravenous Contrast Given 08/08/24 2326)    Clinical Course  as of 08/08/24 2348  Tue Aug 08, 2024  2114 Echo in December 2023 with normal EF [TY]  2116 PMH per my chart review: history of hypertension, prediabetes, anxiety, GERD, bipolar disorder, deafness, cochlear implants, and Meniere's, SVT [TY]  2305 D-Dimer, Quant(!): 1.67 Will get CTA [TY]    Clinical Course User Index [TY] Neysa Caron PARAS, DO                                 Medical Decision Making This is a 78 year old female complex past medical history as noted in ED course presenting emergency department palpitations/SVT.  Did have some chest pain associated with that since resolved.  History of SVT on metoprolol .  EMS reported giving adenosine prior to arrival with conversion from heart rate in the 200s to sinus tach.  Heart rate continue to be mildly elevated, given her complaint of shortness of breath concern for possible PE.  D-dimer slightly elevated, therefore pursued CTA.  Chest x-ray without pneumonia pneumothorax no significant metabolic derangements.  Stable anemia.  Care was signed out to overnight team pending CTA and reevaluation of patient hr after IV fluids  Amount and/or Complexity of Data Reviewed External Data Reviewed:     Details: See ED course Labs: ordered.    Details: See above Radiology: ordered and independent interpretation performed.    Details: No pneumonia pneumothorax ECG/medicine tests:     Details: Sinus tachycardia.  No ischemic changes  Risk Prescription drug management. Decision regarding hospitalization. Diagnosis or treatment significantly limited by social determinants of health. Risk Details: Poor health  literacy       Final diagnoses:  None    ED Discharge Orders     None          Neysa Caron PARAS, DO 08/08/24 2348

## 2024-08-09 DIAGNOSIS — I471 Supraventricular tachycardia, unspecified: Secondary | ICD-10-CM | POA: Diagnosis not present

## 2024-08-09 NOTE — Discharge Instructions (Signed)
 You had a CT scan performed in the emergency department today. The CT scan showed changes to your liver that are seen with cirrhosis or liver disease. Please follow-up with your gastroenterologist for further evaluation. Also contact your cardiologist for follow-up as well.  Get rechecked immediately if you have any bloody vomit or black or bloody stools.

## 2024-08-09 NOTE — ED Notes (Signed)
 Pt verbalized understanding of discharge instructions.

## 2024-08-10 DIAGNOSIS — G8929 Other chronic pain: Secondary | ICD-10-CM | POA: Diagnosis not present

## 2024-08-10 DIAGNOSIS — M5416 Radiculopathy, lumbar region: Secondary | ICD-10-CM | POA: Diagnosis not present

## 2024-08-10 DIAGNOSIS — M7918 Myalgia, other site: Secondary | ICD-10-CM | POA: Diagnosis not present

## 2024-08-15 ENCOUNTER — Ambulatory Visit

## 2024-08-15 ENCOUNTER — Telehealth: Payer: Self-pay | Admitting: Gastroenterology

## 2024-08-15 DIAGNOSIS — K7460 Unspecified cirrhosis of liver: Secondary | ICD-10-CM

## 2024-08-15 NOTE — Progress Notes (Unsigned)
 Cardiology Office Note   Date:  08/17/2024  ID:  Elizabeth, Stone April 08, 1946, MRN [id number] PCP: Nanci Senior, MD  [facility] Providers Cardiologist:  Oneil Parchment, MD   History of Present Illness Elizabeth Stone is a 78 y.o. female with a history of hypertension, prediabetes, anxiety, GERD, bipolar disorder, cerebellar stroke, deafness, cochlear implants, and Meniere's disease presented for evaluation of supraventricular tachycardia (SVT). The patient had an episode of severe chest pressure, shortness of breath, and neck tightness on 09/02/2023, which led to an emergency department visit. The patient may have fainted during this episode. Upon EMS arrival, the patient was reportedly in SVT and was converted with one dose of adenosine 6mg , which significantly improved the chest pain.   The patient had a similar episode in June 2024, which was initially attributed to anxiety. The patient also reported a history of stroke-like symptoms in December 2023, presenting with gait abnormality and received IV TNK. The patient has had severe vertigo issues since she 30s. A CT scan showed focal proximal LID and circumferential artery calcification. The patient had a low-risk nuclear stress test in 2020.   The patient reported episodes of palpitations, which first occurred in March and were initially diagnosed as panic attacks. The patient described the sensation as if someone was standing on her chest, causing difficulty breathing. The patient also reported fainting during these episodes. The patient's medication regimen includes aspirin , atorvastatin , losartan , and recently added Lasix.   The patient reported recent weight gain of 7.5 pounds, with swelling in the face, hands, and ankles, and difficulty breathing. The patient also reported anemia and is scheduled for an endoscopy and colonoscopy to investigate potential blood loss. The patient's hemoglobin was 11.6, and LDL was 67. The patient was  previously on iron  supplements but was recently taken off.   Today, she presents with supraventricular tachycardia with episodes of rapid heart rate.  She experiences episodes of rapid heart rate, with a pulse reaching 198 beats per minute. These episodes have occurred twice recently, with one instance requiring hospital intervention. She forgot to take her metoprolol  the night before one episode and is concerned about when to call emergency services due to cost. She uses techniques to lower her heart rate independently.  She takes metoprolol  succinate daily and amlodipine  for blood pressure. She feels dizzy and lightheaded, particularly after taking a shower.  She experiences shortness of breath, which others have noticed, and uses an inhaler as needed, providing some relief.  She reports progressive weakness and heaviness in her legs over the past year, worsening in the last three months. She needs to rest frequently when walking short distances. She is concerned about her ability to continue her exercise routine due to her symptoms.  She has a history of congestive heart failure and is concerned about the impact of SVT on her heart. She is unsure about recent heart ultrasound results and worries about her heart's function due to increasing shortness of breath and weakness.  Reports no chest pain, pressure, or tightness. No edema, orthopnea, PND.   Discussed the use of AI scribe software for clinical note transcription with the patient, who gave verbal consent to proceed.   ROS: pertinent ROS in HPI  Studies Reviewed     Stress Test 10-15-2023    The study is normal. The study is low risk.   No ST deviation was noted.   LV perfusion is normal. There is no evidence of ischemia. There is no evidence of  infarction.   Left ventricular function is normal. End diastolic cavity size is normal. End systolic cavity size is normal.   Prior study available for comparison from 11/18/2021.       Physical Exam VS:  BP (!) 120/52 (BP Location: Left Arm, Patient Position: Sitting, Cuff Size: Normal)   Pulse 71   Resp 19   Ht 5' 1 (1.549 m)   Wt 179 lb (81.2 kg)   LMP  (LMP Unknown)   SpO2 94%   BMI 33.82 kg/m        Wt Readings from Last 3 Encounters:  08/17/24 179 lb (81.2 kg)  08/08/24 172 lb (78 kg)  07/02/24 170 lb (77.1 kg)    GEN: Well nourished, well developed in no acute distress NECK: No JVD; No carotid bruits CARDIAC: RRR, no murmurs, rubs, gallops RESPIRATORY:  Clear to auscultation without rales, wheezing or rhonchi  ABDOMEN: Soft, non-tender, non-distended EXTREMITIES:  No edema; No deformity   ASSESSMENT AND PLAN  Supraventricular tachycardia Recurrent episodes with high heart rates, risk of cardiac damage with frequent episodes. - Prescribe short-acting metoprolol  tartrate for rapid heart rate episodes. - Educated on metoprolol  use to reduce EMS calls. - Check availability of metoprolol  samples. - Discussed Valsalva maneuver for episode management.  Heart failure Increasing shortness of breath and weakness, potential heart failure involvement. - Order echocardiogram to assess heart pump function and structure. - Discussed non-invasive nature of echocardiogram.  Progressive weakness and heaviness in legs and shortness of breath Progressive leg weakness and heaviness, worsening shortness of breath, possibly related to heart failure or other causes. - Evaluate heart function with echocardiogram to determine heart failure contribution. - Consider contribution from back issues, with upcoming back surgery.  Essential hypertension Low blood pressure possibly causing dizziness, long-term amlodipine  use may be unnecessary. - Discontinue amlodipine  to address low blood pressure and dizziness. - Monitor blood pressure at home regularly.    Dispo: She can follow-up in 6 weeks to review results  Signed, Orren LOISE Fabry, PA-C

## 2024-08-15 NOTE — Telephone Encounter (Signed)
 Inbound call from patient requesting to speak with Dr. Jacqualine nurse. She states that Dr. Leigh referred her to ENT and the ENT referred her to speech pathology. Please advise.

## 2024-08-15 NOTE — Telephone Encounter (Signed)
 Patient calling to make Dr Leigh aware that she had some cardiac issues last week necessitating an ER visit at which time imaging was completed. She states that they found abnormalities of the liver and she wants to know if she needs additional imaging or testing for this.  IMPRESSION: 1. No evidence of pulmonary embolus. 2. No acute intrathoracic process. 3. Fluid-filled thoracic esophagus may reflect sequela of reflux. 4. Cirrhotic morphology of the liver. 5. Likely portal venous hypertension manifested by upper abdominal varices and splenomegaly.

## 2024-08-16 NOTE — Telephone Encounter (Signed)
 Left message for patient to call back.  She is scheduled to see Dr Leigh in follow up on 08/23/24 at 11 am.  Lab orders have been entered in EPIC to be completed as well.

## 2024-08-16 NOTE — Telephone Encounter (Signed)
 Thanks for letting me know.  Sorry to hear this.  It does appear that she incidentally has cirrhosis of the liver based on this imaging.  Her platelet count has actually been normal over time as has her LFTs.  She needs a follow-up visit with me or APP in the next month or so if possible to discuss this.  I do think I have an opening at 830 AM today today if she has any room to come in this AM, but I know this is very short notice.  She otherwise needs to go to the lab, needs LFTs and INR drawn in addition to the following serologies: Hepatitis B surface antigen, hepatitis C antibody, hepatitis B surface antibody, hepatitis A antibody, ANA, IgG, AMA, smooth muscle antibody, alpha one antitrypsin.  Thanks. There is a lot to discuss with this finding, best in person for a visit. If she can't come today let me know and I will see what I can find in the schedule

## 2024-08-17 ENCOUNTER — Encounter: Payer: Self-pay | Admitting: Physician Assistant

## 2024-08-17 ENCOUNTER — Ambulatory Visit: Attending: Internal Medicine | Admitting: Physician Assistant

## 2024-08-17 VITALS — BP 120/52 | HR 71 | Resp 19 | Ht 61.0 in | Wt 179.0 lb

## 2024-08-17 DIAGNOSIS — E782 Mixed hyperlipidemia: Secondary | ICD-10-CM | POA: Diagnosis not present

## 2024-08-17 DIAGNOSIS — I471 Supraventricular tachycardia, unspecified: Secondary | ICD-10-CM

## 2024-08-17 DIAGNOSIS — R531 Weakness: Secondary | ICD-10-CM

## 2024-08-17 DIAGNOSIS — D649 Anemia, unspecified: Secondary | ICD-10-CM | POA: Diagnosis not present

## 2024-08-17 DIAGNOSIS — I639 Cerebral infarction, unspecified: Secondary | ICD-10-CM

## 2024-08-17 DIAGNOSIS — I10 Essential (primary) hypertension: Secondary | ICD-10-CM

## 2024-08-17 DIAGNOSIS — R0602 Shortness of breath: Secondary | ICD-10-CM

## 2024-08-17 MED ORDER — METOPROLOL TARTRATE 25 MG PO TABS: 12.5000 mg | ORAL_TABLET | Freq: Every day | ORAL | 0 refills | Status: DC | PRN

## 2024-08-17 NOTE — Telephone Encounter (Signed)
 Left message for patient to call back

## 2024-08-17 NOTE — Patient Instructions (Signed)
 Medication Instructions:  STOP Norvasc  (amlodipine ) START Metoprolol  Tartrate 12.5mg  Take as needed for palpitations or fast heart rate If you need a refill on your cardiac medications before your next appointment, please call your pharmacy*  Lab Work: None ordered If you have labs (blood work) drawn today and your tests are completely normal, you will receive your results only by: MyChart Message (if you have MyChart) OR A paper copy in the mail If you have any lab test that is abnormal or we need to change your treatment, we will call you to review the results.  Testing/Procedures: Your physician has requested that you have an STAT echocardiogram. Echocardiography is a painless test that uses sound waves to create images of your heart. It provides your doctor with information about the size and shape of your heart and how well your heart's chambers and valves are working. This procedure takes approximately one hour. There are no restrictions for this procedure. Please do NOT wear cologne, perfume, aftershave, or lotions (deodorant is allowed). Please arrive 15 minutes prior to your appointment time.  Please note: We ask at that you not bring children with you during ultrasound (echo/ vascular) testing. Due to room size and safety concerns, children are not allowed in the ultrasound rooms during exams. Our front office staff cannot provide observation of children in our lobby area while testing is being conducted. An adult accompanying a patient to their appointment will only be allowed in the ultrasound room at the discretion of the ultrasound technician under special circumstances. We apologize for any inconvenience.   Follow-Up: At [facility], you and your health needs are our priority.  As part of our continuing mission to provide you with exceptional heart care, our providers are all part of one team.  This team includes your primary Cardiologist (physician) and Advanced  Practice Providers or APPs (Physician Assistants and Nurse Practitioners) who all work together to provide you with the care you need, when you need it.  Your next appointment:   6 week(s)  Provider:   Oneil Parchment, MD or Orren Fabry, PA   We recommend signing up for the patient portal called MyChart.  Sign up information is provided on this After Visit Summary.  MyChart is used to connect with patients for Virtual Visits (Telemedicine).  Patients are able to view lab/test results, encounter notes, upcoming appointments, etc.  Non-urgent messages can be sent to your provider as well.   To learn more about what you can do with MyChart, go to [email or web address].   Other Instructions

## 2024-08-18 DIAGNOSIS — F319 Bipolar disorder, unspecified: Secondary | ICD-10-CM | POA: Diagnosis not present

## 2024-08-18 DIAGNOSIS — E209 Hypoparathyroidism, unspecified: Secondary | ICD-10-CM | POA: Diagnosis not present

## 2024-08-18 DIAGNOSIS — M810 Age-related osteoporosis without current pathological fracture: Secondary | ICD-10-CM | POA: Diagnosis not present

## 2024-08-18 DIAGNOSIS — I10 Essential (primary) hypertension: Secondary | ICD-10-CM | POA: Diagnosis not present

## 2024-08-18 DIAGNOSIS — E113392 Type 2 diabetes mellitus with moderate nonproliferative diabetic retinopathy without macular edema, left eye: Secondary | ICD-10-CM | POA: Diagnosis not present

## 2024-08-18 NOTE — Telephone Encounter (Signed)
 Left message for patient to call back

## 2024-08-18 NOTE — Telephone Encounter (Signed)
 Left additional message for patient to callback.

## 2024-08-21 NOTE — Telephone Encounter (Signed)
 5th message left asking patient to call back.

## 2024-08-22 NOTE — Telephone Encounter (Signed)
 6th message left asking patient to call back.  I also attempted to reach patient's son, [name] (alternate contact) to see if I could get in touch with patient through him. Unfortunately, number provided for John rings several times then turns to a busy signal.

## 2024-08-23 ENCOUNTER — Ambulatory Visit: Admitting: Gastroenterology

## 2024-08-23 ENCOUNTER — Ambulatory Visit: Payer: Self-pay | Admitting: Adult Health

## 2024-08-23 NOTE — Telephone Encounter (Signed)
 FYI Only or Action Required?: FYI only for provider: ED advised.  Patient is followed in Pulmonology for reactive airway disease, last seen on 11/19/2022 by Elizabeth Stone, Madelin RAMAN, NP.  Called Nurse Triage reporting Shortness of Breath.  Symptoms began 3 weeks ago.  Interventions attempted: Rescue inhaler.  Symptoms are: gradually worsening.  Triage Disposition: Go to ED Now (Notify PCP)  Patient/caregiver understands and will follow disposition?: No, refuses disposition      Copied from CRM [id number]. Topic: Clinical - Red Word Triage >> Aug 23, 2024  3:02 PM Russell PARAS wrote: [facility] that prompted transfer to Nurse Triage:   SOB began getting worse 3 weeks ago Wheezing Audibly is struggling while on phone, long pauses between words where she is catching her breath  Pt  of Elizabeth Stone       Reason for Disposition  [1] MODERATE difficulty breathing (e.g., speaks in phrases, SOB even at rest, pulse 100-120) AND [2] NEW-onset or WORSE than normal  Answer Assessment - Initial Assessment Questions Patient refusing to go to the ED. She is requesting a call from Dr. Shelah.     1. RESPIRATORY STATUS: Describe your breathing? (e.g., wheezing, shortness of breath, unable to speak, severe coughing)      Shortness of breath, getting out of breath very easily  2. ONSET: When did this breathing problem begin?      3 weeks 3. PATTERN Does the difficult breathing come and go, or has it been constant since it started?      Constant  4. SEVERITY: How bad is your breathing? (e.g., mild, moderate, severe)      Moderate to severe  5. RECURRENT SYMPTOM: Have you had difficulty breathing before? If Yes, ask: When was the last time? and What happened that time?      Yes  6. CARDIAC HISTORY: Do you have any history of heart disease? (e.g., heart attack, angina, bypass surgery, angioplasty)      Hypertension  7. LUNG HISTORY: Do you have any history of lung disease?  (e.g., pulmonary  embolus, asthma, emphysema)     Reactive airway disease  8. CAUSE: What do you think is causing the breathing problem?      Unsure  9. OTHER SYMPTOMS: Do you have any other symptoms? (e.g., chest pain, cough, dizziness, fever, runny nose)     Feeling fatigued, wheezing  10. O2 SATURATION MONITOR:  Do you use an oxygen saturation monitor (pulse oximeter) at home? If Yes, ask: What is your reading (oxygen level) today? What is your usual oxygen saturation reading? (e.g., 95%)       No  Protocols used: Breathing Difficulty-A-AH

## 2024-08-23 NOTE — Telephone Encounter (Signed)
 ATC patient x1.  LVM that I hope she had taken the recommendations from the triage nurse from earlier today.

## 2024-08-23 NOTE — Telephone Encounter (Signed)
 Inbound call from patient requesting to speak to [facility]. Patient is requesting a call back at [phone]. Please advise.

## 2024-08-23 NOTE — Telephone Encounter (Signed)
 Patient missed her appointment today.  I have attempted to call patient back. This time, phone rings for long period of time but no voicemail is available.  If patient calls back, she needs to schedule follow up with Dr Leigh for his next availability to discuss cirrhosis diagnosis.

## 2024-08-23 NOTE — Telephone Encounter (Signed)
 Nothing further needed, she needs to be evaluated in the ER.

## 2024-08-24 DIAGNOSIS — E1121 Type 2 diabetes mellitus with diabetic nephropathy: Secondary | ICD-10-CM | POA: Diagnosis not present

## 2024-08-31 DIAGNOSIS — G8929 Other chronic pain: Secondary | ICD-10-CM | POA: Diagnosis not present

## 2024-08-31 DIAGNOSIS — R0600 Dyspnea, unspecified: Secondary | ICD-10-CM | POA: Diagnosis not present

## 2024-09-01 ENCOUNTER — Telehealth: Payer: Self-pay | Admitting: Cardiology

## 2024-09-01 ENCOUNTER — Telehealth: Payer: Self-pay | Admitting: Pulmonary Disease

## 2024-09-01 ENCOUNTER — Telehealth: Payer: Self-pay

## 2024-09-01 ENCOUNTER — Ambulatory Visit (HOSPITAL_BASED_OUTPATIENT_CLINIC_OR_DEPARTMENT_OTHER): Payer: Self-pay | Admitting: Adult Health

## 2024-09-01 NOTE — Telephone Encounter (Signed)
 Spoke with patient regarding shortness of breath. Pt has chronic shortness of breath and oxygen sats 92%. No apparent respiratory distress. Spoke with ECHO and offered sooner appointment (per patient request) and pt declined. Also, spoke with Orren, PA regarding patient and attempted to call patient with appointment with Dr. Jeffrie (also per pt request) and no answer. Appt held for Dr. Jeffrie until speaking with pt. No voicemail to leave message.  Will try to call back.  Thank you,  Powell, RN [facility] Triage

## 2024-09-01 NOTE — Telephone Encounter (Signed)
Attempted to call patient x 2.

## 2024-09-01 NOTE — Telephone Encounter (Signed)
Attempted to call patient x 3

## 2024-09-01 NOTE — Telephone Encounter (Signed)
 Pt scheduled to see Dr. Leigh 10/10/24 at 2:30pm. Pt aware of appt.

## 2024-09-01 NOTE — Telephone Encounter (Signed)
Patient requesting to speak with a nurse. Please advise

## 2024-09-01 NOTE — Telephone Encounter (Signed)
 This RN made first attempt to reach patient, phone line kept ringing, no answer and unable to leave voicemail.  Copied from CRM #[id number]. Topic: Clinical - Red Word Triage >> Sep 01, 2024 11:13 AM Isabell A wrote: Red Word that prompted transfer to Nurse Triage: SOB - Patient  is struggling while on phone, long pauses between words where she is catching her breath >> Sep 01, 2024 11:26 AM Farrel B wrote: PT MRN [id number] pt called in earlier sob was struggling to breath while speaking with me states shes been having issues since colonoscopy, not sure if anyone has gotten her on the line but she stated that it was showing [place] she had been missing calls was a RED WORD attempted to conference her in but she wasn't there

## 2024-09-01 NOTE — Telephone Encounter (Addendum)
 Addendum 09/01/24: this writer contacted on call provider Dr. Catherine to discuss patient symptoms and refusal of ED. Dr. Catherine will contact patient.     FYI Only or Action Required?: Action required by provider: request for appointment.  Patient is followed in Pulmonology for reactive airway disease, last seen on 11/19/2022 by Parrett, Madelin RAMAN, NP.  Called Nurse Triage reporting Shortness of Breath.  Symptoms began several days ago.  Interventions attempted: Rescue inhaler.  Symptoms are: unchanged.  Triage Disposition: See HCP Within 4 Hours (Or PCP Triage)  Patient/caregiver understands and will follow disposition?:     Reason for Disposition  [1] MILD difficulty breathing (e.g., minimal/no SOB at rest, SOB with walking, pulse < 100) AND [2] NEW-onset or WORSE than normal  Answer Assessment - Initial Assessment Questions Additional info: Patient reports shortness of breath X 3-4 years, worsening over several days to point she is gasping for air,  feels she cannot take in a breath, using albuterol  without effect, her o2 is 92% on RA. Patient is wanting an appointment with Dr. Shelah. Advised patients no appointments are available and to proceed to urgent care, she refuses urgent care and insisting on next available with Dr. Shelah. Patient aware message is being sent to clinic for further assistance, aware of recommendation to proceed to urgent care today.    [email or web address] Pulmonary Triage - Initial Assessment Questions Chief Complaint (e.g., cough, sob, wheezing, fever, chills, sweat or additional symptoms) *Go to specific symptom protocol after initial questions. Shortness of breath, having difficulty with inspiration   How long have symptoms been present? 3-4 years ago, worsening over several   Have you tested for COVID or Flu? Note: If not, ask patient if a home test can be taken. If so, instruct patient to call back for positive results. No  MEDICINES:   Have you used any  OTC meds to help with symptoms? No If yes, ask What medications?   Have you used your inhalers/maintenance medication? Yes If yes, What medications? Albuterol  doesn't seem to have much effect  If inhaler, ask How many puffs and how often? Note: Review instructions on medication in the chart.   OXYGEN: Do you wear supplemental oxygen? No If yes, How many liters are you supposed to use?   Do you monitor your oxygen levels? Yes If yes, What is your reading (oxygen level) today? 92  What is your usual oxygen saturation reading?  (Note: Pulmonary O2 sats should be 90% or greater) 90's    1. RESPIRATORY STATUS: Describe your breathing? (e.g., wheezing, shortness of breath, unable to speak, severe coughing)      Shortness of breath  2. ONSET: When did this breathing problem begin?      3-4 years ago  3. PATTERN Does the difficult breathing come and go, or has it been constant since it started?      Constant but worsening  4. SEVERITY: How bad is your breathing? (e.g., mild, moderate, severe)      Getting worse  5. RECURRENT SYMPTOM: Have you had difficulty breathing before? If Yes, ask: When was the last time? and What happened that time?      yes 6. CARDIAC HISTORY: Do you have any history of heart disease? (e.g., heart attack, angina, bypass surgery, angioplasty)       7. LUNG HISTORY: Do you have any history of lung disease?  (e.g., pulmonary embolus, asthma, emphysema)      8. CAUSE: What do you think is causing the breathing  problem?      Unsure  9. OTHER SYMPTOMS: Do you have any other symptoms? (e.g., chest pain, cough, dizziness, fever, runny nose)      10. O2 SATURATION MONITOR:  Do you use an oxygen saturation monitor (pulse oximeter) at home? If Yes, ask: What is your reading (oxygen level) today? What is your usual oxygen saturation reading? (e.g., 95%)       92 11. PREGNANCY: Is there any chance you are pregnant?  When was your last menstrual period?        12. TRAVEL: Have you traveled out of the country in the last month? (e.g., travel history, exposures)  Protocols used: Breathing Difficulty-A-AH

## 2024-09-01 NOTE — Telephone Encounter (Signed)
 Called again no answer

## 2024-09-01 NOTE — Telephone Encounter (Signed)
 Looks as thought you left pt message

## 2024-09-01 NOTE — Telephone Encounter (Signed)
 Pt states she saw her pcp for issues with SOB and he advised her to see her cardiologist. PCP feels 11/21 echo and 12/12 appt are too far out. Checked for sooner appts and saw none. Pt audibly SOB on phone. Please advise. Call transferred.   Pt c/o Shortness Of Breath: STAT if SOB developed within the last 24 hours or pt is noticeably SOB on the phone  1. Are you currently SOB (can you hear that pt is SOB on the phone)? SOB   2. How long have you been experiencing SOB? 3 years, most recent episode has been past few days   3. Are you SOB when sitting or when up moving around? Both   4. Are you currently experiencing any other symptoms? Fatigue and weakness

## 2024-09-01 NOTE — Telephone Encounter (Signed)
 Attempted to call patient   No answer   Left a voice message

## 2024-09-02 NOTE — Telephone Encounter (Signed)
 Called again. Patient did not answer.

## 2024-09-04 NOTE — Telephone Encounter (Signed)
 Left message for patient to callback.  Appointment with Dr. Jeffrie is currently being held for patient (if she wants it) for 09/20/2024 at 3:20 PM.

## 2024-09-06 ENCOUNTER — Telehealth: Payer: Self-pay

## 2024-09-06 NOTE — Telephone Encounter (Signed)
-----   Message from [facility] [place] [facility] sent at 05/15/2024 12:32 PM EDT ----- Regarding: due for labs (4 months) CBC  and TIBC / ferritin panel due one day next week

## 2024-09-06 NOTE — Telephone Encounter (Signed)
 Called patient and left detailed message to go to the lab one day this week or Monday, Tuesday or Wednesday of next week.  Lab will be closed for Thanksgiving on Thurs and Friday.

## 2024-09-08 ENCOUNTER — Ambulatory Visit (HOSPITAL_COMMUNITY)
Admission: RE | Admit: 2024-09-08 | Discharge: 2024-09-08 | Disposition: A | Discharge status: Home / Self Care | Source: Ambulatory Visit | Attending: Physician Assistant | Admitting: Physician Assistant

## 2024-09-08 DIAGNOSIS — R0609 Other forms of dyspnea: Secondary | ICD-10-CM | POA: Diagnosis not present

## 2024-09-08 DIAGNOSIS — I10 Essential (primary) hypertension: Secondary | ICD-10-CM | POA: Insufficient documentation

## 2024-09-08 DIAGNOSIS — I471 Supraventricular tachycardia, unspecified: Secondary | ICD-10-CM | POA: Insufficient documentation

## 2024-09-08 DIAGNOSIS — D649 Anemia, unspecified: Secondary | ICD-10-CM | POA: Insufficient documentation

## 2024-09-08 DIAGNOSIS — E782 Mixed hyperlipidemia: Secondary | ICD-10-CM | POA: Diagnosis not present

## 2024-09-08 LAB — ECHOCARDIOGRAM COMPLETE
Area-P 1/2: 3.37 cm2
S' Lateral: 1.87 cm

## 2024-09-12 ENCOUNTER — Ambulatory Visit: Payer: Self-pay | Admitting: Physician Assistant

## 2024-09-17 DIAGNOSIS — F319 Bipolar disorder, unspecified: Secondary | ICD-10-CM | POA: Diagnosis not present

## 2024-09-17 DIAGNOSIS — E113392 Type 2 diabetes mellitus with moderate nonproliferative diabetic retinopathy without macular edema, left eye: Secondary | ICD-10-CM | POA: Diagnosis not present

## 2024-09-17 DIAGNOSIS — E209 Hypoparathyroidism, unspecified: Secondary | ICD-10-CM | POA: Diagnosis not present

## 2024-09-17 DIAGNOSIS — M810 Age-related osteoporosis without current pathological fracture: Secondary | ICD-10-CM | POA: Diagnosis not present

## 2024-09-17 DIAGNOSIS — I10 Essential (primary) hypertension: Secondary | ICD-10-CM | POA: Diagnosis not present

## 2024-09-18 ENCOUNTER — Ambulatory Visit: Attending: Otolaryngology

## 2024-09-18 NOTE — Telephone Encounter (Signed)
 Called patient and LM to go to the lab

## 2024-09-20 ENCOUNTER — Ambulatory Visit

## 2024-09-20 DIAGNOSIS — R490 Dysphonia: Secondary | ICD-10-CM

## 2024-09-20 NOTE — Therapy (Signed)
 [place] [place] [facility] [address] [place], [place], [address] Phone: [phone]   Fax:  [phone]  Patient Details  Name: Elizabeth Stone MRN: [id number] Date of Birth: [birth year] Referring Provider:  Okey Burns, MD  Encounter Date: 09/20/2024  ST - ARRIVE-CANCEL SLP took pt back to ST room for voice evaluation and learned pt was concerned about her speech clarity and not her vocal quality. Pt's script for glottic insufficiency, hoarseness, vocal fold atrophy will be cancelled. Pt authorized SLP to contact pt's PCP and request a script for dysarthria. Pt also told SLP she has fallen x3 in the last three weeks. SLP will also request PT script from pt's PCP.   [facility], CCC-SLP 09/20/2024, 5:32 PM  [facility] [address] [place], [place], [address] Phone: [phone]   Fax:  [phone]

## 2024-09-20 NOTE — Telephone Encounter (Signed)
 Pt never returned the call regarding appt with Dr Jeffrie for today.  Currently still scheduled to see Elizabeth Fabry, PA.  Will close this encounter and await a call back if pt has any further needs prior to her scheduled appt.

## 2024-09-21 ENCOUNTER — Other Ambulatory Visit (INDEPENDENT_AMBULATORY_CARE_PROVIDER_SITE_OTHER)

## 2024-09-21 DIAGNOSIS — K7460 Unspecified cirrhosis of liver: Secondary | ICD-10-CM | POA: Diagnosis not present

## 2024-09-21 LAB — PROTIME-INR
INR: 1.2 ratio — ABNORMAL HIGH (ref 0.8–1.0)
Prothrombin Time: 12.5 s (ref 9.6–13.1)

## 2024-09-23 LAB — ANTI-NUCLEAR AB-TITER (ANA TITER)
ANA TITER: 1:1280 {titer} — ABNORMAL HIGH
ANA Titer 1: 1:160 {titer} — ABNORMAL HIGH

## 2024-09-23 LAB — HEPATITIS C ANTIBODY: Hepatitis C Ab: NONREACTIVE

## 2024-09-23 LAB — ALPHA-1-ANTITRYPSIN: A-1 Antitrypsin, Ser: 182 mg/dL (ref 83–199)

## 2024-09-23 LAB — HEPATITIS B SURFACE ANTIBODY,QUALITATIVE: Hep B S Ab: REACTIVE — AB

## 2024-09-23 LAB — HEPATITIS A ANTIBODY, TOTAL: Hepatitis A AB,Total: REACTIVE — AB

## 2024-09-23 LAB — IGG: IgG (Immunoglobin G), Serum: 898 mg/dL (ref 600–1540)

## 2024-09-23 LAB — ANTI-SMOOTH MUSCLE ANTIBODY, IGG: Actin (Smooth Muscle) Antibody (IGG): <20 U (ref <20)

## 2024-09-23 LAB — ANA: Anti Nuclear Antibody (ANA): POSITIVE — AB

## 2024-09-23 LAB — MITOCHONDRIAL ANTIBODIES: Mitochondrial M2 Ab, IgG: <=20 U (ref <=20.0)

## 2024-09-23 LAB — HEPATITIS B SURFACE ANTIGEN: Hepatitis B Surface Ag: NONREACTIVE

## 2024-09-24 ENCOUNTER — Ambulatory Visit: Payer: Self-pay | Admitting: Gastroenterology

## 2024-09-24 DIAGNOSIS — K7460 Unspecified cirrhosis of liver: Secondary | ICD-10-CM

## 2024-09-25 NOTE — Telephone Encounter (Signed)
 Orders entered for RUQ U/S and LFTs and AFP.   RUQ U/S scheduled for Dec. 16.  Letter mailed to patient to have U/S and labs done before appointment

## 2024-09-25 NOTE — Telephone Encounter (Signed)
-----   Message from Elspeth SHAUNNA Naval sent at 09/24/2024  8:22 PM EST ----- Jan I believe this patient is seeing me in the office this month. I would like her to have LFTs and an AFP at some point prior to the appointment, and is also in need of a full RUQ US  - cirrhosis, rule out [place], if you can coordinate. If that can't be done prior to  her pending appointment that is okay. Thanks ----- Message ----- From: Interface, [facility] Sent: 09/21/2024   1:58 PM EST To: Elspeth SHAUNNA Naval, MD

## 2024-09-26 DIAGNOSIS — M961 Postlaminectomy syndrome, not elsewhere classified: Secondary | ICD-10-CM | POA: Diagnosis not present

## 2024-09-29 ENCOUNTER — Ambulatory Visit: Admitting: Physician Assistant

## 2024-10-03 ENCOUNTER — Ambulatory Visit (HOSPITAL_COMMUNITY)

## 2024-10-05 ENCOUNTER — Telehealth: Payer: Self-pay | Admitting: Gastroenterology

## 2024-10-05 NOTE — Telephone Encounter (Signed)
 Got it, okay.  Hopefully she can keep her appointment with me on December 23 next week to discuss the issues.  Thanks

## 2024-10-05 NOTE — Telephone Encounter (Signed)
 Inbound call from patient stating that she received a letter stating that she needing to come in the office to get more lab work done and patient stated that she is not able to able to do that because everyone is busy this time of year and she relays on transportation from others. Patient is requesting a call back to advise her further. Please advise.

## 2024-10-05 NOTE — Telephone Encounter (Signed)
 FYI- Patient has appointment 10/10/24. She was advised to have ultrasound 10/03/24 and labs before appointment. She cancelled ultrasound and does not plan to have labs as requested before visit.

## 2024-10-07 ENCOUNTER — Ambulatory Visit (HOSPITAL_COMMUNITY)
Admission: RE | Admit: 2024-10-07 | Discharge: 2024-10-07 | Disposition: A | Discharge status: Home / Self Care | Source: Ambulatory Visit | Attending: Gastroenterology | Admitting: Gastroenterology

## 2024-10-07 DIAGNOSIS — K7460 Unspecified cirrhosis of liver: Secondary | ICD-10-CM | POA: Insufficient documentation

## 2024-10-09 ENCOUNTER — Other Ambulatory Visit (INDEPENDENT_AMBULATORY_CARE_PROVIDER_SITE_OTHER)

## 2024-10-09 DIAGNOSIS — K7460 Unspecified cirrhosis of liver: Secondary | ICD-10-CM | POA: Diagnosis not present

## 2024-10-09 LAB — HEPATIC FUNCTION PANEL
ALT: 24 U/L (ref 3–35)
AST: 32 U/L (ref 5–37)
Albumin: 3.9 g/dL (ref 3.5–5.2)
Alkaline Phosphatase: 88 U/L (ref 39–117)
Bilirubin, Direct: 0.2 mg/dL (ref 0.1–0.3)
Total Bilirubin: 0.7 mg/dL (ref 0.2–1.2)
Total Protein: 6.7 g/dL (ref 6.0–8.3)

## 2024-10-10 ENCOUNTER — Ambulatory Visit: Admitting: Gastroenterology

## 2024-10-10 NOTE — Progress Notes (Deleted)
 "  HPI :  78 year old female here for follow-up visit for iron  deficiency as well as other symptoms she would like to discuss today.  Recall she is hearing impaired status post cochlear implant, Mnire's disease, diabetes, history of stroke.   She had an EGD and colonoscopy with me for IDA in February.  She had some mild gastritis noted, along with some AVMs in her upper tract.  Biopsies for H. pylori negative.  Colonoscopy showed 13 polyps, the majority were adenomas, fortunately all small.  She also incidentally was noted to have some mild ischemic colitis as outlined in the procedure report below.  She denies any pain around the time of the procedure problems with diarrhea at baseline or blood in her stool.   She reminds me she has had problems with chronic constipation for years.  She typically has 2 bowel movements per week, very hard stools.  She has not been taking any iron  supplementation due to her constipation and fears of worsening.  She has used some senna as needed but does not work well enough for her.  She states she does not like MiraLAX  as it created too loose of stool and she did not like that either.  She has not tried a fiber supplement.  She inquires about Linzess  which she has heard about and wants to try that.   She does take oxycodone  periodically for joint pains, usually once or twice per week at most.  She denies any NSAID use other than aspirin .  She takes Tylenol  only as needed and that typically works pretty well for her.   She denies any family history of colon cancer.  She did smoke tobacco in the past, quit in 1992.   We had increased her Nexium  from 20 mg daily to 40 mg daily.  She states that typically controls her reflux pretty well and has not had much breakthrough.  She has occasional nausea but no vomiting.  She does complain of a raspy voice and voice changes recently, as well as chronic cough.  She has never seen ENT.   Of note we did blood work while she has  not been on any iron  supplementation and her hemoglobin is normal, very mild iron  deficiency on labs   PAST GI PROCEDURES:   Colonoscopy 11/30/2017: - The examined portion of the ileum was normal. - One 7 mm polyp in the ascending colon, removed with a cold snare. Resected and retrieved.  - One 4 mm polyp at the recto-sigmoid colon, removed with a cold snare. Resected and retrieved.  - Tortuous colon.  - The examination was otherwise normal on direct and retroflexion views - 5 year recall - SESSILE SERRATED POLYP (X3 FRAGMENTS). - HYPERPLASTIC POLYP. - NO DYSPLASIA OR MALIGNANCY.    EGD 12/16/23: - Esophagogastric landmarks were identified: the Z-line was found at 37 cm, the gastroesophageal junction was found at 37 cm and the upper extent of the gastric folds was found at 38 cm from the incisors. Findings: - There were two benign gastric inlet patches in the proximal esophagus. The exam of the esophagus was otherwise normal. - A few angiodysplastic lesions with no bleeding were found in the cardia just inferior to the gastroesophageal junction, in the cardia and in the gastric fundus. - Mild inflammation characterized by adherent blood and erythema was found in the gastric fundus and in the gastric body. Biopsies were taken with a cold forceps for Helicobacter pylori testing. - The exam of the stomach was  otherwise normal. - Two small angiodysplastic lesions without bleeding were found in the second portion of the duodenum. - The exam of the duodenum was otherwise normal.   Colonoscopy 12/16/23: - The perianal and digital rectal examinations were normal. - The terminal ileum appeared normal. - Patchy mild inflammation characterized by erosions, erythema, friability and granularity was found in the transverse colon, in the proximal transverse colon, at the hepatic flexure, in the ascending colon and in the cecum. Biopsies were taken with a cold forceps for histology. - Seven sessile polyps were found  in the transverse colon. The polyps were 3 to 5 mm in size. These polyps were removed with a cold snare. Resection and retrieval were complete. - Three sessile polyps were found in the splenic flexure. The polyps were 3 to 4 mm in size. These polyps were removed with a cold snare. Resection and retrieval were complete. - Three sessile polyps were found in the sigmoid colon. The polyps were 3 mm in size. These polyps were removed with a cold snare. Resection and retrieval were complete. - Internal hemorrhoids were found during retroflexion. - The exam was otherwise without abnormality.   FINAL DIAGNOSIS        1. Surgical [P], gastric antrum and body :       REACTIVE GASTROPATHY AND MILD CHRONIC GASTRITIS       NEGATIVE FOR H. PYLORI, INTESTINAL METAPLASIA, DYSPLASIA AND CARCINOMA        2. Surgical [P], colon, transverse, splenic flex, sigmoid, polyp (13) :       TUBULAR ADENOMA, 15 FRAGMENTS       HYPERPLASTIC POLYP       NEGATIVE FOR HIGH-GRADE DYSPLASIA AND CARCINOMA        3. Surgical [P], colon, cecum and ascending bx :       COLONIC MUCOSA SHOWING MUCOSAL ISCHEMIC INJURY    78 y.o. female here for assessment of the following   1. Iron  deficiency anemia, unspecified iron  deficiency anemia type   2. Gastroesophageal reflux disease, unspecified whether esophagitis present   3. Long-term current use of proton pump inhibitor therapy   4. Chronic constipation   5. Change in voice   6. Chronic cough     Iron  deficiency could be in part due to AVMs and gastritis noted on EGD.  No concerning pathology on colonoscopy.  She is intolerant of oral iron  due to constipation.  Fortunately she does not have any anemia from this but was requesting an IV iron  infusion, potentially to help with her fatigue and ensure supplementated.  I think a dose of IV iron  is reasonable and to repeat her labs again in 3 months.  She does not have any high risk or concerning pathology.  If she responds to IV iron  I do  not feel that she needs a capsule endoscopy to further evaluate.   We otherwise reviewed several issues today.  She has chronic constipation, poorly controlled.  We discussed options for treatment.  Will give her some Linzess  samples at 72 mcg daily, she is worried about being too strong on regular dosing.  Will see how she does with this.  If she finds low-dose Linzess  too strong and MiraLAX , may opt to treat her with a fiber supplement although I am not sure if that will be strong enough.   We reviewed her reflux symptoms.  They seem well-controlled on Nexium  40 mg daily.  She has no Barrett's esophagus.  She does endorse some change in  voice and chronic cough, I am not convinced this is related to reflux - she has had no improvement in higher dosing of PPI.  I think reasonable to refer to ENT for their evaluation, she is a former smoker, ensure no concerning pathology there and she is agreeable to that.  Discussed long-term risk benefits of chronic PPI use, long-term use lowest dose needed of Nexium  to control her symptoms.   Otherwise reviewed colonoscopy findings.  She will need another colonoscopy in 1 year for surveillance of colon polyps given burden of polyps on her last exam.  Incidentally noted to have some mild ischemic colitis which could have been related to prep or perhaps recently constipation around the time? She was asymptomatic from this.   If she has any concerning symptoms she will let me know, otherwise I do not feel that she has Crohn's disease or chronic process causing that based on review of pathology results.      PLAN: - refer her for IV iron  at [facility]. Intolerant of oral iron  - repeat CBC and iron  studies in 3 months - continue nexium  40mg  once daily. Discussed risks / benefits of chronic PPI use - refer her to ENT at [facility] for evaluation of change in voice / chronic cough. Not convinced this is reflux related - trial of Linzess  / day -if she likes  this can give script - fiber supplementation trial if linzess  is too strong - repeat colonoscopy in one year from last exam for polyps - no symptoms of ischemic colitis, will contact me if she has any abdominal pain, rectal bleeding, etc    CT angio chest 08/08/24: IMPRESSION: 1. No evidence of pulmonary embolus. 2. No acute intrathoracic process. 3. Fluid-filled thoracic esophagus may reflect sequela of reflux. 4. Cirrhotic morphology of the liver. 5. Likely portal venous hypertension manifested by upper abdominal varices and splenomegaly.   RUQ 09/2024:    CT abdomen / pelvis 04/20/21: IMPRESSION: Slightly nodular hepatic contour is noted suggesting hepatic cirrhosis. No other abnormality seen in the abdomen or pelvis.      Past Medical History:  Diagnosis Date   Acute blood loss anemia    hx   Anxiety    Arthritis    Balance problem    Bipolar disorder (HCC)    Cochlear implant in place    Cochlear implant in place    right ear   Depression    Dermatitis    on face   Dyspnea    Fibromyalgia    GERD (gastroesophageal reflux disease)    Hard of hearing    Hearing aid worn    left ear   Hearing impaired    has cochlear implant   HTN (hypertension)    Hypokalemia    Meniere disease    takes HCTZ for her inner ear problem   Mixed hyperlipidemia 09/27/2013   Osteoporosis    Pneumonia 11/2016   Pre-diabetes    Primary localized osteoarthritis of left knee 11/16/2018   Primary localized osteoarthritis of right knee    Slow transit constipation    Unsteady gait      Past Surgical History:  Procedure Laterality Date   ABDOMINAL HYSTERECTOMY  1986   bladder tack  2007   COCHLEAR IMPLANT     2007   COLONOSCOPY     COLONOSCOPY     HAMMER TOE SURGERY  2014   left foot   REVERSE SHOULDER ARTHROPLASTY Right 12/13/2014   dr  chandler   REVERSE SHOULDER ARTHROPLASTY Right 12/13/2014   Procedure: REVERSE SHOULDER ARTHROPLASTY rt;  Surgeon: Eva Elsie Herring, MD;  Location: [facility] OR;  Service: Orthopedics;  Laterality: Right;  Right reverse total shoulder replacement   REVERSE SHOULDER ARTHROPLASTY Left 01/07/2017   Procedure: REVERSE LEFT TOTAL SHOULDER ARTHROPLASTY;  Surgeon: Eva Herring, MD;  Location: [facility] OR;  Service: Orthopedics;  Laterality: Left;  Left reveres total shoulder arthroplasty   TOTAL KNEE ARTHROPLASTY Right 12/16/2015   Procedure: TOTAL KNEE ARTHROPLASTY;  Surgeon: Lamar Millman, MD;  Location: [facility] OR;  Service: Orthopedics;  Laterality: Right;   TOTAL KNEE ARTHROPLASTY Left 11/28/2018   Procedure: LEFT TOTAL KNEE ARTHROPLASTY;  Surgeon: Millman Lamar, MD;  Location: [facility] OR;  Service: Orthopedics;  Laterality: Left;   Family History  Problem Relation Age of Onset   Heart disease Mother    Breast cancer Mother    Cervical cancer Mother    Diabetes Mother    Heart disease Father    Diabetes Maternal Aunt    Colon cancer Neg Hx    Esophageal cancer Neg Hx    Rectal cancer Neg Hx    Stomach cancer Neg Hx    Liver cancer Neg Hx    Pancreatic cancer Neg Hx    Social History[1] Current Outpatient Medications  Medication Sig Dispense Refill   acetaminophen  (TYLENOL ) 325 MG tablet Take 1 tablet (325 mg total) by mouth every 4 (four) hours as needed for mild pain (or temp > 37.5 C (99.5 F)).     albuterol  (VENTOLIN  HFA) 108 (90 Base) MCG/ACT inhaler Inhale 1-2 puffs into the lungs every 6 (six) hours as needed. 8 g 2   alendronate (FOSAMAX) 70 MG tablet Take 70 mg by mouth once a week.     Ascorbic Acid  (VITAMIN C  ER PO) Vitamin C      Asenapine  Maleate 10 MG SUBL Place 1 tablet under the tongue at bedtime.     aspirin  EC 81 MG tablet Take 1 tablet (81 mg total) by mouth daily. Swallow whole. 30 tablet 12   atorvastatin  (LIPITOR) 20 MG tablet Take 1 tablet (20 mg total) by mouth at bedtime. 30 tablet 0   B Complex-C (B-COMPLEX WITH VITAMIN C ) tablet Take 1 tablet by mouth daily. 30 tablet 0   buPROPion  (WELLBUTRIN  XL) 300  MG 24 hr tablet Take 1 tablet (300 mg total) by mouth daily. 30 tablet 0   cetirizine (ZYRTEC) 10 MG chewable tablet Chew 10 mg by mouth at bedtime.     clonazePAM  (KLONOPIN ) 1 MG tablet Take 1 tablet (1 mg total) by mouth at bedtime. 30 tablet 0   Dermatological Products, Misc. (DERMAREST ROSACEA EX)      DULoxetine  (CYMBALTA ) 60 MG capsule Take 1 capsule (60 mg total) by mouth 2 (two) times daily. 60 capsule 0   esomeprazole  (NEXIUM ) 40 MG capsule Take 1 capsule (40 mg total) by mouth daily.     FLUZONE HIGH-DOSE 0.5 ML injection      furosemide (LASIX) 20 MG tablet Take 20 mg by mouth daily.     gabapentin  (NEURONTIN ) 300 MG capsule Take 1 capsule (300 mg total) by mouth at bedtime. 30 capsule 0   HYDROcodone -acetaminophen  (NORCO) 10-325 MG tablet Take 1 tablet by mouth every 12 (twelve) hours as needed for severe pain. 30 tablet 0   lamoTRIgine  (LAMICTAL ) 150 MG tablet Take 2 tablets (300 mg total) by mouth daily. 60 tablet 0   LIDODERM  5 % SMARTSIG:1-3 Patch(s) Topical  Daily     losartan  (COZAAR ) 100 MG tablet Take 1 tablet (100 mg total) by mouth daily. 30 tablet 0   metoprolol  succinate (TOPROL -XL) 25 MG 24 hr tablet Take 1 tablet (25 mg total) by mouth daily. 90 tablet 3   metoprolol  tartrate (LOPRESSOR ) 25 MG tablet Take 0.5 tablets (12.5 mg total) by mouth daily as needed (palpitations or fast heart beat). 90 tablet 0   montelukast  (SINGULAIR ) 10 MG tablet Take 1 tablet (10 mg total) by mouth at bedtime. 30 tablet 0   potassium chloride  SA (KLOR-CON  M) 20 MEQ tablet Take 20 mEq by mouth daily.     tirzepatide  (MOUNJARO ) 2.5 MG/0.5ML Pen Inject 2.5 mg into the skin once a week. 2 mL 1   Vitamin D , Ergocalciferol , (DRISDOL ) 1.25 MG (50000 UNIT) CAPS capsule Take 1 capsule (50,000 Units total) by mouth every 7 (seven) days. 5 capsule 0   No current facility-administered medications for this visit.   Facility-Administered Medications Ordered in Other Visits  Medication Dose Route  Frequency Provider Last Rate Last Admin   sodium chloride  0.9 % bolus 250 mL  250 mL Intravenous Once Deajah Erkkila, Elspeth SQUIBB, MD       Allergies[2]   Review of Systems: All systems reviewed and negative except where noted in HPI.    No results found.  Physical Exam: LMP  (LMP Unknown)  Constitutional: Pleasant,well-developed, ***female in no acute distress. HEENT: Normocephalic and atraumatic. Conjunctivae are normal. No scleral icterus. Neck supple.  Cardiovascular: Normal rate, regular rhythm.  Pulmonary/chest: Effort normal and breath sounds normal. No wheezing, rales or rhonchi. Abdominal: Soft, nondistended, nontender. Bowel sounds active throughout. There are no masses palpable. No hepatomegaly. Extremities: no edema Lymphadenopathy: No cervical adenopathy noted. Neurological: Alert and oriented to person place and time. Skin: Skin is warm and dry. No rashes noted. Psychiatric: Normal mood and affect. Behavior is normal.   ASSESSMENT: 78 y.o. female here for assessment of the following  No diagnosis found.  PLAN:   Nanci Senior, MD    [1]  Social History Tobacco Use   Smoking status: Former    Current packs/day: 0.00    Average packs/day: 0.3 packs/day for 23.0 years (5.8 ttl pk-yrs)    Types: Cigarettes    Start date: 08/25/1970    Quit date: 08/25/1993    Years since quitting: 31.1   Smokeless tobacco: Never  Vaping Use   Vaping status: Never Used  Substance Use Topics   Alcohol use: Yes    Comment: 2 glasses of wine per night   Drug use: No  [2]  Allergies Allergen Reactions   Latex Hives and Rash    Tape   (No problems with gloves)   Adhesive [Tape] Rash and Other (See Comments)    PT STATED ALLERGY TO TAPE, NOT LATEX GLOVES AS PREVIOUSLY DOCUMENTED (Pt prefers paper tape.)   "

## 2024-10-11 LAB — AFP TUMOR MARKER: AFP-Tumor Marker: 5.9 ng/mL

## 2024-10-13 ENCOUNTER — Encounter: Payer: Self-pay | Admitting: Gastroenterology

## 2024-10-13 ENCOUNTER — Ambulatory Visit: Payer: Self-pay | Admitting: Gastroenterology

## 2024-10-13 ENCOUNTER — Ambulatory Visit: Admitting: Gastroenterology

## 2024-10-13 ENCOUNTER — Other Ambulatory Visit (INDEPENDENT_AMBULATORY_CARE_PROVIDER_SITE_OTHER)

## 2024-10-13 VITALS — BP 128/72 | HR 94 | Ht 61.0 in | Wt 175.2 lb

## 2024-10-13 DIAGNOSIS — D509 Iron deficiency anemia, unspecified: Secondary | ICD-10-CM

## 2024-10-13 DIAGNOSIS — F1090 Alcohol use, unspecified, uncomplicated: Secondary | ICD-10-CM | POA: Diagnosis not present

## 2024-10-13 DIAGNOSIS — Z860100 Personal history of colon polyps, unspecified: Secondary | ICD-10-CM

## 2024-10-13 DIAGNOSIS — R932 Abnormal findings on diagnostic imaging of liver and biliary tract: Secondary | ICD-10-CM | POA: Diagnosis not present

## 2024-10-13 DIAGNOSIS — K5909 Other constipation: Secondary | ICD-10-CM | POA: Diagnosis not present

## 2024-10-13 LAB — CBC WITH DIFFERENTIAL/PLATELET
Basophils Absolute: 0 K/uL (ref 0.0–0.1)
Basophils Relative: 0.7 % (ref 0.0–3.0)
Eosinophils Absolute: 0.1 K/uL (ref 0.0–0.7)
Eosinophils Relative: 1.9 % (ref 0.0–5.0)
HCT: 35.5 % — ABNORMAL LOW (ref 36.0–46.0)
Hemoglobin: 11.4 g/dL — ABNORMAL LOW (ref 12.0–15.0)
Lymphocytes Relative: 24.6 % (ref 12.0–46.0)
Lymphs Abs: 0.9 K/uL (ref 0.7–4.0)
MCHC: 32.1 g/dL (ref 30.0–36.0)
MCV: 79.3 fl (ref 78.0–100.0)
Monocytes Absolute: 0.3 K/uL (ref 0.1–1.0)
Monocytes Relative: 9.4 % (ref 3.0–12.0)
Neutro Abs: 2.2 K/uL (ref 1.4–7.7)
Neutrophils Relative %: 63.4 % (ref 43.0–77.0)
Platelets: 149 K/uL — ABNORMAL LOW (ref 150.0–400.0)
RBC: 4.48 Mil/uL (ref 3.87–5.11)
RDW: 19.4 % — ABNORMAL HIGH (ref 11.5–15.5)
WBC: 3.5 K/uL — ABNORMAL LOW (ref 4.0–10.5)

## 2024-10-13 LAB — IBC + FERRITIN
Ferritin: 29.3 ng/mL (ref 10.0–291.0)
Iron: 26 ug/dL — ABNORMAL LOW (ref 42–145)
Saturation Ratios: 6.1 % — ABNORMAL LOW (ref 20.0–50.0)
TIBC: 424.2 ug/dL (ref 250.0–450.0)
Transferrin: 303 mg/dL (ref 212.0–360.0)

## 2024-10-13 NOTE — Patient Instructions (Signed)
 _______________________________________________________  If your blood pressure at your visit was 140/90 or greater, please contact your primary care physician to follow up on this.  _______________________________________________________  If you are age 76 or older, your body mass index should be between 23-30. Your Body mass index is 33.11 kg/m. If this is out of the aforementioned range listed, please consider follow up with your Primary Care Provider.  If you are age 67 or younger, your body mass index should be between 19-25. Your Body mass index is 33.11 kg/m. If this is out of the aformentioned range listed, please consider follow up with your Primary Care Provider.   ________________________________________________________  The [facility] providers would like to encourage you to use MYCHART to communicate with providers for non-urgent requests or questions.  Due to long hold times on the telephone, sending your provider a message by [facility] may be a faster and more efficient way to get a response.  Please allow 48 business hours for a response.  Please remember that this is for non-urgent requests.  _______________________________________________________  [facility] is using a team-based approach to care.  Your team is made up of your doctor and two to three APPS. Our APPS (Nurse Practitioners and Physician Assistants) work with your physician to ensure care continuity for you. They are fully qualified to address your health concerns and develop a treatment plan. They communicate directly with your gastroenterologist to care for you. Seeing the Advanced Practice Practitioners on your physician's team can help you by facilitating care more promptly, often allowing for earlier appointments, access to diagnostic testing, procedures, and other specialty referrals.   Your provider has requested that you go to the basement level for lab work before leaving today. Press B on the  elevator. The lab is located at the first door on the left as you exit the elevator.  Repeat colonoscopy for 01-2025. Please call 2 months prior to schedule this. A letter will be sent as it gets closer.  Please purchase the following medications over the counter and take as directed: Senna 1 tablet daily  Stop all alcohol use  It was a pleasure to see you today!  Thank you for trusting me with your gastrointestinal care!

## 2024-10-13 NOTE — Progress Notes (Signed)
 "  HPI :  78 year old female here for a follow-up visit for IDA, also for abnormal liver imaging concerning for cirrhosis.  History of colon polyps. Recall she is hearing impaired status post cochlear implant, Mnire's disease, diabetes, history of stroke.   She is accompanied by her son today.  Recall that she had an EGD and colonoscopy with me for IDA in February of 2025.  Mild gastritis noted, along with some AVMs in her upper tract.  Biopsies for H. pylori negative.  Colonoscopy showed 13 polyps, the majority were adenomas, fortunately all small.  She also incidentally was noted to have some mild ischemic colitis as outlined in the procedure report below.  She denies any pain around the time of the procedure problems with diarrhea at baseline or blood in her stool.  She has a chronic constipation for years.  Recall we have tried her on a variety of regimens for her bowels in the past.  She does not like taking MiraLAX , states it makes her stools too loose.  We gave her some samples of Linzess  at 72 mcg dosing and she states it was far too strong and lead to accidents.  She has been using senna once as needed, states this can help but she does not take it routinely.  She is on chronic Mounjaro , also takes hydrocodone  as needed for pains although that is very rare.  She has tried magnesium  supplementation in the past which she states can also be too strong for her.  She had received IV iron .  She had deficiency on her iron  studies earlier this year.  Status post IV iron  (she is intolerant to oral iron ), her iron  studies were normal in July with normal hemoglobin.  She denies any blood in her stools. She denies any NSAID use other than aspirin .  She takes Tylenol  only as needed and that typically works pretty well for her.   Of note she was in the emergency department in October with symptoms of an SVT.  She underwent a CTA of the chest to further evaluate this in the ED.  There was no evidence of  pulmonary embolism or acute thoracic process, however incidentally reported to have cirrhosis of the liver with suspicion of portal hypertension changes, varices and splenomegaly.  In looking through prior imaging, she had a CT chest back in February 2024 which suggested cirrhosis.  Back in 2022 she had 3 CT scans of her abdomen and pelvis for a variety of reasons.  1 of these studies suggested cirrhosis, another suggested fatty liver, and another showed a normal liver without any concerning findings at all.  Her LFTs in recent years have been normal.  She had a mild elevation in 2020 and 2021, before and after that her liver enzymes were normal.  Platelet count has historically been normal.  She called in about these changes and I did a serologic workup to evaluate for chronic liver diseases.  She had a positive ANA but smooth muscle antibody and IgG are normal.  She is immune to hepatitis A and B.  She states her grandfather had cirrhosis due to alcohol use.  She does endorse drinking alcohol routinely since age 57s.  She tells me she drinks about a shot of vodka per day however her son states it is about 8 ounces of vodka per day, ongoing for years.  She states in the past it was more alcohol than that.  She does have steatosis noted on remote imaging.  Body  mass index is 33.  Her EGD in February showed no varices.  She has never had jaundice, encephalopathy, ascites etc.  She had a right upper quadrant ultrasound done on December 20 to look at her liver and full.  aFP normal.  The ultrasound report was pending at the time of her visit but has just been released since she left the office and there are changes consistent with cirrhosis on this exam    PAST GI PROCEDURES:   Colonoscopy 11/30/2017: - The examined portion of the ileum was normal. - One 7 mm polyp in the ascending colon, removed with a cold snare. Resected and retrieved.  - One 4 mm polyp at the recto-sigmoid colon, removed with a cold  snare. Resected and retrieved.  - Tortuous colon.  - The examination was otherwise normal on direct and retroflexion views - 5 year recall - SESSILE SERRATED POLYP (X3 FRAGMENTS). - HYPERPLASTIC POLYP. - NO DYSPLASIA OR MALIGNANCY.    EGD 12/16/23: - Esophagogastric landmarks were identified: the Z-line was found at 37 cm, the gastroesophageal junction was found at 37 cm and the upper extent of the gastric folds was found at 38 cm from the incisors. Findings: - There were two benign gastric inlet patches in the proximal esophagus. The exam of the esophagus was otherwise normal. - A few angiodysplastic lesions with no bleeding were found in the cardia just inferior to the gastroesophageal junction, in the cardia and in the gastric fundus. - Mild inflammation characterized by adherent blood and erythema was found in the gastric fundus and in the gastric body. Biopsies were taken with a cold forceps for Helicobacter pylori testing. - The exam of the stomach was otherwise normal. - Two small angiodysplastic lesions without bleeding were found in the second portion of the duodenum. - The exam of the duodenum was otherwise normal.   Colonoscopy 12/16/23: - The perianal and digital rectal examinations were normal. - The terminal ileum appeared normal. - Patchy mild inflammation characterized by erosions, erythema, friability and granularity was found in the transverse colon, in the proximal transverse colon, at the hepatic flexure, in the ascending colon and in the cecum. Biopsies were taken with a cold forceps for histology. - Seven sessile polyps were found in the transverse colon. The polyps were 3 to 5 mm in size. These polyps were removed with a cold snare. Resection and retrieval were complete. - Three sessile polyps were found in the splenic flexure. The polyps were 3 to 4 mm in size. These polyps were removed with a cold snare. Resection and retrieval were complete. - Three sessile polyps were found in  the sigmoid colon. The polyps were 3 mm in size. These polyps were removed with a cold snare. Resection and retrieval were complete. - Internal hemorrhoids were found during retroflexion. - The exam was otherwise without abnormality.   FINAL DIAGNOSIS        1. Surgical [P], gastric antrum and body :       REACTIVE GASTROPATHY AND MILD CHRONIC GASTRITIS       NEGATIVE FOR H. PYLORI, INTESTINAL METAPLASIA, DYSPLASIA AND CARCINOMA        2. Surgical [P], colon, transverse, splenic flex, sigmoid, polyp (13) :       TUBULAR ADENOMA, 15 FRAGMENTS       HYPERPLASTIC POLYP       NEGATIVE FOR HIGH-GRADE DYSPLASIA AND CARCINOMA        3. Surgical [P], colon, cecum and ascending bx :  COLONIC MUCOSA SHOWING MUCOSAL ISCHEMIC INJURY   CTA 08/08/24: IMPRESSION: 1. No evidence of pulmonary embolus. 2. No acute intrathoracic process. 3. Fluid-filled thoracic esophagus may reflect sequela of reflux. 4. Cirrhotic morphology of the liver. 5. Likely portal venous hypertension manifested by upper abdominal varices and splenomegaly.   CT abdomen / pelvis 04/20/21: IMPRESSION: Slightly nodular hepatic contour is noted suggesting hepatic cirrhosis. No other abnormality seen in the abdomen or pelvis.   CT abdomen / pelvis 04/07/21: FATTY LIVER WITHOUT CIRRHOSIS  CT abdomen pelvis 12/11/20: NORMAL LIVER    Past Medical History:  Diagnosis Date   Acute blood loss anemia    hx   Anxiety    Arthritis    Balance problem    Bipolar disorder (HCC)    Cochlear implant in place    Cochlear implant in place    right ear   Depression    Dermatitis    on face   Dyspnea    Fibromyalgia    GERD (gastroesophageal reflux disease)    Hard of hearing    Hearing aid worn    left ear   Hearing impaired    has cochlear implant   HTN (hypertension)    Hypokalemia    Meniere disease    takes HCTZ for her inner ear problem   Mixed hyperlipidemia 09/27/2013   Osteoporosis    Pneumonia 11/2016    Pre-diabetes    Primary localized osteoarthritis of left knee 11/16/2018   Primary localized osteoarthritis of right knee    Slow transit constipation    Unsteady gait      Past Surgical History:  Procedure Laterality Date   ABDOMINAL HYSTERECTOMY  1986   bladder tack  2007   COCHLEAR IMPLANT     2007   COLONOSCOPY     COLONOSCOPY     HAMMER TOE SURGERY  2014   left foot   REVERSE SHOULDER ARTHROPLASTY Right 12/13/2014   dr dozier   REVERSE SHOULDER ARTHROPLASTY Right 12/13/2014   Procedure: REVERSE SHOULDER ARTHROPLASTY rt;  Surgeon: Eva Elsie Dozier, MD;  [facility] OR;  Service: Orthopedics;  Laterality: Right;  Right reverse total shoulder replacement   REVERSE SHOULDER ARTHROPLASTY Left 01/07/2017   Procedure: REVERSE LEFT TOTAL SHOULDER ARTHROPLASTY;  Surgeon: Eva Dozier, MD;  Location: [facility] OR;  Service: Orthopedics;  Laterality: Left;  Left reveres total shoulder arthroplasty   SPINAL CORD STIMULATOR INSERTION  2025   TOTAL KNEE ARTHROPLASTY Right 12/16/2015   Procedure: TOTAL KNEE ARTHROPLASTY;  Surgeon: Lamar Millman, MD;  Location: [facility] OR;  Service: Orthopedics;  Laterality: Right;   TOTAL KNEE ARTHROPLASTY Left 11/28/2018   Procedure: LEFT TOTAL KNEE ARTHROPLASTY;  Surgeon: Millman Lamar, MD;  Location: [facility] OR;  Service: Orthopedics;  Laterality: Left;   Family History  Problem Relation Age of Onset   Heart disease Mother    Breast cancer Mother    Cervical cancer Mother    Diabetes Mother    Heart disease Father    Diabetes Maternal Aunt    Colon cancer Neg Hx    Esophageal cancer Neg Hx    Rectal cancer Neg Hx    Stomach cancer Neg Hx    Liver cancer Neg Hx    Pancreatic cancer Neg Hx    Social History[1] Current Outpatient Medications  Medication Sig Dispense Refill   acetaminophen  (TYLENOL ) 325 MG tablet Take 1 tablet (325 mg total) by mouth every 4 (four) hours as needed for mild pain (or temp > 37.5  C (99.5 F)).     albuterol  (VENTOLIN   HFA) 108 (90 Base) MCG/ACT inhaler Inhale 1-2 puffs into the lungs every 6 (six) hours as needed. 8 g 2   alendronate (FOSAMAX) 70 MG tablet Take 70 mg by mouth once a week.     Ascorbic Acid  (VITAMIN C  ER PO) Vitamin C      Asenapine  Maleate 10 MG SUBL Place 1 tablet under the tongue at bedtime.     aspirin  EC 81 MG tablet Take 1 tablet (81 mg total) by mouth daily. Swallow whole. 30 tablet 12   atorvastatin  (LIPITOR) 20 MG tablet Take 1 tablet (20 mg total) by mouth at bedtime. 30 tablet 0   B Complex-C (B-COMPLEX WITH VITAMIN C ) tablet Take 1 tablet by mouth daily. 30 tablet 0   buPROPion  (WELLBUTRIN  XL) 300 MG 24 hr tablet Take 1 tablet (300 mg total) by mouth daily. 30 tablet 0   cetirizine (ZYRTEC) 10 MG chewable tablet Chew 10 mg by mouth at bedtime.     clonazePAM  (KLONOPIN ) 1 MG tablet Take 1 tablet (1 mg total) by mouth at bedtime. 30 tablet 0   Dermatological Products, Misc. (DERMAREST ROSACEA EX)      DULoxetine  (CYMBALTA ) 60 MG capsule Take 1 capsule (60 mg total) by mouth 2 (two) times daily. 60 capsule 0   esomeprazole  (NEXIUM ) 40 MG capsule Take 1 capsule (40 mg total) by mouth daily.     FLUZONE HIGH-DOSE 0.5 ML injection      furosemide (LASIX) 20 MG tablet Take 20 mg by mouth daily.     gabapentin  (NEURONTIN ) 300 MG capsule Take 1 capsule (300 mg total) by mouth at bedtime. 30 capsule 0   HYDROcodone -acetaminophen  (NORCO) 10-325 MG tablet Take 1 tablet by mouth every 12 (twelve) hours as needed for severe pain. 30 tablet 0   lamoTRIgine  (LAMICTAL ) 150 MG tablet Take 2 tablets (300 mg total) by mouth daily. 60 tablet 0   losartan  (COZAAR ) 100 MG tablet Take 1 tablet (100 mg total) by mouth daily. 30 tablet 0   metoprolol  succinate (TOPROL -XL) 25 MG 24 hr tablet Take 1 tablet (25 mg total) by mouth daily. 90 tablet 3   metoprolol  tartrate (LOPRESSOR ) 25 MG tablet Take 0.5 tablets (12.5 mg total) by mouth daily as needed (palpitations or fast heart beat). 90 tablet 0   montelukast   (SINGULAIR ) 10 MG tablet Take 1 tablet (10 mg total) by mouth at bedtime. 30 tablet 0   potassium chloride  SA (KLOR-CON  M) 20 MEQ tablet Take 20 mEq by mouth daily.     tirzepatide  (MOUNJARO ) 2.5 MG/0.5ML Pen Inject 2.5 mg into the skin once a week. 2 mL 1   Vitamin D , Ergocalciferol , (DRISDOL ) 1.25 MG (50000 UNIT) CAPS capsule Take 1 capsule (50,000 Units total) by mouth every 7 (seven) days. 5 capsule 0   No current facility-administered medications for this visit.   Facility-Administered Medications Ordered in Other Visits  Medication Dose Route Frequency Provider Last Rate Last Admin   sodium chloride  0.9 % bolus 250 mL  250 mL Intravenous Once Omara Alcon, Elspeth SQUIBB, MD       Allergies[2]   Review of Systems: All systems reviewed and negative except where noted in HPI.   Lab Results  Component Value Date   WBC 7.8 08/08/2024   HGB 12.6 08/08/2024   HCT 37.0 08/08/2024   MCV 101.1 (H) 08/08/2024   PLT 191 08/08/2024    Lab Results  Component Value Date   NA  139 08/08/2024   CL 101 08/08/2024   K 3.5 08/08/2024   CO2 23 08/08/2024   BUN 10 08/08/2024   CREATININE 0.70 08/08/2024   GFRNONAA >60 08/08/2024   CALCIUM  9.3 08/08/2024   ALBUMIN 3.9 10/09/2024   GLUCOSE 130 (H) 08/08/2024    Lab Results  Component Value Date   ALT 24 10/09/2024   AST 32 10/09/2024   ALKPHOS 88 10/09/2024   BILITOT 0.7 10/09/2024    Lab Results  Component Value Date   INR 1.2 (H) 09/21/2024   INR 1.1 10/07/2022   INR 1.14 11/21/2018   Physical Exam: BP 128/72   Pulse 94   Ht 5' 1 (1.549 m)   Wt 175 lb 4 oz (79.5 kg)   LMP  (LMP Unknown)   BMI 33.11 kg/m  Constitutional: Pleasant,well-developed, female in no acute distress. Neurological: Alert and oriented to person place and time. Psychiatric: Normal mood and affect. Behavior is normal.   ASSESSMENT: 78 y.o. female here for assessment of the following  1. Iron  deficiency anemia, unspecified iron  deficiency anemia type    2. Abnormal liver diagnostic imaging   3. Alcohol use   4. Chronic constipation   5. History of colon polyps    Reviewed history of iron  deficiency anemia.  She responded appropriately to IV iron , she does not tolerate oral iron  due to constipation.  EGD and colonoscopy done, findings could be due to AVMs noted in her stomach.  Will repeat CBC and iron  studies, give for IV iron  if needed.  If we need to ablate AVMs at some point we can.  Majority of the visit discussing her liver imaging.  CT chest most recently done does show concerning changes of cirrhosis of the liver.  Historically her LAE's and platelet count have been normal.  She had no varices on recent EGD.  We discussed possibility of underlying cirrhosis.  We discussed what this is, risks for decompensation and HCC over time.  Her serologic workup for liver disease is negative other than an isolated ANA (for which she should see her PCP), however I do not think she has autoimmune hepatitis.  She is immune to hepatitis A and B.  She drinks enough alcohol on a daily basis for this to be consistent with alcoholic cirrhosis.  She has a family history of this.  Given the findings today do recommend she completely abstain from alcohol.  We discussed this a bit and she seems agreeable.  At the time of her visit her ultrasound was pending and I wanted to have that back to clarify.  If there is discrepancy in imaging we could consider FibroScan or liver biopsy.  The ultrasound does support floor changes of cirrhosis and I do think she likely has this.  No hepatomas noted.  Moving forward, recommend right upper quadrant ultrasound every 6 months as well as labs for [facility] screening and assessment of her liver function.  She is compensated at this time and will keep a close eye on her moving forward.  Hopefully she can stop alcohol on her own but if she needs assistance she should let us  know.  Patient and son understand the issues at play here and the  plan.  Otherwise, chronic constipation, she is very sensitive to laxatives in the past which can lead to loose stools and diarrhea.  She prefers to take senna, recommend she take this more frequently.  She is due for surveillance colonoscopy in February for history of colon polyps.  She is getting a spinal stimulator placed in the next few months and wants more time to recover, will try to do this sometime in the spring.   PLAN: - CBC and TIBC / ferritin today - will relay US  results (finalized result came after her visit) - c/w cirrhosis - counseled on likelihood of cirrhosis, risks for decompensation and HCC - stop all EtOH - counseled on this - repeat RUQ US  and labs / AFP in 6 months - EGD UTD, no varices - take senna daily - she declines Miralax  / Linzess  or other - colonoscopy in April or so - getting spinal stimulator place and needs more time to recover  Marcey Naval, MD [facility]    [1]  Social History Tobacco Use   Smoking status: Former    Current packs/day: 0.00    Average packs/day: 0.3 packs/day for 23.0 years (5.8 ttl pk-yrs)    Types: Cigarettes    Start date: 08/25/1970    Quit date: 08/25/1993    Years since quitting: 31.1   Smokeless tobacco: Never  Vaping Use   Vaping status: Never Used  Substance Use Topics   Alcohol use: Yes    Comment: 2 glasses of wine per night   Drug use: No  [2]  Allergies Allergen Reactions   Latex Hives and Rash    Tape   (No problems with gloves)   Adhesive [Tape] Rash and Other (See Comments)    PT STATED ALLERGY TO TAPE, NOT LATEX GLOVES AS PREVIOUSLY DOCUMENTED (Pt prefers paper tape.)   "

## 2024-10-17 ENCOUNTER — Ambulatory Visit (INDEPENDENT_AMBULATORY_CARE_PROVIDER_SITE_OTHER): Admitting: Pulmonary Disease

## 2024-10-17 ENCOUNTER — Encounter (HOSPITAL_BASED_OUTPATIENT_CLINIC_OR_DEPARTMENT_OTHER): Payer: Self-pay | Admitting: Pulmonary Disease

## 2024-10-17 VITALS — BP 138/54 | HR 88 | Temp 99.0°F | Ht 61.0 in | Wt 160.0 lb

## 2024-10-17 DIAGNOSIS — M25749 Osteophyte, unspecified hand: Secondary | ICD-10-CM

## 2024-10-17 DIAGNOSIS — J449 Chronic obstructive pulmonary disease, unspecified: Secondary | ICD-10-CM

## 2024-10-17 DIAGNOSIS — Z87891 Personal history of nicotine dependence: Secondary | ICD-10-CM | POA: Diagnosis not present

## 2024-10-17 DIAGNOSIS — M65949 Unspecified synovitis and tenosynovitis, unspecified hand: Secondary | ICD-10-CM

## 2024-10-17 DIAGNOSIS — M1990 Unspecified osteoarthritis, unspecified site: Secondary | ICD-10-CM

## 2024-10-17 MED ORDER — ARFORMOTEROL TARTRATE 15 MCG/2ML IN NEBU: 15.0000 ug | INHALATION_SOLUTION | Freq: Two times a day (BID) | RESPIRATORY_TRACT | 6 refills | Status: AC

## 2024-10-17 MED ORDER — ALBUTEROL SULFATE 1.25 MG/3ML IN NEBU: 1.0000 | INHALATION_SOLUTION | Freq: Four times a day (QID) | RESPIRATORY_TRACT | 12 refills | Status: AC | PRN

## 2024-10-17 MED ORDER — YUPELRI 175 MCG/3ML IN SOLN: 175.0000 ug | Freq: Every day | RESPIRATORY_TRACT | 6 refills | Status: AC

## 2024-10-17 NOTE — Patient Instructions (Signed)
" °  VISIT SUMMARY: During your visit, we discussed your chronic obstructive pulmonary disease (COPD) and the challenges you face with shortness of breath and using your inhaler. We also reviewed your history of anemia and recent treatments.  YOUR PLAN: CHRONIC OBSTRUCTIVE PULMONARY DISEASE (COPD): You have COPD with symptoms like shortness of breath, wheezing, and chest tightness. You have difficulty using your inhaler due to arthritis in your hands. -We have prescribed a nebulizer machine for you to use at home. -Start using Yupelri nebulizer treatment once daily. -Start using Brovana nebulizer treatment twice daily. -Continue using your albuterol  inhaler as needed for shortness of breath. -We have ordered a pulmonary function test to reassess your lung function. -We will schedule a follow-up appointment in a few months to evaluate how well the treatment is working.  ANEMIA: Your hemoglobin level was 11 at the last check, and you have previously received iron  infusions. -Continue monitoring your hemoglobin levels.   Contains text generated by [employer].  "

## 2024-10-17 NOTE — Progress Notes (Signed)
 "  Established Patient Pulmonology Office Visit   Subjective:  Patient ID: Elizabeth Stone, female    DOB: Jun 22, 1946  MRN: [id number]  CC:  Chief Complaint  Patient presents with   Shortness of Breath    HPI  [name] is a 78 y/o F with hx of DM, BPD, chronic pain, spinal stenosis, former smoker (15 PY), and reactive airways disease who presents for follow up.  She was last seen by Tammy NP on 11/2022, at the time, she was given albuterol  2 puffs every 6 hours as needed and CXR was ordered to evaluate dyspnea. Dr. Shelah saw her in 2021 and thought her dyspnea was due to deconditioning.   Discussed the use of AI scribe software for clinical note transcription with the patient, who gave verbal consent to proceed.  History of Present Illness Elizabeth Stone is a 78 year old female with COPD who presents with shortness of breath.  She experiences significant shortness of breath both at rest and with exertion, particularly after showering and when bending over. She uses albuterol  inhalers as needed, especially before showers, but is unsure of its effectiveness. Due to arthritis in her hands, she has difficulty using a spacer, which prevents her from effectively using the inhaler. She has a history of smoking, having quit in 1992, and reports exposure to secondhand smoke from her sister. No current mold issues at home, but she has a cat that sometimes causes sneezing when it jumps on her lap. She also has a history of pneumonia and reports episodes of wheezing.  Her osteoarthritis affects her hands, causing stiffness and swelling, particularly in the morning. Exercise helps alleviate the stiffness. She has not seen a rheumatologist since the 1990s and was previously told she did not need to return. She experiences significant swelling in her legs and takes a diuretic for this.  She experienced a stroke in 2023, which has affected her speech. She began speech therapy but has not continued due to lack of  follow-up. She has not seen a neurologist recently but did after her stroke, and was told she did not need further follow-up. She also reports a history of supraventricular tachycardia and occasional chest tightness.  She has a history of anemia and received iron  infusions in the spring. Her hemoglobin was noted to be 11 at the last check. She has received her flu and pneumonia vaccinations.  CAT > 10, MMRC > 2, no exacerbations  ROS   Current Medications[1]      Objective:  BP (!) 138/54 (BP Location: Left Arm, Patient Position: Sitting)   Pulse 88   Temp 99 F (37.2 C)   Ht 5' 1 (1.549 m)   Wt 160 lb (72.6 kg)   LMP  (LMP Unknown)   SpO2 94%   BMI 30.23 kg/m  Wt Readings from Last 3 Encounters:  10/17/24 160 lb (72.6 kg)  10/13/24 175 lb 4 oz (79.5 kg)  08/17/24 179 lb (81.2 kg)   BMI Readings from Last 3 Encounters:  10/17/24 30.23 kg/m  10/13/24 33.11 kg/m  08/17/24 33.82 kg/m   SpO2 Readings from Last 3 Encounters:  10/17/24 94%  08/17/24 94%  08/09/24 96%   Physical Exam General: NAD, alert, WD, WN Eyes: PERRL, no scleral icterus ENMT: oropharynx clear, good dentition, no oral lesions, mallampati score III Skin: warm, intact, no rashes Neck: JVD flat, ROM and lymph node assessment normal CV: RRR, systolic murmur, nl S1 and S2, trace peripheral edema Resp: expiratory  wheezing scattered throughout her lung fields Neuro: Awake alert oriented to person place time and situation  Diagnostic Review:  Last CBC Lab Results  Component Value Date   WBC 3.5 (L) 10/13/2024   HGB 11.4 (L) 10/13/2024   HCT 35.5 (L) 10/13/2024   MCV 79.3 10/13/2024   MCH 31.5 08/08/2024   RDW 19.4 (H) 10/13/2024   PLT 149.0 (L) 10/13/2024   Last metabolic panel Lab Results  Component Value Date   GLUCOSE 130 (H) 08/08/2024   NA 139 08/08/2024   K 3.5 08/08/2024   CL 101 08/08/2024   CO2 23 08/08/2024   BUN 10 08/08/2024   CREATININE 0.70 08/08/2024   GFRNONAA >60  08/08/2024   CALCIUM  9.3 08/08/2024   PROT 6.7 10/09/2024   ALBUMIN 3.9 10/09/2024   BILITOT 0.7 10/09/2024   ALKPHOS 88 10/09/2024   AST 32 10/09/2024   ALT 24 10/09/2024   ANIONGAP 10 08/08/2024    PFT 2021: no obstruction, TLC and RV > 120% suggestive of hyperinflation, normal DLCO  CTA Chest 07/2024: mosaic attenuation in all lobes on my independent review. IMPRESSION: 1. No evidence of pulmonary embolus. 2. No acute intrathoracic process. 3. Fluid-filled thoracic esophagus may reflect sequela of reflux. 4. Cirrhotic morphology of the liver. 5. Likely portal venous hypertension manifested by upper abdominal varices and splenomegaly.  Echo 09/08/2024: IMPRESSIONS   1. Left ventricular ejection fraction, by estimation, is 60 to 65%. Left  ventricular ejection fraction by 3D volume is 67 %. The left ventricle has  normal function. The left ventricle has no regional wall motion  abnormalities. Left ventricular diastolic   parameters were normal.   2. Right ventricular systolic function is normal. The right ventricular  size is normal.   3. The mitral valve is grossly normal. No evidence of mitral valve  regurgitation. No evidence of mitral stenosis.   4. The aortic valve is tricuspid. Aortic valve regurgitation is not  visualized. No aortic stenosis is present.   5. The inferior vena cava is normal in size with greater than 50%  respiratory variability, suggesting right atrial pressure of 3 mmHg.     Assessment & Plan:   Assessment & Plan Chronic obstructive pulmonary disease, unspecified COPD type (HCC) Chronic obstructive pulmonary disease CTA Chest shows evidence of mosaic attenuation which is concerning for airways disease. PFTs in 2021 shows evidence of hyperinflation with elevated RV and TLC. The patient has an extensive smoking hx and quit in 1992. Dyspnea at rest and exertion, wheezing, chest tightness. Difficulty using inhaler due to severe arthritis in her hands.  Anemia with hemoglobin at 11, previously treated with iron  infusions. MMRC > 2, CAT > 10, no exacerbations. Likely grade I, Class B. Will start LAMA/LABA via nebulizer given significant arthritis of hands with limitations on use of inhalers. - Prescribed nebulizer machine for home use. GLENWOOD Reeda Stakes nebulizer treatment once daily. - Initiated Brovana nebulizer treatment twice daily. - Continue albuterol  inhaler as needed for dyspnea. - Ordered pulmonary function test to reassess lung function. - Scheduled follow-up appointment in a few months to evaluate treatment efficacy. Arthritis The patient has evidence of synovitis in MCPs and PIPs. She does also have osteophytes in DIPs. I suspect she has RA and OA. She has mild ulnar deviation. The digits of the hands are swollen, erythematous, and warm. The patient has morning stiffness and improvement in symptoms with motion. She has not seen rheumatology since the 90s. I provided referral to assess for RA. If  these symptoms improve, she maybe considered for inhalers in the future.  Orders Placed This Encounter  Procedures   AMB REFERRAL FOR DME   Ambulatory referral to Rheumatology   Pulmonary function test   I spent 30 minutes reviewing patient's chart including prior consultant notes, imaging, and PFTs as well as face-to-face with the patient, over half in discussion of the diagnosis and the importance of compliance with the treatment plan.  Return in about 3 months (around 01/15/2025).   Georgios Kina, MD     [1]  Current Outpatient Medications:    acetaminophen  (TYLENOL ) 325 MG tablet, Take 1 tablet (325 mg total) by mouth every 4 (four) hours as needed for mild pain (or temp > 37.5 C (99.5 F))., Disp: , Rfl:    albuterol  (ACCUNEB ) 1.25 MG/3ML nebulizer solution, Take 3 mLs (1.25 mg total) by nebulization every 6 (six) hours as needed for wheezing., Disp: 75 mL, Rfl: 12   albuterol  (VENTOLIN  HFA) 108 (90 Base) MCG/ACT inhaler, Inhale  1-2 puffs into the lungs every 6 (six) hours as needed., Disp: 8 g, Rfl: 2   alendronate (FOSAMAX) 70 MG tablet, Take 70 mg by mouth once a week., Disp: , Rfl:    arformoterol (BROVANA) 15 MCG/2ML NEBU, Take 2 mLs (15 mcg total) by nebulization 2 (two) times daily., Disp: 120 mL, Rfl: 6   Ascorbic Acid  (VITAMIN C  ER PO), Vitamin C , Disp: , Rfl:    Asenapine  Maleate 10 MG SUBL, Place 1 tablet under the tongue at bedtime., Disp: , Rfl:    aspirin  EC 81 MG tablet, Take 1 tablet (81 mg total) by mouth daily. Swallow whole., Disp: 30 tablet, Rfl: 12   atorvastatin  (LIPITOR) 20 MG tablet, Take 1 tablet (20 mg total) by mouth at bedtime., Disp: 30 tablet, Rfl: 0   B Complex-C (B-COMPLEX WITH VITAMIN C ) tablet, Take 1 tablet by mouth daily., Disp: 30 tablet, Rfl: 0   buPROPion  (WELLBUTRIN  XL) 300 MG 24 hr tablet, Take 1 tablet (300 mg total) by mouth daily., Disp: 30 tablet, Rfl: 0   cetirizine (ZYRTEC) 10 MG chewable tablet, Chew 10 mg by mouth at bedtime., Disp: , Rfl:    clonazePAM  (KLONOPIN ) 1 MG tablet, Take 1 tablet (1 mg total) by mouth at bedtime., Disp: 30 tablet, Rfl: 0   Dermatological Products, Misc. (DERMAREST ROSACEA EX), , Disp: , Rfl:    DULoxetine  (CYMBALTA ) 60 MG capsule, Take 1 capsule (60 mg total) by mouth 2 (two) times daily., Disp: 60 capsule, Rfl: 0   esomeprazole  (NEXIUM ) 40 MG capsule, Take 1 capsule (40 mg total) by mouth daily., Disp: , Rfl:    FLUZONE HIGH-DOSE 0.5 ML injection, , Disp: , Rfl:    furosemide (LASIX) 20 MG tablet, Take 20 mg by mouth daily., Disp: , Rfl:    gabapentin  (NEURONTIN ) 300 MG capsule, Take 1 capsule (300 mg total) by mouth at bedtime., Disp: 30 capsule, Rfl: 0   HYDROcodone -acetaminophen  (NORCO) 10-325 MG tablet, Take 1 tablet by mouth every 12 (twelve) hours as needed for severe pain., Disp: 30 tablet, Rfl: 0   lamoTRIgine  (LAMICTAL ) 150 MG tablet, Take 2 tablets (300 mg total) by mouth daily., Disp: 60 tablet, Rfl: 0   losartan  (COZAAR ) 100 MG  tablet, Take 1 tablet (100 mg total) by mouth daily., Disp: 30 tablet, Rfl: 0   metoprolol  succinate (TOPROL -XL) 25 MG 24 hr tablet, Take 1 tablet (25 mg total) by mouth daily., Disp: 90 tablet, Rfl: 3   metoprolol  tartrate (  LOPRESSOR ) 25 MG tablet, Take 0.5 tablets (12.5 mg total) by mouth daily as needed (palpitations or fast heart beat)., Disp: 90 tablet, Rfl: 0   montelukast  (SINGULAIR ) 10 MG tablet, Take 1 tablet (10 mg total) by mouth at bedtime., Disp: 30 tablet, Rfl: 0   potassium chloride  SA (KLOR-CON  M) 20 MEQ tablet, Take 20 mEq by mouth daily., Disp: , Rfl:    revefenacin (YUPELRI) 175 MCG/3ML nebulizer solution, Take 3 mLs (175 mcg total) by nebulization daily., Disp: 90 mL, Rfl: 6   tirzepatide  (MOUNJARO ) 2.5 MG/0.5ML Pen, Inject 2.5 mg into the skin once a week., Disp: 2 mL, Rfl: 1   Vitamin D , Ergocalciferol , (DRISDOL ) 1.25 MG (50000 UNIT) CAPS capsule, Take 1 capsule (50,000 Units total) by mouth every 7 (seven) days., Disp: 5 capsule, Rfl: 0 No current facility-administered medications for this visit.  Facility-Administered Medications Ordered in Other Visits:    sodium chloride  0.9 % bolus 250 mL, 250 mL, Intravenous, Once, Armbruster, Elspeth SQUIBB, MD  "

## 2024-10-17 NOTE — Assessment & Plan Note (Signed)
 The patient has evidence of synovitis in MCPs and PIPs. She does also have osteophytes in DIPs. I suspect she has RA and OA. She has mild ulnar deviation. The digits of the hands are swollen, erythematous, and warm. The patient has morning stiffness and improvement in symptoms with motion. She has not seen rheumatology since the 90s. I provided referral to assess for RA. If these symptoms improve, she maybe considered for inhalers in the future.

## 2024-10-20 NOTE — Telephone Encounter (Signed)
 Inbound call from patient stating that she is returning a call back to [name]. Patient is requesting a call back. Please advise.

## 2024-10-23 NOTE — Telephone Encounter (Signed)
 Thank you :)

## 2024-10-30 ENCOUNTER — Telehealth (HOSPITAL_BASED_OUTPATIENT_CLINIC_OR_DEPARTMENT_OTHER): Payer: Self-pay

## 2024-10-30 NOTE — Telephone Encounter (Signed)
 Tried to reach out to pt was on the phone and got disconnected

## 2024-10-30 NOTE — Telephone Encounter (Signed)
 For the nebulizer medications she is supposed to use both of them: 1) Yupelri  nebulizer once daily  2) Brovana  nebulizer twice daily  As far as metoprolol , there's a lot of pulmonary patients that use metoprolol  and have asthma. There's a risk of increased bronchoconstriction with it given history of asthma. I would leave that decision between her and the provider who ordered the medication.  Please inform patient of the above.

## 2024-10-30 NOTE — Telephone Encounter (Signed)
 Copied from CRM [id number]. Topic: Clinical - Medical Advice >> Oct 30, 2024  9:18 AM Ismael A wrote: Reason for CRM: pt prescribed metoprolol  tartrate (LOPRESSOR ) 25 MG tablet, she is stating that she is not sure she wants to use this medication as she is concerned it could be fatal to her as she is not sure she has asthma, she states she did her research on this medication and wanted to speak with her provider before proceeding Patient is also wanting to discuss which nebulizer she is supposed to use, she states she received two nebulizers

## 2024-11-05 NOTE — Progress Notes (Unsigned)
 " Cardiology Office Note:    Date:  11/06/2024   ID:  Elizabeth Stone, DOB 02-10-1946, MRN [id number]  PCP:  Nanci Senior, MD   [facility] Providers Cardiologist:  Oneil Parchment, MD     Referring MD: Nanci Senior, MD   Chief complaint: Follow-up SVT and weakness     History of Present Illness:   Elizabeth Stone is a 79 y.o. female with a hx of HTN, HLD, GERD, prediabetes, CVA (cerebellar), deafness s/p cochlear implants, Mnire's disease, presenting today for follow-up of SVT and weakness.  Per notes, it appears patient has had severe vertigo issues since her 30s. Initially followed with Dr. Parchment in 2014 following a family history of heart disease.  Per Dr. Parchment 2014 note, there appeared to be an episode of SVT with spontaneous conversion during a remote stress test.  Repeat stress testing was performed in 2016 following dyspnea and diaphoresis, showing a hypertensive response to exercise without ischemia by ST analysis and a fair exercise tolerance.  She reported increasing weakness and DOE in 2020.  Echo LVEF greater than 65%,, no RWMA, no cardiomyopathy, no significant valvular abnormality.  Stress test demonstrating EF 73%, no ST deviation, normal/low risk study.  Strokelike symptoms in December 2023 presenting with gait abnormality, patient received IV TNK.  Subsequently lost to follow-up until 2024 where she presented to the ED for chest pressure, shortness of breath, neck tightness in November.  On EMS arrival patient was found to be in SVT and converted with 6 mg of adenosine which significantly improved her chest pain.Hemoglobin 11.6, labs are otherwise normal in the emergency department, chest x-ray unremarkable, discharged from emergency department for follow-up with cardiology. Started on metoprolol  succinate 25 mg daily.  Most recent follow-up with APP in October 2025, where she reported progressive weakness and heaviness in her legs over the last year/worsening over the  last 3 months.  Echo demonstrating LVEF 60-65%, normal LV function, no RWMA, normal diastolic parameters, normal RV, no significant valvular disease.  She presents today for follow-up of recent echo.  Presents with her son, Elizabeth Stone.  Appears stable from a cardiovascular standpoint.  Reports left axillary pain radiating into her left shoulder, occurring sporadically, none with exercise.  Denies chest pain. Goes to the gym 3-4 days a week where she walks, weight lifts, uses stationary bike.  States she can walk 1-2 blocks, however must stop and take a break due to SOB at completion.  Is due for follow-up with pulmonology for further workup of possible COPD.  SOB/DOE at baseline, did not use inhalers that were prescribed, wishes to speak with pulmonology as she has further questions about starting them.  Smoked 1 pack/week X 13 years, quit in 1992.  Does report episodic tachypalpitations occurring rarely, last episode in November while she was eating dinner.  She was confused regarding her metoprolol  prescription from her visit in October.  She never picked up her Lopressor , did not start Toprol -XL.  She was using Toprol -XL PRN tachycardia.  Reports episodic dizziness at baseline 2/2 Mnire's disease, stable, unchanged.  Denies near-syncope.  Is currently on oral iron  supplementation, reports dark stools with this.  Denies bloody stools, hematuria, weight changes.  Reports chronic bilateral lower extremity edema.  States her mobility issues are primarily limited due to significant, severe back pain.  Has upcoming spinal stimulator insertion scheduled for February 5th.  States her blood pressures are typically well-controlled at home, in the 120s-130s.  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  ROS:   Please see the history of present illness.    All other systems reviewed and are negative.     Past Medical History:  Diagnosis Date   Acute blood loss anemia    hx   Anxiety    Arthritis    Balance problem     Bipolar disorder (HCC)    Cochlear implant in place    Cochlear implant in place    right ear   Depression    Dermatitis    on face   Dyspnea    Fibromyalgia    GERD (gastroesophageal reflux disease)    Hard of hearing    Hearing aid worn    left ear   Hearing impaired    has cochlear implant   HTN (hypertension)    Hypokalemia    Meniere disease    takes HCTZ for her inner ear problem   Mixed hyperlipidemia 09/27/2013   Osteoporosis    Pneumonia 11/2016   Pre-diabetes    Primary localized osteoarthritis of left knee 11/16/2018   Primary localized osteoarthritis of right knee    Slow transit constipation    Unsteady gait     Past Surgical History:  Procedure Laterality Date   ABDOMINAL HYSTERECTOMY  1986   bladder tack  2007   COCHLEAR IMPLANT     2007   COLONOSCOPY     COLONOSCOPY     HAMMER TOE SURGERY  2014   left foot   REVERSE SHOULDER ARTHROPLASTY Right 12/13/2014   dr dozier   REVERSE SHOULDER ARTHROPLASTY Right 12/13/2014   Procedure: REVERSE SHOULDER ARTHROPLASTY rt;  Surgeon: Eva Elsie Dozier, MD;  Location: [facility] OR;  Service: Orthopedics;  Laterality: Right;  Right reverse total shoulder replacement   REVERSE SHOULDER ARTHROPLASTY Left 01/07/2017   Procedure: REVERSE LEFT TOTAL SHOULDER ARTHROPLASTY;  Surgeon: Eva Dozier, MD;  Location: [facility] OR;  Service: Orthopedics;  Laterality: Left;  Left reveres total shoulder arthroplasty   SPINAL CORD STIMULATOR INSERTION  2025   TOTAL KNEE ARTHROPLASTY Right 12/16/2015   Procedure: TOTAL KNEE ARTHROPLASTY;  Surgeon: Lamar Millman, MD;  Location: [facility] OR;  Service: Orthopedics;  Laterality: Right;   TOTAL KNEE ARTHROPLASTY Left 11/28/2018   Procedure: LEFT TOTAL KNEE ARTHROPLASTY;  Surgeon: Millman Lamar, MD;  Location: [facility] OR;  Service: Orthopedics;  Laterality: Left;    Current Medications: Active Medications[1]   Allergies:   Latex and Adhesive [tape]   Social History   Socioeconomic History    Marital status: Widowed    Spouse name: Not on file   Number of children: 2   Years of education: college   Highest education level: Not on file  Occupational History   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   Right-handed.   Rare caffeine use.   Social Drivers of Health   Tobacco  Use: Medium Risk (11/06/2024)   Patient History    Smoking Tobacco Use: Former    Smokeless Tobacco Use: Never    Passive Exposure: Not on Actuary Strain: Not on file  Food Insecurity: No Food Insecurity (10/09/2022)   Hunger Vital Sign    Worried About Running Out of Food in the Last Year: Never true    Ran Out of Food in the Last Year: Never true  Transportation Needs: Unmet Transportation Needs (10/09/2022)   PRAPARE - Administrator, Civil Service (Medical): Yes    Lack of Transportation (Non-Medical): Yes  Physical Activity: Not on file  Stress: Not on file  Social Connections: Unknown (03/02/2022)   Received from [facility]   Social Network    Social Network: Not on file  Depression (PHQ2-9): Low Risk (03/08/2024)   Depression (PHQ2-9)    PHQ-2 Score: 0  Alcohol Screen: Not on file  Housing: Low Risk (10/09/2022)   Housing    Last Housing Risk Score: 0  Utilities: Not At Risk (10/09/2022)   AHC Utilities    Threatened with loss of utilities: No  Health Literacy: Not on file     Family History: The patient's family history includes Breast cancer in her  mother; Cervical cancer in her mother; Diabetes in her maternal aunt and mother; Heart disease in her father and mother. There is no history of Colon cancer, Esophageal cancer, Rectal cancer, Stomach cancer, Liver cancer, or Pancreatic cancer.  EKGs/Labs/Other Studies Reviewed:    The following studies were reviewed today:       Recent Labs: 08/08/2024: BUN 10; Creatinine, Ser 0.70; Magnesium  1.7; Potassium 3.5; Sodium 139 10/09/2024: ALT 24 10/13/2024: Hemoglobin 11.4; Platelets 149.0  Recent Lipid Panel    Component Value Date/Time   CHOL 135 05/09/2024 1346   TRIG 120.0 05/09/2024 1346   HDL 55.00 05/09/2024 1346   CHOLHDL 2 05/09/2024 1346   VLDL 24.0 05/09/2024 1346   LDLCALC 56 05/09/2024 1346     Risk Assessment/Calculations:      HYPERTENSION CONTROL Vitals:   11/06/24 1516 11/06/24 1640  BP: (!) 167/72 (!) 152/74    The patient's blood pressure is elevated above target today.  In order to address the patient's elevated BP: Blood pressure will be monitored at home to determine if medication changes need to be made.; A new medication was prescribed today.            Physical Exam:    VS:  BP (!) 152/74 (BP Location: Right Arm)   Pulse 97   Ht 5' 1 (1.549 m)   Wt 167 lb (75.8 kg)   LMP  (LMP Unknown)   SpO2 92%   BMI 31.55 kg/m        Wt Readings from Last 3 Encounters:  11/06/24 167 lb (75.8 kg)  10/17/24 160 lb (72.6 kg)  10/13/24 175 lb 4 oz (79.5 kg)     GEN: Well nourished, well developed in no acute distress HEENT: Normal NECK:  No carotid bruits CARDIAC:  S1-S2 normal, RRR, no murmurs, rubs, gallops RESPIRATORY:  Clear to auscultation without rales, wheezing or rhonchi  EXTREMITIES:  +1 bilateral lower extremity pitting edema; No deformity  SKIN: Warm, dry     Assessment & Plan SVT (supraventricular tachycardia) Reports rare occurrences of tachypalpitations, last episode in November Was confused regarding medication administration  instructions, was taking Toprol -XL as needed for tachycardia, never picked up Lopressor . Will start Toprol -XL 25 mg daily  as previously prescribed, clarified instructions for Lopressor , will send in refill for this today.  Take Lopressor  12.5 mg as needed for tachypalpitations. Instructed to continue to monitor for symptoms and notify the office if they recur. If they increase in frequency despite appropriate medical therapy, would consider repeating ZIO to evaluate ectopy burden. Bilateral lower extremity edema LVEF 60-65%, refer no RWMA, normal diastolic parameters, normal RV, no significant valvular disease. +1 bilateral lower extremity pitting edema on exam, patient states is mildly worse than her baseline. Will have patient take extra dose of Lasix x 2 days (40 mg x 2 days) to help improve swelling Will repeat BMP for baseline kidney function and potassium today as well as a BNP, will repeat BMP in 1 week following increase in Lasix.  Return to Lasix 20 mg daily dosing after 2 days.   Continue to monitor and contact the office if swelling persists or worsen following increase in diuretic. Primary hypertension Amlodipine  stopped at prior visit. Patient never started Toprol -XL. BPs elevated in office today. Patient states normally 120s-130s systolic at home. Start Toprol -XL as prescribed previously at 25 mg by mouth daily Continue losartan  100 mg daily Continue Lasix 20 mg daily Notify the office if pressures continue to remain elevated following start of Toprol -XL  Disposition: Patient requested to follow-up with Dr. Jeffrie, as she has not seen him recently.  Follow-up with Dr. Jeffrie in 3-4 months, or sooner if needed.  Proceed to the ED with any new or worsening symptoms.            Medication Adjustments/Labs and Tests Ordered: Current medicines are reviewed at length with the patient today.  Concerns regarding medicines are outlined above.  Orders Placed This Encounter   Procedures   Basic Metabolic Panel (BMET)   B Nat Peptide   Basic Metabolic Panel (BMET)   Meds ordered this encounter  Medications   metoprolol  tartrate (LOPRESSOR ) 25 MG tablet    Sig: Take 1/2 tablets (12.5 mg total) by mouth daily as needed (palpitations or fast heart beat).    Dispense:  90 tablet    Refill:  0    Supervising Provider:   PATWARDHAN, MANISH J [[id number]]    Patient Instructions  Medication Instructions:  CONTRINUE taking your Metoprolol  SUCCINATE (Toprol  XL) 25 mg. Take one (1) tablet by mouth once daily.  START taking Metoprolol  TARTRATE (Lopressor ) 25 mg. Take one-half (0.5) tablet as needed for rapid heart rate.   INCREASE Furosemide (Lasix). Take one (1) additional tablet once daily for the next two (2) days.  *If you need a refill on your cardiac medications before your next appointment, please call your pharmacy*  Lab Work: BMP and BNP today  REPEAT BMP in one week If you have labs (blood work) drawn today and your tests are completely normal, you will receive your results only by: MyChart Message (if you have MyChart) OR A paper copy in the mail If you have any lab test that is abnormal or we need to change your treatment, we will call you to review the results.  Testing/Procedures: None ordered  Follow-Up: At [facility], you and your health needs are our priority.  As part of our continuing mission to provide you with exceptional heart care, our providers are all part of one team.  This team includes your primary Cardiologist (physician) and Advanced Practice Providers or APPs (Physician Assistants and Nurse Practitioners) who all work together to provide you with the care you need, when you  need it.  Your next appointment:   3 month(s)  Provider:   Oneil Parchment, MD    We recommend signing up for the patient portal called MyChart.  Sign up information is provided on this After Visit Summary.  MyChart is used to connect with patients  for Virtual Visits (Telemedicine).  Patients are able to view lab/test results, encounter notes, upcoming appointments, etc.  Non-urgent messages can be sent to your provider as well.   To learn more about what you can do with MyChart, go to [email or web address].   Other Instructions  IF YOUR HEART RATE DOESN'T IMPROVE WITH YOUR METOPROLOL  TARTRATE AS NEEDED DOSE, GO TO THE EMERGENCY DEPARTMENT.             Signed, Kesha Hurrell E Othman Masur, NP  11/06/2024 4:43 PM    [facility]     [1]  Current Meds  Medication Sig   acetaminophen  (TYLENOL ) 325 MG tablet Take 1 tablet (325 mg total) by mouth every 4 (four) hours as needed for mild pain (or temp > 37.5 C (99.5 F)).   albuterol  (ACCUNEB ) 1.25 MG/3ML nebulizer solution Take 3 mLs (1.25 mg total) by nebulization every 6 (six) hours as needed for wheezing.   albuterol  (VENTOLIN  HFA) 108 (90 Base) MCG/ACT inhaler Inhale 1-2 puffs into the lungs every 6 (six) hours as needed.   alendronate (FOSAMAX) 70 MG tablet Take 70 mg by mouth once a week.   arformoterol  (BROVANA ) 15 MCG/2ML NEBU Take 2 mLs (15 mcg total) by nebulization 2 (two) times daily.   Ascorbic Acid  (VITAMIN C  ER PO) Vitamin C    Asenapine  Maleate 10 MG SUBL Place 1 tablet under the tongue at bedtime.   aspirin  EC 81 MG tablet Take 1 tablet (81 mg total) by mouth daily. Swallow whole.   atorvastatin  (LIPITOR) 20 MG tablet Take 1 tablet (20 mg total) by mouth at bedtime.   B Complex-C (B-COMPLEX WITH VITAMIN C ) tablet Take 1 tablet by mouth daily.   buPROPion  (WELLBUTRIN  XL) 300 MG 24 hr tablet Take 1 tablet (300 mg total) by mouth daily.   cetirizine (ZYRTEC) 10 MG chewable tablet Chew 10 mg by mouth at bedtime.   clonazePAM  (KLONOPIN ) 1 MG tablet Take 1 tablet (1 mg total) by mouth at bedtime.   Dermatological Products, Misc. (DERMAREST ROSACEA EX)    DULoxetine  (CYMBALTA ) 60 MG capsule Take 1 capsule (60 mg total) by mouth 2 (two) times daily.   esomeprazole  (NEXIUM )  20 MG capsule 1 capsule.   FLUZONE HIGH-DOSE 0.5 ML injection    furosemide (LASIX) 20 MG tablet Take 20 mg by mouth daily.   gabapentin  (NEURONTIN ) 300 MG capsule Take 1 capsule (300 mg total) by mouth at bedtime.   HYDROcodone -acetaminophen  (NORCO) 10-325 MG tablet Take 1 tablet by mouth every 12 (twelve) hours as needed for severe pain.   lamoTRIgine  (LAMICTAL ) 200 MG tablet Take by mouth at bedtime.   losartan  (COZAAR ) 100 MG tablet Take 1 tablet (100 mg total) by mouth daily.   metoprolol  succinate (TOPROL -XL) 25 MG 24 hr tablet Take 1 tablet (25 mg total) by mouth daily.   montelukast  (SINGULAIR ) 10 MG tablet Take 1 tablet (10 mg total) by mouth at bedtime.   potassium chloride  SA (KLOR-CON  M) 20 MEQ tablet Take 20 mEq by mouth daily.   revefenacin  (YUPELRI ) 175 MCG/3ML nebulizer solution Take 3 mLs (175 mcg total) by nebulization daily.   tirzepatide  (MOUNJARO ) 2.5 MG/0.5ML Pen Inject 2.5 mg into the skin once a  week.   tiZANidine (ZANAFLEX) 4 MG tablet Take 4 mg by mouth every 6 (six) hours as needed.   valACYclovir (VALTREX) 1000 MG tablet 1 tablet Orally twice a day 7 days as needed; Duration: 7 days   Vitamin D , Ergocalciferol , (DRISDOL ) 1.25 MG (50000 UNIT) CAPS capsule Take 1 capsule (50,000 Units total) by mouth every 7 (seven) days.   [DISCONTINUED] metoprolol  tartrate (LOPRESSOR ) 25 MG tablet Take 0.5 tablets (12.5 mg total) by mouth daily as needed (palpitations or fast heart beat).   "

## 2024-11-06 ENCOUNTER — Ambulatory Visit: Attending: Emergency Medicine | Admitting: Emergency Medicine

## 2024-11-06 ENCOUNTER — Telehealth: Payer: Self-pay

## 2024-11-06 ENCOUNTER — Other Ambulatory Visit (HOSPITAL_COMMUNITY): Payer: Self-pay

## 2024-11-06 ENCOUNTER — Encounter: Payer: Self-pay | Admitting: Emergency Medicine

## 2024-11-06 VITALS — BP 152/74 | HR 97 | Ht 61.0 in | Wt 167.0 lb

## 2024-11-06 DIAGNOSIS — I10 Essential (primary) hypertension: Secondary | ICD-10-CM

## 2024-11-06 DIAGNOSIS — I471 Supraventricular tachycardia, unspecified: Secondary | ICD-10-CM

## 2024-11-06 DIAGNOSIS — R600 Localized edema: Secondary | ICD-10-CM

## 2024-11-06 MED ORDER — METOPROLOL TARTRATE 25 MG PO TABS
12.5000 mg | ORAL_TABLET | Freq: Every day | ORAL | 0 refills | Status: AC | PRN
  Filled 2024-11-06: qty 45, 90d supply, fill #0

## 2024-11-06 NOTE — Telephone Encounter (Signed)
 LMOM advising patient appointment on 11/27/24 with Daved Holstein has been cancelled.   Unfortunately, there are no new patient appointments available on 12/07/24 and requested she call back to reschedule.

## 2024-11-06 NOTE — Patient Instructions (Signed)
 Medication Instructions:  CONTRINUE taking your Metoprolol  SUCCINATE (Toprol  XL) 25 mg. Take one (1) tablet by mouth once daily.  START taking Metoprolol  TARTRATE (Lopressor ) 25 mg. Take one-half (0.5) tablet as needed for rapid heart rate.   INCREASE Furosemide (Lasix). Take one (1) additional tablet once daily for the next two (2) days.  *If you need a refill on your cardiac medications before your next appointment, please call your pharmacy*  Lab Work: BMP and BNP today  REPEAT BMP in one week If you have labs (blood work) drawn today and your tests are completely normal, you will receive your results only by: MyChart Message (if you have MyChart) OR A paper copy in the mail If you have any lab test that is abnormal or we need to change your treatment, we will call you to review the results.  Testing/Procedures: None ordered  Follow-Up: At [facility], you and your health needs are our priority.  As part of our continuing mission to provide you with exceptional heart care, our providers are all part of one team.  This team includes your primary Cardiologist (physician) and Advanced Practice Providers or APPs (Physician Assistants and Nurse Practitioners) who all work together to provide you with the care you need, when you need it.  Your next appointment:   3 month(s)  Provider:   Oneil Parchment, MD    We recommend signing up for the patient portal called MyChart.  Sign up information is provided on this After Visit Summary.  MyChart is used to connect with patients for Virtual Visits (Telemedicine).  Patients are able to view lab/test results, encounter notes, upcoming appointments, etc.  Non-urgent messages can be sent to your provider as well.   To learn more about what you can do with MyChart, go to [email or web address].   Other Instructions  IF YOUR HEART RATE DOESN'T IMPROVE WITH YOUR METOPROLOL  TARTRATE AS NEEDED DOSE, GO TO THE EMERGENCY DEPARTMENT.

## 2024-11-06 NOTE — Telephone Encounter (Signed)
-----   Message from Nurse Alfonso DEL sent at 11/06/2024 12:44 PM EST ----- Patient's son [name] Dayhoff called on behalf of patient and left message on nurse line on 11/03/2024. He advised Rey has an appointment scheduled on 11/27/2024 and they need to reschedule the appointment. Patient has a surgery scheduled for 11/23/2024 and will not be able to leave the house yet when she is schedule for her appointment here. They would like to see if they can reschedule for December 07, 2024 if possible. Please contact the patient at [phone].

## 2024-11-07 NOTE — Telephone Encounter (Signed)
 Pt has appt on 11/13/2024

## 2024-11-09 ENCOUNTER — Ambulatory Visit: Payer: Self-pay | Admitting: Emergency Medicine

## 2024-11-09 LAB — BASIC METABOLIC PANEL WITH GFR
BUN/Creatinine Ratio: 13 (ref 12–28)
BUN: 9 mg/dL (ref 8–27)
CO2: 22 mmol/L (ref 20–29)
Calcium: 9.9 mg/dL (ref 8.7–10.3)
Chloride: 96 mmol/L (ref 96–106)
Creatinine, Ser: 0.7 mg/dL (ref 0.57–1.00)
Glucose: 128 mg/dL — ABNORMAL HIGH (ref 70–99)
Potassium: 4.2 mmol/L (ref 3.5–5.2)
Sodium: 134 mmol/L (ref 134–144)
eGFR: 90 mL/min/1.73

## 2024-11-09 LAB — BRAIN NATRIURETIC PEPTIDE: BNP: 13.8 pg/mL (ref 0.0–100.0)

## 2024-11-10 ENCOUNTER — Ambulatory Visit: Payer: Self-pay

## 2024-11-10 ENCOUNTER — Other Ambulatory Visit (HOSPITAL_BASED_OUTPATIENT_CLINIC_OR_DEPARTMENT_OTHER): Payer: Self-pay

## 2024-11-10 ENCOUNTER — Encounter (HOSPITAL_BASED_OUTPATIENT_CLINIC_OR_DEPARTMENT_OTHER): Payer: Self-pay

## 2024-11-10 MED ORDER — ALBUTEROL SULFATE HFA 108 (90 BASE) MCG/ACT IN AERS: 1.0000 | INHALATION_SPRAY | Freq: Four times a day (QID) | RESPIRATORY_TRACT | 2 refills | Status: AC | PRN

## 2024-11-10 NOTE — Telephone Encounter (Signed)
 FYI Only or Action Required?: Action required by provider: request for appointment.  Patient was last seen in primary care on .  Called Nurse Triage reporting Cough.  Symptoms began a week ago.  Interventions attempted: Prescription medications:  SABRA  Symptoms are: unchanged.  Triage Disposition: See HCP Within 4 Hours (Or PCP Triage)  Patient/caregiver understands and will follow disposition?: No, wishes to speak with PCP     Reason for Triage: pt states she is having trouble breathing, states her inhaler is not working for her, also reports weakness and lethargy, worsening wheezing, non productive cough, chest tightness  - symptoms have been worsening   Reason for Disposition  [1] MILD difficulty breathing (e.g., minimal/no SOB at rest, SOB with walking, pulse < 100) AND [2] still present when not coughing  Answer Assessment - Initial Assessment Questions 1. ONSET: When did the cough begin?      This week 2. SEVERITY: How bad is the cough today?      severe 3. SPUTUM: Describe the color of your sputum (e.g., none, dry cough; clear, white, yellow, green)     no 4. HEMOPTYSIS: Are you coughing up any blood? If Yes, ask: How much? (e.g., flecks, streaks, tablespoons, etc.)     no 5. DIFFICULTY BREATHING: Are you having difficulty breathing? If Yes, ask: How bad is it? (e.g., mild, moderate, severe)      yes 6. FEVER: Do you have a fever? If Yes, ask: What is your temperature, how was it measured, and when did it start?     no 7. CARDIAC HISTORY: Do you have any history of heart disease? (e.g., heart attack, congestive heart failure)      no 8. LUNG HISTORY: Do you have any history of lung disease?  (e.g., pulmonary embolus, asthma, emphysema)     yes 9. PE RISK FACTORS: Do you have a history of blood clots? (or: recent major surgery, recent prolonged travel, bedridden)     no 10. OTHER SYMPTOMS: Do you have any other symptoms? (e.g., runny nose,  wheezing, chest pain)       Wheezing, chest tightness 11. PREGNANCY: Is there any chance you are pregnant? When was your last menstrual period?       no 12. TRAVEL: Have you traveled out of the country in the last month? (e.g., travel history, exposures)       no  Protocols used: Cough - Acute Non-Productive-A-AH

## 2024-11-10 NOTE — Telephone Encounter (Signed)
 Noted for appt and rx sent to pharmacy

## 2024-11-13 ENCOUNTER — Ambulatory Visit (HOSPITAL_BASED_OUTPATIENT_CLINIC_OR_DEPARTMENT_OTHER): Admitting: Pulmonary Disease

## 2024-11-17 ENCOUNTER — Ambulatory Visit (HOSPITAL_BASED_OUTPATIENT_CLINIC_OR_DEPARTMENT_OTHER): Admitting: Pulmonary Disease

## 2024-11-23 ENCOUNTER — Encounter: Payer: Self-pay | Admitting: Gastroenterology

## 2024-11-27 ENCOUNTER — Ambulatory Visit

## 2024-12-06 ENCOUNTER — Ambulatory Visit (HOSPITAL_BASED_OUTPATIENT_CLINIC_OR_DEPARTMENT_OTHER): Admitting: Pulmonary Disease

## 2025-01-03 ENCOUNTER — Ambulatory Visit (HOSPITAL_BASED_OUTPATIENT_CLINIC_OR_DEPARTMENT_OTHER): Admitting: Pulmonary Disease

## 2025-01-03 ENCOUNTER — Encounter (HOSPITAL_BASED_OUTPATIENT_CLINIC_OR_DEPARTMENT_OTHER)

## 2025-01-12 ENCOUNTER — Ambulatory Visit: Admitting: Cardiology

## 2025-02-02 ENCOUNTER — Encounter

## 2025-02-05 ENCOUNTER — Ambulatory Visit: Admitting: Cardiology

## 2025-02-16 ENCOUNTER — Encounter: Admitting: Gastroenterology
